# Patient Record
Sex: Male | Born: 1945 | Race: White | Hispanic: No | Marital: Married | Smoking: Former smoker
Health system: Southern US, Community
[De-identification: ages and names within clinical notes are randomized; demographics above are authoritative.]

## PROBLEM LIST (undated history)

## (undated) DIAGNOSIS — F419 Anxiety disorder, unspecified: Secondary | ICD-10-CM

## (undated) DIAGNOSIS — Z8616 Personal history of COVID-19: Secondary | ICD-10-CM

## (undated) DIAGNOSIS — K4090 Unilateral inguinal hernia, without obstruction or gangrene, not specified as recurrent: Secondary | ICD-10-CM

## (undated) DIAGNOSIS — I440 Atrioventricular block, first degree: Secondary | ICD-10-CM

## (undated) DIAGNOSIS — D539 Nutritional anemia, unspecified: Secondary | ICD-10-CM

## (undated) DIAGNOSIS — R9431 Abnormal electrocardiogram [ECG] [EKG]: Secondary | ICD-10-CM

## (undated) DIAGNOSIS — K219 Gastro-esophageal reflux disease without esophagitis: Secondary | ICD-10-CM

## (undated) DIAGNOSIS — M109 Gout, unspecified: Secondary | ICD-10-CM

## (undated) DIAGNOSIS — I779 Disorder of arteries and arterioles, unspecified: Secondary | ICD-10-CM

## (undated) DIAGNOSIS — J309 Allergic rhinitis, unspecified: Secondary | ICD-10-CM

## (undated) DIAGNOSIS — F32A Depression, unspecified: Secondary | ICD-10-CM

## (undated) DIAGNOSIS — K649 Unspecified hemorrhoids: Secondary | ICD-10-CM

## (undated) DIAGNOSIS — Z9289 Personal history of other medical treatment: Secondary | ICD-10-CM

## (undated) DIAGNOSIS — R0600 Dyspnea, unspecified: Secondary | ICD-10-CM

## (undated) DIAGNOSIS — M1990 Unspecified osteoarthritis, unspecified site: Secondary | ICD-10-CM

## (undated) DIAGNOSIS — K449 Diaphragmatic hernia without obstruction or gangrene: Secondary | ICD-10-CM

## (undated) DIAGNOSIS — R7989 Other specified abnormal findings of blood chemistry: Secondary | ICD-10-CM

## (undated) DIAGNOSIS — T7840XA Allergy, unspecified, initial encounter: Secondary | ICD-10-CM

## (undated) DIAGNOSIS — R002 Palpitations: Secondary | ICD-10-CM

## (undated) DIAGNOSIS — I480 Paroxysmal atrial fibrillation: Secondary | ICD-10-CM

## (undated) DIAGNOSIS — R0609 Other forms of dyspnea: Secondary | ICD-10-CM

## (undated) DIAGNOSIS — F1010 Alcohol abuse, uncomplicated: Secondary | ICD-10-CM

## (undated) DIAGNOSIS — I10 Essential (primary) hypertension: Secondary | ICD-10-CM

## (undated) DIAGNOSIS — N400 Enlarged prostate without lower urinary tract symptoms: Secondary | ICD-10-CM

## (undated) DIAGNOSIS — I2510 Atherosclerotic heart disease of native coronary artery without angina pectoris: Secondary | ICD-10-CM

## (undated) DIAGNOSIS — R740 Nonspecific elevation of levels of transaminase and lactic acid dehydrogenase [LDH]: Secondary | ICD-10-CM

## (undated) DIAGNOSIS — H269 Unspecified cataract: Secondary | ICD-10-CM

## (undated) DIAGNOSIS — K5790 Diverticulosis of intestine, part unspecified, without perforation or abscess without bleeding: Secondary | ICD-10-CM

## (undated) DIAGNOSIS — F4310 Post-traumatic stress disorder, unspecified: Secondary | ICD-10-CM

## (undated) DIAGNOSIS — F329 Major depressive disorder, single episode, unspecified: Secondary | ICD-10-CM

## (undated) DIAGNOSIS — R7401 Elevation of levels of liver transaminase levels: Secondary | ICD-10-CM

## (undated) DIAGNOSIS — R195 Other fecal abnormalities: Secondary | ICD-10-CM

## (undated) HISTORY — DX: Other forms of dyspnea: R06.09

## (undated) HISTORY — DX: Gout, unspecified: M10.9

## (undated) HISTORY — DX: Essential (primary) hypertension: I10

## (undated) HISTORY — DX: Post-traumatic stress disorder, unspecified: F43.10

## (undated) HISTORY — PX: ESOPHAGOGASTRODUODENOSCOPY: SHX1529

## (undated) HISTORY — DX: Palpitations: R00.2

## (undated) HISTORY — PX: EYE SURGERY: SHX253

## (undated) HISTORY — DX: Depression, unspecified: F32.A

## (undated) HISTORY — PX: KNEE ARTHROSCOPY: SUR90

## (undated) HISTORY — DX: Paroxysmal atrial fibrillation: I48.0

## (undated) HISTORY — DX: Nonspecific elevation of levels of transaminase and lactic acid dehydrogenase (ldh): R74.0

## (undated) HISTORY — PX: LAPAROSCOPIC CHOLECYSTECTOMY: SUR755

## (undated) HISTORY — DX: Unspecified hemorrhoids: K64.9

## (undated) HISTORY — DX: Diaphragmatic hernia without obstruction or gangrene: K44.9

## (undated) HISTORY — DX: Personal history of other medical treatment: Z92.89

## (undated) HISTORY — DX: Elevation of levels of liver transaminase levels: R74.01

## (undated) HISTORY — DX: Nutritional anemia, unspecified: D53.9

## (undated) HISTORY — DX: Gastro-esophageal reflux disease without esophagitis: K21.9

## (undated) HISTORY — DX: Anxiety disorder, unspecified: F41.9

## (undated) HISTORY — DX: Abnormal electrocardiogram (ECG) (EKG): R94.31

## (undated) HISTORY — PX: HERNIA REPAIR: SHX51

## (undated) HISTORY — DX: Unspecified cataract: H26.9

## (undated) HISTORY — DX: Alcohol abuse, uncomplicated: F10.10

## (undated) HISTORY — DX: Dyspnea, unspecified: R06.00

## (undated) HISTORY — DX: Other specified abnormal findings of blood chemistry: R79.89

## (undated) HISTORY — DX: Allergy, unspecified, initial encounter: T78.40XA

## (undated) HISTORY — DX: Diverticulosis of intestine, part unspecified, without perforation or abscess without bleeding: K57.90

## (undated) HISTORY — DX: Disorder of arteries and arterioles, unspecified: I77.9

## (undated) HISTORY — DX: Allergic rhinitis, unspecified: J30.9

## (undated) HISTORY — DX: Other fecal abnormalities: R19.5

## (undated) HISTORY — DX: Major depressive disorder, single episode, unspecified: F32.9

## (undated) HISTORY — PX: KNEE SURGERY: SHX244

---

## 2000-02-09 ENCOUNTER — Other Ambulatory Visit: Admission: RE | Admit: 2000-02-09 | Discharge: 2000-02-09 | Payer: Self-pay | Admitting: Urology

## 2002-10-21 ENCOUNTER — Encounter: Payer: Self-pay | Admitting: Gastroenterology

## 2002-11-26 ENCOUNTER — Encounter: Payer: Self-pay | Admitting: Gastroenterology

## 2002-12-12 HISTORY — PX: CHOLECYSTECTOMY: SHX55

## 2005-08-29 ENCOUNTER — Ambulatory Visit (HOSPITAL_COMMUNITY): Admission: RE | Admit: 2005-08-29 | Discharge: 2005-08-29 | Payer: Self-pay | Admitting: Family Medicine

## 2005-08-31 ENCOUNTER — Encounter (INDEPENDENT_AMBULATORY_CARE_PROVIDER_SITE_OTHER): Payer: Self-pay | Admitting: *Deleted

## 2005-08-31 ENCOUNTER — Encounter (INDEPENDENT_AMBULATORY_CARE_PROVIDER_SITE_OTHER): Payer: Self-pay | Admitting: Specialist

## 2005-08-31 ENCOUNTER — Ambulatory Visit (HOSPITAL_COMMUNITY): Admission: RE | Admit: 2005-08-31 | Discharge: 2005-08-31 | Payer: Self-pay | Admitting: Surgery

## 2005-12-30 ENCOUNTER — Emergency Department (HOSPITAL_COMMUNITY): Admission: EM | Admit: 2005-12-30 | Discharge: 2005-12-31 | Payer: Self-pay | Admitting: Emergency Medicine

## 2007-01-25 ENCOUNTER — Encounter: Admission: RE | Admit: 2007-01-25 | Discharge: 2007-01-25 | Payer: Self-pay | Admitting: Orthopedic Surgery

## 2007-03-08 ENCOUNTER — Encounter: Admission: RE | Admit: 2007-03-08 | Discharge: 2007-04-10 | Payer: Self-pay | Admitting: Orthopedic Surgery

## 2007-10-29 ENCOUNTER — Encounter: Admission: RE | Admit: 2007-10-29 | Discharge: 2007-12-03 | Payer: Self-pay | Admitting: Family Medicine

## 2009-04-11 ENCOUNTER — Emergency Department (HOSPITAL_COMMUNITY): Admission: EM | Admit: 2009-04-11 | Discharge: 2009-04-11 | Payer: Self-pay | Admitting: Emergency Medicine

## 2009-10-15 ENCOUNTER — Encounter (INDEPENDENT_AMBULATORY_CARE_PROVIDER_SITE_OTHER): Payer: Self-pay | Admitting: *Deleted

## 2010-06-18 ENCOUNTER — Encounter (INDEPENDENT_AMBULATORY_CARE_PROVIDER_SITE_OTHER): Payer: Self-pay | Admitting: *Deleted

## 2010-08-09 ENCOUNTER — Ambulatory Visit: Payer: Self-pay | Admitting: Gastroenterology

## 2010-08-09 DIAGNOSIS — R195 Other fecal abnormalities: Secondary | ICD-10-CM

## 2010-08-09 DIAGNOSIS — D539 Nutritional anemia, unspecified: Secondary | ICD-10-CM | POA: Insufficient documentation

## 2010-08-09 DIAGNOSIS — R740 Nonspecific elevation of levels of transaminase and lactic acid dehydrogenase [LDH]: Secondary | ICD-10-CM

## 2010-08-09 LAB — CONVERTED CEMR LAB
ALT: 54 units/L — ABNORMAL HIGH (ref 0–53)
AST: 53 units/L — ABNORMAL HIGH (ref 0–37)
Albumin: 4.3 g/dL (ref 3.5–5.2)
Bilirubin, Direct: 0.2 mg/dL (ref 0.0–0.3)
INR: 1.1 — ABNORMAL HIGH (ref 0.8–1.0)
Total Protein: 7.3 g/dL (ref 6.0–8.3)

## 2010-08-12 ENCOUNTER — Telehealth: Payer: Self-pay | Admitting: Gastroenterology

## 2010-08-12 ENCOUNTER — Ambulatory Visit (HOSPITAL_COMMUNITY)
Admission: RE | Admit: 2010-08-12 | Discharge: 2010-08-12 | Payer: Self-pay | Source: Home / Self Care | Admitting: Gastroenterology

## 2010-08-12 LAB — HM COLONOSCOPY: HM Colonoscopy: ABNORMAL

## 2010-08-31 ENCOUNTER — Telehealth: Payer: Self-pay | Admitting: Gastroenterology

## 2010-09-07 ENCOUNTER — Ambulatory Visit: Payer: Self-pay | Admitting: Gastroenterology

## 2010-09-14 ENCOUNTER — Encounter: Payer: Self-pay | Admitting: Gastroenterology

## 2010-11-01 ENCOUNTER — Encounter
Admission: RE | Admit: 2010-11-01 | Discharge: 2010-12-09 | Payer: Self-pay | Source: Home / Self Care | Attending: Orthopedic Surgery | Admitting: Orthopedic Surgery

## 2010-12-16 ENCOUNTER — Encounter
Admission: RE | Admit: 2010-12-16 | Discharge: 2011-01-11 | Payer: Self-pay | Source: Home / Self Care | Attending: Orthopedic Surgery | Admitting: Orthopedic Surgery

## 2011-01-11 NOTE — Miscellaneous (Signed)
Summary: gi medication  Clinical Lists Changes  Medications: Added new medication of OMEPRAZOLE 20 MG  CPDR (OMEPRAZOLE) 1 each day 30 minutes before meal - Signed Rx of OMEPRAZOLE 20 MG  CPDR (OMEPRAZOLE) 1 each day 30 minutes before meal;  #30 x 11;  Signed;  Entered by: Eual Fines RN;  Authorized by: Meryl Dare MD [facility];  Method used: Electronically to [facility][id number]*, [address] [place], [place], [place]  [address], Ph: [phone] or [phone], Fax: [phone] Observations: Added new observation of ALLERGY REV: Done (09/07/2010 14:52)    Prescriptions: OMEPRAZOLE 20 MG  CPDR (OMEPRAZOLE) 1 each day 30 minutes before meal  #30 x 11   Entered by:   Eual Fines RN   Authorized by:   Meryl Dare MD [facility]   Signed by:   Eual Fines RN on 09/07/2010   Method used:   Electronically to        [facility] [phone]* (retail)       [address] [place], [place]  [address]       Ph: [phone] or [phone]       Fax: [phone]   RxID:   [id number]

## 2011-01-11 NOTE — Procedures (Signed)
Summary: Colonoscopy  Patient: Joseph Larsen Note: All result statuses are Final unless otherwise noted.  Tests: (1) Colonoscopy (COL)   COL Colonoscopy           DONE     [facility]     [address]     [place], [place]  [address]           COLONOSCOPY PROCEDURE REPORT     PATIENT:  [name], [name]  MR#:  [id number]     BIRTHDATE:  1946-05-02, 64 yrs. old  GENDER:  male     ENDOSCOPIST:  Judie Petit T. Russella Dar, MD, [facility]           PROCEDURE DATE:  09/07/2010     PROCEDURE:  Colonoscopy 213[address]     ASA CLASS:  Class II     INDICATIONS:  1) heme positive stool     MEDICATIONS:   Fentanyl 100 mcg IV, Versed 9 mg IV     DESCRIPTION OF PROCEDURE:   After the risks benefits and     alternatives of the procedure were thoroughly explained, informed     consent was obtained.  Digital rectal exam was performed and     revealed no abnormalities.   The LB [id number]-[id number] endoscope     was introduced through the anus and advanced to the cecum, which     was identified by both the appendix and ileocecal valve, without     limitations.  The quality of the prep was excellent, using     MoviPrep.  The instrument was then slowly withdrawn as the colon     was fully examined.     <<PROCEDUREIMAGES>>     FINDINGS:  Mild diverticulosis was found in the ascending colon.     Moderate diverticulosis was found in the sigmoid colon. A normal     appearing cecum, ileocecal valve, and appendiceal orifice were     identified. The hepatic flexure, transverse, splenic flexure,     descending colon, and rectum appeared unremarkable.   Retroflexed     views in the rectum revealed internal hemorrhoids, small.  The     time to cecum =  2  minutes. The scope was then withdrawn (time =     8.67  min) from the patient and the procedure completed.     COMPLICATIONS:  None           ENDOSCOPIC IMPRESSION:     1) Mild diverticulosis in the ascending colon     2) Moderate diverticulosis in the sigmoid colon     3)  Internal hemorrhoids           RECOMMENDATIONS:     1) High fiber diet with liberal fluid intake.     2) Continue current colorectal screening for "routine risk"     patients with a repeat colonoscopy in 10 years.           Venita Lick. Russella Dar, MD, [facility]           CC: Rudi Heap, MD           n.     Rosalie DoctorVenita Lick. Stark at 09/07/2010 02:21 PM           Aquilla Hacker, [phone]  Note: An exclamation mark (!) indicates a result that was not dispersed into the flowsheet. Document Creation Date: 09/07/2010 2:22 PM _______________________________________________________________________  (1) Order result status: Final Collection or observation date-time: 09/07/2010 14:18 Requested date-time:  Receipt date-time:  Reported date-time:  Referring Physician:   Ordering Physician: Claudette Head [phone]) Specimen Source:  Source: Launa Grill Order Number: [id number] Lab site:   Appended Document: Colonoscopy    Clinical Lists Changes  Observations: Added new observation of COLONNXTDUE: 08/2020 (09/07/2010 15:59)      Appended Document: Colonoscopy     Procedures Next Due Date:    Colonoscopy: 08/2020

## 2011-01-11 NOTE — Letter (Signed)
Summary: New Patient letter  [facility]  [address] [place], [place] [address]   Phone: [phone]  Fax: [phone]       06/18/2010 MRN: [id number]  [facility] [address] [place], [place]  [address]  [place]  Dear Ms. Bouknight,  Welcome to the Gastroenterology Division at [facility].    You are scheduled to see Dr.  Claudette Head on August 09, 2010 at 10:00am on the 3rd floor at [facility], [address]  We ask that you try to arrive at our office 15 minutes prior to your appointment time to allow for check-in.  We would like you to complete the enclosed self-administered evaluation form prior to your visit and bring it with you on the day of your appointment.  We will review it with you.  Also, please bring a complete list of all your medications or, if you prefer, bring the medication bottles and we will list them.  Please bring your insurance card so that we may make a copy of it.  If your insurance requires a referral to see a specialist, please bring your referral form from your primary care physician.  Co-payments are due at the time of your visit and may be paid by cash, check or credit card.     Your office visit will consist of a consult with your physician (includes a physical exam), any laboratory testing he/she may order, scheduling of any necessary diagnostic testing (e.g. x-ray, ultrasound, CT-scan), and scheduling of a procedure (e.g. Endoscopy, Colonoscopy) if required.  Please allow enough time on your schedule to allow for any/all of these possibilities.    If you cannot keep your appointment, please call [phone] to cancel or reschedule prior to your appointment date.  This allows [place] the opportunity to schedule an appointment for another patient in need of care.  If you do not cancel or reschedule by 5 p.m. the business day prior to your appointment date, you will be charged a $50.00 late cancellation/no-show fee.    Thank you for  choosing [facility] for your medical needs.  We appreciate the opportunity to care for you.  Please visit [place] at our website  to learn more about our practice.                     Sincerely,                                                             The Gastroenterology Division

## 2011-01-11 NOTE — Progress Notes (Signed)
Summary: speak to nurse  Phone Note Call from Patient Call back at Home Phone [phone]   Caller: Patient Call For: Dr Russella Dar Reason for Call: Talk to Nurse Summary of Call: Patient want's to speak to nurse regarding prep instructions Initial call taken by: Tawni Levy,  August 31, 2010 1:16 PM  Follow-up for Phone Call        Pt states he was wondering if he could take all of his medicines the day before his procedure and the day of the procedure. I told him that we did not restrict any of his medicines before the procedure and he can take them like he normally does. Pt agreed and states he was just concerned b/c the Movi prep box states there are some medicines that you cannot take while doing this prep and he wanted to make sure he could take his medications. Told pt he can take them.  Follow-up by: Christie Nottingham CMA Duncan Dull),  August 31, 2010 2:03 PM

## 2011-01-11 NOTE — Procedures (Signed)
Summary: Gastroenterology  [facility]  MR#:  [id number] Page #  [facility]   GASTROENTEROLOGY OFFICE NOTE  NAME:  Joseph Larsen, Joseph Larsen   OFFICE NO:  [id number]  DATE:  10/21/02  DOB:  [birth year]  REFERRING PHYSICIAN:  Rudi Heap, M.D.   REASON FOR REFERRAL:  Colorectal cancer screening and epigastric pain.    HISTORY OF PRESENT ILLNESS:  The patient is a very nice 65 year old white male referred through the courtesy of Dr. Vernon Prey.  He relates intermittent problems with an aching epigastric pain that bothers him intermittently. It is not clearly related to meals.  He has taken Advil recently for right elbow pain, but he discontinued this about three weeks ago.  He does take aspirin on a daily basis.  He drinks a modest amount of alcohol each day.  He notes no change in bowel habits, melena, hematochezia, change in stool caliber or rectal pain.  No family history of colon cancer, colon polyps, or inflammatory bowel disease.    REVIEW OF SYSTEMS:  As per the hand written diagnostic evaluation form.    PAST MEDICAL HISTORY:  Hypertension, allergic rhinitis, BPH, diverticulosis, history of benign prostate nodule.    ALLERGIES:  Penicillin leading to itching.  No latex allergy.    SOCIAL HISTORY:  Denies cigarette smoking.  He drinks a modest amount of alcohol on a daily basis.  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    MEDICATIONS:   Avalide 300/12.5 one q.  day, Tiazac 240 mg q. day, atenolol 100 mg q. day, and enteric coated aspirin q. day.    PHYSICAL EXAMINATION: No acute distress.  Height: 5 feet, 11 inches.  Weight: 185 pounds.  Blood pressure: 122/80.  Pulse: 58 and regular.  HEENT: Anicteric sclerae.  Oropharynx is clear.  CHEST: Clear to auscultation and percussion.  CARDIAC: Regular rate and rhythm without murmurs.  ABDOMEN: Soft, nontender, nondistended, normoactive bowel sounds.  No palpable organomegaly, masses, or hernias.  RECTAL EXAM: Deferred to time of  colonoscopy.  The patient states he had recent rectal exam preformed by Dr. Christell Constant and this was unremarkable.  Hemoccults from that office evaluation are pending.  EXTREMITIES: Without clubbing, cyanosis, or edema.  NEUROLOGIC: Alert and oriented times 3.    ASSESSMENT AND PLAN:   1.  Average risk for colorectal cancer. Risks, benefits, and alternative to colonoscopy and possible polypectomy discussed with the patient and he consents to proceed.   2.  Intermittent epigastric pain: Rule out ulcer disease, gastritis, and gastroesophageal reflux disease.  Less likely neoplastic process.  Risks, benefits, and alternatives to upper endoscopy were discussed with the patient and he consents to proceed.        Venita Lick. Russella Dar, M.D., F.A.C.G.  [id number] cc:  Rudi Heap, M.D.  D:  10/21/02; T:  ; Job [id number]

## 2011-01-11 NOTE — Procedures (Signed)
Summary: EGD   EGD  Procedure date:  11/26/2002  Findings:      Hiatal Hernia Location: [facility]    EGD  Procedure date:  11/26/2002  Findings:      Hiatal Hernia Location: [facility]   Patient Name: Joseph Larsen, Joseph Larsen MRN:  Procedure Procedures: Panendoscopy (EGD) CPT: 43235.  Personnel: Endoscopist: Venita Lick. Russella Dar, MD, [facility].  Referred By: Rudi Heap, MD.  Exam Location: Exam performed in Outpatient Clinic. Outpatient  Patient Consent: Procedure, Alternatives, Risks and Benefits discussed, consent obtained, from patient. Consent was obtained by the RN.  Indications Symptoms: Abdominal pain, location: epigastric.  History  Pre-Exam Physical: Performed Nov 26, 2002  Entire physical exam was normal.  Exam Exam Info: Maximum depth of insertion Duodenum, intended Duodenum. Vocal cords not visualized. Gastric retroflexion performed. ASA Classification: II. Tolerance: good.  Sedation Meds: Patient assessed and found to be appropriate for moderate (conscious) sedation. Residual sedation present from prior procedure today.  Monitoring: BP and pulse monitoring done. Oximetry used. Supplemental O2 given  Findings Normal: Proximal Esophagus to Distal Esophagus.  HIATAL HERNIA: Regular, 3 cms. in length. ICD9: Hernia, Hiatal: 553.3. Normal: Fundus to Duodenal 2nd Portion.   Assessment  Diagnoses: 553.3: Hernia, Hiatal.   Events  Unplanned Intervention: No unplanned interventions were required.  Unplanned Events: There were no complications. Plans Medication(s): Continue current medications. PPI: Pantoprazole/Protonix 40 mg QAM prn,   Patient Education: Patient given standard instructions for: Hiatal Hernia. Reflux.  Disposition: After procedure patient sent to recovery. After recovery patient sent home.  Scheduling: Primary Care Lucine Bilski, to Rudi Heap, MD,  Office Visit, to Venita Lick. Russella Dar, MD, [facility], around Dec 27, 2002.    This report was created from the original endoscopy report, which was reviewed and signed by the above listed endoscopist.    cc: Rudi Heap, MD

## 2011-01-11 NOTE — Assessment & Plan Note (Signed)
Summary: CONSULT BEFORE COLON DX MELENA/SCREEN FOR ENDO/YF   History of Present Illness Visit Type: Initial Consult Primary GI MD: Elie Goody MD [facility] Primary Provider: Rudi Heap, MD Requesting Provider: Rudi Heap, MD Chief Complaint: Patient here for consult endo/colon. He states that back in June while on a golf trip, he experienced 1 week of dark black stools as well as periumbilical abdominal pain. He states that both of these symptoms have no subsided and has no current GI complaints. History of Present Illness:   This is a 65 year old male, who relates a history of black, tarry stool for several days in June, while on a golf trip. He noted epigastric pain for several days associated with reflux symptoms and then noted black stool occurring once a day for about 5 days. He was evaluated in Dr. Kathi Der office and Hemoccult positive stool was noted. CBC with hemoglobin of 17.9, with an MCV of 102.9, and a slightly decreased platelet count at 1 known31,000. Liver function tests are mildly elevated with an AST of 49 and a GGT 111.  He was drinking beer and liquor on his golf trip and he takes indomethacin frequently for joint pains, and gout. His gastrointestinal complaints have completely resolved.   GI Review of Systems      Denies abdominal pain, acid reflux, belching, bloating, chest pain, dysphagia with liquids, dysphagia with solids, heartburn, loss of appetite, nausea, vomiting, vomiting blood, weight loss, and  weight gain.        Denies anal fissure, black tarry stools, change in bowel habit, constipation, diarrhea, diverticulosis, fecal incontinence, heme positive stool, hemorrhoids, irritable bowel syndrome, jaundice, light color stool, liver problems, rectal bleeding, and  rectal pain. Preventive Screening-Counseling & Management  Alcohol-Tobacco     Smoking Status: quit      Drug Use:  no.     Current Medications (verified): 1)  Diazepam 10 Mg Tabs (Diazepam) ....  Take 1 Tablet By Mouth Four Times Daily 2)  Atenolol 100 Mg Tabs (Atenolol) .... Take 1/2 Tablet By Mouth Once Daily 3)  Diovan 320 Mg Tabs (Valsartan) .... Take 1/2 Tablet By Mouth Qam 4)  Hydrochlorothiazide 25 Mg Tabs (Hydrochlorothiazide) .... Take 1 Tablet By Mouth Once Daily 5)  Amlodipine Besylate 5 Mg Tabs (Amlodipine Besylate) .... Take 1 Tablet By Mouth Once Daily 6)  Aspirin 81 Mg Tbec (Aspirin) .... Take 1 Tablet By Mouth Once A Day 7)  Vitamin D 1000 Unit Tabs (Cholecalciferol) .... Take 1 Tablet By Mouth Once Daily 8)  Fish Oil 1000 Mg Caps (Omega-3 Fatty Acids) .... Take 4 Capsules By Mouth Once Daily 9)  Indomethacin Cr 75 Mg Cr-Caps (Indomethacin) .... Take 2 Capsules By Mouth Every 12 Hours 10)  Flunisolide 0.025 % Soln (Flunisolide) .... Use 2 Sprays in Each Nostril Two Times A Day  Allergies (verified): 1)  ! Pcn 2)  ! Vasotec  Past History:  Past Medical History: GERD Hypertension Anxiety Disorder Depression Allergic rhinitis BPH Diverticulitis Hemorrhoids Hiatal Hernia Gout  Past Surgical History: Laparoscopic Cholecystectomy Left knee surgery  Family History: No FH of Colon Cancer:  Social History: Occupation: Retired Patient is a former smoker. -stopped 1983 Alcohol Use - yes-4 beers daily Illicit Drug Use - no Smoking Status:  quit Drug Use:  no  Review of Systems       The patient complains of depression-new and sleeping problems.         The pertinent positives and negatives are noted as above and in  the HPI. All other ROS were reviewed and were negative.   Vital Signs:  Patient profile:   65 year old male Height:      71 inches Weight:      203.38 pounds BMI:     28.47 BSA:     2.13 Pulse rate:   64 / minute Pulse rhythm:   regular BP sitting:   140 / 82  (left arm)  Vitals Entered By: Lamona Curl CMA Duncan Dull) (August 09, 2010 9:38 AM)  Physical Exam  General:  Well developed, well nourished, no acute distress. Head:   Normocephalic and atraumatic. Eyes:  PERRLA, no icterus. Mouth:  No deformity or lesions, dentition normal. Lungs:  Clear throughout to auscultation. Heart:  Regular rate and rhythm; no murmurs, rubs,  or bruits. Abdomen:  Soft, nontender and nondistended. No masses, hepatosplenomegaly or hernias noted. Normal bowel sounds. Rectal:  deferred until time of colonoscopy.   Pulses:  Normal pulses noted. Extremities:  No clubbing, cyanosis, edema or deformities noted. Neurologic:  Alert and  oriented x4;  grossly normal neurologically. Cervical Nodes:  No significant cervical adenopathy. Inguinal Nodes:  No significant inguinal adenopathy. Psych:  Alert and cooperative. Normal mood and affect.  Impression & Recommendations:  Problem # 1:  NONSPECIFIC ABNORMAL FINDING IN STOOL CONTENTS (ICD-792.1) Hemoccult-Positive stool with a history of melena and epigastric pain. NSAID and aspirin usage on a regular basis. Rule out ulcer disease, colorectal neoplasms, or other disorders. I advised him to avoid indomethacin, except for gout attacks, and to use Advil instead. The risks, benefits and alternatives to colonoscopy with possible biopsy and possible polypectomy were discussed with the patient and they consent to proceed. The procedure will be scheduled electively. The risks, benefits and alternatives to endoscopy with possible biopsy and possible dilation were discussed with the patient and they consent to proceed. The procedure will be scheduled electively. Orders: Colonoscopy (Colon)  Problem # 2:  NONSPEC ELEVATION OF LEVELS OF TRANSAMINASE/LDH (ICD-790.4) Mildly elevated AST and GGT. Repeat liver function tests today. Screen for chronic hepatitis. These changes may be alcohol-related. 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  Possible alcohol affect. Rule out B12 and folate deficiency. Further evaluation per Dr. Christell Constant.  Patient Instructions: 1)  Get your labs drawn today in the basement. 2)  Colonoscopy brochure given.  3)  Upper Endoscopy brochure given.  4)  Copy sent to : Rudi Heap, MD 5)  The medication list was reviewed and reconciled.  All changed / newly prescribed medications were explained.  A complete medication list was provided to the patient / caregiver.  Appended Document: Orders Update    Clinical Lists Changes  Orders: Added new Test order of Colon/Endo (Colon/Endo) - Signed

## 2011-01-11 NOTE — Progress Notes (Signed)
Summary: Needs Moviprep sent to RX  Phone Note Call from Patient   Call For: Karolee Ohs Reason for Call: Talk to Nurse Summary of Call: Moviprep medicine is not [facility] has been to pharmacy twice. Initial call taken by: Leanor Kail [facility],  August 12, 2010 11:40 AM  Follow-up for Phone Call        Rx was sent to pts pharmacy.  Follow-up by: Christie Nottingham CMA Duncan Dull),  August 12, 2010 1:54 PM    New/Updated Medications: MOVIPREP 100 GM  SOLR (PEG-KCL-NACL-NASULF-NA ASC-C) As per prep instructions. Prescriptions: MOVIPREP 100 GM  SOLR (PEG-KCL-NACL-NASULF-NA ASC-C) As per prep instructions.  #1 x 0   Entered by:   Christie Nottingham CMA (AAMA)   Authorized by:   Meryl Dare MD [facility]   Signed by:   Christie Nottingham CMA (AAMA) on 08/12/2010   Method used:   Electronically to        [facility] [phone]* (retail)       [address] [place], [place]  [address]       Ph: [phone] or [phone]       Fax: [phone]   RxID:   [id number]

## 2011-01-11 NOTE — Procedures (Signed)
Summary: Colonoscopy   Colonoscopy  Procedure date:  11/26/2002  Findings:      Results: Hemorrhoids.     Results: Diverticulosis.       Location:  [facility].    Procedures Next Due Date:    Colonoscopy: 12/2009  Colonoscopy  Procedure date:  11/26/2002  Findings:      Results: Hemorrhoids.     Results: Diverticulosis.       Location:  [facility].    Procedures Next Due Date:    Colonoscopy: 12/2009 Patient Name: Joseph Larsen, Joseph Larsen MRN:  Procedure Procedures: Colonoscopy CPT: 52841.  Personnel: Endoscopist: Venita Lick. Russella Dar, MD, [facility].  Referred By: Rudi Heap, MD.  Exam Location: Exam performed in Outpatient Clinic. Outpatient  Patient Consent: Procedure, Alternatives, Risks and Benefits discussed, consent obtained, from patient. Consent was obtained by the RN.  Indications  Average Risk Screening Routine.  History  Pre-Exam Physical: Performed Nov 26, 2002. Entire physical exam was normal.  Exam Exam: Extent of exam reached: Cecum, extent intended: Cecum.  The cecum was identified by appendiceal orifice and IC valve. Colon retroflexion performed. ASA Classification: II. Tolerance: good.  Monitoring: Pulse and BP monitoring, Oximetry used. Supplemental O2 given.  Colon Prep Used Golytely for colon prep. Prep results: good.  Sedation Meds: Patient assessed and found to be appropriate for moderate (conscious) sedation. Fentanyl 100 mcg. given IV. Versed 10 mg. given IV.  Findings - DIVERTICULOSIS: Sigmoid Colon. Not bleeding. ICD9: Diverticulosis: 562.10.  NORMAL EXAM: Cecum to Descending Colon.  HEMORRHOIDS: Internal. Size: Small. Not bleeding. Not thrombosed. ICD9: Hemorrhoids, Internal: 455.0.   Assessment  Diagnoses: 562.10: Diverticulosis.  455.0: Hemorrhoids, Internal.   Events  Unplanned Interventions: No intervention was required.  Unplanned Events: There were no complications. Plans Medication  Plan: Continue current medications.  Patient Education: Patient given standard instructions for: Diverticulosis. Hemorrhoids.  Disposition: After procedure patient sent to recovery. After recovery patient sent home.  Scheduling/Referral: Colonoscopy, to [facility] T. Russella Dar, MD, [facility], around Nov 26, 2009.  Primary Care Provider, to Rudi Heap, MD,    This report was created from the original endoscopy report, which was reviewed and signed by the above listed endoscopist.    cc: Rudi Heap, MD

## 2011-01-11 NOTE — Letter (Signed)
Summary: Patient [facility] Biopsy Results  [facility]  [address] [place], [place] [address]   Phone: [phone]  Fax: [phone]        September 14, 2010 MRN: [id number]    KRISTOFFER BALA [address] [place], [place]  [address]    Dear Mr. Campanaro,  I am pleased to inform you that the biopsies taken during your recent endoscopic examination did not show any evidence of cancer upon pathologic examination. The gastric biopsies showed intestinal metaplasia.   Continue with the treatment plan as outlined on the day of your      exam.  You should have a repeat endoscopic examination for this problem              in 5 years.  Please call us if you are having persistent problems or have questions about your condition that have not been fully answered at this time.  Sincerely,  Meryl Dare MD [facility]  This letter has been electronically signed by your physician.  Appended Document: Patient Notice-Endo Biopsy Results letter mailed

## 2011-01-11 NOTE — Op Note (Signed)
Summary: Laparoscopic Cholecystectomy  NAME:  Joseph Larsen, Joseph Larsen                  ACCOUNT NO.:  [id number]   MEDICAL RECORD NO.:  [id number]          PATIENT TYPE:  AMB   LOCATION:  DAY                          FACILITY:  [facility]   PHYSICIAN:  Joseph Park. Daphine Deutscher, Joseph Larsen  DATE OF BIRTH:  [birth year]   DATE OF PROCEDURE:  08/31/2005  DATE OF DISCHARGE:                                 OPERATIVE REPORT   PREOPERATIVE DIAGNOSIS:  Cholecystitis.   POSTOPERATIVE DIAGNOSIS:  Acute cholecystitis.   PROCEDURE:  Laparoscopic cholecystectomy, intraoperative cholangiogram  (normal).   SURGEON:  Joseph Park. Daphine Larsen, M.D.   ASSISTANT:  Vikki Ports, M.D.   DESCRIPTION OF PROCEDURE:  Joseph Larsen was taken to room #1 on August 31, 2005 and given general anesthesia.  The abdomen was prepped with Betadine  and draped sterilely.  Access to the abdomen was gained through the Hasson  technique and umbilicus, entering without difficulty.  The abdomen was  insufflated.  The gallbladder was identified, and it was completely walled  off with omentum.  Three trocars were placed in the upper abdomen.  The  gallbladder was decompressed.  Then the adhesions were stripped away.  This  was all acute inflammatory change.  Calot's triangle head was identified,  and he had a very plump, large, inflamed node.  I was able to skeletonize  what appeared to be the cystic duct and cystic artery.  I triple clipped the  cystic artery, put a clip on what appeared to be the gallbladder.  The  patient has a family member who had a common duct injury, which was unknown  in etiology, whether it was due to any kind of hepatic anomalous anatomy.  I  went ahead and performed a cholangiogram because of his acute cholecystitis  and to further delineate the anatomy.  A Reddick catheter was inserted.  A C-  arm was obtained, although there was initially a delay.  After the  cholangiogram, the cystic duct was triply clipped and divided.   The  gallbladder was removed from the gallbladder bed with hook electrocautery.  It was very edematous and very intrahepatic.  Once detached, I cauterized  the gallbladder bed.  No bleeding or bile leaks were noted.  It was placed  in a bag and brought out through the umbilicus, although at that point, I  had to enlarge my incision to get it out.  This was then repaired with two  sutures of 0 Vicryl.  The area was irrigated.  No bleeding was noted.  The  wounds were injected with Marcaine and closed with 4-0 Vicryl with Benzoin  and Steri-Strips.  The patient seemed to tolerated the procedure well.  He  will be considered a candidate for discharge, and a prescription for  Percocet was written for pain.      Joseph Park Daphine Deutscher, Joseph Larsen  Electronically Signed    MBM/MEDQ  D:  08/31/2005  T:  08/31/2005  Job:  [id number]   cc:   Joseph Larsen, M.D.  Fax: [phone]        SP  Surgical Pathology - STATUS: Final             By: Windy Kalata,      Perform Date: 20Sep06 00:00  Ordered By: Arville Lime Date: 20Sep06 12:45  Facility: [facility]                              Department: CPATH  Service Report Text  [facility]   [address] [place], [place] [address]   [phone]    REPORT OF SURGICAL PATHOLOGY    Case #: [id number]   Patient Name: Joseph Larsen, Joseph Larsen   PID: [id number]   Pathologist: Joseph Server A. Delila Spence, Joseph Larsen   DOB/Age 04/27/1946 (Age: 65) Gender: M   Date Taken: 08/31/2005   Date Received: 08/31/2005    FINAL DIAGNOSIS    ***MICROSCOPIC EXAMINATION AND DIAGNOSIS***    GALLBLADDER: ACUTE CHOLECYSTITIS AND CHOLELITHIASIS.    gdt   Date Reported: 09/01/2005 Joseph Server A. Delila Spence, Joseph Larsen   *** Electronically Signed Out By EAA ***    Clinical information   Gallstones (as)    specimen(s) obtained   Gallbladder    Gross Description   Size/?Intact: Formalin fixed, disrupted (in five portions), 6 cm   in length x 3 cm in diameter  and includes a short segment of   cystic duct traversed by metallic clip.   Serosal surface: Whitish-gray to red-pink, smooth and   glistening.   Mucosa/Wall: Mucosa- Hyperemic; Wall- .1 to .3 cm in   thickness excluding subserosal fat that measures up to .3 cm.   Contents: None.   Cystic duct: Contains an ovoid black calculus with a fine   granular external surface that measures .4 x .3 x .3 cm.   Block Summary: Sections are submitted in two cassettes labeled A   and B. (JBM:glk 08-31-05)    gk/

## 2011-01-11 NOTE — Procedures (Signed)
Summary: Upper Endoscopy  Patient: Joseph Larsen Note: All result statuses are Final unless otherwise noted.  Tests: (1) Upper Endoscopy (EGD)   EGD Upper Endoscopy       DONE     [facility]     [address]     [place], [place]  [address]           ENDOSCOPY PROCEDURE REPORT           PATIENT:  [name], [name]  MR#:  [id number]     BIRTHDATE:  August 01, 1946, 64 yrs. old  GENDER:  male     ENDOSCOPIST:  Judie Petit T. Russella Dar, MD, [facility]           PROCEDURE DATE:  09/07/2010     PROCEDURE:  EGD with biopsy     ASA CLASS:  Class II     INDICATIONS:  epigastric pain, hemeoccult positive stool, melena           MEDICATIONS:  There was residual sedation effect present from     prior procedure, Fentanyl 25 mcg IV, Versed 1 mg IV     TOPICAL ANESTHETIC:  Exactacain Spray     DESCRIPTION OF PROCEDURE:   After the risks benefits and     alternatives of the procedure were thoroughly explained, informed     consent was obtained.  The [facility] GIF-H180 [id number] endoscope was     introduced through the mouth and advanced to the second portion of     the duodenum, without limitations.  The instrument was slowly     withdrawn as the mucosa was fully examined.     <<PROCEDUREIMAGES>>     Moderate gastritis was found in the total stomach. It was friable     and granular. There was a loss of gastric rugal folds. Multiple     biopsies were obtained and sent to pathology.  The esophagus and     gastroesophageal junction were completely normal in appearance.     The duodenal bulb was normal in appearance, as was the postbulbar     duodenum.  Retroflexed views revealed no abnormalities.  The scope     was then withdrawn from the patient and the procedure completed.           COMPLICATIONS:  None           ENDOSCOPIC IMPRESSION:     1) Moderate, diffuse gastritis           RECOMMENDATIONS:     1) Await pathology results     2) omeprazole 20mg  po qam, #30, 11 refills           Malcolm T. Russella Dar, MD,  Clementeen Graham           CC:  Rudi Heap, MD           n.     Rosalie DoctorVenita Lick. Stark at 09/07/2010 02:40 PM           Aquilla Hacker, [id number]  Note: An exclamation mark (!) indicates a result that was not dispersed into the flowsheet. Document Creation Date: 09/07/2010 2:42 PM _______________________________________________________________________  (1) Order result status: Final Collection or observation date-time: 09/07/2010 14:26 Requested date-time:  Receipt date-time:  Reported date-time:  Referring Physician:   Ordering Physician: Claudette Head [phone]) Specimen Source:  Source: Launa Grill Order Number: [id number] Lab site:   Appended Document: Upper Endoscopy     Procedures Next Due Date:    EGD: 08/2015

## 2011-01-11 NOTE — Letter (Signed)
Summary: [facility] Instructions  [facility]  [address] [place], [place] [address]   Phone: [phone]  Fax: [phone]       Joseph Larsen    [birth year]    MRN: [id number]        Procedure Day /Date: Tuesday September 27th, 2011     Arrival Time: 1:00pm     Procedure Time: 2:00pm     Location of Procedure:                    _ x_  [facility] (4th Floor)                        PREPARATION FOR COLONOSCOPY WITH MOVIPREP   Starting 5 days prior to your procedure 09/02/10 do not eat nuts, seeds, popcorn, corn, beans, peas,  salads, or any raw vegetables.  Do not take any fiber supplements (e.g. Metamucil, Citrucel, and Benefiber).  THE DAY BEFORE YOUR PROCEDURE         DATE: 09/06/10  DAY: Monday  1.  Drink clear liquids the entire day-NO SOLID FOOD  2.  Do not drink anything colored red or purple.  Avoid juices with pulp.  No orange juice.  3.  Drink at least 64 oz. (8 glasses) of fluid/clear liquids during the day to prevent dehydration and help the prep work efficiently.  CLEAR LIQUIDS INCLUDE: Water Jello Ice Popsicles Tea (sugar ok, no milk/cream) Powdered fruit flavored drinks Coffee (sugar ok, no milk/cream) Gatorade Juice: apple, white grape, white cranberry  Lemonade Clear bullion, consomm, broth Carbonated beverages (any kind) Strained chicken noodle soup Hard Candy                             4.  In the morning, mix first dose of MoviPrep solution:    Empty 1 Pouch A and 1 Pouch B into the disposable container    Add lukewarm drinking water to the top line of the container. Mix to dissolve    Refrigerate (mixed solution should be used within 24 hrs)  5.  Begin drinking the prep at 5:00 p.m. The MoviPrep container is divided by 4 marks.   Every 15 minutes drink the solution down to the next mark (approximately 8 oz) until the full liter is complete.   6.  Follow completed prep with 16 oz of clear liquid of your choice  (Nothing red or purple).  Continue to drink clear liquids until bedtime.  7.  Before going to bed, mix second dose of MoviPrep solution:    Empty 1 Pouch A and 1 Pouch B into the disposable container    Add lukewarm drinking water to the top line of the container. Mix to dissolve    Refrigerate  THE DAY OF YOUR PROCEDURE      DATE: 09/07/10 DAY: Tuesday  Beginning at 9:00 a.m. (5 hours before procedure):         1. Every 15 minutes, drink the solution down to the next mark (approx 8 oz) until the full liter is complete.  2. Follow completed prep with 16 oz. of clear liquid of your choice.    3. You may drink clear liquids until 12:00 (2 HOURS BEFORE PROCEDURE).   MEDICATION INSTRUCTIONS  Unless otherwise instructed, you should take regular prescription medications with a small sip of water   as early as possible the morning of your  procedure.         OTHER INSTRUCTIONS  You will need a responsible adult at least 64 years of age to accompany you and drive you home.   This person must remain in the waiting room during your procedure.  Wear loose fitting clothing that is easily removed.  Leave jewelry and other valuables at home.  However, you may wish to bring a book to read or  an iPod/MP3 player to listen to music as you wait for your procedure to start.  Remove all body piercing jewelry and leave at home.  Total time from sign-in until discharge is approximately 2-3 hours.  You should go home directly after your procedure and rest.  You can resume normal activities the  day after your procedure.  The day of your procedure you should not:   Drive   Make legal decisions   Operate machinery   Drink alcohol   Return to work  You will receive specific instructions about eating, activities and medications before you leave.    The above instructions have been reviewed and explained to me by   _______________________    I fully understand and can verbalize  these instructions _____________________________ Date _________

## 2011-04-29 NOTE — Op Note (Signed)
NAME:  ISAIS, KLIPFEL                  ACCOUNT NO.:  [id number]   MEDICAL RECORD NO.:  [id number]          PATIENT TYPE:  AMB   LOCATION:  DAY                          FACILITY:  [facility]   PHYSICIAN:  Thornton Park. Daphine Deutscher, MD  DATE OF BIRTH:  12/08/1946   DATE OF PROCEDURE:  08/31/2005  DATE OF DISCHARGE:                                 OPERATIVE REPORT   PREOPERATIVE DIAGNOSIS:  Cholecystitis.   POSTOPERATIVE DIAGNOSIS:  Acute cholecystitis.   PROCEDURE:  Laparoscopic cholecystectomy, intraoperative cholangiogram  (normal).   SURGEON:  Thornton Park. Daphine Deutscher, M.D.   ASSISTANT:  Vikki Ports, M.D.   DESCRIPTION OF PROCEDURE:  Mr. Bralley was taken to room #1 on August 31, 2005 and given general anesthesia.  The abdomen was prepped with Betadine  and draped sterilely.  Access to the abdomen was gained through the Hasson  technique and umbilicus, entering without difficulty.  The abdomen was  insufflated.  The gallbladder was identified, and it was completely walled  off with omentum.  Three trocars were placed in the upper abdomen.  The  gallbladder was decompressed.  Then the adhesions were stripped away.  This  was all acute inflammatory change.  Calot's triangle head was identified,  and he had a very plump, large, inflamed node.  I was able to skeletonize  what appeared to be the cystic duct and cystic artery.  I triple clipped the  cystic artery, put a clip on what appeared to be the gallbladder.  The  patient has a family member who had a common duct injury, which was unknown  in etiology, whether it was due to any kind of hepatic anomalous anatomy.  I  went ahead and performed a cholangiogram because of his acute cholecystitis  and to further delineate the anatomy.  A Reddick catheter was inserted.  A C-  arm was obtained, although there was initially a delay.  After the  cholangiogram, the cystic duct was triply clipped and divided.  The  gallbladder was removed from the  gallbladder bed with hook electrocautery.  It was very edematous and very intrahepatic.  Once detached, I cauterized  the gallbladder bed.  No bleeding or bile leaks were noted.  It was placed  in a bag and brought out through the umbilicus, although at that point, I  had to enlarge my incision to get it out.  This was then repaired with two  sutures of 0 Vicryl.  The area was irrigated.  No bleeding was noted.  The  wounds were injected with Marcaine and closed with 4-0 Vicryl with Benzoin  and Steri-Strips.  The patient seemed to tolerated the procedure well.  He  will be considered a candidate for discharge, and a prescription for  Percocet was written for pain.      Thornton Park Daphine Deutscher, MD  Electronically Signed    MBM/MEDQ  D:  08/31/2005  T:  08/31/2005  Job:  [id number]   cc:   Ernestina Penna, M.D.  Fax: [phone]

## 2011-12-14 DIAGNOSIS — R070 Pain in throat: Secondary | ICD-10-CM | POA: Diagnosis not present

## 2011-12-14 DIAGNOSIS — R05 Cough: Secondary | ICD-10-CM | POA: Diagnosis not present

## 2011-12-14 DIAGNOSIS — J029 Acute pharyngitis, unspecified: Secondary | ICD-10-CM | POA: Diagnosis not present

## 2012-05-14 DIAGNOSIS — R070 Pain in throat: Secondary | ICD-10-CM | POA: Diagnosis not present

## 2012-05-14 DIAGNOSIS — J069 Acute upper respiratory infection, unspecified: Secondary | ICD-10-CM | POA: Diagnosis not present

## 2012-07-24 DIAGNOSIS — Z0000 Encounter for general adult medical examination without abnormal findings: Secondary | ICD-10-CM | POA: Diagnosis not present

## 2012-07-24 DIAGNOSIS — I10 Essential (primary) hypertension: Secondary | ICD-10-CM | POA: Diagnosis not present

## 2012-07-25 DIAGNOSIS — R7989 Other specified abnormal findings of blood chemistry: Secondary | ICD-10-CM | POA: Diagnosis not present

## 2012-07-25 DIAGNOSIS — M109 Gout, unspecified: Secondary | ICD-10-CM | POA: Diagnosis not present

## 2012-07-25 DIAGNOSIS — I10 Essential (primary) hypertension: Secondary | ICD-10-CM | POA: Diagnosis not present

## 2012-07-25 DIAGNOSIS — E785 Hyperlipidemia, unspecified: Secondary | ICD-10-CM | POA: Diagnosis not present

## 2012-07-25 DIAGNOSIS — R5381 Other malaise: Secondary | ICD-10-CM | POA: Diagnosis not present

## 2012-07-25 DIAGNOSIS — Z79899 Other long term (current) drug therapy: Secondary | ICD-10-CM | POA: Diagnosis not present

## 2012-07-25 DIAGNOSIS — E559 Vitamin D deficiency, unspecified: Secondary | ICD-10-CM | POA: Diagnosis not present

## 2012-07-25 DIAGNOSIS — Z125 Encounter for screening for malignant neoplasm of prostate: Secondary | ICD-10-CM | POA: Diagnosis not present

## 2012-08-01 ENCOUNTER — Encounter: Payer: Self-pay | Admitting: Cardiovascular Disease

## 2012-08-01 DIAGNOSIS — Z8249 Family history of ischemic heart disease and other diseases of the circulatory system: Secondary | ICD-10-CM | POA: Diagnosis not present

## 2012-08-01 DIAGNOSIS — I10 Essential (primary) hypertension: Secondary | ICD-10-CM | POA: Diagnosis not present

## 2012-08-15 DIAGNOSIS — R0602 Shortness of breath: Secondary | ICD-10-CM | POA: Diagnosis not present

## 2012-08-15 DIAGNOSIS — J45909 Unspecified asthma, uncomplicated: Secondary | ICD-10-CM | POA: Diagnosis not present

## 2012-08-28 ENCOUNTER — Encounter: Payer: Self-pay | Admitting: *Deleted

## 2012-08-28 ENCOUNTER — Encounter: Payer: Self-pay | Admitting: Cardiovascular Disease

## 2012-08-28 DIAGNOSIS — F419 Anxiety disorder, unspecified: Secondary | ICD-10-CM | POA: Insufficient documentation

## 2012-08-28 DIAGNOSIS — K649 Unspecified hemorrhoids: Secondary | ICD-10-CM | POA: Insufficient documentation

## 2012-08-28 DIAGNOSIS — K5790 Diverticulosis of intestine, part unspecified, without perforation or abscess without bleeding: Secondary | ICD-10-CM | POA: Insufficient documentation

## 2012-08-28 DIAGNOSIS — F329 Major depressive disorder, single episode, unspecified: Secondary | ICD-10-CM | POA: Insufficient documentation

## 2012-08-28 DIAGNOSIS — M109 Gout, unspecified: Secondary | ICD-10-CM | POA: Insufficient documentation

## 2012-08-28 DIAGNOSIS — K219 Gastro-esophageal reflux disease without esophagitis: Secondary | ICD-10-CM | POA: Insufficient documentation

## 2012-08-28 DIAGNOSIS — I10 Essential (primary) hypertension: Secondary | ICD-10-CM | POA: Insufficient documentation

## 2012-08-28 DIAGNOSIS — J309 Allergic rhinitis, unspecified: Secondary | ICD-10-CM | POA: Insufficient documentation

## 2012-08-28 DIAGNOSIS — K449 Diaphragmatic hernia without obstruction or gangrene: Secondary | ICD-10-CM | POA: Insufficient documentation

## 2012-08-29 ENCOUNTER — Encounter: Payer: Self-pay | Admitting: Cardiovascular Disease

## 2012-08-29 ENCOUNTER — Ambulatory Visit (INDEPENDENT_AMBULATORY_CARE_PROVIDER_SITE_OTHER): Payer: Medicare Other | Admitting: Cardiovascular Disease

## 2012-08-29 VITALS — BP 150/89 | HR 58 | Ht 71.0 in

## 2012-08-29 DIAGNOSIS — R9439 Abnormal result of other cardiovascular function study: Secondary | ICD-10-CM | POA: Insufficient documentation

## 2012-08-29 DIAGNOSIS — I10 Essential (primary) hypertension: Secondary | ICD-10-CM | POA: Diagnosis not present

## 2012-08-29 DIAGNOSIS — R9431 Abnormal electrocardiogram [ECG] [EKG]: Secondary | ICD-10-CM | POA: Diagnosis not present

## 2012-08-29 NOTE — Assessment & Plan Note (Signed)
Abnormal stress test No clicnical chest pain  F/U stress myovue

## 2012-08-29 NOTE — Patient Instructions (Addendum)
Your physician has requested that you have a myoview. For further information please visit [email or web address] Please follow instruction sheet, as given.  Your physician recommends that you schedule a follow-up appointment in: as needed

## 2012-08-29 NOTE — Progress Notes (Signed)
Patient ID: Joseph Larsen, male   DOB: [birth year], 66 y.o.   MRN: [id number] 66 yo referred by Dr Tanya Nones for abnromal ETT.  Reveiwed Patient exercise 7:30 max HR 133  In recovery had 1.5 mm ST segment depression.  No chest pain.  He is on disability for PTSS.  Walks daily with no chest pain.  Baseline ECG is normal  Long standing Rx for HTN.  No other CRF;s  No dyspnea, palpitations or syncope.  No previous history of CAD  ROS: Denies fever, malais, weight loss, blurry vision, decreased visual acuity, cough, sputum, SOB, hemoptysis, pleuritic pain, palpitaitons, heartburn, abdominal pain, melena, lower extremity edema, claudication, or rash.  All other systems reviewed and negative   General: Affect appropriate Healthy:  appears stated age HEENT: normal Neck supple with no adenopathy JVP normal no bruits no thyromegaly Lungs clear with no wheezing and good diaphragmatic motion Heart:  S1/S2 no murmur,rub, gallop or click PMI normal Abdomen: benighn, BS positve, no tenderness, no AAA no bruit.  No HSM or HJR Distal pulses intact with no bruits No edema Neuro non-focal Skin warm and dry No muscular weakness  Medications Current Outpatient Prescriptions  Medication Sig Dispense Refill  . allopurinol (ZYLOPRIM) 300 MG tablet Take 300 mg by mouth daily.      Marland Kitchen amLODipine (NORVASC) 10 MG tablet Take 5 mg by mouth daily.      Marland Kitchen aspirin 81 MG tablet Take 81 mg by mouth daily.      Marland Kitchen atenolol (TENORMIN) 100 MG tablet Take 100 mg by mouth daily.      Marland Kitchen buPROPion (WELLBUTRIN SR) 200 MG 12 hr tablet Take 200 mg by mouth 2 (two) times daily.      . flunisolide (NASALIDE) 0.025 % SOLN Inhale 2 sprays into the lungs as needed.      . hydrochlorothiazide (HYDRODIURIL) 25 MG tablet Take 25 mg by mouth daily.      Marland Kitchen omeprazole (PRILOSEC) 20 MG capsule Take 20 mg by mouth daily.      . QUEtiapine (SEROQUEL) 50 MG tablet Take 25 mg by mouth at bedtime.      . valsartan (DIOVAN) 320 MG tablet Take 320 mg  by mouth daily.        Allergies Enalapril maleate and Penicillins  Family History: No family history on file.  Social History: History   Social History  . Marital Status: Married    Spouse Name: N/A    Number of Children: N/A  . Years of Education: N/A   Occupational History  . Not on file.   Social History Main Topics  . Smoking status: Former Games developer  . Smokeless tobacco: Not on file  . Alcohol Use: Not on file  . Drug Use: Not on file  . Sexually Active: Not on file   Other Topics Concern  . Not on file   Social History Narrative  . No narrative on file    Electrocardiogram:  NSR normal ECG  Assessment and Plan

## 2012-08-29 NOTE — Assessment & Plan Note (Signed)
Well controlled.  Continue current medications and low sodium Dash type diet.    

## 2012-09-05 ENCOUNTER — Ambulatory Visit (HOSPITAL_COMMUNITY): Payer: Medicare Other | Attending: Cardiovascular Disease | Admitting: Radiology

## 2012-09-05 VITALS — BP 134/82 | Ht 71.0 in | Wt 183.0 lb

## 2012-09-05 DIAGNOSIS — R0602 Shortness of breath: Secondary | ICD-10-CM | POA: Insufficient documentation

## 2012-09-05 DIAGNOSIS — I10 Essential (primary) hypertension: Secondary | ICD-10-CM | POA: Insufficient documentation

## 2012-09-05 DIAGNOSIS — R002 Palpitations: Secondary | ICD-10-CM | POA: Insufficient documentation

## 2012-09-05 DIAGNOSIS — Z87891 Personal history of nicotine dependence: Secondary | ICD-10-CM | POA: Diagnosis not present

## 2012-09-05 DIAGNOSIS — R9431 Abnormal electrocardiogram [ECG] [EKG]: Secondary | ICD-10-CM

## 2012-09-05 DIAGNOSIS — R9439 Abnormal result of other cardiovascular function study: Secondary | ICD-10-CM

## 2012-09-05 MED ORDER — TECHNETIUM TC 99M SESTAMIBI GENERIC - CARDIOLITE
33.0000 | Freq: Once | INTRAVENOUS | Status: AC | PRN
  Administered 2012-09-05: 33 via INTRAVENOUS

## 2012-09-05 MED ORDER — TECHNETIUM TC 99M SESTAMIBI GENERIC - CARDIOLITE
11.0000 | Freq: Once | INTRAVENOUS | Status: AC | PRN
  Administered 2012-09-05: 11 via INTRAVENOUS

## 2012-09-05 NOTE — Progress Notes (Signed)
[facility] SITE 3 NUCLEAR MED [address] [place] [address] [phone]  Cardiology Nuclear Med Study  Joseph Larsen is a 66 y.o. male     MRN : [id number]     DOB: 04-22-1946  Procedure Date: 09/05/2012  Nuclear Med Background Indication for Stress Test:  Evaluation for Ischemia and Abnormal EKG History:  07/2012: Abnormal ETT with 1.55 mm ST depression in recovery test done by Dr. Tanya Nones Cardiac Risk Factors: History of Smoking and Hypertension  Symptoms:  Palpitations and SOB   Nuclear Pre-Procedure Caffeine/Decaff Intake:  None NPO After: 8:00pm   Lungs:  clear O2 Sat: 97% on room air. IV 0.9% NS with Angio Cath:  20g  IV Site: R Antecubital  IV Started by:  Stanton Kidney, EMT-P  Chest Size (in):  42 Cup Size: n/a  Height: 5\' 11"  (1.803 m)  Weight:  183 lb (83.008 kg)  BMI:  Body mass index is 25.52 kg/(m^2). Tech Comments:  Atenolol held > 20 hours, per patient. With the patient's symptoms and EKG changes pictures checked. They had changes so Dr. Burna Forts was consulted and advised that they were fine and the pt could go home.    Nuclear Med Study 1 or 2 day study: 1 day  Stress Test Type:  Stress  Reading MD: Willa Rough, MD  Order Authorizing Provider:  P.Nishan  Resting Radionuclide: Technetium 63m Sestamibi  Resting Radionuclide Dose: 11.0 mCi   Stress Radionuclide:  Technetium 32m Sestamibi  Stress Radionuclide Dose: 33.0 mCi           Stress Protocol Rest HR: 59 Stress HR: 142  Rest BP: 134/82 Stress BP: 117/90  Exercise Time (min): 10:00 METS: 11.70   Predicted Max HR: 155 bpm % Max HR: 91.61 bpm Rate Pressure Product: 84132   Dose of Adenosine (mg):  n/a Dose of Lexiscan: n/a mg  Dose of Atropine (mg): n/a Dose of Dobutamine: n/a mcg/kg/min (at max HR)  Stress Test Technologist: Milana Na, EMT-P  Nuclear Technologist:  Domenic Polite, CNMT     Rest Procedure:  Myocardial perfusion imaging was performed at rest 45 minutes  following the intravenous administration of Technetium 104m Sestamibi. Rest ECG: NSR - Normal EKG  Stress Procedure:  The patient performed treadmill exercise using a Bruce  Protocol for 10:00 minutes. The patient stopped due to sob, fatigue, and denied any chest pain.  There were + significant ST-T wave changes and occ pvc/rare pacs.  Technetium 44m Sestamibi was injected at peak exercise and myocardial perfusion imaging was performed after a brief delay. Stress ECG: No significant change from baseline ECG  QPS Raw Data Images:  Patient motion noted; appropriate software correction applied. Stress Images:  Normal homogeneous uptake in all areas of the myocardium. Rest Images:  Normal homogeneous uptake in all areas of the myocardium. Subtraction (SDS):  No evidence of ischemia. Transient Ischemic Dilatation (Normal <1.22):  1.00 Lung/Heart Ratio (Normal <0.45):  0.38  Quantitative Gated Spect Images QGS EDV:  104 ml QGS ESV:  35 ml  Impression Exercise Capacity:  Good exercise capacity. BP Response:  Normal blood pressure response. Clinical Symptoms:  the patient did have some chest tightness. ECG Impression:  No significant ST segment change suggestive of ischemia. Comparison with Prior Nuclear Study: No images to compare  Overall Impression:  Normal stress nuclear study.  The patient exercised well. The nuclear images reveal slight apical thinning. There is no diagnostic abnormality. There is no scar or ischemia.  LV Ejection Fraction: 66%.  LV Wall Motion:  Normal Wall Motion  Willa Rough, MD

## 2012-09-13 DIAGNOSIS — D235 Other benign neoplasm of skin of trunk: Secondary | ICD-10-CM | POA: Diagnosis not present

## 2012-09-13 DIAGNOSIS — L570 Actinic keratosis: Secondary | ICD-10-CM | POA: Diagnosis not present

## 2012-10-03 NOTE — Addendum Note (Signed)
Addended by: Marrion Coy L on: 10/03/2012 11:20 AM   Modules accepted: Orders

## 2012-10-29 DIAGNOSIS — R070 Pain in throat: Secondary | ICD-10-CM | POA: Diagnosis not present

## 2012-10-29 DIAGNOSIS — J029 Acute pharyngitis, unspecified: Secondary | ICD-10-CM | POA: Diagnosis not present

## 2012-10-29 DIAGNOSIS — E291 Testicular hypofunction: Secondary | ICD-10-CM | POA: Diagnosis not present

## 2012-10-29 DIAGNOSIS — I10 Essential (primary) hypertension: Secondary | ICD-10-CM | POA: Diagnosis not present

## 2013-01-01 DIAGNOSIS — M5137 Other intervertebral disc degeneration, lumbosacral region: Secondary | ICD-10-CM | POA: Diagnosis not present

## 2013-01-01 DIAGNOSIS — M5430 Sciatica, unspecified side: Secondary | ICD-10-CM | POA: Diagnosis not present

## 2013-01-07 DIAGNOSIS — IMO0001 Reserved for inherently not codable concepts without codable children: Secondary | ICD-10-CM | POA: Diagnosis not present

## 2013-01-07 DIAGNOSIS — M62838 Other muscle spasm: Secondary | ICD-10-CM | POA: Diagnosis not present

## 2013-01-07 DIAGNOSIS — M999 Biomechanical lesion, unspecified: Secondary | ICD-10-CM | POA: Diagnosis not present

## 2013-01-07 DIAGNOSIS — M5137 Other intervertebral disc degeneration, lumbosacral region: Secondary | ICD-10-CM | POA: Diagnosis not present

## 2013-01-14 DIAGNOSIS — M5137 Other intervertebral disc degeneration, lumbosacral region: Secondary | ICD-10-CM | POA: Diagnosis not present

## 2013-01-18 ENCOUNTER — Encounter: Payer: Self-pay | Admitting: Family Medicine

## 2013-02-08 DIAGNOSIS — M5137 Other intervertebral disc degeneration, lumbosacral region: Secondary | ICD-10-CM | POA: Diagnosis not present

## 2013-02-27 ENCOUNTER — Ambulatory Visit (INDEPENDENT_AMBULATORY_CARE_PROVIDER_SITE_OTHER): Payer: Medicare Other | Admitting: Family Medicine

## 2013-02-27 ENCOUNTER — Encounter: Payer: Self-pay | Admitting: Family Medicine

## 2013-02-27 VITALS — BP 142/80 | HR 67 | Temp 98.4°F | Ht 71.0 in | Wt 186.0 lb

## 2013-02-27 DIAGNOSIS — G5692 Unspecified mononeuropathy of left upper limb: Secondary | ICD-10-CM

## 2013-02-27 DIAGNOSIS — I10 Essential (primary) hypertension: Secondary | ICD-10-CM | POA: Diagnosis not present

## 2013-02-27 DIAGNOSIS — M1990 Unspecified osteoarthritis, unspecified site: Secondary | ICD-10-CM

## 2013-02-27 DIAGNOSIS — E785 Hyperlipidemia, unspecified: Secondary | ICD-10-CM

## 2013-02-27 DIAGNOSIS — E559 Vitamin D deficiency, unspecified: Secondary | ICD-10-CM

## 2013-02-27 DIAGNOSIS — L570 Actinic keratosis: Secondary | ICD-10-CM

## 2013-02-27 DIAGNOSIS — F4310 Post-traumatic stress disorder, unspecified: Secondary | ICD-10-CM

## 2013-02-27 DIAGNOSIS — D649 Anemia, unspecified: Secondary | ICD-10-CM

## 2013-02-27 MED ORDER — HYDROCODONE-ACETAMINOPHEN 5-325 MG PO TABS: 1.0000 | ORAL_TABLET | Freq: Four times a day (QID) | ORAL | Status: DC | PRN

## 2013-02-27 MED ORDER — DIAZEPAM 10 MG PO TABS: 10.0000 mg | ORAL_TABLET | Freq: Three times a day (TID) | ORAL | Status: DC | PRN

## 2013-02-27 NOTE — Progress Notes (Signed)
  Subjective:    Patient ID: Joseph Larsen, male    DOB: 1946/01/29, 67 y.o.   MRN: [id number]  HPI Mr. Evitt returns for routine followup of several chronic problems. This includes his PTSD. This also includes his hypertension GERD and anxiety and depression. He has been having increasing problems with low back pain. He has been seeing Dr. Leslee Home for this and had an MRI of his LS spine which showed degenerative disc disease. Also reviewed lab reports from the [place] which showed LDL cholesterol was 63.   Review of Systems  HENT: Negative.   Eyes: Negative.   Respiratory: Negative.   Cardiovascular: Negative.   Endocrine: Negative.   Genitourinary: Negative.   Musculoskeletal: Positive for back pain (lower back).  Allergic/Immunologic: Negative.   Neurological: Positive for numbness (L hand).  Look at lesion on back.     Objective:   Physical Exam  Nursing note and vitals reviewed. Constitutional: He is oriented to person, place, and time. He appears well-developed and well-nourished.  HENT:  Head: Normocephalic and atraumatic.  Right Ear: External ear normal.  Left Ear: External ear normal.  Eyes: EOM are normal.  Neck: Normal range of motion. Neck supple. No thyromegaly present.  Cardiovascular: Normal rate, regular rhythm and normal heart sounds.   Pulmonary/Chest: Effort normal and breath sounds normal.  Abdominal: Soft. Bowel sounds are normal.  Musculoskeletal: Normal range of motion.  Lymphadenopathy:    He has no cervical adenopathy.  Neurological: He is alert and oriented to person, place, and time. He has normal reflexes.  Skin: Skin is warm and dry.  Probable solar keratosis  Psychiatric: He has a normal mood and affect. His behavior is normal. Judgment and thought content normal.          Assessment & Plan:  Other and unspecified hyperlipidemia - Plan: Lipid panel, Hepatic function panel  Essential hypertension, benign - Plan: Basic metabolic  panel  Osteoarthritis - Plan: HYDROcodone-acetaminophen (NORCO/VICODIN) 5-325 MG per tablet  PTSD (post-traumatic stress disorder) - Plan: diazepam (VALIUM) 10 MG tablet  Solar keratosis  Anemia - Plan: POCT CBC  Unspecified vitamin D deficiency  Neuropathy of hand, left  Patient Instructions  Use wrist brace on left hand at night If wrist neuropathy continues let me know and we will order PN CV testing Return F OB T Continue current medication Continue therapeutic lifestyle changes   Cryotherapy of lesion on back

## 2013-02-27 NOTE — Patient Instructions (Signed)
Use wrist brace on left hand at night If wrist neuropathy continues let me know and we will order PN CV testing Return F OB T Continue current medication Continue therapeutic lifestyle changes

## 2013-03-04 ENCOUNTER — Other Ambulatory Visit: Payer: Self-pay | Admitting: *Deleted

## 2013-03-05 ENCOUNTER — Other Ambulatory Visit (INDEPENDENT_AMBULATORY_CARE_PROVIDER_SITE_OTHER): Payer: Medicare Other

## 2013-03-05 DIAGNOSIS — E559 Vitamin D deficiency, unspecified: Secondary | ICD-10-CM

## 2013-03-05 DIAGNOSIS — E785 Hyperlipidemia, unspecified: Secondary | ICD-10-CM | POA: Diagnosis not present

## 2013-03-05 DIAGNOSIS — I10 Essential (primary) hypertension: Secondary | ICD-10-CM

## 2013-03-05 LAB — HEPATIC FUNCTION PANEL
ALT: 18 U/L (ref 0–53)
AST: 24 U/L (ref 0–37)
Albumin: 4.1 g/dL (ref 3.5–5.2)
Alkaline Phosphatase: 66 U/L (ref 39–117)
Total Protein: 6.1 g/dL (ref 6.0–8.3)

## 2013-03-05 LAB — POCT CBC
Granulocyte percent: 71.5 %G (ref 37–80)
Hemoglobin: 16 g/dL (ref 14.1–18.1)
MCHC: 32.8 g/dL (ref 31.8–35.4)

## 2013-03-05 LAB — BASIC METABOLIC PANEL
BUN: 12 mg/dL (ref 6–23)
Chloride: 100 mEq/L (ref 96–112)
Creat: 0.7 mg/dL (ref 0.50–1.35)
Glucose, Bld: 80 mg/dL (ref 70–99)
Potassium: 3.7 mEq/L (ref 3.5–5.3)

## 2013-03-05 NOTE — Progress Notes (Signed)
Pt came in for labs only 

## 2013-03-06 LAB — NMR, LIPOPROFILE

## 2013-03-11 ENCOUNTER — Telehealth: Payer: Self-pay | Admitting: Family Medicine

## 2013-03-11 NOTE — Telephone Encounter (Signed)
LMOM

## 2013-03-12 NOTE — Telephone Encounter (Signed)
Spoke with pt's wife Spot was red and itching, used hydrocortisone cream and neosporin and is much better today Will call and check with pt tomorrow

## 2013-03-13 NOTE — Telephone Encounter (Signed)
Patient reports that blisters are still intact and redness has improved.  Will continue to apply antibiotic ointment and hydrocortisone cream.  Will call if symptoms worsen.

## 2013-03-18 ENCOUNTER — Ambulatory Visit (INDEPENDENT_AMBULATORY_CARE_PROVIDER_SITE_OTHER): Payer: Medicare Other | Admitting: Family Medicine

## 2013-03-18 ENCOUNTER — Encounter: Payer: Self-pay | Admitting: Family Medicine

## 2013-03-18 VITALS — BP 126/74 | HR 70 | Temp 97.0°F | Ht 71.0 in | Wt 190.0 lb

## 2013-03-18 DIAGNOSIS — L209 Atopic dermatitis, unspecified: Secondary | ICD-10-CM

## 2013-03-18 DIAGNOSIS — L2089 Other atopic dermatitis: Secondary | ICD-10-CM

## 2013-03-18 NOTE — Patient Instructions (Addendum)
Change to milder soaps detergents fabric softeners No neosporin Use only cortisone 10 lightly 2-3 times daily

## 2013-03-18 NOTE — Progress Notes (Signed)
  Subjective:    Patient ID: Joseph Larsen, male    DOB: March 02, 1946, 68 y.o.   MRN: [id number]  HPI Redness and pruritus at the site of cryotherapy on the right back   Review of Systems  Skin: Positive for wound (redness and itching).       Objective:   Physical Exam Redness surrounding area of cryotherapy on back      Assessment & Plan:  Atopic dermatitis  Plan  Continue cortisone 10 application lightly 2 or 3 times daily  change to milder soaps detergents and fabric softeners

## 2013-05-07 DIAGNOSIS — H40019 Open angle with borderline findings, low risk, unspecified eye: Secondary | ICD-10-CM | POA: Diagnosis not present

## 2013-05-07 DIAGNOSIS — H2510 Age-related nuclear cataract, unspecified eye: Secondary | ICD-10-CM | POA: Diagnosis not present

## 2013-05-23 ENCOUNTER — Encounter: Payer: Self-pay | Admitting: *Deleted

## 2013-06-13 ENCOUNTER — Encounter: Payer: Self-pay | Admitting: General Practice

## 2013-06-13 ENCOUNTER — Ambulatory Visit (INDEPENDENT_AMBULATORY_CARE_PROVIDER_SITE_OTHER): Payer: Medicare Other | Admitting: General Practice

## 2013-06-13 VITALS — BP 137/82 | HR 71 | Temp 98.2°F | Ht 71.0 in | Wt 180.0 lb

## 2013-06-13 DIAGNOSIS — L259 Unspecified contact dermatitis, unspecified cause: Secondary | ICD-10-CM | POA: Diagnosis not present

## 2013-06-13 MED ORDER — METHYLPREDNISOLONE ACETATE 80 MG/ML IJ SUSP
80.0000 mg | Freq: Once | INTRAMUSCULAR | Status: AC
  Administered 2013-06-13: 80 mg via INTRAMUSCULAR

## 2013-06-13 MED ORDER — PREDNISONE (PAK) 10 MG PO TABS: ORAL_TABLET | ORAL | Status: DC

## 2013-06-13 NOTE — Patient Instructions (Addendum)

## 2013-06-13 NOTE — Progress Notes (Signed)
  Subjective:    Patient ID: Joseph Larsen, male    DOB: 22-Jul-1946, 67 y.o.   MRN: [id number]  Rash This is a new problem. The current episode started in the past 7 days. The problem has been gradually worsening since onset. The affected locations include the back, left shoulder, right shoulder and chest. The rash is characterized by blistering, itchiness and redness. He was exposed to nothing. Pertinent negatives include no congestion, cough, diarrhea, fever, rhinorrhea, shortness of breath or vomiting. Past treatments include anti-itch cream. There is no history of allergies or eczema.      Review of Systems  Constitutional: Negative for fever and chills.  HENT: Negative for congestion and rhinorrhea.   Respiratory: Negative for cough and shortness of breath.   Gastrointestinal: Negative for vomiting, diarrhea and blood in stool.  Genitourinary: Negative for difficulty urinating.  Skin: Positive for rash.       Rash to back, shoulder, and chest  Neurological: Negative for dizziness, weakness and headaches.       Objective:   Physical Exam  Constitutional: He is oriented to person, place, and time. He appears well-developed and well-nourished.  Cardiovascular: Normal rate, regular rhythm and normal heart sounds.   Pulmonary/Chest: Effort normal and breath sounds normal. No respiratory distress. He exhibits no tenderness.  Neurological: He is alert and oriented to person, place, and time.  Skin: Skin is warm and dry. Rash noted. There is erythema.  Maculopapular erythematous rash noted to back, shoulders, and chest. Negative drainage  Psychiatric: He has a normal mood and affect.          Assessment & Plan:  1. Contact dermatitit - methylPREDNISolone acetate (DEPO-MEDROL) injection 80 mg; Inject 1 mL (80 mg total) into the muscle once. - predniSONE (STERAPRED UNI-PAK) 10 MG tablet; Take as directed. Start on 06/14/13  Dispense: 21 tablet; Refill: 0 -Continue proper skin  protection from sun -keep skin clean and dry -Monitor for signs of infection as discussed -RTO for follow up on Monday and seek emergency medical treatment if symptoms worsen.  -Patient verbalized understanding -Coralie Keens, FNP-C

## 2013-06-17 ENCOUNTER — Ambulatory Visit: Payer: Medicare Other | Admitting: General Practice

## 2013-06-17 ENCOUNTER — Ambulatory Visit: Payer: Medicare Other | Admitting: Family Medicine

## 2013-06-25 ENCOUNTER — Ambulatory Visit: Payer: Medicare Other | Admitting: Family Medicine

## 2013-09-05 DIAGNOSIS — H1130 Conjunctival hemorrhage, unspecified eye: Secondary | ICD-10-CM | POA: Diagnosis not present

## 2013-10-02 ENCOUNTER — Encounter: Payer: Self-pay | Admitting: Family Medicine

## 2013-10-02 ENCOUNTER — Ambulatory Visit (INDEPENDENT_AMBULATORY_CARE_PROVIDER_SITE_OTHER): Payer: Medicare Other | Admitting: Family Medicine

## 2013-10-02 VITALS — BP 153/93 | HR 59 | Temp 98.2°F | Ht 71.0 in | Wt 185.0 lb

## 2013-10-02 DIAGNOSIS — I10 Essential (primary) hypertension: Secondary | ICD-10-CM

## 2013-10-02 DIAGNOSIS — K219 Gastro-esophageal reflux disease without esophagitis: Secondary | ICD-10-CM

## 2013-10-02 DIAGNOSIS — F411 Generalized anxiety disorder: Secondary | ICD-10-CM | POA: Diagnosis not present

## 2013-10-02 DIAGNOSIS — F4310 Post-traumatic stress disorder, unspecified: Secondary | ICD-10-CM

## 2013-10-02 DIAGNOSIS — Z23 Encounter for immunization: Secondary | ICD-10-CM | POA: Diagnosis not present

## 2013-10-02 DIAGNOSIS — F419 Anxiety disorder, unspecified: Secondary | ICD-10-CM

## 2013-10-02 DIAGNOSIS — E559 Vitamin D deficiency, unspecified: Secondary | ICD-10-CM

## 2013-10-02 DIAGNOSIS — J309 Allergic rhinitis, unspecified: Secondary | ICD-10-CM

## 2013-10-02 DIAGNOSIS — K449 Diaphragmatic hernia without obstruction or gangrene: Secondary | ICD-10-CM

## 2013-10-02 DIAGNOSIS — N400 Enlarged prostate without lower urinary tract symptoms: Secondary | ICD-10-CM

## 2013-10-02 NOTE — Progress Notes (Signed)
Subjective:    Patient ID: Joseph Larsen, male    DOB: [birth year], 67 y.o.   MRN: [id number]  HPI Pt here for follow up and management of chronic medical problems. As of note the patient had a routine screening at the [facility] for abdominal aortic aneurysms a couple of weeks ago and patient says he has not heard anything from this report indicating that it was probably all normal. Patient continues to see the psychologist for his PTSD.    Patient Active Problem List   Diagnosis Date Noted  . Abnormal stress test 08/29/2012  . GERD (gastroesophageal reflux disease)   . HTN (hypertension)   . Anxiety   . Depression   . Allergic rhinitis   . Diverticular disease   . Hemorrhoids   . Hiatal hernia   . Gout   . MACROCYTIC ANEMIA 08/09/2010  . NONSPEC ELEVATION OF LEVELS OF TRANSAMINASE/LDH 08/09/2010  . NONSPECIFIC ABNORMAL FINDING IN STOOL CONTENTS 08/09/2010   Outpatient Encounter Prescriptions as of 10/02/2013  Medication Sig Dispense Refill  . allopurinol (ZYLOPRIM) 300 MG tablet Take 300 mg by mouth daily.      Marland Kitchen amLODipine (NORVASC) 10 MG tablet Take 5 mg by mouth daily.      Marland Kitchen aspirin 81 MG tablet Take 81 mg by mouth daily.      Marland Kitchen atenolol (TENORMIN) 100 MG tablet Take 100 mg by mouth daily.      Marland Kitchen buPROPion (WELLBUTRIN SR) 200 MG 12 hr tablet Take 200 mg by mouth 2 (two) times daily.      . diazepam (VALIUM) 10 MG tablet Take 1 tablet (10 mg total) by mouth every 8 (eight) hours as needed for anxiety.  90 tablet  1  . flunisolide (NASALIDE) 0.025 % SOLN Inhale 2 sprays into the lungs as needed.      . hydrochlorothiazide (HYDRODIURIL) 25 MG tablet Take 25 mg by mouth daily.      . meloxicam (MOBIC) 7.5 MG tablet Take 7.5 mg by mouth daily.      Marland Kitchen omeprazole (PRILOSEC) 20 MG capsule Take 20 mg by mouth daily.      . QUEtiapine (SEROQUEL) 50 MG tablet Take 25 mg by mouth at bedtime.      . valsartan (DIOVAN) 320 MG tablet Take 320 mg by mouth daily.      Marland Kitchen  HYDROcodone-acetaminophen (NORCO/VICODIN) 5-325 MG per tablet Take 1 tablet by mouth every 6 (six) hours as needed for pain.  30 tablet  0  . [DISCONTINUED] predniSONE (STERAPRED UNI-PAK) 10 MG tablet Take as directed. Start on 06/14/13  21 tablet  0   No facility-administered encounter medications on file as of 10/02/2013.    Review of Systems  Constitutional: Negative.   HENT: Negative.   Eyes: Negative.   Respiratory: Negative.   Cardiovascular: Negative.   Gastrointestinal: Negative.   Endocrine: Negative.   Genitourinary: Negative.   Musculoskeletal: Negative.   Skin: Negative.   Allergic/Immunologic: Negative.   Neurological: Negative.   Hematological: Negative.   Psychiatric/Behavioral: Negative.        Objective:   Physical Exam  Nursing note and vitals reviewed. Constitutional: He is oriented to person, place, and time. He appears well-developed and well-nourished. No distress.  HENT:  Head: Normocephalic and atraumatic.  Right Ear: External ear normal.  Left Ear: External ear normal.  Mouth/Throat: Oropharynx is clear and moist. No oropharyngeal exudate.  Nasal congestion bilateral  Eyes: Conjunctivae and EOM are normal. Right eye exhibits no  discharge. Left eye exhibits no discharge. No scleral icterus.  Neck: Normal range of motion. Neck supple. No thyromegaly present.  No bruits in the neck  Cardiovascular: Normal rate, regular rhythm, normal heart sounds and intact distal pulses.  Exam reveals no gallop and no friction rub.   No murmur heard. At 72 per minute  Pulmonary/Chest: Effort normal and breath sounds normal. He has no wheezes. He has no rales. He exhibits no tenderness.  Abdominal: Soft. Bowel sounds are normal. He exhibits no mass. There is no tenderness. There is no rebound and no guarding.  Genitourinary: Rectum normal.  Musculoskeletal: Normal range of motion. He exhibits no edema and no tenderness.  Lymphadenopathy:    He has no cervical  adenopathy.  Neurological: He is alert and oriented to person, place, and time. He has normal reflexes. No cranial nerve deficit.  Skin: Skin is warm and dry. No rash noted. No erythema. No pallor.  Psychiatric: He has a normal mood and affect. His behavior is normal. Judgment and thought content normal.   BP 153/93  Pulse 59  Temp(Src) 98.2 F (36.8 C) (Oral)  Ht 5\' 11"  (1.803 m)  Wt 185 lb (83.915 kg)  BMI 25.81 kg/m2        Assessment & Plan:    1. HTN (hypertension)   2. Anxiety   3. GERD (gastroesophageal reflux disease)   4. Vitamin D deficiency   5. BPH (benign prostatic hyperplasia)   6. Hiatal hernia   7. Allergic rhinitis   8. PTSD (post-traumatic stress disorder)    Orders Placed This Encounter  Procedures  . PSA, total and free    Standing Status: Future     Number of Occurrences:      Standing Expiration Date: 10/02/2014  . Hepatic function panel    Standing Status: Future     Number of Occurrences:      Standing Expiration Date: 10/02/2014  . BMP8+EGFR    Standing Status: Future     Number of Occurrences:      Standing Expiration Date: 10/02/2014  . Vit D  25 hydroxy (rtn osteoporosis monitoring)    Standing Status: Future     Number of Occurrences:      Standing Expiration Date: 10/02/2014  . POCT CBC    Standing Status: Future     Number of Occurrences:      Standing Expiration Date: 11/02/2013   No orders of the defined types were placed in this encounter.   Patient Instructions  Continue current medications. Continue good therapeutic lifestyle changes.  Fall precautions discussed with patient. follow up as planned and earlier as needed.  Continue taking medications as doing Restart your allergy nose spray. In the future you may want to start this in August instead of October  We will not do a PSA until the springtime when your rectal exam is due Drink plenty of fluids Watch sodium intake Review the Prevnar sometime in 2015 Return to  clinic and get lab work Be sure to bring a copy of the ultrasound of your abdomen from the [place]   Nyra Capes MD

## 2013-10-02 NOTE — Patient Instructions (Addendum)
Continue current medications. Continue good therapeutic lifestyle changes.  Fall precautions discussed with patient. follow up as planned and earlier as needed.  Continue taking medications as doing Restart your allergy nose spray. In the future you may want to start this in August instead of October  We will not do a PSA until the springtime when your rectal exam is due Drink plenty of fluids Watch sodium intake Review the Prevnar sometime in 2015 Return to clinic and get lab work Be sure to bring a copy of the ultrasound of your abdomen from the [place]

## 2013-10-07 ENCOUNTER — Other Ambulatory Visit (INDEPENDENT_AMBULATORY_CARE_PROVIDER_SITE_OTHER): Payer: Medicare Other

## 2013-10-07 DIAGNOSIS — K219 Gastro-esophageal reflux disease without esophagitis: Secondary | ICD-10-CM

## 2013-10-07 DIAGNOSIS — F419 Anxiety disorder, unspecified: Secondary | ICD-10-CM

## 2013-10-07 DIAGNOSIS — N400 Enlarged prostate without lower urinary tract symptoms: Secondary | ICD-10-CM

## 2013-10-07 DIAGNOSIS — E559 Vitamin D deficiency, unspecified: Secondary | ICD-10-CM

## 2013-10-07 DIAGNOSIS — I10 Essential (primary) hypertension: Secondary | ICD-10-CM

## 2013-10-07 DIAGNOSIS — E785 Hyperlipidemia, unspecified: Secondary | ICD-10-CM | POA: Diagnosis not present

## 2013-10-07 DIAGNOSIS — F411 Generalized anxiety disorder: Secondary | ICD-10-CM | POA: Diagnosis not present

## 2013-10-07 LAB — POCT CBC
Granulocyte percent: 57.5 %G (ref 37–80)
HCT, POC: 53 % (ref 43.5–53.7)
Hemoglobin: 17.7 g/dL (ref 14.1–18.1)
MCV: 100.7 fL — AB (ref 80–97)
POC Granulocyte: 2.8 (ref 2–6.9)
RBC: 5.3 M/uL (ref 4.69–6.13)
RDW, POC: 12.8 %
WBC: 4.9 10*3/uL (ref 4.6–10.2)

## 2013-10-07 NOTE — Progress Notes (Signed)
Pt came in for labs only 

## 2013-10-08 LAB — BMP8+EGFR
BUN/Creatinine Ratio: 20 (ref 10–22)
BUN: 16 mg/dL (ref 8–27)
CO2: 28 mmol/L (ref 18–29)
Chloride: 98 mmol/L (ref 97–108)
GFR calc Af Amer: 107 mL/min/{1.73_m2} (ref >59)
Glucose: 86 mg/dL (ref 65–99)
Sodium: 142 mmol/L (ref 134–144)

## 2013-10-08 LAB — HEPATIC FUNCTION PANEL
AST: 40 IU/L (ref 0–40)
Albumin: 4.2 g/dL (ref 3.6–4.8)
Alkaline Phosphatase: 58 IU/L (ref 39–117)
Bilirubin, Direct: 0.23 mg/dL (ref 0.00–0.40)
Total Bilirubin: 0.7 mg/dL (ref 0.0–1.2)
Total Protein: 6.6 g/dL (ref 6.0–8.5)

## 2013-10-08 LAB — SPECIMEN STATUS REPORT

## 2013-10-08 LAB — VITAMIN D 25 HYDROXY (VIT D DEFICIENCY, FRACTURES): Vit D, 25-Hydroxy: 88.3 ng/mL (ref 30.0–100.0)

## 2013-10-09 LAB — LIPID PANEL
Chol/HDL Ratio: 3.3 ratio units (ref 0.0–5.0)
Cholesterol, Total: 124 mg/dL (ref 100–199)
HDL: 38 mg/dL — ABNORMAL LOW (ref >39)
LDL Calculated: 71 mg/dL (ref 0–99)
VLDL Cholesterol Cal: 15 mg/dL (ref 5–40)

## 2013-10-09 LAB — SPECIMEN STATUS REPORT

## 2013-10-10 DIAGNOSIS — D235 Other benign neoplasm of skin of trunk: Secondary | ICD-10-CM | POA: Diagnosis not present

## 2013-10-10 DIAGNOSIS — L570 Actinic keratosis: Secondary | ICD-10-CM | POA: Diagnosis not present

## 2013-10-10 DIAGNOSIS — L819 Disorder of pigmentation, unspecified: Secondary | ICD-10-CM | POA: Diagnosis not present

## 2013-10-10 DIAGNOSIS — L259 Unspecified contact dermatitis, unspecified cause: Secondary | ICD-10-CM | POA: Diagnosis not present

## 2013-10-23 ENCOUNTER — Other Ambulatory Visit (INDEPENDENT_AMBULATORY_CARE_PROVIDER_SITE_OTHER): Payer: Medicare Other

## 2013-10-23 DIAGNOSIS — R7989 Other specified abnormal findings of blood chemistry: Secondary | ICD-10-CM

## 2013-10-23 NOTE — Addendum Note (Signed)
Addended by: Orma Render F on: 10/23/2013 03:55 PM   Modules accepted: Orders

## 2013-10-24 LAB — CBC WITH DIFFERENTIAL
Basophils Absolute: 0 10*3/uL (ref 0.0–0.2)
Eosinophils Absolute: 0.1 10*3/uL (ref 0.0–0.4)
HCT: 49 % (ref 37.5–51.0)
Immature Granulocytes: 0 %
Lymphocytes Absolute: 2 10*3/uL (ref 0.7–3.1)
MCHC: 35.3 g/dL (ref 31.5–35.7)
MCV: 97 fL (ref 79–97)
Neutrophils Absolute: 3.4 10*3/uL (ref 1.4–7.0)
Platelets: 190 10*3/uL (ref 150–379)
RDW: 12.8 % (ref 12.3–15.4)

## 2013-10-31 ENCOUNTER — Encounter: Payer: Self-pay | Admitting: Family Medicine

## 2013-10-31 ENCOUNTER — Ambulatory Visit: Payer: Medicare Other | Admitting: Family Medicine

## 2013-11-06 NOTE — Progress Notes (Deleted)
   Subjective:    Patient ID: Joseph Larsen, male    DOB: 09/16/1946, 67 y.o.   MRN: [id number]  HPI    Review of Systems     Objective:   Physical Exam        Assessment & Plan:   

## 2013-11-06 NOTE — Progress Notes (Signed)
   Subjective:    Patient ID: Joseph Larsen, male    DOB: [birth year], 68 y.o.   MRN: [id number]  HPI    Review of Systems     Objective:   Physical Exam        Assessment & Plan:

## 2013-11-12 ENCOUNTER — Ambulatory Visit (INDEPENDENT_AMBULATORY_CARE_PROVIDER_SITE_OTHER): Payer: Medicare Other | Admitting: General Practice

## 2013-11-12 ENCOUNTER — Telehealth: Payer: Self-pay | Admitting: Family Medicine

## 2013-11-12 ENCOUNTER — Encounter: Payer: Self-pay | Admitting: General Practice

## 2013-11-12 VITALS — BP 140/75 | HR 56 | Temp 97.8°F | Ht 71.0 in | Wt 186.0 lb

## 2013-11-12 DIAGNOSIS — L03114 Cellulitis of left upper limb: Secondary | ICD-10-CM

## 2013-11-12 DIAGNOSIS — L259 Unspecified contact dermatitis, unspecified cause: Secondary | ICD-10-CM | POA: Diagnosis not present

## 2013-11-12 DIAGNOSIS — L02519 Cutaneous abscess of unspecified hand: Secondary | ICD-10-CM | POA: Diagnosis not present

## 2013-11-12 MED ORDER — TRIAMCINOLONE ACETONIDE 0.1 % EX CREA: 1.0000 "application " | TOPICAL_CREAM | Freq: Two times a day (BID) | CUTANEOUS | Status: AC

## 2013-11-12 NOTE — Progress Notes (Signed)
   Subjective:    Patient ID: Joseph Larsen, male    DOB: [birth year], 67 y.o.   MRN: [id number]  Rash This is a new problem. The current episode started 1 to 4 weeks ago. The problem is unchanged. The affected locations include the left arm and right arm. The rash is characterized by itchiness and redness. He was exposed to nothing. Pertinent negatives include no congestion, cough, fever, shortness of breath or sore throat. Past treatments include anti-itch cream. The treatment provided no relief. There is no history of allergies, asthma or eczema.      Review of Systems  Constitutional: Negative for fever and chills.  HENT: Negative for congestion and sore throat.   Respiratory: Negative for cough, chest tightness and shortness of breath.   Cardiovascular: Negative for chest pain and palpitations.  Skin: Positive for rash.       Red rash to both lower arms. Tender area to posterior wrist from dog scratch two weeks ago.        Objective:   Physical Exam  Constitutional: He is oriented to person, place, and time. He appears well-developed and well-nourished.  Cardiovascular: Normal rate, regular rhythm and normal heart sounds.   Pulmonary/Chest: Effort normal and breath sounds normal. No respiratory distress. He exhibits no tenderness.  Neurological: He is alert and oriented to person, place, and time.  Skin: Skin is warm and dry. Rash noted.  Macular erythematous rash to bilateral lower arms. Cellulitis noted to left posterior wrist, negative drainage.   Psychiatric: He has a normal mood and affect.          Assessment & Plan:  1. Contact dermatitis - triamcinolone cream (KENALOG) 0.1 %; Apply 1 application topically 2 (two) times daily.  Dispense: 30 g; Refill: 0 -keep area clean and dry    2. Cellulitis of left hand -patient has zpack to complete -RTO if symptoms worsen or unresolved Patient verbalized understanding Coralie Keens, FNP-C

## 2013-11-12 NOTE — Patient Instructions (Signed)

## 2013-11-12 NOTE — Telephone Encounter (Signed)
Rash on arm  appt scheduled  Pt notified

## 2013-12-23 ENCOUNTER — Ambulatory Visit (INDEPENDENT_AMBULATORY_CARE_PROVIDER_SITE_OTHER): Payer: Medicare Other | Admitting: Nurse Practitioner

## 2013-12-23 VITALS — BP 153/85 | HR 67 | Temp 97.3°F | Ht 71.0 in | Wt 187.0 lb

## 2013-12-23 DIAGNOSIS — R002 Palpitations: Secondary | ICD-10-CM | POA: Diagnosis not present

## 2013-12-23 DIAGNOSIS — R079 Chest pain, unspecified: Secondary | ICD-10-CM

## 2013-12-23 NOTE — Progress Notes (Signed)
   Subjective:    Patient ID: Joseph Larsen, male    DOB: March 21, 1946, 68 y.o.   MRN: [id number]  HPI Patient in today c/o chest pain- pressure feeling - says that heart feels like it is pounding and fluttering- Started 10 days ago- occurring daily- intermittent- lasting about 20-30 min.- Saw cardiologist about  1 year ago and had stress test which was clear. Has slight SOB with chest pain.    Review of Systems  Constitutional: Negative for fever, chills and fatigue.  HENT: Negative.   Respiratory: Positive for chest tightness.   Cardiovascular: Positive for chest pain and palpitations. Negative for leg swelling.  Gastrointestinal: Negative.   Genitourinary: Negative.   Musculoskeletal: Negative.   Neurological: Negative.   Hematological: Negative.        Objective:   Physical Exam  Constitutional: He is oriented to person, place, and time. He appears well-developed and well-nourished.  Cardiovascular: Normal rate, regular rhythm and normal heart sounds.   Pulmonary/Chest: Effort normal and breath sounds normal.  Musculoskeletal: Normal range of motion.  Neurological: He is alert and oriented to person, place, and time.  Skin: Skin is warm.  Psychiatric: He has a normal mood and affect. His behavior is normal. Judgment and thought content normal.    BP 153/85  Pulse 67  Temp(Src) 97.3 F (36.3 C) (Oral)  Ht 5\' 11"  (1.803 m)  Wt 187 lb (84.823 kg)  BMI 26.09 kg/m2  ekg- sinus irreuglarity-no acute findings     Assessment & Plan:   1. Chest pain   2. Palpitations    Orders Placed This Encounter  Procedures  . Ambulatory referral to Cardiology    Referral Priority:  Routine    Referral Type:  Consultation    Referral Reason:  Specialty Services Required    Referred to Provider:  Carlena Bjornstad, MD    Requested Specialty:  Cardiology    Number of Visits Requested:  1  . EKG 12-Lead   No strenuous activity Avoid caffeine Rest To er if chest pain develops that  doesn't resolve or experience extreme SOB Mary-Margaret Hassell Done, FNP

## 2013-12-23 NOTE — Patient Instructions (Signed)
Chest Pain (Nonspecific) °It is often hard to give a specific diagnosis for the cause of chest pain. There is always a chance that your pain could be related to something serious, such as a heart attack or a blood clot in the lungs. You need to follow up with your caregiver for further evaluation. °CAUSES  °· Heartburn. °· Pneumonia or bronchitis. °· Anxiety or stress. °· Inflammation around your heart (pericarditis) or lung (pleuritis or pleurisy). °· A blood clot in the lung. °· A collapsed lung (pneumothorax). It can develop suddenly on its own (spontaneous pneumothorax) or from injury (trauma) to the chest. °· Shingles infection (herpes zoster virus). °The chest wall is composed of bones, muscles, and cartilage. Any of these can be the source of the pain. °· The bones can be bruised by injury. °· The muscles or cartilage can be strained by coughing or overwork. °· The cartilage can be affected by inflammation and become sore (costochondritis). °DIAGNOSIS  °Lab tests or other studies, such as X-rays, electrocardiography, stress testing, or cardiac imaging, may be needed to find the cause of your pain.  °TREATMENT  °· Treatment depends on what may be causing your chest pain. Treatment may include: °· Acid blockers for heartburn. °· Anti-inflammatory medicine. °· Pain medicine for inflammatory conditions. °· Antibiotics if an infection is present. °· You may be advised to change lifestyle habits. This includes stopping smoking and avoiding alcohol, caffeine, and chocolate. °· You may be advised to keep your head raised (elevated) when sleeping. This reduces the chance of acid going backward from your stomach into your esophagus. °· Most of the time, nonspecific chest pain will improve within 2 to 3 days with rest and mild pain medicine. °HOME CARE INSTRUCTIONS  °· If antibiotics were prescribed, take your antibiotics as directed. Finish them even if you start to feel better. °· For the next few days, avoid physical  activities that bring on chest pain. Continue physical activities as directed. °· Do not smoke. °· Avoid drinking alcohol. °· Only take over-the-counter or prescription medicine for pain, discomfort, or fever as directed by your caregiver. °· Follow your caregiver's suggestions for further testing if your chest pain does not go away. °· Keep any follow-up appointments you made. If you do not go to an appointment, you could develop lasting (chronic) problems with pain. If there is any problem keeping an appointment, you must call to reschedule. °SEEK MEDICAL CARE IF:  °· You think you are having problems from the medicine you are taking. Read your medicine instructions carefully. °· Your chest pain does not go away, even after treatment. °· You develop a rash with blisters on your chest. °SEEK IMMEDIATE MEDICAL CARE IF:  °· You have increased chest pain or pain that spreads to your arm, neck, jaw, back, or abdomen. °· You develop shortness of breath, an increasing cough, or you are coughing up blood. °· You have severe back or abdominal pain, feel nauseous, or vomit. °· You develop severe weakness, fainting, or chills. °· You have a fever. °THIS IS AN EMERGENCY. Do not wait to see if the pain will go away. Get medical help at once. Call your local emergency services (911 in [place] Do not drive yourself to the hospital. °MAKE SURE YOU:  °· Understand these instructions. °· Will watch your condition. °· Will get help right away if you are not doing well or get worse. °Document Released: 09/07/2005 Document Revised: 02/20/2012 Document Reviewed: 07/03/2008 °[employer]® Patient Information ©2014 [employer]   [employer]. ° °

## 2013-12-24 ENCOUNTER — Other Ambulatory Visit: Payer: Self-pay | Admitting: Nurse Practitioner

## 2013-12-24 ENCOUNTER — Encounter: Payer: Self-pay | Admitting: Nurse Practitioner

## 2013-12-24 ENCOUNTER — Encounter: Payer: Self-pay | Admitting: *Deleted

## 2013-12-24 ENCOUNTER — Encounter (INDEPENDENT_AMBULATORY_CARE_PROVIDER_SITE_OTHER): Payer: Medicare Other

## 2013-12-24 ENCOUNTER — Telehealth: Payer: Self-pay

## 2013-12-24 ENCOUNTER — Ambulatory Visit (INDEPENDENT_AMBULATORY_CARE_PROVIDER_SITE_OTHER): Payer: Medicare Other | Admitting: Nurse Practitioner

## 2013-12-24 VITALS — BP 138/80 | HR 61 | Ht 71.0 in | Wt 187.0 lb

## 2013-12-24 DIAGNOSIS — R072 Precordial pain: Secondary | ICD-10-CM

## 2013-12-24 DIAGNOSIS — R002 Palpitations: Secondary | ICD-10-CM | POA: Diagnosis not present

## 2013-12-24 DIAGNOSIS — R0600 Dyspnea, unspecified: Secondary | ICD-10-CM

## 2013-12-24 DIAGNOSIS — R0609 Other forms of dyspnea: Secondary | ICD-10-CM | POA: Diagnosis not present

## 2013-12-24 DIAGNOSIS — R079 Chest pain, unspecified: Secondary | ICD-10-CM | POA: Diagnosis not present

## 2013-12-24 DIAGNOSIS — R0789 Other chest pain: Secondary | ICD-10-CM

## 2013-12-24 DIAGNOSIS — F1010 Alcohol abuse, uncomplicated: Secondary | ICD-10-CM | POA: Insufficient documentation

## 2013-12-24 DIAGNOSIS — R0989 Other specified symptoms and signs involving the circulatory and respiratory systems: Secondary | ICD-10-CM

## 2013-12-24 DIAGNOSIS — E876 Hypokalemia: Secondary | ICD-10-CM

## 2013-12-24 LAB — BASIC METABOLIC PANEL
BUN: 17 mg/dL (ref 6–23)
CHLORIDE: 102 meq/L (ref 96–112)
CO2: 30 meq/L (ref 19–32)
Calcium: 9 mg/dL (ref 8.4–10.5)
Creatinine, Ser: 0.9 mg/dL (ref 0.4–1.5)
GFR: 91.93 mL/min (ref >60.00)
Glucose, Bld: 87 mg/dL (ref 70–99)
Potassium: 3.6 mEq/L (ref 3.5–5.1)
Sodium: 139 mEq/L (ref 135–145)

## 2013-12-24 LAB — MAGNESIUM: Magnesium: 1.6 mg/dL (ref 1.5–2.5)

## 2013-12-24 LAB — TSH: TSH: 1.5 u[IU]/mL (ref 0.35–5.50)

## 2013-12-24 MED ORDER — POTASSIUM CHLORIDE CRYS ER 20 MEQ PO TBCR: 20.0000 meq | EXTENDED_RELEASE_TABLET | Freq: Every day | ORAL | Status: DC

## 2013-12-24 NOTE — Progress Notes (Signed)
Patient ID: Joseph Larsen, male   DOB: Nov 12, 1946, 68 y.o.   MRN: [id number] E-Cardio 48 hour holter monitor applied to patient.

## 2013-12-24 NOTE — Telephone Encounter (Signed)
Message copied by Lamar Laundry on Tue Dec 24, 2013  4:54 PM ------      Message from: Murray Hodgkins R      Created: Tue Dec 24, 2013  4:36 PM       TSH normal.  Potassium and Magnesium on the low-end of normal.  I'll send in Rx kdur 47meq 1 po daily and will put in order for repeat bmet next week - can be done when he has his echo.  Let him know that K is prob low b/c he takes HCTZ.  Neither K or Mg is so low that it's causing PAC's. ------

## 2013-12-24 NOTE — Telephone Encounter (Signed)
pt given lab results and Allie Bossier, NP instructions.TSH normal.  Potassium and Magnesium on the low-end of normal.  Rx kdur 77meq 1 po daily and will put in order for repeat bmet next week - can be done when he has his echo.  pt verbalized understanding.

## 2013-12-24 NOTE — Progress Notes (Signed)
Patient Name: SHAWNTEZ DICKISON Date of Encounter: 12/24/2013  Primary Care Provider:  Redge Gainer, MD Primary Cardiologist:  P. Johnsie Cancel, MD   Patient Profile  68 year old male with prior history of abnormal EKG and normal stress test in September 2013 presents with a ten-day history of palpitations, chest pressure, and dyspnea.  Problem List   Past Medical History  Diagnosis Date  . MACROCYTIC ANEMIA   . NONSPEC ELEVATION OF LEVELS OF TRANSAMINASE/LDH   . Nonspecific abnormal finding in stool contents   . GERD (gastroesophageal reflux disease)   . HTN (hypertension)   . Anxiety   . Depression   . Allergic rhinitis   . Diverticular disease   . Hemorrhoids   . Hiatal hernia   . Gout   . Low testosterone   . PTSD (post-traumatic stress disorder)   . Abnormal ECG     a. 08/2012 Abnl ETT with ST changes in recovery;  b. 08/2012 Ex MV, ex time 10:00, EF 66%, no ischemia/infarct.  . Palpitations   . Dyspnea on exertion   . ETOH abuse    Past Surgical History  Procedure Laterality Date  . Knee surgery Bilateral   . Cholecystectomy  2004    Allergies  Allergies  Allergen Reactions  . Enalapril Maleate     REACTION: rash  . Hytrin [Terazosin]   . Penicillins     REACTION: rash, itching  . Zestril [Lisinopril]     HPI  68 year old male with prior history of hypertension and remote history of tobacco abuse.  He also drinks 4-5 beers per day and often have peach brandy on top of it.in September 2013, patient was noted to have an abnormal ECG.  He underwent exercise treadmill testing which was abnormal with ST segment depression in recovery.  He subsequently was referred to our office and underwent exercise Myoview, where he exercised for 10 minutes and was found to have normal LV function without evidence of ischemia or infarct.  He has done well since then.  He and his wife exercise 3 times per week, riding a stationary bike and also doing some light weight training.  Over the  past 10 days, he's been experiencing daily intermittent tachypalpitations lasting a few seconds but recurring often throughout the day sometimes associated with chest pressure, dyspnea, or lightheadedness.  He has continued to exercise and has not noted any significant reduction in exercise tolerance chest pressure but at other times during the day, he has had mild chest pressure and or dyspnea.  Both of those symptoms are generally brief in nature and resolved spontaneously.  He was seen by his private care provider yesterday because of symptoms was referred to our office today.  Currently is symptom-free.  His ECG does show PACs however he was asymptomatic while ECG was taken.  He denies pnd, orthopnea, n, v, dizziness, syncope, edema, weight gain, or early satiety.   Home Medications  Prior to Admission medications   Medication Sig Start Date End Date Taking? Authorizing Provider  allopurinol (ZYLOPRIM) 300 MG tablet Take 300 mg by mouth daily.   Yes Historical Provider, MD  amLODipine (NORVASC) 10 MG tablet Take 5 mg by mouth daily.   Yes Historical Provider, MD  aspirin 81 MG tablet Take 81 mg by mouth daily.   Yes Historical Provider, MD  atenolol (TENORMIN) 100 MG tablet Take 100 mg by mouth daily.   Yes Historical Provider, MD  buPROPion (WELLBUTRIN SR) 200 MG 12 hr tablet Take 200 mg  by mouth 2 (two) times daily.   Yes Historical Provider, MD  diazepam (VALIUM) 10 MG tablet Take 1 tablet (10 mg total) by mouth every 8 (eight) hours as needed for anxiety. 02/27/13  Yes Chipper Herb, MD  flunisolide (NASALIDE) 0.025 % SOLN Inhale 2 sprays into the lungs as needed.   Yes Historical Provider, MD  hydrochlorothiazide (HYDRODIURIL) 25 MG tablet Take 25 mg by mouth daily.   Yes Historical Provider, MD  omeprazole (PRILOSEC) 20 MG capsule Take 20 mg by mouth daily.   Yes Historical Provider, MD  QUEtiapine (SEROQUEL) 50 MG tablet Take 25 mg by mouth at bedtime.   Yes Historical Provider, MD    valsartan (DIOVAN) 320 MG tablet Take 320 mg by mouth daily.   Yes Historical Provider, MD    Family History  Family History  Problem Relation Age of Onset  . Heart disease Mother     "died of chf"  . Heart disease Father     "died of old age"  . Hypertension Sister   . Liver disease Brother     Social History  History   Social History  . Marital Status: Married    Spouse Name: Suanne Marker    Number of Children: N/A  . Years of Education: N/A   Occupational History  . disabled    Social History Main Topics  . Smoking status: Former Smoker -- 3.00 packs/day    Types: Cigarettes    Start date: 02/09/1965    Quit date: 05/12/1982  . Smokeless tobacco: Not on file  . Alcohol Use: 4.2 oz/week    7 Cans of beer per week     Comment: drinks 4-5 cans of beer/day and peach brandy daily.   . Drug Use: No     Comment: 1 cup of decaf coffee/day.  Marland Kitchen Sexual Activity: Not on file   Other Topics Concern  . Not on file   Social History Narrative   Lives with wife in [place].  Exercises @ gym 3x/wk - stationary bike and light weight training.     Review of Systems General:  No chills, fever, night sweats or weight changes.  Cardiovascular:  As above, he has been experiencing intermittent chest pressure, palpitations, dyspnea on exertion, and some lightheadedness associated with the palpitations.  He has not had edema, orthopnea, paroxysmal nocturnal dyspnea. Dermatological: No rash, lesions/masses Respiratory: No cough, dyspnea Urologic: No hematuria, dysuria Abdominal:   No nausea, vomiting, diarrhea, bright red blood per rectum, melena, or hematemesis Neurologic:  No visual changes, wkns, changes in mental status. All other systems reviewed and are otherwise negative except as noted above.  Physical Exam  Blood pressure 138/80, pulse 61, height 5\' 11"  (1.803 m), weight 187 lb (84.823 kg).  General: Pleasant, NAD Psych: Normal affect. Neuro: Alert and oriented X 3. Moves  all extremities spontaneously. HEENT: Normal  Neck: Supple without bruits or JVD. Lungs:  Resp regular and unlabored, CTA. Heart: RRR no s3, s4, or murmurs. Abdomen: Soft, non-tender, non-distended, BS + x 4.  Extremities: No clubbing, cyanosis or edema. DP/PT/Radials 2+ and equal bilaterally.  Accessory Clinical Findings  ECG - regular sinus rhythm, PACs, 61, T wave inversion in lead 3 which was present on old ECG.  Assessment & Plan  1.  Palpitations: Patient presents with a ten-day history of intermittent palpitations and sensation of lightheadedness, chest pressure, and/or mild dyspnea.  He does have PACs on his ECG however he is currently asymptomatic.  He is on  a high dose of atenolol therapy at 100 mg daily.  He drinks only one cup of decaf coffee a day but also drinks 4-5 beers per day as well as brandy.  We discussed the importance of alcohol reduction/cessation.  I will arrange for a 48-hour Holter monitor and today we'll check a TSH, basic metabolic profile, and magnesium.  I will also check a 2-D echocardiogram to evaluate his LV function and assess for valvular abnormalities and chamber sizes.  2.  Dyspnea on exertion: Patient has been having some reduction in his exercise tolerance.  As above, and we will check a 2-D echocardiogram to evaluate LV function.  As this had chest pressure as well, we will arrange for a repeat exercise Myoview to rule out ischemia.  3.  Midsternal chest pressure: This is occurring both in the setting of palpitations and separately from palpitations while active.  He describes the discomfort as a fullness.  We'll arrange for exercise Myoview as above.  4.  Hypertension: Stable.  He is on multiple medications.  5.  EtOH abuse: Patient is drinking 4-5 beers per day along with brandy.  We discussed the role that that might play and palpitations and have recommended cessation.  6.  Disposition: Blood work, echo, monitoring, and stress testing as above with  plan to followup with Dr. Johnsie Cancel in 3 weeks.    Murray Hodgkins, NP 12/24/2013, 9:05 AM

## 2013-12-24 NOTE — Patient Instructions (Signed)
Your physician recommends that you continue on your current medications as directed. Please refer to the Current Medication list given to you today.  Labs todays; bmet, tsh, magnesium  Your physician has requested that you have an echocardiogram. Echocardiography is a painless test that uses sound waves to create images of your heart. It provides your doctor with information about the size and shape of your heart and how well your heart's chambers and valves are working. This procedure takes approximately one hour. There are no restrictions for this procedure.  Your physician has recommended that you wear a holter monitor. Holter monitors are medical devices that record the heart's electrical activity. Doctors most often use these monitors to diagnose arrhythmias. Arrhythmias are problems with the speed or rhythm of the heartbeat. The monitor is a small, portable device. You can wear one while you do your normal daily activities. This is usually used to diagnose what is causing palpitations/syncope (passing out).  Your physician has requested that you have en exercise stress myoview. For further information please visit [email or web address]. Please follow instruction sheet, as given.  Your physician recommends that you schedule a follow-up appointment in: 3 weeks with Dr.Nishan

## 2014-01-02 ENCOUNTER — Ambulatory Visit (HOSPITAL_COMMUNITY): Payer: Medicare Other | Attending: Cardiovascular Disease | Admitting: Radiology

## 2014-01-02 VITALS — BP 131/82 | HR 60 | Ht 71.0 in | Wt 184.0 lb

## 2014-01-02 DIAGNOSIS — R079 Chest pain, unspecified: Secondary | ICD-10-CM | POA: Diagnosis not present

## 2014-01-02 DIAGNOSIS — R0602 Shortness of breath: Secondary | ICD-10-CM | POA: Diagnosis not present

## 2014-01-02 MED ORDER — TECHNETIUM TC 99M SESTAMIBI GENERIC - CARDIOLITE
10.0000 | Freq: Once | INTRAVENOUS | Status: AC | PRN
  Administered 2014-01-02: 10 via INTRAVENOUS

## 2014-01-02 MED ORDER — TECHNETIUM TC 99M SESTAMIBI GENERIC - CARDIOLITE
30.0000 | Freq: Once | INTRAVENOUS | Status: AC | PRN
  Administered 2014-01-02: 30 via INTRAVENOUS

## 2014-01-02 NOTE — Progress Notes (Signed)
[facility] [address] [place], [place] [address] [phone]    Cardiology Nuclear Med Study  Joseph Larsen is a 68 y.o. male     MRN : [id number]     DOB: 03-27-1946  Procedure Date: 01/02/2014  Nuclear Med Background Indication for Stress Test:  Evaluation for Ischemia History:  No known CAD, Abn GXT, MPI 2013 (normal) EF 66% Cardiac Risk Factors: History of Smoking and Hypertension  Symptoms:  Chest Pain (last date of chest discomfort was two days ago), Dizziness, DOE and Palpitations   Nuclear Pre-Procedure Caffeine/Decaff Intake:  None NPO After: 6 pm   Lungs:  clear O2 Sat: 96% on room air. IV 0.9% NS with Angio Cath:  22g  IV Site: R Hand  IV Started by:  Crissie Figures, RN  Chest Size (in):  44 Cup Size: n/a  Height: 5\' 11"  (1.803 m)  Weight:  184 lb (83.462 kg)  BMI:  Body mass index is 25.67 kg/(m^2). Tech Comments:  Last Atenolol dose- yesterday at 3pm    Nuclear Med Study 1 or 2 day study: 1 day  Stress Test Type:  Stress  Reading MD: N/A  Order Authorizing Provider:  Jenkins Rouge, MD  Resting Radionuclide: Technetium 31m Sestamibi  Resting Radionuclide Dose: 11.0 mCi   Stress Radionuclide:  Technetium 30m Sestamibi  Stress Radionuclide Dose: 33.0 mCi           Stress Protocol Rest HR: 60 Stress HR: 139  Rest BP: 131/82 Stress BP: 153/79  Exercise Time (min): 9:30 METS: 10.9           Dose of Adenosine (mg):  n/a Dose of Lexiscan: n/a mg  Dose of Atropine (mg): n/a Dose of Dobutamine: n/a mcg/kg/min (at max HR)  Stress Test Technologist: Glade Lloyd, BS-ES  Nuclear Technologist:  Charlton Amor, CNMT     Rest Procedure:  Myocardial perfusion imaging was performed at rest 45 minutes following the intravenous administration of Technetium 38m Sestamibi. Rest ECG: NSR - Normal EKG  Stress Procedure:  The patient exercised on the treadmill utilizing the Bruce Protocol for 9:30 minutes. The patient stopped due to leg and  hip fatigue and denied any chest pain.  Technetium 34m Sestamibi was injected at peak exercise and myocardial perfusion imaging was performed after a brief delay. Stress ECG: No significant ST segment change suggestive of ischemia.  QPS Raw Data Images:  Normal; no motion artifact; normal heart/lung ratio. Stress Images:  Normal homogeneous uptake in all areas of the myocardium. Rest Images:  Normal homogeneous uptake in all areas of the myocardium. Subtraction (SDS):  No evidence of ischemia. Transient Ischemic Dilatation (Normal <1.22):  1.01 Lung/Heart Ratio (Normal <0.45):  0.39  Quantitative Gated Spect Images QGS EDV:  112 ml QGS ESV:  42 ml  Impression Exercise Capacity:  Excellent exercise capacity. BP Response:  Normal blood pressure response. Clinical Symptoms:  No significant symptoms noted. ECG Impression:  No significant ST segment change suggestive of ischemia.  EKG shows mild nonspecific ST-T changes but no ischemic ST depression with exercise. Comparison with Prior Nuclear Study: No significant change from previous study. There is apical thinning on stress and rest images unchanged from prior nuclear study in 2013. No reversible ischemia.   Overall Impression:  Normal stress nuclear study.  LV Ejection Fraction: 63%.  LV Wall Motion:  NL LV Function; NL Wall Motion  [employer]

## 2014-01-06 ENCOUNTER — Telehealth: Payer: Self-pay | Admitting: *Deleted

## 2014-01-06 NOTE — Telephone Encounter (Signed)
PT  NOTIFIED ./CY 

## 2014-01-06 NOTE — Telephone Encounter (Signed)
LEFT MESSAGE FOR  PT  TO CALL  BACK RE MONITOR  SINUS  BRADY TO  SINUS TACHY  MEAN  RATE  65  1 PAUSE AT  1.32 SEC./CY

## 2014-01-23 ENCOUNTER — Ambulatory Visit (INDEPENDENT_AMBULATORY_CARE_PROVIDER_SITE_OTHER): Payer: Medicare Other | Admitting: Cardiovascular Disease

## 2014-01-23 ENCOUNTER — Encounter: Payer: Self-pay | Admitting: Cardiovascular Disease

## 2014-01-23 ENCOUNTER — Other Ambulatory Visit (INDEPENDENT_AMBULATORY_CARE_PROVIDER_SITE_OTHER): Payer: Medicare Other

## 2014-01-23 ENCOUNTER — Ambulatory Visit (HOSPITAL_COMMUNITY): Payer: Medicare Other | Attending: Cardiology | Admitting: Radiology

## 2014-01-23 VITALS — BP 137/83 | HR 60 | Ht 71.0 in | Wt 184.0 lb

## 2014-01-23 DIAGNOSIS — I059 Rheumatic mitral valve disease, unspecified: Secondary | ICD-10-CM | POA: Diagnosis not present

## 2014-01-23 DIAGNOSIS — R079 Chest pain, unspecified: Secondary | ICD-10-CM | POA: Diagnosis not present

## 2014-01-23 DIAGNOSIS — R0600 Dyspnea, unspecified: Secondary | ICD-10-CM

## 2014-01-23 DIAGNOSIS — F1010 Alcohol abuse, uncomplicated: Secondary | ICD-10-CM

## 2014-01-23 DIAGNOSIS — E876 Hypokalemia: Secondary | ICD-10-CM

## 2014-01-23 DIAGNOSIS — R0602 Shortness of breath: Secondary | ICD-10-CM | POA: Diagnosis not present

## 2014-01-23 DIAGNOSIS — R002 Palpitations: Secondary | ICD-10-CM

## 2014-01-23 DIAGNOSIS — Z87891 Personal history of nicotine dependence: Secondary | ICD-10-CM | POA: Diagnosis not present

## 2014-01-23 DIAGNOSIS — I10 Essential (primary) hypertension: Secondary | ICD-10-CM

## 2014-01-23 LAB — BASIC METABOLIC PANEL
BUN: 12 mg/dL (ref 6–23)
CALCIUM: 9.2 mg/dL (ref 8.4–10.5)
CO2: 30 mEq/L (ref 19–32)
CREATININE: 0.8 mg/dL (ref 0.4–1.5)
Chloride: 102 mEq/L (ref 96–112)
GFR: 107.22 mL/min (ref >60.00)
Glucose, Bld: 81 mg/dL (ref 70–99)
Potassium: 3.8 mEq/L (ref 3.5–5.1)
Sodium: 140 mEq/L (ref 135–145)

## 2014-01-23 NOTE — Progress Notes (Signed)
Echocardiogram performed.  

## 2014-01-23 NOTE — Assessment & Plan Note (Signed)
Well controlled.  Continue current medications and low sodium Dash type diet.    

## 2014-01-23 NOTE — Patient Instructions (Signed)
Your physician wants you to follow-up in:   [place] will receive a reminder letter in the mail two months in advance. If you don't receive a letter, please call our office to schedule the follow-up appointment. Your physician recommends that you continue on your current medications as directed. Please refer to the Current Medication list given to you today.

## 2014-01-23 NOTE — Progress Notes (Signed)
Patient ID: Joseph Larsen, male   DOB: [birth year], 68 y.o.   MRN: [id number] 68 year old male with prior history of hypertension and remote history of tobacco abuse. He also drinks 4-5 beers per day and often have peach brandy on top of it.in September 2013, patient was noted to have an abnormal ECG. He underwent exercise treadmill testing which was abnormal with ST segment depression in recovery. He subsequently was referred to our office and underwent exercise Myoview, where he exercised for 10 minutes and was found to have normal LV function without evidence of ischemia or infarct. He has done well since then. He and his wife exercise 3 times per week, riding a stationary bike and also doing some light weight training. Over the past 10 days, he's been experiencing daily intermittent tachypalpitations lasting a few seconds but recurring often throughout the day sometimes associated with chest pressure, dyspnea, or lightheadedness. He has continued to exercise and has not noted any significant reduction in exercise tolerance chest pressure but at other times during the day, he has had mild chest pressure and or dyspnea. Both of those symptoms are generally brief in nature and resolved spontaneously. He was seen by his private care provider yesterday because of symptoms was referred to our office today. Currently is symptom-free. His ECG does show PACs however he was asymptomatic while ECG was taken. He denies pnd, orthopnea, n, v, dizziness, syncope, edema, weight gain, or early satiety.  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  All other systems reviewed and negative  General: Affect appropriate Healthy:  appears stated  age 18: normal Neck supple with no adenopathy JVP normal no bruits no thyromegaly Lungs clear with no wheezing and good diaphragmatic motion Heart:  S1/S2 no murmur, no rub, gallop or click PMI normal Abdomen: benighn, BS positve, no tenderness, no AAA no bruit.  No HSM or HJR Distal pulses intact with no bruits No edema Neuro non-focal Skin warm and dry No muscular weakness   Current Outpatient Prescriptions  Medication Sig Dispense Refill  . allopurinol (ZYLOPRIM) 300 MG tablet Take 300 mg by mouth daily.      Marland Kitchen amLODipine (NORVASC) 10 MG tablet Take 5 mg by mouth daily.      Marland Kitchen aspirin 81 MG tablet Take 81 mg by mouth daily.      Marland Kitchen atenolol (TENORMIN) 100 MG tablet Take 100 mg by mouth daily.      Marland Kitchen buPROPion (WELLBUTRIN SR) 200 MG 12 hr tablet Take 200 mg by mouth 2 (two) times daily.      . diazepam (VALIUM) 10 MG tablet Take 1 tablet (10 mg total) by mouth every 8 (eight) hours as needed for anxiety.  90 tablet  1  . flunisolide (NASALIDE) 0.025 % SOLN Inhale 2 sprays into the lungs as needed.      . hydrochlorothiazide (HYDRODIURIL) 25 MG tablet Take 25 mg by mouth daily.      Marland Kitchen omeprazole (PRILOSEC) 20 MG capsule Take 20 mg by mouth daily.      . potassium chloride SA (K-DUR,KLOR-CON) 20 MEQ tablet Take 1 tablet (20 mEq total) by mouth daily.  30 tablet  6  . QUEtiapine (SEROQUEL) 50 MG tablet Take 25 mg by mouth at bedtime.      Marland Kitchen  valsartan (DIOVAN) 320 MG tablet Take 320 mg by mouth daily.       No current facility-administered medications for this visit.    Allergies  Enalapril maleate; Hytrin; Penicillins; and Zestril  Electrocardiogram:  SR rate 61 PAC   Assessment and Plan

## 2014-01-23 NOTE — Assessment & Plan Note (Signed)
Counseled Discussed with wife as well F/U primary for LFTls

## 2014-01-23 NOTE — Assessment & Plan Note (Signed)
Holter benign PACls likely related to ETOH Continue beta blocker

## 2014-01-23 NOTE — Assessment & Plan Note (Signed)
?   Related to GERD Stress myovue normal ASA

## 2014-03-03 ENCOUNTER — Ambulatory Visit (INDEPENDENT_AMBULATORY_CARE_PROVIDER_SITE_OTHER): Payer: Medicare Other | Admitting: Family Medicine

## 2014-03-03 ENCOUNTER — Encounter: Payer: Self-pay | Admitting: Family Medicine

## 2014-03-03 VITALS — BP 144/86 | HR 54 | Temp 97.4°F | Ht 71.0 in | Wt 186.0 lb

## 2014-03-03 DIAGNOSIS — E876 Hypokalemia: Secondary | ICD-10-CM | POA: Diagnosis not present

## 2014-03-03 DIAGNOSIS — Z0000 Encounter for general adult medical examination without abnormal findings: Secondary | ICD-10-CM | POA: Diagnosis not present

## 2014-03-03 DIAGNOSIS — E559 Vitamin D deficiency, unspecified: Secondary | ICD-10-CM | POA: Diagnosis not present

## 2014-03-03 DIAGNOSIS — Z23 Encounter for immunization: Secondary | ICD-10-CM | POA: Diagnosis not present

## 2014-03-03 DIAGNOSIS — S90411A Abrasion, right great toe, initial encounter: Secondary | ICD-10-CM

## 2014-03-03 DIAGNOSIS — I10 Essential (primary) hypertension: Secondary | ICD-10-CM

## 2014-03-03 DIAGNOSIS — K219 Gastro-esophageal reflux disease without esophagitis: Secondary | ICD-10-CM | POA: Diagnosis not present

## 2014-03-03 DIAGNOSIS — Z125 Encounter for screening for malignant neoplasm of prostate: Secondary | ICD-10-CM | POA: Diagnosis not present

## 2014-03-03 DIAGNOSIS — R21 Rash and other nonspecific skin eruption: Secondary | ICD-10-CM | POA: Diagnosis not present

## 2014-03-03 DIAGNOSIS — N400 Enlarged prostate without lower urinary tract symptoms: Secondary | ICD-10-CM | POA: Insufficient documentation

## 2014-03-03 LAB — POCT URINALYSIS DIPSTICK
BILIRUBIN UA: NEGATIVE
Blood, UA: NEGATIVE
Glucose, UA: NEGATIVE
KETONES UA: NEGATIVE
LEUKOCYTES UA: NEGATIVE
Nitrite, UA: NEGATIVE
Protein, UA: NEGATIVE
SPEC GRAV UA: 1.015
Urobilinogen, UA: NEGATIVE
pH, UA: 6.5

## 2014-03-03 LAB — POCT CBC
GRANULOCYTE PERCENT: 60.1 % (ref 37–80)
HEMATOCRIT: 50.7 % (ref 43.5–53.7)
HEMOGLOBIN: 16.2 g/dL (ref 14.1–18.1)
Lymph, poc: 1.8 (ref 0.6–3.4)
MCH, POC: 32.9 pg — AB (ref 27–31.2)
MCHC: 32 g/dL (ref 31.8–35.4)
MCV: 102.7 fL — AB (ref 80–97)
MPV: 8 fL (ref 0–99.8)
POC Granulocyte: 3.1 (ref 2–6.9)
POC LYMPH PERCENT: 34.5 %L (ref 10–50)
Platelet Count, POC: 143 10*3/uL (ref 142–424)
RBC: 4.9 M/uL (ref 4.69–6.13)
RDW, POC: 12.9 %
WBC: 5.1 10*3/uL (ref 4.6–10.2)

## 2014-03-03 LAB — POCT UA - MICROSCOPIC ONLY
BACTERIA, U MICROSCOPIC: NEGATIVE
Casts, Ur, LPF, POC: NEGATIVE
Crystals, Ur, HPF, POC: NEGATIVE
MUCUS UA: NEGATIVE
RBC, urine, microscopic: NEGATIVE
WBC, UR, HPF, POC: NEGATIVE
Yeast, UA: NEGATIVE

## 2014-03-03 LAB — POCT WET PREP (WET MOUNT)
KOH WET PREP POC: NEGATIVE
WBC WET PREP: NEGATIVE

## 2014-03-03 MED ORDER — NYSTATIN 100000 UNIT/ML MT SUSP: 5.0000 mL | Freq: Four times a day (QID) | OROMUCOSAL | Status: DC

## 2014-03-03 NOTE — Progress Notes (Signed)
Subjective:    Patient ID: Joseph Larsen, male    DOB: 08-03-1946, 68 y.o.   MRN: [id number]  HPI Patient is here today for annual wellness exam and follow up of chronic medical problems. The patient comes in today for his yearly physical exam and review of his multiple medical problems. He would be due to get a prostate exam today and FOBT and routine lab work. He will also be given a Prevnar vaccine. As indicated below he has no particular complaints. He has had recently a stress test by   cardiology. The patient had an essentially normal EKG and stress test.         Patient Active Problem List   Diagnosis Date Noted  . Chest pain   . Palpitations   . Dyspnea on exertion   . ETOH abuse   . PTSD (post-traumatic stress disorder) 10/02/2013  . Abnormal stress test 08/29/2012  . GERD (gastroesophageal reflux disease)   . HTN (hypertension)   . Anxiety   . Depression   . Allergic rhinitis   . Diverticular disease   . Hemorrhoids   . Hiatal hernia   . Gout   . MACROCYTIC ANEMIA 08/09/2010  . NONSPEC ELEVATION OF LEVELS OF TRANSAMINASE/LDH 08/09/2010  . NONSPECIFIC ABNORMAL FINDING IN STOOL CONTENTS 08/09/2010   Outpatient Encounter Prescriptions as of 03/03/2014  Medication Sig  . allopurinol (ZYLOPRIM) 300 MG tablet Take 300 mg by mouth daily.  Marland Kitchen amLODipine (NORVASC) 10 MG tablet Take 5 mg by mouth daily.  Marland Kitchen aspirin 81 MG tablet Take 81 mg by mouth daily.  Marland Kitchen atenolol (TENORMIN) 100 MG tablet Take 100 mg by mouth daily.  Marland Kitchen buPROPion (WELLBUTRIN SR) 200 MG 12 hr tablet Take 200 mg by mouth 2 (two) times daily.  . cholecalciferol (VITAMIN D) 1000 UNITS tablet Take 1,000 Units by mouth daily.  . diazepam (VALIUM) 10 MG tablet Take 1 tablet (10 mg total) by mouth every 8 (eight) hours as needed for anxiety.  . flunisolide (NASALIDE) 0.025 % SOLN Inhale 2 sprays into the lungs as needed.  . hydrochlorothiazide (HYDRODIURIL) 25 MG tablet Take 25 mg by mouth daily.  . Omega-3 Fatty  Acids (FISH OIL) 1000 MG CAPS Take 4,000 capsules by mouth daily.  Marland Kitchen omeprazole (PRILOSEC) 20 MG capsule Take 20 mg by mouth daily.  . potassium chloride SA (K-DUR,KLOR-CON) 20 MEQ tablet Take 1 tablet (20 mEq total) by mouth daily.  . QUEtiapine (SEROQUEL) 50 MG tablet Take 25 mg by mouth at bedtime.  . valsartan (DIOVAN) 320 MG tablet Take 320 mg by mouth daily.    Review of Systems  Constitutional: Negative.   HENT: Negative.   Eyes: Negative.   Respiratory: Negative.   Cardiovascular: Negative.   Gastrointestinal: Negative.   Endocrine: Negative.   Genitourinary: Negative.   Musculoskeletal: Negative.   Skin: Negative.   Allergic/Immunologic: Negative.   Neurological: Negative.   Hematological: Negative.   Psychiatric/Behavioral: Negative.        Objective:   Physical Exam  Nursing note and vitals reviewed. Constitutional: He is oriented to person, place, and time. He appears well-developed and well-nourished. No distress.  HENT:  Head: Normocephalic and atraumatic.  Right Ear: External ear normal.  Left Ear: External ear normal.  Mouth/Throat: Oropharynx is clear and moist. No oropharyngeal exudate.  Nasal congestion bilateral  Eyes: Conjunctivae and EOM are normal. Pupils are equal, round, and reactive to light. Right eye exhibits no discharge. Left eye exhibits no discharge. No  scleral icterus.  Neck: Normal range of motion. Neck supple. No thyromegaly present.  No carotid bruits  Cardiovascular: Normal rate, regular rhythm, normal heart sounds and intact distal pulses.  Exam reveals no gallop and no friction rub.   No murmur heard. At 72 per minute  Pulmonary/Chest: Effort normal and breath sounds normal. No respiratory distress. He has no wheezes. He has no rales. He exhibits no tenderness.  Abdominal: Soft. Bowel sounds are normal. He exhibits no mass. There is no tenderness. There is no rebound and no guarding.  Genitourinary: Rectum normal and penis normal.    The prostate was enlarged and smooth the right side was greater than the left there were no lumps or masses. There were no rectal masses. There is no inguinal hernia or inguinal nodes palpable. The external genitalia were within normal limits.  Musculoskeletal: Normal range of motion. He exhibits no edema and no tenderness.  Lymphadenopathy:    He has no cervical adenopathy.  Neurological: He is alert and oriented to person, place, and time. He has normal reflexes. No cranial nerve deficit.  Skin: Skin is warm and dry. Rash noted. No erythema. No pallor.  Circumferential scaly areas on left leg pain he did medical size and diameter. There was also a geographic type tongue with surrounding area of whiteness  Psychiatric: He has a normal mood and affect. His behavior is normal. Judgment and thought content normal.   BP 144/86  Pulse 54  Temp(Src) 97.4 F (36.3 C) (Oral)  Ht _0  (1.803 m)  Wt 186 lb (84.369 kg)  BMI 25.95 kg/m2        Assessment & Plan:  1. GERD (gastroesophageal reflux disease) - Hepatic function panel - POCT CBC  2. HTN (hypertension) - BMP8+EGFR - Hepatic function panel - POCT CBC  3. Annual physical exam - BMP8+EGFR - Hepatic function panel - POCT CBC - POCT UA - Microscopic Only - POCT urinalysis dipstick - NMR, lipoprofile - PSA, total and free - Vit D  25 hydroxy (rtn osteoporosis monitoring) - POCT Wet Prep [employer])  4. Vitamin D deficiency - POCT CBC - Vit D  25 hydroxy (rtn osteoporosis monitoring)  5. Hypokalemia  6. Rash and nonspecific skin eruption  7. Abrasion of great toe, right  8. BPH (benign prostatic hyperplasia)  Meds ordered this encounter  Medications  . cholecalciferol (VITAMIN D) 1000 UNITS tablet    Sig: Take 1,000 Units by mouth daily.  . Omega-3 Fatty Acids (FISH OIL) 1000 MG CAPS    Sig: Take 4,000 capsules by mouth daily.  Marland Kitchen nystatin (MYCOSTATIN) 100000 UNIT/ML suspension    Sig: Take 5 mLs (500,000 Units  total) by mouth 4 (four) times daily. Before meals and at bedtime. Swish and swallow    Dispense:  120 mL    Refill:  1      Patient Instructions                       Medicare Annual Wellness Visit  [facility] and the medical providers at [facility] strive to bring you the best medical care.  In doing so we not only want to address your current medical conditions and concerns but also to detect new conditions early and prevent illness, disease and health-related problems.    Medicare offers a yearly Wellness Visit which allows our clinical staff to assess your need for preventative services including immunizations, lifestyle education, counseling to decrease risk of  preventable diseases and screening for fall risk and other medical concerns.    This visit is provided free of charge (no copay) for all Medicare recipients. The clinical pharmacists at [facility] have begun to conduct these Wellness Visits which will also include a thorough review of all your medications.    As you primary medical provider recommend that you make an appointment for your Annual Wellness Visit if you have not done so already this year.  You may set up this appointment before you leave today or you may call back ([phone]) and schedule an appointment.  Please make sure when you call that you mention that you are scheduling your Annual Wellness Visit with the clinical pharmacist so that the appointment may be made for the proper length of time.     Continue current medications. Continue good therapeutic lifestyle changes which include good diet and exercise. Fall precautions discussed with patient. If an FOBT was given today- please return it to our front desk. If you are over 40 years old - you may need Prevnar 52 or the adult Pneumonia vaccine.  Continue medications as directed Use the Mycostatin oral suspension, swish and swallow 1 teaspoon 4 times daily after  meals and at bedtime Continue the potassium as doing until the lab results are back The Prevnar vaccine may make your arm sore Use cortisone 10 over-the-counter lightly 2-3 times daily to rash on leg   Arrie Senate MD

## 2014-03-03 NOTE — Patient Instructions (Addendum)
Medicare Annual Wellness Visit  [facility] and the medical providers at [facility] strive to bring you the best medical care.  In doing so we not only want to address your current medical conditions and concerns but also to detect new conditions early and prevent illness, disease and health-related problems.    Medicare offers a yearly Wellness Visit which allows our clinical staff to assess your need for preventative services including immunizations, lifestyle education, counseling to decrease risk of preventable diseases and screening for fall risk and other medical concerns.    This visit is provided free of charge (no copay) for all Medicare recipients. The clinical pharmacists at [facility] have begun to conduct these Wellness Visits which will also include a thorough review of all your medications.    As you primary medical provider recommend that you make an appointment for your Annual Wellness Visit if you have not done so already this year.  You may set up this appointment before you leave today or you may call back ([phone]) and schedule an appointment.  Please make sure when you call that you mention that you are scheduling your Annual Wellness Visit with the clinical pharmacist so that the appointment may be made for the proper length of time.     Continue current medications. Continue good therapeutic lifestyle changes which include good diet and exercise. Fall precautions discussed with patient. If an FOBT was given today- please return it to our front desk. If you are over 1 years old - you may need Prevnar 58 or the adult Pneumonia vaccine.  Continue medications as directed Use the Mycostatin oral suspension, swish and swallow 1 teaspoon 4 times daily after meals and at bedtime Continue the potassium as doing until the lab results are back The Prevnar vaccine may make your arm sore Use cortisone 10  over-the-counter lightly 2-3 times daily to rash on leg

## 2014-03-03 NOTE — Addendum Note (Signed)
Addended by: Zannie Cove on: 03/03/2014 11:04 AM   Modules accepted: Orders

## 2014-03-05 LAB — BMP8+EGFR
BUN/Creatinine Ratio: 19 (ref 10–22)
BUN: 15 mg/dL (ref 8–27)
CALCIUM: 9.5 mg/dL (ref 8.6–10.2)
CO2: 22 mmol/L (ref 18–29)
Chloride: 100 mmol/L (ref 97–108)
Creatinine, Ser: 0.79 mg/dL (ref 0.76–1.27)
GFR calc Af Amer: 108 mL/min/{1.73_m2} (ref >59)
GFR calc non Af Amer: 94 mL/min/{1.73_m2} (ref >59)
GLUCOSE: 88 mg/dL (ref 65–99)
POTASSIUM: 3.8 mmol/L (ref 3.5–5.2)
Sodium: 145 mmol/L — ABNORMAL HIGH (ref 134–144)

## 2014-03-05 LAB — VITAMIN D 25 HYDROXY (VIT D DEFICIENCY, FRACTURES): Vit D, 25-Hydroxy: 89.9 ng/mL (ref 30.0–100.0)

## 2014-03-05 LAB — HEPATIC FUNCTION PANEL
ALK PHOS: 61 IU/L (ref 39–117)
ALT: 23 IU/L (ref 0–44)
AST: 29 IU/L (ref 0–40)
Albumin: 4.7 g/dL (ref 3.6–4.8)
BILIRUBIN DIRECT: 0.21 mg/dL (ref 0.00–0.40)
BILIRUBIN TOTAL: 0.6 mg/dL (ref 0.0–1.2)
TOTAL PROTEIN: 6.6 g/dL (ref 6.0–8.5)

## 2014-03-05 LAB — NMR, LIPOPROFILE
Cholesterol: 120 mg/dL (ref <200)
HDL CHOLESTEROL BY NMR: 40 mg/dL (ref >=40)
HDL PARTICLE NUMBER: 29.7 umol/L — AB (ref >=30.5)
LDL Particle Number: 844 nmol/L (ref <1000)
LDL Size: 21.1 nm (ref >20.5)
LDLC SERPL CALC-MCNC: 65 mg/dL (ref <100)
LP-IR Score: 69 — ABNORMAL HIGH (ref <=45)
SMALL LDL PARTICLE NUMBER: 299 nmol/L (ref <=527)
TRIGLYCERIDES BY NMR: 76 mg/dL (ref <150)

## 2014-03-05 LAB — PSA, TOTAL AND FREE
PSA FREE: 0.24 ng/mL
PSA, Free Pct: 40 %
PSA: 0.6 ng/mL (ref 0.0–4.0)

## 2014-03-08 ENCOUNTER — Ambulatory Visit (INDEPENDENT_AMBULATORY_CARE_PROVIDER_SITE_OTHER): Payer: Medicare Other | Admitting: General Practice

## 2014-03-08 VITALS — BP 145/83 | HR 56 | Temp 97.5°F | Ht 71.0 in | Wt 186.6 lb

## 2014-03-08 DIAGNOSIS — L03031 Cellulitis of right toe: Secondary | ICD-10-CM

## 2014-03-08 DIAGNOSIS — L02619 Cutaneous abscess of unspecified foot: Secondary | ICD-10-CM

## 2014-03-08 DIAGNOSIS — L03039 Cellulitis of unspecified toe: Secondary | ICD-10-CM

## 2014-03-08 MED ORDER — SULFAMETHOXAZOLE-TMP DS 800-160 MG PO TABS: 1.0000 | ORAL_TABLET | Freq: Two times a day (BID) | ORAL | Status: DC

## 2014-03-08 NOTE — Patient Instructions (Signed)
Cellulitis Cellulitis is an infection of the skin and the tissue beneath it. The infected area is usually red and tender. Cellulitis occurs most often in the arms and lower legs.  CAUSES  Cellulitis is caused by bacteria that enter the skin through cracks or cuts in the skin. The most common types of bacteria that cause cellulitis are Staphylococcus and Streptococcus. SYMPTOMS   Redness and warmth.  Swelling.  Tenderness or pain.  Fever. DIAGNOSIS  Your caregiver can usually determine what is wrong based on a physical exam. Blood tests may also be done. TREATMENT  Treatment usually involves taking an antibiotic medicine. HOME CARE INSTRUCTIONS   Take your antibiotics as directed. Finish them even if you start to feel better.  Keep the infected arm or leg elevated to reduce swelling.  Apply a warm cloth to the affected area up to 4 times per day to relieve pain.  Only take over-the-counter or prescription medicines for pain, discomfort, or fever as directed by your caregiver.  Keep all follow-up appointments as directed by your caregiver. SEEK MEDICAL CARE IF:   You notice red streaks coming from the infected area.  Your red area gets larger or turns dark in color.  Your bone or joint underneath the infected area becomes painful after the skin has healed.  Your infection returns in the same area or another area.  You notice a swollen bump in the infected area.  You develop new symptoms. SEEK IMMEDIATE MEDICAL CARE IF:   You have a fever.  You feel very sleepy.  You develop vomiting or diarrhea.  You have a general ill feeling (malaise) with muscle aches and pains. MAKE SURE YOU:   Understand these instructions.  Will watch your condition.  Will get help right away if you are not doing well or get worse. Document Released: 09/07/2005 Document Revised: 05/29/2012 Document Reviewed: 02/13/2012 ExitCare Patient Information 2014 [employer].  

## 2014-03-08 NOTE — Progress Notes (Addendum)
   Subjective:    Patient ID: Joseph Larsen, male    DOB: [birth year], 68 y.o.   MRN: [id number]  HPI Patient presents with complaints of right great toe tenderness and redness. Reports dropping a can on toe one week ago and symptoms gradually worsened. He soaked in warm salt water last night, verbalized feeling better.     Review of Systems  Constitutional: Negative for fever and chills.  Respiratory: Negative for chest tightness and shortness of breath.   Cardiovascular: Negative for chest pain and palpitations.  Skin:       Tenderness and redness to right great toe       Objective:   Physical Exam  Constitutional: He appears well-developed and well-nourished.  Cardiovascular: Normal rate, regular rhythm and normal heart sounds.   Pulmonary/Chest: Effort normal and breath sounds normal.  Skin: Skin is warm and dry.  Erythema and tenderness noted to right great toe, 1/4 x 1/4 inch. Negative drainage or warmth.           Assessment & Plan:  1. Cellulitis of great toe, right - sulfamethoxazole-trimethoprim (BACTRIM DS) 800-160 MG per tablet; Take 1 tablet by mouth 2 (two) times daily.  Dispense: 20 tablet; Refill: 0 -keep clean and dry -RTO if symptoms worsen or unresolved Patient verbalized understanding Erby Pian, FNP-C

## 2014-05-07 DIAGNOSIS — H40039 Anatomical narrow angle, unspecified eye: Secondary | ICD-10-CM | POA: Diagnosis not present

## 2014-05-07 DIAGNOSIS — H2510 Age-related nuclear cataract, unspecified eye: Secondary | ICD-10-CM | POA: Diagnosis not present

## 2014-07-17 ENCOUNTER — Encounter: Payer: Self-pay | Admitting: Family Medicine

## 2014-07-17 ENCOUNTER — Ambulatory Visit (INDEPENDENT_AMBULATORY_CARE_PROVIDER_SITE_OTHER): Payer: Medicare Other | Admitting: Family Medicine

## 2014-07-17 VITALS — BP 140/86 | HR 56 | Temp 97.1°F | Ht 71.0 in | Wt 188.0 lb

## 2014-07-17 DIAGNOSIS — I10 Essential (primary) hypertension: Secondary | ICD-10-CM | POA: Diagnosis not present

## 2014-07-17 DIAGNOSIS — F4310 Post-traumatic stress disorder, unspecified: Secondary | ICD-10-CM

## 2014-07-17 DIAGNOSIS — K219 Gastro-esophageal reflux disease without esophagitis: Secondary | ICD-10-CM | POA: Diagnosis not present

## 2014-07-17 DIAGNOSIS — E559 Vitamin D deficiency, unspecified: Secondary | ICD-10-CM

## 2014-07-17 DIAGNOSIS — N400 Enlarged prostate without lower urinary tract symptoms: Secondary | ICD-10-CM | POA: Diagnosis not present

## 2014-07-17 LAB — POCT CBC
Granulocyte percent: 59.6 %G (ref 37–80)
HCT, POC: 46.1 % (ref 43.5–53.7)
Hemoglobin: 15.4 g/dL (ref 14.1–18.1)
LYMPH, POC: 2.1 (ref 0.6–3.4)
MCH: 34.1 pg — AB (ref 27–31.2)
MCHC: 33.4 g/dL (ref 31.8–35.4)
MCV: 102 fL — AB (ref 80–97)
MPV: 7.9 fL (ref 0–99.8)
PLATELET COUNT, POC: 157 10*3/uL (ref 142–424)
POC Granulocyte: 3.5 (ref 2–6.9)
POC LYMPH %: 34.8 % (ref 10–50)
RBC: 4.5 M/uL — AB (ref 4.69–6.13)
RDW, POC: 13.3 %
WBC: 5.9 10*3/uL (ref 4.6–10.2)

## 2014-07-17 NOTE — Progress Notes (Signed)
Subjective:    Patient ID: Joseph Larsen, male    DOB: Mar 19, 1946, 68 y.o.   MRN: [id number]  HPI Pt here for follow up and management of chronic medical problems. The patient is doing well today with no specific complaints. He is due for lab work today and has an FOBT at home which he needs to return.          Patient Active Problem List   Diagnosis Date Noted  . Hypokalemia 03/03/2014  . BPH (benign prostatic hyperplasia) 03/03/2014  . Chest pain   . Palpitations   . Dyspnea on exertion   . ETOH abuse   . PTSD (post-traumatic stress disorder) 10/02/2013  . Abnormal stress test 08/29/2012  . GERD (gastroesophageal reflux disease)   . HTN (hypertension)   . Anxiety   . Depression   . Allergic rhinitis   . Diverticular disease   . Hemorrhoids   . Hiatal hernia   . Gout   . MACROCYTIC ANEMIA 08/09/2010  . NONSPEC ELEVATION OF LEVELS OF TRANSAMINASE/LDH 08/09/2010  . NONSPECIFIC ABNORMAL FINDING IN STOOL CONTENTS 08/09/2010   Outpatient Encounter Prescriptions as of 07/17/2014  Medication Sig  . allopurinol (ZYLOPRIM) 300 MG tablet Take 300 mg by mouth daily.  Marland Kitchen amLODipine (NORVASC) 10 MG tablet Take 5 mg by mouth daily.  Marland Kitchen aspirin 81 MG tablet Take 81 mg by mouth daily.  Marland Kitchen atenolol (TENORMIN) 100 MG tablet Take 100 mg by mouth daily.  Marland Kitchen buPROPion (WELLBUTRIN SR) 200 MG 12 hr tablet Take 200 mg by mouth 2 (two) times daily.  . cholecalciferol (VITAMIN D) 1000 UNITS tablet Take 1,000 Units by mouth daily.  . diazepam (VALIUM) 10 MG tablet Take 1 tablet (10 mg total) by mouth every 8 (eight) hours as needed for anxiety.  . flunisolide (NASALIDE) 0.025 % SOLN Inhale 2 sprays into the lungs as needed.  . hydrochlorothiazide (HYDRODIURIL) 25 MG tablet Take 25 mg by mouth daily.  . Omega-3 Fatty Acids (FISH OIL) 1000 MG CAPS Take 4,000 capsules by mouth daily.  Marland Kitchen omeprazole (PRILOSEC) 20 MG capsule Take 20 mg by mouth daily.  . potassium chloride SA (K-DUR,KLOR-CON) 20 MEQ  tablet Take 1 tablet (20 mEq total) by mouth daily.  . QUEtiapine (SEROQUEL) 50 MG tablet Take 25 mg by mouth at bedtime.  . valsartan (DIOVAN) 320 MG tablet Take 320 mg by mouth daily.  . [DISCONTINUED] nystatin (MYCOSTATIN) 100000 UNIT/ML suspension Take 5 mLs (500,000 Units total) by mouth 4 (four) times daily. Before meals and at bedtime. Swish and swallow  . [DISCONTINUED] sulfamethoxazole-trimethoprim (BACTRIM DS) 800-160 MG per tablet Take 1 tablet by mouth 2 (two) times daily.    Review of Systems  Constitutional: Negative.   HENT: Negative.   Eyes: Negative.   Respiratory: Negative.   Cardiovascular: Negative.   Gastrointestinal: Negative.   Endocrine: Negative.   Genitourinary: Negative.   Musculoskeletal: Negative.   Skin: Negative.   Allergic/Immunologic: Negative.   Neurological: Negative.   Hematological: Negative.   Psychiatric/Behavioral: Negative.        Objective:   Physical Exam  Nursing note and vitals reviewed. Constitutional: He is oriented to person, place, and time. He appears well-developed and well-nourished. No distress.  HENT:  Head: Normocephalic and atraumatic.  Right Ear: External ear normal.  Left Ear: External ear normal.  Nose: Nose normal.  Mouth/Throat: Oropharynx is clear and moist. No oropharyngeal exudate.  Eyes: Conjunctivae and EOM are normal. Pupils are equal, round, and reactive  to light. Right eye exhibits no discharge. Left eye exhibits no discharge. No scleral icterus.  Neck: Normal range of motion. Neck supple. No thyromegaly present.  Cardiovascular: Normal rate, regular rhythm, normal heart sounds and intact distal pulses.  Exam reveals no gallop and no friction rub.   No murmur heard. At 60 per minute  Pulmonary/Chest: Effort normal and breath sounds normal. No respiratory distress. He has no wheezes. He has no rales. He exhibits no tenderness.  Abdominal: Soft. Bowel sounds are normal. He exhibits no mass. There is no  tenderness. There is no rebound and no guarding.  Musculoskeletal: Normal range of motion. He exhibits no edema and no tenderness.  Lymphadenopathy:    He has no cervical adenopathy.  Neurological: He is alert and oriented to person, place, and time. He has normal reflexes. No cranial nerve deficit.  Skin: Skin is warm and dry. No rash noted. No erythema. No pallor.  Psychiatric: He has a normal mood and affect. His behavior is normal. Judgment and thought content normal.   BP 140/86  Pulse 56  Temp(Src) 97.1 F (36.2 C) (Oral)  Ht _0  (1.803 m)  Wt 188 lb (85.276 kg)  BMI 26.23 kg/m2        Assessment & Plan:  1. BPH (benign prostatic hyperplasia) - POCT CBC  2. Gastroesophageal reflux disease, esophagitis presence not specified - POCT CBC  3. Essential hypertension - POCT CBC - BMP8+EGFR - Hepatic function panel - NMR, lipoprofile  4. Vitamin D deficiency - Vit D  25 hydroxy (rtn osteoporosis monitoring)  5. PTSD (post-traumatic stress disorder) -[facility]  No orders of the defined types were placed in this encounter.   Patient Instructions                       Medicare Annual Wellness Visit  [facility] and the medical providers at [facility] strive to bring you the best medical care.  In doing so we not only want to address your current medical conditions and concerns but also to detect new conditions early and prevent illness, disease and health-related problems.    Medicare offers a yearly Wellness Visit which allows our clinical staff to assess your need for preventative services including immunizations, lifestyle education, counseling to decrease risk of preventable diseases and screening for fall risk and other medical concerns.    This visit is provided free of charge (no copay) for all Medicare recipients. The clinical pharmacists at [facility] have begun to conduct these Wellness Visits  which will also include a thorough review of all your medications.    As you primary medical provider recommend that you make an appointment for your Annual Wellness Visit if you have not done so already this year.  You may set up this appointment before you leave today or you may call back ([phone]) and schedule an appointment.  Please make sure when you call that you mention that you are scheduling your Annual Wellness Visit with the clinical pharmacist so that the appointment may be made for the proper length of time.     Continue current medications. Continue good therapeutic lifestyle changes which include good diet and exercise. Fall precautions discussed with patient. If an FOBT was given today- please return it to our front desk. If you are over 53 years old - you may need Prevnar 16 or the adult Pneumonia vaccine.  Please return the FOBT  we will call you with the lab work results once those results are available     Arrie Senate MD

## 2014-07-17 NOTE — Patient Instructions (Addendum)
Medicare Annual Wellness Visit  [facility] and the medical providers at [facility] strive to bring you the best medical care.  In doing so we not only want to address your current medical conditions and concerns but also to detect new conditions early and prevent illness, disease and health-related problems.    Medicare offers a yearly Wellness Visit which allows our clinical staff to assess your need for preventative services including immunizations, lifestyle education, counseling to decrease risk of preventable diseases and screening for fall risk and other medical concerns.    This visit is provided free of charge (no copay) for all Medicare recipients. The clinical pharmacists at [facility] have begun to conduct these Wellness Visits which will also include a thorough review of all your medications.    As you primary medical provider recommend that you make an appointment for your Annual Wellness Visit if you have not done so already this year.  You may set up this appointment before you leave today or you may call back ([phone]) and schedule an appointment.  Please make sure when you call that you mention that you are scheduling your Annual Wellness Visit with the clinical pharmacist so that the appointment may be made for the proper length of time.     Continue current medications. Continue good therapeutic lifestyle changes which include good diet and exercise. Fall precautions discussed with patient. If an FOBT was given today- please return it to our front desk. If you are over 8 years old - you may need Prevnar 52 or the adult Pneumonia vaccine.  Please return the FOBT  we will call you with the lab work results once those results are available

## 2014-07-18 LAB — NMR, LIPOPROFILE
Cholesterol: 104 mg/dL (ref 100–199)
HDL Cholesterol by NMR: 36 mg/dL — ABNORMAL LOW (ref >39)
HDL Particle Number: 29.7 umol/L — ABNORMAL LOW (ref >=30.5)
LDL PARTICLE NUMBER: 685 nmol/L (ref <1000)
LDL SIZE: 20.4 nm (ref >20.5)
LDLC SERPL CALC-MCNC: 54 mg/dL (ref 0–99)
LP-IR SCORE: 74 — AB (ref <=45)
Small LDL Particle Number: 336 nmol/L (ref <=527)
TRIGLYCERIDES BY NMR: 69 mg/dL (ref 0–149)

## 2014-07-18 LAB — BMP8+EGFR
BUN / CREAT RATIO: 13 (ref 10–22)
BUN: 11 mg/dL (ref 8–27)
CALCIUM: 9.3 mg/dL (ref 8.6–10.2)
CO2: 28 mmol/L (ref 18–29)
Chloride: 97 mmol/L (ref 97–108)
Creatinine, Ser: 0.84 mg/dL (ref 0.76–1.27)
GFR calc Af Amer: 105 mL/min/{1.73_m2} (ref >59)
GFR calc non Af Amer: 91 mL/min/{1.73_m2} (ref >59)
Glucose: 76 mg/dL (ref 65–99)
POTASSIUM: 4.1 mmol/L (ref 3.5–5.2)
Sodium: 140 mmol/L (ref 134–144)

## 2014-07-18 LAB — HEPATIC FUNCTION PANEL
ALT: 24 IU/L (ref 0–44)
AST: 29 IU/L (ref 0–40)
Albumin: 4.5 g/dL (ref 3.6–4.8)
Alkaline Phosphatase: 50 IU/L (ref 39–117)
BILIRUBIN DIRECT: 0.18 mg/dL (ref 0.00–0.40)
Total Bilirubin: 0.4 mg/dL (ref 0.0–1.2)
Total Protein: 6.4 g/dL (ref 6.0–8.5)

## 2014-07-18 LAB — VITAMIN D 25 HYDROXY (VIT D DEFICIENCY, FRACTURES): VIT D 25 HYDROXY: 129 ng/mL — AB (ref 30.0–100.0)

## 2014-08-21 DIAGNOSIS — H1045 Other chronic allergic conjunctivitis: Secondary | ICD-10-CM | POA: Diagnosis not present

## 2014-09-04 ENCOUNTER — Ambulatory Visit: Payer: Medicare Other | Admitting: Family Medicine

## 2014-09-26 ENCOUNTER — Other Ambulatory Visit: Payer: Self-pay

## 2014-10-30 ENCOUNTER — Telehealth: Payer: Self-pay | Admitting: *Deleted

## 2014-11-04 DIAGNOSIS — L570 Actinic keratosis: Secondary | ICD-10-CM | POA: Diagnosis not present

## 2014-11-04 DIAGNOSIS — D225 Melanocytic nevi of trunk: Secondary | ICD-10-CM | POA: Diagnosis not present

## 2014-11-04 DIAGNOSIS — L814 Other melanin hyperpigmentation: Secondary | ICD-10-CM | POA: Diagnosis not present

## 2014-11-04 DIAGNOSIS — L821 Other seborrheic keratosis: Secondary | ICD-10-CM | POA: Diagnosis not present

## 2014-11-05 NOTE — Telephone Encounter (Signed)
Encounter opened in error

## 2014-11-10 ENCOUNTER — Ambulatory Visit (INDEPENDENT_AMBULATORY_CARE_PROVIDER_SITE_OTHER): Payer: Self-pay | Admitting: Family Medicine

## 2014-11-10 VITALS — BP 136/75 | HR 60 | Temp 97.4°F | Ht 71.0 in | Wt 192.0 lb

## 2014-11-10 DIAGNOSIS — Z024 Encounter for examination for driving license: Secondary | ICD-10-CM

## 2014-11-10 DIAGNOSIS — Z139 Encounter for screening, unspecified: Secondary | ICD-10-CM

## 2014-11-10 LAB — POCT URINALYSIS DIPSTICK
Bilirubin, UA: NEGATIVE
Blood, UA: NEGATIVE
Glucose, UA: NEGATIVE
Ketones, UA: NEGATIVE
Nitrite, UA: NEGATIVE
Protein, UA: NEGATIVE
Spec Grav, UA: 1.01
Urobilinogen, UA: NEGATIVE
pH, UA: 7

## 2014-11-10 NOTE — Progress Notes (Signed)
   Subjective:    Patient ID: Joseph Larsen, male    DOB: 08/25/1946, 68 y.o.   MRN: [id number]  HPI Patient is here for DOT PE.  He has hx of HTN.  He has no acute complaints.  Review of Systems  Constitutional: Negative for fever.  HENT: Negative for ear pain.   Eyes: Negative for discharge.  Respiratory: Negative for cough.   Cardiovascular: Negative for chest pain.  Gastrointestinal: Negative for abdominal distention.  Endocrine: Negative for polyuria.  Genitourinary: Negative for difficulty urinating.  Musculoskeletal: Negative for gait problem and neck pain.  Skin: Negative for color change and rash.  Neurological: Negative for speech difficulty and headaches.  Psychiatric/Behavioral: Negative for agitation.       Objective:    BP 136/75 mmHg  Pulse 60  Temp(Src) 97.4 F (36.3 C) (Oral)  Ht 5\' 11"  (1.803 m)  Wt 192 lb (87.091 kg)  BMI 26.79 kg/m2 Physical Exam  Constitutional: He is oriented to person, place, and time. He appears well-developed and well-nourished.  HENT:  Head: Normocephalic and atraumatic.  Mouth/Throat: Oropharynx is clear and moist.  Eyes: Pupils are equal, round, and reactive to light.  Neck: Normal range of motion. Neck supple.  Cardiovascular: Normal rate and regular rhythm.   No murmur heard. Pulmonary/Chest: Effort normal and breath sounds normal.  Abdominal: Soft. Bowel sounds are normal. There is no tenderness.  Neurological: He is alert and oriented to person, place, and time.  Skin: Skin is warm and dry.  Psychiatric: He has a normal mood and affect.    Results for orders placed or performed in visit on 11/10/14  POCT urinalysis dipstick  Result Value Ref Range   Color, UA straw    Clarity, UA clear    Glucose, UA neg    Bilirubin, UA neg    Ketones, UA neg    Spec Grav, UA 1.010    Blood, UA neg    pH, UA 7.0    Protein, UA neg    Urobilinogen, UA negative    Nitrite, UA neg    Leukocytes, UA Trace         Assessment  & Plan:     ICD-9-CM ICD-10-CM   1. Screening V82.9 Z13.9 POCT urinalysis dipstick  2. 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  Lysbeth Penner FNP

## 2014-11-18 ENCOUNTER — Ambulatory Visit (INDEPENDENT_AMBULATORY_CARE_PROVIDER_SITE_OTHER): Payer: Medicare Other

## 2014-11-18 ENCOUNTER — Encounter: Payer: Self-pay | Admitting: Family Medicine

## 2014-11-18 ENCOUNTER — Ambulatory Visit (INDEPENDENT_AMBULATORY_CARE_PROVIDER_SITE_OTHER): Payer: Medicare Other | Admitting: Family Medicine

## 2014-11-18 VITALS — BP 133/81 | HR 53 | Temp 97.8°F | Ht 71.0 in | Wt 189.0 lb

## 2014-11-18 DIAGNOSIS — K219 Gastro-esophageal reflux disease without esophagitis: Secondary | ICD-10-CM

## 2014-11-18 DIAGNOSIS — I10 Essential (primary) hypertension: Secondary | ICD-10-CM | POA: Diagnosis not present

## 2014-11-18 DIAGNOSIS — E559 Vitamin D deficiency, unspecified: Secondary | ICD-10-CM | POA: Diagnosis not present

## 2014-11-18 DIAGNOSIS — F4310 Post-traumatic stress disorder, unspecified: Secondary | ICD-10-CM | POA: Diagnosis not present

## 2014-11-18 DIAGNOSIS — N400 Enlarged prostate without lower urinary tract symptoms: Secondary | ICD-10-CM | POA: Diagnosis not present

## 2014-11-18 NOTE — Patient Instructions (Addendum)
Medicare Annual Wellness Visit  [facility] and the medical providers at [facility] strive to bring you the best medical care.  In doing so we not only want to address your current medical conditions and concerns but also to detect new conditions early and prevent illness, disease and health-related problems.    Medicare offers a yearly Wellness Visit which allows our clinical staff to assess your need for preventative services including immunizations, lifestyle education, counseling to decrease risk of preventable diseases and screening for fall risk and other medical concerns.    This visit is provided free of charge (no copay) for all Medicare recipients. The clinical pharmacists at [facility] have begun to conduct these Wellness Visits which will also include a thorough review of all your medications.    As you primary medical provider recommend that you make an appointment for your Annual Wellness Visit if you have not done so already this year.  You may set up this appointment before you leave today or you may call back ([phone]) and schedule an appointment.  Please make sure when you call that you mention that you are scheduling your Annual Wellness Visit with the clinical pharmacist so that the appointment may be made for the proper length of time.     Continue current medications. Continue good therapeutic lifestyle changes which include good diet and exercise. Fall precautions discussed with patient. If an FOBT was given today- please return it to our front desk. If you are over 7 years old - you may need Prevnar 47 or the adult Pneumonia vaccine.  Flu Shots will be available at our office starting mid- September. Please call and schedule a FLU CLINIC APPOINTMENT.   We will call you with the results of the chest x-ray in the lab work when those results become available. Please return the FOBT Keep yearly follow-up  visit with the [facility]

## 2014-11-18 NOTE — Progress Notes (Signed)
Subjective:    Patient ID: Joseph Larsen, male    DOB: [birth year], 68 y.o.   MRN: [id number]  HPI Pt here for follow up and management of chronic medical problems. The patient is doing well with no specific complaints or problems.         Patient Active Problem List   Diagnosis Date Noted  . Hypokalemia 03/03/2014  . BPH (benign prostatic hyperplasia) 03/03/2014  . Chest pain   . Palpitations   . Dyspnea on exertion   . ETOH abuse   . PTSD (post-traumatic stress disorder) 10/02/2013  . Abnormal stress test 08/29/2012  . GERD (gastroesophageal reflux disease)   . HTN (hypertension)   . Anxiety   . Depression   . Allergic rhinitis   . Diverticular disease   . Hemorrhoids   . Hiatal hernia   . Gout   . MACROCYTIC ANEMIA 08/09/2010  . NONSPEC ELEVATION OF LEVELS OF TRANSAMINASE/LDH 08/09/2010  . NONSPECIFIC ABNORMAL FINDING IN STOOL CONTENTS 08/09/2010   Outpatient Encounter Prescriptions as of 11/18/2014  Medication Sig  . allopurinol (ZYLOPRIM) 300 MG tablet Take 300 mg by mouth daily.  Marland Kitchen amLODipine (NORVASC) 10 MG tablet Take 5 mg by mouth daily.  Marland Kitchen aspirin 81 MG tablet Take 81 mg by mouth daily.  Marland Kitchen atenolol (TENORMIN) 100 MG tablet Take 100 mg by mouth daily.  Marland Kitchen buPROPion (WELLBUTRIN SR) 200 MG 12 hr tablet Take 200 mg by mouth 2 (two) times daily.  . cholecalciferol (VITAMIN D) 1000 UNITS tablet Take 1,000 Units by mouth daily.  . diazepam (VALIUM) 10 MG tablet Take 1 tablet (10 mg total) by mouth every 8 (eight) hours as needed for anxiety.  . flunisolide (NASALIDE) 0.025 % SOLN Inhale 2 sprays into the lungs as needed.  . hydrochlorothiazide (HYDRODIURIL) 25 MG tablet Take 25 mg by mouth daily.  . Omega-3 Fatty Acids (FISH OIL) 1000 MG CAPS Take 4,000 capsules by mouth daily.  Marland Kitchen omeprazole (PRILOSEC) 20 MG capsule Take 20 mg by mouth daily.  . QUEtiapine (SEROQUEL) 50 MG tablet Take 25 mg by mouth at bedtime.  . valsartan (DIOVAN) 320 MG tablet Take 320 mg by  mouth daily.    Review of Systems  Constitutional: Negative.   HENT: Negative.   Eyes: Negative.   Respiratory: Negative.   Cardiovascular: Negative.   Gastrointestinal: Negative.   Endocrine: Negative.   Genitourinary: Negative.   Musculoskeletal: Negative.   Skin: Negative.   Allergic/Immunologic: Negative.   Neurological: Negative.   Hematological: Negative.   Psychiatric/Behavioral: Negative.        Objective:   Physical Exam  Constitutional: He is oriented to person, place, and time. He appears well-developed and well-nourished.   Alert and relaxed  HENT:  Head: Normocephalic and atraumatic.  Right Ear: External ear normal.  Left Ear: External ear normal.  Mouth/Throat: Oropharynx is clear and moist. No oropharyngeal exudate.  Minimal nasal congestion bilaterally  Eyes: Conjunctivae and EOM are normal. Pupils are equal, round, and reactive to light. Right eye exhibits no discharge. Left eye exhibits no discharge. No scleral icterus.  Neck: Normal range of motion. Neck supple. No thyromegaly present.  No thyromegaly or carotid bruits  Cardiovascular: Normal rate, regular rhythm, normal heart sounds and intact distal pulses.   No murmur heard. At 72/m  Pulmonary/Chest: Effort normal and breath sounds normal. No respiratory distress. He has no wheezes. He has no rales. He exhibits no tenderness.  No axillary adenopathy  Abdominal: Soft. Bowel sounds  are normal. He exhibits no mass. There is no tenderness. There is no rebound and no guarding.  No inguinal adenopathy  Musculoskeletal: Normal range of motion. He exhibits no edema or tenderness.  Lymphadenopathy:    He has no cervical adenopathy.  Neurological: He is alert and oriented to person, place, and time. He has normal reflexes.  Skin: Skin is warm and dry. No rash noted. No erythema. No pallor.  Psychiatric: He has a normal mood and affect. His behavior is normal. Judgment and thought content normal.  Nursing  note and vitals reviewed.  BP 133/81 mmHg  Pulse 53  Temp(Src) 97.8 F (36.6 C) (Oral)  Ht 5' 11"  (1.803 m)  Wt 189 lb (85.73 kg)  BMI 26.37 kg/m2  [facility] reading (PRIMARY) by  Dr. Brunilda Payor x-ray-  no active disease                                      Assessment & Plan:  1. BPH (benign prostatic hyperplasia) - POCT CBC  2. Gastroesophageal reflux disease, esophagitis presence not specified - POCT CBC - Hepatic function panel - NMR, lipoprofile  3. Essential hypertension - POCT CBC - BMP8+EGFR - Hepatic function panel - NMR, lipoprofile - DG Chest 2 View; Future  4. Vitamin D deficiency - POCT CBC - Vit D  25 hydroxy (rtn osteoporosis monitoring)  5. PTSD (post-traumatic stress disorder) -Continue to follow-up with the [facility]  Patient Instructions                       Medicare Annual Wellness Visit  [facility] and the medical providers at [facility] strive to bring you the best medical care.  In doing so we not only want to address your current medical conditions and concerns but also to detect new conditions early and prevent illness, disease and health-related problems.    Medicare offers a yearly Wellness Visit which allows our clinical staff to assess your need for preventative services including immunizations, lifestyle education, counseling to decrease risk of preventable diseases and screening for fall risk and other medical concerns.    This visit is provided free of charge (no copay) for all Medicare recipients. The clinical pharmacists at [facility] have begun to conduct these Wellness Visits which will also include a thorough review of all your medications.    As you primary medical provider recommend that you make an appointment for your Annual Wellness Visit if you have not done so already this year.  You may set up this appointment before you leave today or you may call back ([phone]) and schedule an  appointment.  Please make sure when you call that you mention that you are scheduling your Annual Wellness Visit with the clinical pharmacist so that the appointment may be made for the proper length of time.     Continue current medications. Continue good therapeutic lifestyle changes which include good diet and exercise. Fall precautions discussed with patient. If an FOBT was given today- please return it to our front desk. If you are over 87 years old - you may need Prevnar 21 or the adult Pneumonia vaccine.  Flu Shots will be available at our office starting mid- September. Please call and schedule a FLU CLINIC APPOINTMENT.   We will call you with the results of the chest x-ray in the lab work when those  results become available. Please return the FOBT Keep yearly follow-up visit with the [facility]   Arrie Senate MD

## 2014-11-19 ENCOUNTER — Telehealth: Payer: Self-pay

## 2014-11-19 LAB — HEPATIC FUNCTION PANEL
ALT: 25 IU/L (ref 0–44)
AST: 29 IU/L (ref 0–40)
Albumin: 4.4 g/dL (ref 3.6–4.8)
Alkaline Phosphatase: 50 IU/L (ref 39–117)
Bilirubin, Direct: 0.26 mg/dL (ref 0.00–0.40)
TOTAL PROTEIN: 6.6 g/dL (ref 6.0–8.5)
Total Bilirubin: 0.8 mg/dL (ref 0.0–1.2)

## 2014-11-19 LAB — CBC WITH DIFFERENTIAL
BASOS ABS: 0 10*3/uL (ref 0.0–0.2)
Basos: 1 %
Eos: 1 %
Eosinophils Absolute: 0.1 10*3/uL (ref 0.0–0.4)
HCT: 45.5 % (ref 37.5–51.0)
Hemoglobin: 15.5 g/dL (ref 12.6–17.7)
Immature Grans (Abs): 0 10*3/uL (ref 0.0–0.1)
Immature Granulocytes: 0 %
LYMPHS: 38 %
Lymphocytes Absolute: 2.3 10*3/uL (ref 0.7–3.1)
MCH: 33.2 pg — AB (ref 26.6–33.0)
MCHC: 34.1 g/dL (ref 31.5–35.7)
MCV: 97 fL (ref 79–97)
MONOS ABS: 0.5 10*3/uL (ref 0.1–0.9)
Monocytes: 9 %
NEUTROS ABS: 3.1 10*3/uL (ref 1.4–7.0)
NEUTROS PCT: 51 %
Platelets: 174 10*3/uL (ref 150–379)
RBC: 4.67 x10E6/uL (ref 4.14–5.80)
RDW: 13.6 % (ref 12.3–15.4)
WBC: 6 10*3/uL (ref 3.4–10.8)

## 2014-11-19 LAB — BMP8+EGFR
BUN/Creatinine Ratio: 17 (ref 10–22)
BUN: 15 mg/dL (ref 8–27)
CO2: 26 mmol/L (ref 18–29)
Calcium: 9.3 mg/dL (ref 8.6–10.2)
Chloride: 97 mmol/L (ref 97–108)
Creatinine, Ser: 0.89 mg/dL (ref 0.76–1.27)
GFR calc Af Amer: 102 mL/min/{1.73_m2} (ref >59)
GFR calc non Af Amer: 88 mL/min/{1.73_m2} (ref >59)
Glucose: 76 mg/dL (ref 65–99)
POTASSIUM: 3.7 mmol/L (ref 3.5–5.2)
SODIUM: 139 mmol/L (ref 134–144)

## 2014-11-19 LAB — NMR, LIPOPROFILE
Cholesterol: 120 mg/dL (ref 100–199)
HDL Cholesterol by NMR: 40 mg/dL (ref >39)
HDL Particle Number: 30.1 umol/L — ABNORMAL LOW (ref >=30.5)
LDL PARTICLE NUMBER: 686 nmol/L (ref <1000)
LDL SIZE: 21 nm (ref >20.5)
LDL-C: 66 mg/dL (ref 0–99)
LP-IR Score: 70 — ABNORMAL HIGH (ref <=45)
Small LDL Particle Number: 198 nmol/L (ref <=527)
Triglycerides by NMR: 72 mg/dL (ref 0–149)

## 2014-11-19 LAB — VITAMIN D 25 HYDROXY (VIT D DEFICIENCY, FRACTURES): Vit D, 25-Hydroxy: 65 ng/mL (ref 30.0–100.0)

## 2014-11-19 NOTE — Telephone Encounter (Signed)
Letter sent with results

## 2014-11-19 NOTE — Telephone Encounter (Signed)
-----   Message from Chipper Herb, MD sent at 11/19/2014  3:09 PM EST ----- The CBC has a normal white blood cell count. The hemoglobin is good and stable at 15.5. The platelet count is adequate. The blood sugar is good at 76. The creatinine, the most important kidney function test is within normal limits. The electrolytes including potassium are within normal limits. Liver function tests are within normal limits Cholesterol numbers with advanced lipid testing are excellent and at goal. The triglycerides are good. The HDL particle number or the good cholesterol remains low however but not quite as low as it has been in the past. Continue current treatment and is aggressive therapeutic lifestyle changes as possible The vitamin D level is good at 65, continue current treatment

## 2014-11-24 ENCOUNTER — Other Ambulatory Visit: Payer: Medicare Other

## 2014-11-24 DIAGNOSIS — Z1212 Encounter for screening for malignant neoplasm of rectum: Secondary | ICD-10-CM

## 2014-11-24 NOTE — Progress Notes (Signed)
Lab only 

## 2014-11-26 LAB — FECAL OCCULT BLOOD, IMMUNOCHEMICAL: FECAL OCCULT BLD: POSITIVE — AB

## 2014-11-27 ENCOUNTER — Telehealth: Payer: Self-pay | Admitting: Family Medicine

## 2014-11-27 NOTE — Telephone Encounter (Signed)
Letter sent with results

## 2014-11-27 NOTE — Telephone Encounter (Signed)
-----   Message from Chipper Herb, MD sent at 11/26/2014  5:51 PM EST ----- The FOBT was positive. The patient should wait 2 weeks and repeat the FOBT and also get a CBC.

## 2015-01-23 ENCOUNTER — Other Ambulatory Visit: Payer: Medicare Other

## 2015-01-23 DIAGNOSIS — Z1212 Encounter for screening for malignant neoplasm of rectum: Secondary | ICD-10-CM

## 2015-01-25 LAB — FECAL OCCULT BLOOD, IMMUNOCHEMICAL: Fecal Occult Bld: NEGATIVE

## 2015-02-04 ENCOUNTER — Encounter: Payer: Self-pay | Admitting: Family Medicine

## 2015-02-04 ENCOUNTER — Ambulatory Visit (INDEPENDENT_AMBULATORY_CARE_PROVIDER_SITE_OTHER): Payer: Medicare Other | Admitting: Family Medicine

## 2015-02-04 VITALS — BP 135/76 | HR 71 | Temp 97.1°F | Ht 71.0 in | Wt 191.0 lb

## 2015-02-04 DIAGNOSIS — R21 Rash and other nonspecific skin eruption: Secondary | ICD-10-CM | POA: Diagnosis not present

## 2015-02-04 DIAGNOSIS — L309 Dermatitis, unspecified: Secondary | ICD-10-CM

## 2015-02-04 MED ORDER — BETAMETHASONE SOD PHOS & ACET 6 (3-3) MG/ML IJ SUSP
6.0000 mg | Freq: Once | INTRAMUSCULAR | Status: AC
  Administered 2015-02-04: 6 mg via INTRAMUSCULAR

## 2015-02-04 MED ORDER — FLUOCINONIDE-E 0.05 % EX CREA: 1.0000 "application " | TOPICAL_CREAM | Freq: Two times a day (BID) | CUTANEOUS | Status: DC

## 2015-02-04 NOTE — Progress Notes (Signed)
Subjective:  Patient ID: Joseph Larsen, male    DOB: [birth year]  Age: 69 y.o. MRN: [id number]  CC: Rash   HPI Joseph Larsen presents for increasing erythema over several days in blotches over both forearms. Mild to moderate itching. History of excessive sun exposure. Joseph Larsen has a hematologist working with him on excessive bruising as well.   History Joseph Larsen has a past medical history of MACROCYTIC ANEMIA; NONSPEC ELEVATION OF LEVELS OF TRANSAMINASE/LDH; Nonspecific abnormal finding in stool contents; GERD (gastroesophageal reflux disease); HTN (hypertension); Anxiety; Depression; Allergic rhinitis; Diverticular disease; Hemorrhoids; Hiatal hernia; Gout; Low testosterone; PTSD (post-traumatic stress disorder); Abnormal ECG; Palpitations; Dyspnea on exertion; and ETOH abuse.   Joseph Larsen has past surgical history that includes Knee surgery (Bilateral) and Cholecystectomy (2004).   His family history includes Heart disease in his father and mother; Hypertension in his sister; Liver disease in his brother.Joseph Larsen reports that Joseph Larsen quit smoking about 32 years ago. His smoking use included Cigarettes. Joseph Larsen started smoking about 50 years ago. Joseph Larsen smoked 3.00 packs per day. Joseph Larsen does not have any smokeless tobacco history on file. Joseph Larsen reports that Joseph Larsen drinks about 4.2 oz of alcohol per week. Joseph Larsen reports that Joseph Larsen does not use illicit drugs.  Current Outpatient Prescriptions on File Prior to Visit  Medication Sig Dispense Refill  . allopurinol (ZYLOPRIM) 300 MG tablet Take 300 mg by mouth daily.    Marland Kitchen amLODipine (NORVASC) 10 MG tablet Take 5 mg by mouth daily.    Marland Kitchen aspirin 81 MG tablet Take 81 mg by mouth daily.    Marland Kitchen atenolol (TENORMIN) 100 MG tablet Take 100 mg by mouth daily.    Marland Kitchen buPROPion (WELLBUTRIN SR) 200 MG 12 hr tablet Take 200 mg by mouth 2 (two) times daily.    . cholecalciferol (VITAMIN D) 1000 UNITS tablet Take 1,000 Units by mouth daily.    . diazepam (VALIUM) 10 MG tablet Take 1 tablet (10 mg total) by mouth every 8  (eight) hours as needed for anxiety. 90 tablet 1  . flunisolide (NASALIDE) 0.025 % SOLN Inhale 2 sprays into the lungs as needed.    . hydrochlorothiazide (HYDRODIURIL) 25 MG tablet Take 25 mg by mouth daily.    . Omega-3 Fatty Acids (FISH OIL) 1000 MG CAPS Take 4,000 capsules by mouth daily.    Marland Kitchen omeprazole (PRILOSEC) 20 MG capsule Take 20 mg by mouth daily.    . QUEtiapine (SEROQUEL) 50 MG tablet Take 25 mg by mouth at bedtime.    . valsartan (DIOVAN) 320 MG tablet Take 320 mg by mouth daily.     No current facility-administered medications on file prior to visit.    ROS Review of Systems  Constitutional: Negative for fever, chills, activity change and appetite change.  HENT: Negative for ear discharge, ear pain, hearing loss, sneezing and trouble swallowing.   Respiratory: Negative for chest tightness and shortness of breath.   Cardiovascular: Negative for chest pain and palpitations.  Gastrointestinal: Negative for abdominal pain.  Skin: Negative for rash.    Objective:  BP 135/76 mmHg  Pulse 71  Temp(Src) 97.1 F (36.2 C) (Oral)  Ht 5\' 11"  (1.803 m)  Wt 191 lb (86.637 kg)  BMI 26.65 kg/m2  BP Readings from Last 3 Encounters:  02/04/15 135/76  11/18/14 133/81  11/10/14 136/75    Wt Readings from Last 3 Encounters:  02/04/15 191 lb (86.637 kg)  11/18/14 189 lb (85.73 kg)  11/10/14 192 lb (87.091 kg)  Physical Exam  Constitutional: Joseph Larsen is oriented to person, place, and time. Joseph Larsen appears well-developed and well-nourished. No distress.  HENT:  Head: Normocephalic and atraumatic.  Eyes: Conjunctivae and EOM are normal. Pupils are equal, round, and reactive to light.  Neck: Normal range of motion. Neck supple.  Cardiovascular: Normal rate, regular rhythm and normal heart sounds.   No murmur heard. Pulmonary/Chest: Effort normal and breath sounds normal.  Abdominal: Soft. Bowel sounds are normal.  Neurological: Joseph Larsen is alert and oriented to person, place, and time. Joseph Larsen  has normal reflexes.  Skin: Skin is warm and dry. Rash (Forearms have 6-8 mm patches of erythema with slight scale also some scattered bruising.) noted.  Psychiatric: Joseph Larsen has a normal mood and affect. His behavior is normal. Judgment and thought content normal.    No results found for: HGBA1C  Lab Results  Component Value Date   WBC 6.0 11/18/2014   HGB 15.5 11/18/2014   HCT 45.5 11/18/2014   PLT 174 11/18/2014   GLUCOSE 76 11/18/2014   CHOL 120 11/18/2014   TRIG 72 11/18/2014   HDL 40 11/18/2014   LDLCALC 54 07/17/2014   ALT 25 11/18/2014   AST 29 11/18/2014   NA 139 11/18/2014   K 3.7 11/18/2014   CL 97 11/18/2014   CREATININE 0.89 11/18/2014   BUN 15 11/18/2014   CO2 26 11/18/2014   TSH 1.50 12/24/2013   PSA 0.6 03/03/2014   INR 1.1 ratio* 08/09/2010    No results found.  Assessment & Plan:   Joseph Larsen was seen today for rash.  Diagnoses and all orders for this visit:  Eczema  Rash Orders: -     betamethasone acetate-betamethasone sodium phosphate (CELESTONE) injection 6 mg; Inject 1 mL (6 mg total) into the muscle once.  Other orders -     Discontinue: fluocinonide-emollient (LIDEX-E) 0.05 % cream; Apply 1 application topically 2 (two) times daily.   I am having Mr. Hanover maintain his flunisolide, aspirin, amLODipine, hydrochlorothiazide, QUEtiapine, valsartan, atenolol, allopurinol, buPROPion, omeprazole, diazepam, cholecalciferol, and Fish Oil. We administered betamethasone acetate-betamethasone sodium phosphate.  Meds ordered this encounter  Medications  . betamethasone acetate-betamethasone sodium phosphate (CELESTONE) injection 6 mg    Sig:   . DISCONTD: fluocinonide-emollient (LIDEX-E) 0.05 % cream    Sig: Apply 1 application topically 2 (two) times daily.    Dispense:  60 g    Refill:  5    Follow-up: No Follow-up on file.  Claretta Fraise, M.D.

## 2015-02-05 ENCOUNTER — Other Ambulatory Visit: Payer: Self-pay | Admitting: *Deleted

## 2015-02-05 MED ORDER — FLUOCINONIDE-E 0.05 % EX CREA: 1.0000 "application " | TOPICAL_CREAM | Freq: Two times a day (BID) | CUTANEOUS | Status: DC

## 2015-02-05 NOTE — Progress Notes (Signed)
Pt needed printed RX to send to the [facility]

## 2015-02-17 ENCOUNTER — Ambulatory Visit (INDEPENDENT_AMBULATORY_CARE_PROVIDER_SITE_OTHER): Payer: Medicare Other | Admitting: Nurse Practitioner

## 2015-02-17 ENCOUNTER — Encounter: Payer: Self-pay | Admitting: Nurse Practitioner

## 2015-02-17 VITALS — BP 120/72 | HR 57 | Temp 97.1°F | Ht 71.0 in | Wt 191.0 lb

## 2015-02-17 DIAGNOSIS — J069 Acute upper respiratory infection, unspecified: Secondary | ICD-10-CM

## 2015-02-17 NOTE — Patient Instructions (Signed)
Eczema Eczema, also called atopic dermatitis, is a skin disorder that causes inflammation of the skin. It causes a red rash and dry, scaly skin. The skin becomes very itchy. Eczema is generally worse during the cooler winter months and often improves with the warmth of summer. Eczema usually starts showing signs in infancy. Some children outgrow eczema, but it may last through adulthood.  CAUSES  The exact cause of eczema is not known, but it appears to run in families. People with eczema often have a family history of eczema, allergies, asthma, or hay fever. Eczema is not contagious. Flare-ups of the condition may be caused by:   Contact with something you are sensitive or allergic to.   Stress. SIGNS AND SYMPTOMS  Dry, scaly skin.   Red, itchy rash.   Itchiness. This may occur before the skin rash and may be very intense.  DIAGNOSIS  The diagnosis of eczema is usually made based on symptoms and medical history. TREATMENT  Eczema cannot be cured, but symptoms usually can be controlled with treatment and other strategies. A treatment plan might include:  Controlling the itching and scratching.   Use over-the-counter antihistamines as directed for itching. This is especially useful at night when the itching tends to be worse.   Use over-the-counter steroid creams as directed for itching.   Avoid scratching. Scratching makes the rash and itching worse. It may also result in a skin infection (impetigo) due to a break in the skin caused by scratching.   Keeping the skin well moisturized with creams every day. This will seal in moisture and help prevent dryness. Lotions that contain alcohol and water should be avoided because they can dry the skin.   Limiting exposure to things that you are sensitive or allergic to (allergens).   Recognizing situations that cause stress.   Developing a plan to manage stress.  HOME CARE INSTRUCTIONS   Only take over-the-counter or  prescription medicines as directed by your health care provider.   Do not use anything on the skin without checking with your health care provider.   Keep baths or showers short (5 minutes) in warm (not hot) water. Use mild cleansers for bathing. These should be unscented. You may add nonperfumed bath oil to the bath water. It is best to avoid soap and bubble bath.   Immediately after a bath or shower, when the skin is still damp, apply a moisturizing ointment to the entire body. This ointment should be a petroleum ointment. This will seal in moisture and help prevent dryness. The thicker the ointment, the better. These should be unscented.   Keep fingernails cut short. Children with eczema may need to wear soft gloves or mittens at night after applying an ointment.   Dress in clothes made of cotton or cotton blends. Dress lightly, because heat increases itching.   A child with eczema should stay away from anyone with fever blisters or cold sores. The virus that causes fever blisters (herpes simplex) can cause a serious skin infection in children with eczema. SEEK MEDICAL CARE IF:   Your itching interferes with sleep.   Your rash gets worse or is not better within 1 week after starting treatment.   You see pus or soft yellow scabs in the rash area.   You have a fever.   You have a rash flare-up after contact with someone who has fever blisters.  Document Released: 11/25/2000 Document Revised: 09/18/2013 Document Reviewed: 07/01/2013 ExitCare Patient Information 2015 [employer]. This information   is not intended to replace advice given to you by your health care provider. Make sure you discuss any questions you have with your health care provider.  

## 2015-02-17 NOTE — Progress Notes (Signed)
  Subjective:     Joseph Larsen is a 69 y.o. male who presents for evaluation of sore throat. Associated symptoms include chest congestion, headache, nasal blockage, post nasal drip, sinus and nasal congestion, sore throat and productive cough in the morning. . Onset of symptoms was 3 days ago, and have been gradually worsening since that time. He is drinking moderate amounts of fluids. He has had a recent close exposure to someone with proven streptococcal pharyngitis.  The following portions of the patient's history were reviewed and updated as appropriate: allergies, current medications, past family history, past medical history, past social history, past surgical history and problem list.  Review of Systems Pertinent items are noted in HPI.    Objective:    BP 120/72 mmHg  Pulse 57  Temp(Src) 97.1 F (36.2 C) (Oral)  Ht 5\' 11"  (1.803 m)  Wt 191 lb (86.637 kg)  BMI 26.65 kg/m2 General appearance: alert Head: Normocephalic, without obvious abnormality, atraumatic Ears: normal TM's and external ear canals both ears Nose: Nares normal. Septum midline. Mucosa normal. No drainage or sinus tenderness. Throat: lips, mucosa, and tongue normal; teeth and gums normal Lungs: clear to auscultation bilaterally Chest wall: no tenderness      Assessment:   Viral URI   Plan:     1. Take meds as prescribed 2. Use a cool mist humidifier especially during the winter months and when heat has been humid. 3. Use saline nose sprays frequently 4. Saline irrigations of the nose can be very helpful if done frequently.  * 4X daily for 1 week*  * Use of a nettie pot can be helpful with this. Follow directions with this* 5. Drink plenty of fluids 6. Keep thermostat turn down low 7.For any cough or congestion  Use plain Mucinex- regular strength or max strength is fine   * Children- consult with Pharmacist for dosing 8. For fever or aces or pains- take tylenol or ibuprofen appropriate for age and  weight.  * for fevers greater than 101 orally you may alternate ibuprofen and tylenol every  3 hours.   Mary-Margaret Hassell Done, FNP

## 2015-02-17 NOTE — Patient Instructions (Signed)

## 2015-03-07 ENCOUNTER — Other Ambulatory Visit: Payer: Self-pay | Admitting: Family Medicine

## 2015-03-09 NOTE — Telephone Encounter (Signed)
Last seen 02/17/15  This med not on EPIC list

## 2015-03-25 ENCOUNTER — Ambulatory Visit (INDEPENDENT_AMBULATORY_CARE_PROVIDER_SITE_OTHER): Payer: Medicare Other | Admitting: Family Medicine

## 2015-03-25 ENCOUNTER — Encounter: Payer: Self-pay | Admitting: Family Medicine

## 2015-03-25 VITALS — BP 118/68 | HR 57 | Temp 97.3°F | Ht 71.0 in | Wt 190.0 lb

## 2015-03-25 DIAGNOSIS — I10 Essential (primary) hypertension: Secondary | ICD-10-CM | POA: Diagnosis not present

## 2015-03-25 DIAGNOSIS — F4310 Post-traumatic stress disorder, unspecified: Secondary | ICD-10-CM

## 2015-03-25 DIAGNOSIS — E559 Vitamin D deficiency, unspecified: Secondary | ICD-10-CM

## 2015-03-25 DIAGNOSIS — E785 Hyperlipidemia, unspecified: Secondary | ICD-10-CM | POA: Diagnosis not present

## 2015-03-25 DIAGNOSIS — N400 Enlarged prostate without lower urinary tract symptoms: Secondary | ICD-10-CM | POA: Diagnosis not present

## 2015-03-25 DIAGNOSIS — K219 Gastro-esophageal reflux disease without esophagitis: Secondary | ICD-10-CM | POA: Diagnosis not present

## 2015-03-25 LAB — POCT UA - MICROSCOPIC ONLY
Casts, Ur, LPF, POC: NEGATIVE
Crystals, Ur, HPF, POC: NEGATIVE
YEAST UA: NEGATIVE

## 2015-03-25 LAB — POCT CBC
Granulocyte percent: 53.7 %G (ref 37–80)
HCT, POC: 48.7 % (ref 43.5–53.7)
Hemoglobin: 15.2 g/dL (ref 14.1–18.1)
LYMPH, POC: 2.4 (ref 0.6–3.4)
MCH: 31.6 pg — AB (ref 27–31.2)
MCHC: 31.2 g/dL — AB (ref 31.8–35.4)
MCV: 101.2 fL — AB (ref 80–97)
MPV: 7.4 fL (ref 0–99.8)
PLATELET COUNT, POC: 167 10*3/uL (ref 142–424)
POC Granulocyte: 3.2 (ref 2–6.9)
POC LYMPH PERCENT: 40.9 %L (ref 10–50)
RBC: 4.81 M/uL (ref 4.69–6.13)
RDW, POC: 13.4 %
WBC: 5.9 10*3/uL (ref 4.6–10.2)

## 2015-03-25 LAB — POCT URINALYSIS DIPSTICK
Glucose, UA: NEGATIVE
Ketones, UA: NEGATIVE
Nitrite, UA: NEGATIVE
Protein, UA: NEGATIVE
RBC UA: NEGATIVE
SPEC GRAV UA: 1.015
Urobilinogen, UA: NEGATIVE
pH, UA: 6.5

## 2015-03-25 NOTE — Progress Notes (Signed)
Subjective:    Patient ID: Joseph Larsen, male    DOB: 05-08-1946, 69 y.o.   MRN: [id number]  HPI Pt here for follow up and management of chronic medical problems which includes PTSD, Hypertension, and BPH. He is taking medications regularly. The patient has been doing well and has had no complaints of chest pain shortness of breath or GI related symptoms other than occasional heartburn. He is passing his water without problems. The family history is positive for heart disease and hypertension. He recently in the past 18 months had a thorough cardiac evaluation and everything was good with that.       Patient Active Problem List   Diagnosis Date Noted  . Hypokalemia 03/03/2014  . BPH (benign prostatic hyperplasia) 03/03/2014  . Chest pain   . Palpitations   . Dyspnea on exertion   . ETOH abuse   . PTSD (post-traumatic stress disorder) 10/02/2013  . Abnormal stress test 08/29/2012  . GERD (gastroesophageal reflux disease)   . HTN (hypertension)   . Anxiety   . Depression   . Allergic rhinitis   . Diverticular disease   . Hemorrhoids   . Hiatal hernia   . Gout   . MACROCYTIC ANEMIA 08/09/2010  . NONSPEC ELEVATION OF LEVELS OF TRANSAMINASE/LDH 08/09/2010  . NONSPECIFIC ABNORMAL FINDING IN STOOL CONTENTS 08/09/2010   Outpatient Encounter Prescriptions as of 03/25/2015  Medication Sig  . allopurinol (ZYLOPRIM) 300 MG tablet Take 300 mg by mouth daily.  Marland Kitchen amLODipine (NORVASC) 10 MG tablet Take 5 mg by mouth daily.  Marland Kitchen aspirin 81 MG tablet Take 81 mg by mouth daily.  Marland Kitchen atenolol (TENORMIN) 100 MG tablet Take 100 mg by mouth daily.  Marland Kitchen buPROPion (WELLBUTRIN SR) 200 MG 12 hr tablet Take 200 mg by mouth 2 (two) times daily.  . cholecalciferol (VITAMIN D) 1000 UNITS tablet Take 1,000 Units by mouth daily.  . diazepam (VALIUM) 10 MG tablet Take 1 tablet (10 mg total) by mouth every 8 (eight) hours as needed for anxiety.  . flunisolide (NASALIDE) 0.025 % SOLN Inhale 2 sprays into the  lungs as needed.  . fluocinonide-emollient (LIDEX-E) 0.05 % cream Apply 1 application topically 2 (two) times daily.  . hydrochlorothiazide (HYDRODIURIL) 25 MG tablet Take 25 mg by mouth daily.  Marland Kitchen nystatin (MYCOSTATIN) 100000 UNIT/ML suspension TAKE FIVE MLS BY MOUTH 4 TIMES DAILY. TAKE BEFORE MEALS AND AT BEDTIME. SWISH AND SWALLOW  . Omega-3 Fatty Acids (FISH OIL) 1000 MG CAPS Take 4 capsules by mouth daily.   Marland Kitchen omeprazole (PRILOSEC) 20 MG capsule Take 20 mg by mouth daily.  . QUEtiapine (SEROQUEL) 50 MG tablet Take 25 mg by mouth at bedtime.  . valsartan (DIOVAN) 320 MG tablet Take 320 mg by mouth daily.    Review of Systems  Constitutional: Negative.   HENT: Negative.   Eyes: Negative.   Respiratory: Negative.   Cardiovascular: Negative.   Gastrointestinal: Negative.   Endocrine: Negative.   Genitourinary: Negative.   Musculoskeletal: Negative.   Skin: Negative.   Allergic/Immunologic: Negative.   Neurological: Negative.   Hematological: Negative.   Psychiatric/Behavioral: Negative.        Objective:   Physical Exam  Constitutional: He is oriented to person, place, and time. He appears well-developed and well-nourished. No distress.  HENT:  Head: Normocephalic and atraumatic.  Right Ear: External ear normal.  Left Ear: External ear normal.  Mouth/Throat: Oropharynx is clear and moist. No oropharyngeal exudate.  Some nasal congestion bilaterally  Eyes:  Conjunctivae and EOM are normal. Pupils are equal, round, and reactive to light. Right eye exhibits no discharge. Left eye exhibits no discharge. No scleral icterus.  Neck: Normal range of motion. Neck supple. No thyromegaly present.  No bruits or thyromegaly  Cardiovascular: Normal rate, regular rhythm, normal heart sounds and intact distal pulses.  Exam reveals no gallop and no friction rub.   No murmur heard. At 72/m  Pulmonary/Chest: Effort normal and breath sounds normal. No respiratory distress. He has no wheezes. He  has no rales. He exhibits no tenderness.  Abdominal: Soft. Bowel sounds are normal. He exhibits no mass. There is no tenderness. There is no rebound and no guarding.  Genitourinary: Rectum normal and penis normal.  The prostate is enlarged but smooth bilaterally without any nodules. The rectal area is clear of gross externally and internally. There are no inguinal hernias or inguinal nodes and the external genitalia were normal.  Musculoskeletal: Normal range of motion. He exhibits no edema or tenderness.  Lymphadenopathy:    He has no cervical adenopathy.  Neurological: He is alert and oriented to person, place, and time. He has normal reflexes. No cranial nerve deficit.  Skin: Skin is warm and dry. No rash noted. No erythema. No pallor.  Psychiatric: He has a normal mood and affect. His behavior is normal. Judgment and thought content normal.  Nursing note and vitals reviewed.  BP 118/68 mmHg  Pulse 57  Temp(Src) 97.3 F (36.3 C) (Oral)  Ht 5' 11"  (1.803 m)  Wt 190 lb (86.183 kg)  BMI 26.51 kg/m2        Assessment & Plan:  1. BPH (benign prostatic hyperplasia) -The prostate is enlarged but smooth and the patient is having no symptoms with his BPH. - POCT CBC - POCT UA - Microscopic Only - POCT urinalysis dipstick - PSA, total and free  2. Gastroesophageal reflux disease, esophagitis presence not specified -This is under control most of the time unless he eats really spicy foods or forgets to take his medication. - POCT CBC - Hepatic function panel - NMR, lipoprofile  3. Essential hypertension -Blood pressure is good today and he should continue with current treatment - POCT CBC - BMP8+EGFR - Hepatic function panel - NMR, lipoprofile  4. Vitamin D deficiency -The vitamin D has been good in the past and we will monitor this today with lab work and make any changes when the lab work is returned. - POCT CBC - Vit D  25 hydroxy (rtn osteoporosis monitoring)  5. PTSD  (post-traumatic stress disorder) -He is in good spirits with this today and smiling and has no complaints with this disorder. - POCT CBC  6. Hyperlipidemia -He will continue with his fish oiland with as aggressive therapeutic lifestyle changes which include diet and exercise  Patient Instructions                       Medicare Annual Wellness Visit  [facility] and the medical providers at [facility] strive to bring you the best medical care.  In doing so we not only want to address your current medical conditions and concerns but also to detect new conditions early and prevent illness, disease and health-related problems.    Medicare offers a yearly Wellness Visit which allows our clinical staff to assess your need for preventative services including immunizations, lifestyle education, counseling to decrease risk of preventable diseases and screening for fall risk and other medical concerns.  This visit is provided free of charge (no copay) for all Medicare recipients. The clinical pharmacists at [facility] have begun to conduct these Wellness Visits which will also include a thorough review of all your medications.    As you primary medical provider recommend that you make an appointment for your Annual Wellness Visit if you have not done so already this year.  You may set up this appointment before you leave today or you may call back ([phone]) and schedule an appointment.  Please make sure when you call that you mention that you are scheduling your Annual Wellness Visit with the clinical pharmacist so that the appointment may be made for the proper length of time.     Continue current medications. Continue good therapeutic lifestyle changes which include good diet and exercise. Fall precautions discussed with patient. If an FOBT was given today- please return it to our front desk. If you are over 29 years old - you may need Prevnar 28 or  the adult Pneumonia vaccine.  Flu Shots are still available at our office. If you still haven't had one please call to set up a nurse visit to get one.   After your visit with [place] today you will receive a survey in the mail or online from [employer] regarding your care with [place]. Please take a moment to fill this out. Your feedback is very important to [place] as you can help [place] better understand your patient needs as well as improve your experience and satisfaction. WE CARE ABOUT YOU!!!   Continue your activity level with exercise and outdoor activities Continue aggressive diet habits Always drink plenty of fluids Watch sodium intake   Arrie Senate MD

## 2015-03-25 NOTE — Patient Instructions (Addendum)
Medicare Annual Wellness Visit  [facility] and the medical providers at [facility] strive to bring you the best medical care.  In doing so we not only want to address your current medical conditions and concerns but also to detect new conditions early and prevent illness, disease and health-related problems.    Medicare offers a yearly Wellness Visit which allows our clinical staff to assess your need for preventative services including immunizations, lifestyle education, counseling to decrease risk of preventable diseases and screening for fall risk and other medical concerns.    This visit is provided free of charge (no copay) for all Medicare recipients. The clinical pharmacists at [facility] have begun to conduct these Wellness Visits which will also include a thorough review of all your medications.    As you primary medical provider recommend that you make an appointment for your Annual Wellness Visit if you have not done so already this year.  You may set up this appointment before you leave today or you may call back ([phone]) and schedule an appointment.  Please make sure when you call that you mention that you are scheduling your Annual Wellness Visit with the clinical pharmacist so that the appointment may be made for the proper length of time.     Continue current medications. Continue good therapeutic lifestyle changes which include good diet and exercise. Fall precautions discussed with patient. If an FOBT was given today- please return it to our front desk. If you are over 50 years old - you may need Prevnar 58 or the adult Pneumonia vaccine.  Flu Shots are still available at our office. If you still haven't had one please call to set up a nurse visit to get one.   After your visit with [place] today you will receive a survey in the mail or online from [employer] regarding your care with [place]. Please take a moment to  fill this out. Your feedback is very important to [place] as you can help [place] better understand your patient needs as well as improve your experience and satisfaction. WE CARE ABOUT YOU!!!   Continue your activity level with exercise and outdoor activities Continue aggressive diet habits Always drink plenty of fluids Watch sodium intake

## 2015-03-26 LAB — NMR, LIPOPROFILE
CHOLESTEROL: 126 mg/dL (ref 100–199)
HDL Cholesterol by NMR: 38 mg/dL — ABNORMAL LOW (ref >39)
HDL Particle Number: 30.6 umol/L (ref >=30.5)
LDL Particle Number: 910 nmol/L (ref <1000)
LDL Size: 21 nm (ref >20.5)
LDL-C: 72 mg/dL (ref 0–99)
LP-IR SCORE: 82 — AB (ref <=45)
SMALL LDL PARTICLE NUMBER: 231 nmol/L (ref <=527)
TRIGLYCERIDES BY NMR: 82 mg/dL (ref 0–149)

## 2015-03-26 LAB — HEPATIC FUNCTION PANEL
ALT: 29 IU/L (ref 0–44)
AST: 34 IU/L (ref 0–40)
Albumin: 4.3 g/dL (ref 3.6–4.8)
Alkaline Phosphatase: 57 IU/L (ref 39–117)
Bilirubin Total: 0.5 mg/dL (ref 0.0–1.2)
Bilirubin, Direct: 0.2 mg/dL (ref 0.00–0.40)
Total Protein: 6.5 g/dL (ref 6.0–8.5)

## 2015-03-26 LAB — BMP8+EGFR
BUN/Creatinine Ratio: 14 (ref 10–22)
BUN: 12 mg/dL (ref 8–27)
CO2: 27 mmol/L (ref 18–29)
Calcium: 9.3 mg/dL (ref 8.6–10.2)
Chloride: 97 mmol/L (ref 97–108)
Creatinine, Ser: 0.84 mg/dL (ref 0.76–1.27)
GFR calc Af Amer: 105 mL/min/{1.73_m2} (ref >59)
GFR calc non Af Amer: 91 mL/min/{1.73_m2} (ref >59)
Glucose: 81 mg/dL (ref 65–99)
Potassium: 3.8 mmol/L (ref 3.5–5.2)
Sodium: 138 mmol/L (ref 134–144)

## 2015-03-26 LAB — PSA, TOTAL AND FREE
PSA, Free Pct: 32.5 %
PSA, Free: 0.26 ng/mL
PSA: 0.8 ng/mL (ref 0.0–4.0)

## 2015-03-26 LAB — VITAMIN D 25 HYDROXY (VIT D DEFICIENCY, FRACTURES): Vit D, 25-Hydroxy: 69.3 ng/mL (ref 30.0–100.0)

## 2015-04-20 ENCOUNTER — Telehealth: Payer: Self-pay | Admitting: Family Medicine

## 2015-04-20 NOTE — Telephone Encounter (Signed)
Detailed message left that patient does need an appointment to be seen.

## 2015-04-24 ENCOUNTER — Ambulatory Visit (INDEPENDENT_AMBULATORY_CARE_PROVIDER_SITE_OTHER): Payer: Medicare Other | Admitting: Family Medicine

## 2015-04-24 ENCOUNTER — Encounter: Payer: Self-pay | Admitting: Family Medicine

## 2015-04-24 VITALS — BP 136/74 | HR 59 | Temp 97.6°F | Ht 71.0 in | Wt 189.0 lb

## 2015-04-24 DIAGNOSIS — N3001 Acute cystitis with hematuria: Secondary | ICD-10-CM

## 2015-04-24 DIAGNOSIS — N419 Inflammatory disease of prostate, unspecified: Secondary | ICD-10-CM

## 2015-04-24 LAB — POCT UA - MICROSCOPIC ONLY
Bacteria, U Microscopic: NEGATIVE
Casts, Ur, LPF, POC: NEGATIVE
Crystals, Ur, HPF, POC: NEGATIVE
EPITHELIAL CELLS, URINE PER MICROSCOPY: NEGATIVE
RBC, urine, microscopic: NEGATIVE
Yeast, UA: NEGATIVE

## 2015-04-24 LAB — POCT URINALYSIS DIPSTICK
Glucose, UA: NEGATIVE
Ketones, UA: NEGATIVE
Leukocytes, UA: NEGATIVE
Nitrite, UA: NEGATIVE
PH UA: 6.5
RBC UA: NEGATIVE
SPEC GRAV UA: 1.015
Urobilinogen, UA: NEGATIVE

## 2015-04-24 LAB — POCT WET PREP (WET MOUNT): KOH Wet Prep POC: POSITIVE

## 2015-04-24 NOTE — Addendum Note (Signed)
Addended by: Zannie Cove on: 04/24/2015 03:31 PM   Modules accepted: Orders

## 2015-04-24 NOTE — Patient Instructions (Addendum)
The patient should continue to drink plenty of fluids If he develops any further symptoms regarding urinary tract infection he should get back in touch with [place]    Tick Bite Information Ticks are insects that attach themselves to the skin and draw blood for food. There are various types of ticks. Common types include wood ticks and deer ticks. Most ticks live in shrubs and grassy areas. Ticks can climb onto your body when you make contact with leaves or grass where the tick is waiting. The most common places on the body for ticks to attach themselves are the scalp, neck, armpits, waist, and groin. Most tick bites are harmless, but sometimes ticks carry germs that cause diseases. These germs can be spread to a person during the tick's feeding process. The chance of a disease spreading through a tick bite depends on:   The type of tick.  Time of year.   How long the tick is attached.   Geographic location.  HOW CAN YOU PREVENT TICK BITES? Take these steps to help prevent tick bites when you are outdoors:  Wear protective clothing. Long sleeves and long pants are best.   Wear white clothes so you can see ticks more easily.  Tuck your pant legs into your socks.   If walking on a trail, stay in the middle of the trail to avoid brushing against bushes.  Avoid walking through areas with long grass.  Put insect repellent on all exposed skin and along boot tops, pant legs, and sleeve cuffs.   Check clothing, hair, and skin repeatedly and before going inside.   Brush off any ticks that are not attached.  Take a shower or bath as soon as possible after being outdoors.  WHAT IS THE PROPER WAY TO REMOVE A TICK? Ticks should be removed as soon as possible to help prevent diseases caused by tick bites. 1. If latex gloves are available, put them on before trying to remove a tick.  2. Using fine-point tweezers, grasp the tick as close to the skin as possible. You may also use curved  forceps or a tick removal tool. Grasp the tick as close to its head as possible. Avoid grasping the tick on its body. 3. Pull gently with steady upward pressure until the tick lets go. Do not twist the tick or jerk it suddenly. This may break off the tick's head or mouth parts. 4. Do not squeeze or crush the tick's body. This could force disease-carrying fluids from the tick into your body.  5. After the tick is removed, wash the bite area and your hands with soap and water or other disinfectant such as alcohol. 6. Apply a small amount of antiseptic cream or ointment to the bite site.  7. Wash and disinfect any instruments that were used.  Do not try to remove a tick by applying a hot match, petroleum jelly, or fingernail polish to the tick. These methods do not work and may increase the chances of disease being spread from the tick bite.  WHEN SHOULD YOU SEEK MEDICAL CARE? Contact your health care provider if you are unable to remove a tick from your skin or if a part of the tick breaks off and is stuck in the skin.  After a tick bite, you need to be aware of signs and symptoms that could be related to diseases spread by ticks. Contact your health care provider if you develop any of the following in the days or weeks after the  tick bite:  Unexplained fever.  Rash. A circular rash that appears days or weeks after the tick bite may indicate the possibility of Lyme disease. The rash may resemble a target with a bull's-eye and may occur at a different part of your body than the tick bite.  Redness and swelling in the area of the tick bite.   Tender, swollen lymph glands.   Diarrhea.   Weight loss.   Cough.   Fatigue.   Muscle, joint, or bone pain.   Abdominal pain.   Headache.   Lethargy or a change in your level of consciousness.  Difficulty walking or moving your legs.   Numbness in the legs.   Paralysis.  Shortness of breath.   Confusion.   Repeated  vomiting.  Document Released: 11/25/2000 Document Revised: 09/18/2013 Document Reviewed: 05/08/2013 [facility] Patient Information 2015 [place], [place]. This information is not intended to replace advice given to you by your health care provider. Make sure you discuss any questions you have with your health care provider.

## 2015-04-24 NOTE — Progress Notes (Signed)
Subjective:    Patient ID: Joseph Larsen, male    DOB: 02-13-1946, 69 y.o.   MRN: [id number]  HPI Patient here today for 4 week follow up of prostatitis. He is feeling well. On the urinalysis from 1 month ago the patient had 30-50 WBC and he said he was completely asymptomatic with this. He was treated anyway with doxycycline he returns today for repeat urine and prostate exam. Also the patient says today he is removed about 3 or 4 dear ticks while he was taking the doxycycline.       Patient Active Problem List   Diagnosis Date Noted  . Hypokalemia 03/03/2014  . BPH (benign prostatic hyperplasia) 03/03/2014  . Chest pain   . Palpitations   . Dyspnea on exertion   . ETOH abuse   . PTSD (post-traumatic stress disorder) 10/02/2013  . Abnormal stress test 08/29/2012  . GERD (gastroesophageal reflux disease)   . HTN (hypertension)   . Anxiety   . Depression   . Allergic rhinitis   . Diverticular disease   . Hemorrhoids   . Hiatal hernia   . Gout   . MACROCYTIC ANEMIA 08/09/2010  . NONSPEC ELEVATION OF LEVELS OF TRANSAMINASE/LDH 08/09/2010  . NONSPECIFIC ABNORMAL FINDING IN STOOL CONTENTS 08/09/2010   Outpatient Encounter Prescriptions as of 04/24/2015  Medication Sig  . allopurinol (ZYLOPRIM) 300 MG tablet Take 300 mg by mouth daily.  Marland Kitchen amLODipine (NORVASC) 10 MG tablet Take 5 mg by mouth daily.  Marland Kitchen aspirin 81 MG tablet Take 81 mg by mouth daily.  Marland Kitchen atenolol (TENORMIN) 100 MG tablet Take 100 mg by mouth daily.  Marland Kitchen buPROPion (WELLBUTRIN SR) 200 MG 12 hr tablet Take 200 mg by mouth 2 (two) times daily.  . cholecalciferol (VITAMIN D) 1000 UNITS tablet Take 1,000 Units by mouth daily.  . diazepam (VALIUM) 10 MG tablet Take 1 tablet (10 mg total) by mouth every 8 (eight) hours as needed for anxiety.  . flunisolide (NASALIDE) 0.025 % SOLN Inhale 2 sprays into the lungs as needed.  . fluocinonide-emollient (LIDEX-E) 0.05 % cream Apply 1 application topically 2 (two) times daily.  .  hydrochlorothiazide (HYDRODIURIL) 25 MG tablet Take 25 mg by mouth daily.  Marland Kitchen nystatin (MYCOSTATIN) 100000 UNIT/ML suspension TAKE FIVE MLS BY MOUTH 4 TIMES DAILY. TAKE BEFORE MEALS AND AT BEDTIME. SWISH AND SWALLOW  . Omega-3 Fatty Acids (FISH OIL) 1000 MG CAPS Take 4 capsules by mouth daily.   Marland Kitchen omeprazole (PRILOSEC) 20 MG capsule Take 20 mg by mouth daily.  . QUEtiapine (SEROQUEL) 50 MG tablet Take 25 mg by mouth at bedtime.  . valsartan (DIOVAN) 320 MG tablet Take 320 mg by mouth daily.   No facility-administered encounter medications on file as of 04/24/2015.     Review of Systems  Constitutional: Negative.   HENT: Negative.   Eyes: Negative.   Respiratory: Negative.   Cardiovascular: Negative.   Gastrointestinal: Negative.   Endocrine: Negative.   Genitourinary: Negative.   Musculoskeletal: Negative.   Skin: Negative.   Allergic/Immunologic: Negative.   Neurological: Negative.   Hematological: Negative.   Psychiatric/Behavioral: Negative.        Objective:   Physical Exam  Constitutional: He is oriented to person, place, and time. He appears well-developed and well-nourished.  Abdominal: Soft. There is no tenderness. There is no rebound and no guarding.  No suprapubic tenderness  Neurological: He is alert and oriented to person, place, and time.  Skin: Skin is warm and dry. No  rash noted.  The patient has multiple tick bite sites that have been removed in the past week while he was taking the doxycycline.  Psychiatric: He has a normal mood and affect. His behavior is normal. Judgment and thought content normal.  Nursing note and vitals reviewed.  BP 136/74 mmHg  Pulse 59  Temp(Src) 97.6 F (36.4 C) (Oral)  Ht 5\' 11"  (1.803 m)  Wt 189 lb (85.73 kg)  BMI 26.37 kg/m2 Results for orders placed or performed in visit on 04/24/15  POCT UA - Microscopic Only  Result Value Ref Range   WBC, Ur, HPF, POC occ    RBC, urine, microscopic neg    Bacteria, U Microscopic neg     Mucus, UA occ    Epithelial cells, urine per micros neg    Crystals, Ur, HPF, POC neg    Casts, Ur, LPF, POC neg    Yeast, UA neg   POCT urinalysis dipstick  Result Value Ref Range   Color, UA gold    Clarity, UA clear    Glucose, UA neg    Bilirubin, UA small    Ketones, UA neg    Spec Grav, UA 1.015    Blood, UA neg    pH, UA 6.5    Protein, UA trace    Urobilinogen, UA negative    Nitrite, UA neg    Leukocytes, UA Negative           Assessment & Plan:  1. Prostatitis, unspecified prostatitis type -Is a click exam the prostate remains somewhat enlarged and soft but non-tender. -The initial urine specimen today had only an occasional WBC and this is good. - POCT UA - Microscopic Only - POCT urinalysis dipstick - Urine culture  2. Acute cystitis with hematuria -This appears to be resolved. The patient still continues to have no symptoms.   Patient Instructions  The patient should continue to drink plenty of fluids If he develops any further symptoms regarding urinary tract infection he should get back in touch with [place]    Tick Bite Information Ticks are insects that attach themselves to the skin and draw blood for food. There are various types of ticks. Common types include wood ticks and deer ticks. Most ticks live in shrubs and grassy areas. Ticks can climb onto your body when you make contact with leaves or grass where the tick is waiting. The most common places on the body for ticks to attach themselves are the scalp, neck, armpits, waist, and groin. Most tick bites are harmless, but sometimes ticks carry germs that cause diseases. These germs can be spread to a person during the tick's feeding process. The chance of a disease spreading through a tick bite depends on:   The type of tick.  Time of year.   How long the tick is attached.   Geographic location.  HOW CAN YOU PREVENT TICK BITES? Take these steps to help prevent tick bites when you are  outdoors:  Wear protective clothing. Long sleeves and long pants are best.   Wear white clothes so you can see ticks more easily.  Tuck your pant legs into your socks.   If walking on a trail, stay in the middle of the trail to avoid brushing against bushes.  Avoid walking through areas with long grass.  Put insect repellent on all exposed skin and along boot tops, pant legs, and sleeve cuffs.   Check clothing, hair, and skin repeatedly and before going inside.  Brush off any ticks that are not attached.  Take a shower or bath as soon as possible after being outdoors.  WHAT IS THE PROPER WAY TO REMOVE A TICK? Ticks should be removed as soon as possible to help prevent diseases caused by tick bites. 1. If latex gloves are available, put them on before trying to remove a tick.  2. Using fine-point tweezers, grasp the tick as close to the skin as possible. You may also use curved forceps or a tick removal tool. Grasp the tick as close to its head as possible. Avoid grasping the tick on its body. 3. Pull gently with steady upward pressure until the tick lets go. Do not twist the tick or jerk it suddenly. This may break off the tick's head or mouth parts. 4. Do not squeeze or crush the tick's body. This could force disease-carrying fluids from the tick into your body.  5. After the tick is removed, wash the bite area and your hands with soap and water or other disinfectant such as alcohol. 6. Apply a small amount of antiseptic cream or ointment to the bite site.  7. Wash and disinfect any instruments that were used.  Do not try to remove a tick by applying a hot match, petroleum jelly, or fingernail polish to the tick. These methods do not work and may increase the chances of disease being spread from the tick bite.  WHEN SHOULD YOU SEEK MEDICAL CARE? Contact your health care provider if you are unable to remove a tick from your skin or if a part of the tick breaks off and is  stuck in the skin.  After a tick bite, you need to be aware of signs and symptoms that could be related to diseases spread by ticks. Contact your health care provider if you develop any of the following in the days or weeks after the tick bite:  Unexplained fever.  Rash. A circular rash that appears days or weeks after the tick bite may indicate the possibility of Lyme disease. The rash may resemble a target with a bull's-eye and may occur at a different part of your body than the tick bite.  Redness and swelling in the area of the tick bite.   Tender, swollen lymph glands.   Diarrhea.   Weight loss.   Cough.   Fatigue.   Muscle, joint, or bone pain.   Abdominal pain.   Headache.   Lethargy or a change in your level of consciousness.  Difficulty walking or moving your legs.   Numbness in the legs.   Paralysis.  Shortness of breath.   Confusion.   Repeated vomiting.  Document Released: 11/25/2000 Document Revised: 09/18/2013 Document Reviewed: 05/08/2013 [facility] Patient Information 2015 [place], [place]. This information is not intended to replace advice given to you by your health care provider. Make sure you discuss any questions you have with your health care provider.     Arrie Senate MD

## 2015-04-25 LAB — URINE CULTURE: Organism ID, Bacteria: NO GROWTH

## 2015-05-05 DIAGNOSIS — H40033 Anatomical narrow angle, bilateral: Secondary | ICD-10-CM | POA: Diagnosis not present

## 2015-05-05 DIAGNOSIS — H04123 Dry eye syndrome of bilateral lacrimal glands: Secondary | ICD-10-CM | POA: Diagnosis not present

## 2015-07-13 ENCOUNTER — Ambulatory Visit (INDEPENDENT_AMBULATORY_CARE_PROVIDER_SITE_OTHER): Payer: Medicare Other | Admitting: Physician Assistant

## 2015-07-13 ENCOUNTER — Telehealth: Payer: Self-pay | Admitting: Physician Assistant

## 2015-07-13 ENCOUNTER — Encounter: Payer: Self-pay | Admitting: Physician Assistant

## 2015-07-13 VITALS — BP 144/71 | HR 61 | Temp 97.7°F | Ht 71.0 in | Wt 186.0 lb

## 2015-07-13 DIAGNOSIS — B009 Herpesviral infection, unspecified: Secondary | ICD-10-CM | POA: Diagnosis not present

## 2015-07-13 DIAGNOSIS — L0100 Impetigo, unspecified: Secondary | ICD-10-CM

## 2015-07-13 MED ORDER — MUPIROCIN 2 % EX OINT: TOPICAL_OINTMENT | CUTANEOUS | Status: DC

## 2015-07-13 MED ORDER — VALACYCLOVIR HCL 500 MG PO TABS: ORAL_TABLET | ORAL | Status: DC

## 2015-07-13 NOTE — Patient Instructions (Signed)
Wash with gentle soap and water ( dove)  Do not apply neosporin Besides using mupirocin ointment prescribed, also use aquaphor or aveeno healing ointment regulary to prevent dryness NO LICKING LIPS

## 2015-07-13 NOTE — Addendum Note (Signed)
Addended by: Marline Backbone A on: 07/13/2015 02:16 PM   Modules accepted: Orders

## 2015-07-13 NOTE — Progress Notes (Signed)
Subjective:    Patient ID: Joseph Larsen, male    DOB: 12-18-1945, 69 y.o.   MRN: [id number]  HPI 69 y/o male presents with c/o sore lips after playing golf last week. He did not use sunscreen and feels that it got worse after using neosporin and eating hot peppers last week. Has spread to his upper lip and appears to be blister like. 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    Review of Systems  Skin:       Lips are burning, tender and feel puffy . Started on bottom lip and has spread to top lip        Objective:   Physical Exam  Constitutional: He is oriented to person, place, and time. He appears well-developed and well-nourished. No distress.  Neurological: He is alert and oriented to person, place, and time.  Skin: He is not diaphoretic.  Bottom lip has yellow crusting, very dry Erythematous     Psychiatric: He has a normal mood and affect. His behavior is normal. Judgment and thought content normal.  Nursing note and vitals reviewed.         Assessment & Plan:  1. Impetigo Do not lick lips Wash with gentle soap and water.  No neosporin  - mupirocin ointment (BACTROBAN) 2 %; Apply to lips TID x 14 days  Dispense: 22 g; Refill: 0 - Aerobic culture   RTO 2 weeks   Aury Scollard A. Chauncey Reading PA-C

## 2015-07-13 NOTE — Telephone Encounter (Signed)
Aware to also use Aveeno or Aquaphor on lips.

## 2015-07-14 DIAGNOSIS — L239 Allergic contact dermatitis, unspecified cause: Secondary | ICD-10-CM | POA: Diagnosis not present

## 2015-07-15 LAB — AEROBIC CULTURE

## 2015-07-16 ENCOUNTER — Encounter: Payer: Self-pay | Admitting: Gastroenterology

## 2015-07-17 ENCOUNTER — Telehealth: Payer: Self-pay | Admitting: Physician Assistant

## 2015-07-17 NOTE — Telephone Encounter (Signed)
Left message for second time.

## 2015-07-17 NOTE — Telephone Encounter (Signed)
-----   Message from Adella Nissen, PA-C sent at 07/17/2015  9:48 AM EDT ----- Culture was negative for bacteria. He can stop the mupirocin. Take all of valtrex. Continue using aquaphor, aveeno healing ointment or vaseline on lips. No neosporin. Remind him not to lick lips. Keep f/u in 2 weeks   Tiffany A. Benjamin Stain PA-C

## 2015-07-17 NOTE — Progress Notes (Signed)
Patient aware.

## 2015-07-21 DIAGNOSIS — H578 Other specified disorders of eye and adnexa: Secondary | ICD-10-CM | POA: Diagnosis not present

## 2015-07-27 ENCOUNTER — Ambulatory Visit: Payer: Medicare Other | Admitting: Physician Assistant

## 2015-08-26 ENCOUNTER — Encounter: Payer: Self-pay | Admitting: Family Medicine

## 2015-08-26 ENCOUNTER — Ambulatory Visit (INDEPENDENT_AMBULATORY_CARE_PROVIDER_SITE_OTHER): Payer: Medicare Other | Admitting: Family Medicine

## 2015-08-26 VITALS — BP 126/69 | HR 66 | Temp 98.9°F | Ht 71.0 in | Wt 187.8 lb

## 2015-08-26 DIAGNOSIS — L02419 Cutaneous abscess of limb, unspecified: Secondary | ICD-10-CM

## 2015-08-26 DIAGNOSIS — L03119 Cellulitis of unspecified part of limb: Secondary | ICD-10-CM

## 2015-08-26 MED ORDER — SULFAMETHOXAZOLE-TRIMETHOPRIM 800-160 MG PO TABS: 1.0000 | ORAL_TABLET | Freq: Two times a day (BID) | ORAL | Status: DC

## 2015-08-26 NOTE — Patient Instructions (Signed)
Dressing Change °A dressing is a material placed over wounds. It keeps the wound clean, dry, and protected from further injury. This provides an environment that favors wound healing.  °BEFORE YOU BEGIN °· Get your supplies together. Things you may need include: °¨ Saline solution. °¨ Flexible gauze dressing. °¨ Medicated cream. °¨ Tape. °¨ Gloves. °¨ Abdominal dressing pads. °¨ Gauze squares. °¨ Plastic bags. °· Take pain medicine 30 minutes before the dressing change if you need it. °· Take a shower before you do the first dressing change of the day. Use plastic wrap or a plastic bag to prevent the dressing from getting wet. °REMOVING YOUR OLD DRESSING  °· Wash your hands with soap and water. Dry your hands with a clean towel. °· Put on your gloves. °· Remove any tape. °· Carefully remove the old dressing. If the dressing sticks, you may dampen it with warm water to loosen it, or follow your caregiver's specific directions. °· Remove any gauze or packing tape that is in your wound. °· Take off your gloves. °· Put the gloves, tape, gauze, or any packing tape into a plastic bag. °CHANGING YOUR DRESSING °· Open the supplies. °· Take the cap off the saline solution. °· Open the gauze package so that the gauze remains on the inside of the package. °· Put on your gloves. °· Clean your wound as told by your caregiver. °· If you have been told to keep your wound dry, follow those instructions. °· Your caregiver may tell you to do one or more of the following: °¨ Pick up the gauze. Pour the saline solution over the gauze. Squeeze out the extra saline solution. °¨ Put medicated cream or other medicine on your wound if you have been told to do so. °¨ Put the solution soaked gauze only in your wound, not on the skin around it. °¨ Pack your wound loosely or as told by your caregiver. °¨ Put dry gauze on your wound. °¨ Put abdominal dressing pads over the dry gauze if your wet gauze soaks through. °· Tape the abdominal dressing  pads in place so they will not fall off. Do not wrap the tape completely around the affected part (arm, leg, abdomen). °· Wrap the dressing pads with a flexible gauze dressing to secure it in place. °· Take off your gloves. Put them in the plastic bag with the old dressing. Tie the bag shut and throw it away. °· Keep the dressing clean and dry until your next dressing change. °· Wash your hands. °SEEK MEDICAL CARE IF: °· Your skin around the wound looks red. °· Your wound feels more tender or sore. °· You see pus in the wound. °· Your wound smells bad. °· You have a fever. °· Your skin around the wound has a rash that itches and burns. °· You see black or yellow skin in your wound that was not there before. °· You feel nauseous, throw up, and feel very tired. °Document Released: 01/05/2005 Document Revised: 02/20/2012 Document Reviewed: 10/10/2011 °[employer]® Patient Information ©2015 [employer]. This information is not intended to replace advice given to you by your health care provider. Make sure you discuss any questions you have with your health care provider. ° °

## 2015-08-26 NOTE — Progress Notes (Signed)
BP 126/69 mmHg  Pulse 66  Temp(Src) 98.9 F (37.2 C)  Ht 5\' 11"  (1.803 m)  Wt 187 lb 12.8 oz (85.186 kg)  BMI 26.20 kg/m2   Subjective:    Patient ID: Joseph Larsen, male    DOB: 11/15/1946, 69 y.o.   MRN: [id number]  HPI: Joseph Larsen is a 69 y.o. male presenting on 08/26/2015 for Possible insect bite   HPI Spot on leg Patient presents with a spot on the back of his right leg. The spot initially started out as a swollen abscess that then drained of purulent material and it stayed there now as slightly scabbed over with mild redness but is not warm or draining anymore. He has been using peroxide and alcohol on it to keep it clean. He denies any fevers or chills, he denies any redness or swelling. The spot has been there about 3 weeks since it initially started.  Relevant past medical, surgical, family and social history reviewed and updated as indicated. Interim medical history since our last visit reviewed. Allergies and medications reviewed and updated.  Review of Systems  Constitutional: Negative for fever and chills.  HENT: Negative for ear discharge and ear pain.   Eyes: Negative for discharge and visual disturbance.  Respiratory: Negative for shortness of breath and wheezing.   Cardiovascular: Negative for chest pain and leg swelling.  Gastrointestinal: Negative for abdominal pain, diarrhea and constipation.  Genitourinary: Negative for difficulty urinating.  Musculoskeletal: Negative for back pain and gait problem.  Skin: Positive for color change (small amount of redness surrounding site) and wound (back of leg). Negative for rash.  Neurological: Negative for syncope, light-headedness and headaches.  All other systems reviewed and are negative.   Per HPI unless specifically indicated above     Medication List       This list is accurate as of: 08/26/15  3:39 PM.  Always use your most recent med list.               allopurinol 300 MG tablet  Commonly known as:   ZYLOPRIM  Take 300 mg by mouth daily.     amLODipine 10 MG tablet  Commonly known as:  NORVASC  Take 5 mg by mouth daily.     aspirin 81 MG tablet  Take 81 mg by mouth daily.     atenolol 100 MG tablet  Commonly known as:  TENORMIN  Take 100 mg by mouth daily.     buPROPion 200 MG 12 hr tablet  Commonly known as:  WELLBUTRIN SR  Take 200 mg by mouth 2 (two) times daily.     cholecalciferol 1000 UNITS tablet  Commonly known as:  VITAMIN D  Take 1,000 Units by mouth daily.     diazepam 10 MG tablet  Commonly known as:  VALIUM  Take 1 tablet (10 mg total) by mouth every 8 (eight) hours as needed for anxiety.     Fish Oil 1000 MG Caps  Take 4 capsules by mouth daily.     flunisolide 25 MCG/ACT (0.025%) Soln  Commonly known as:  NASALIDE  Inhale 2 sprays into the lungs as needed.     fluocinonide-emollient 0.05 % cream  Commonly known as:  LIDEX-E  Apply 1 application topically 2 (two) times daily.     hydrochlorothiazide 25 MG tablet  Commonly known as:  HYDRODIURIL  Take 25 mg by mouth daily.     mupirocin ointment 2 %  Commonly known as:  BACTROBAN  Apply to lips TID x 14 days     nystatin 100000 UNIT/ML suspension  Commonly known as:  MYCOSTATIN  TAKE FIVE MLS BY MOUTH 4 TIMES DAILY. TAKE BEFORE MEALS AND AT BEDTIME. SWISH AND SWALLOW     omeprazole 20 MG capsule  Commonly known as:  PRILOSEC  Take 20 mg by mouth daily.     QUEtiapine 50 MG tablet  Commonly known as:  SEROQUEL  Take 25 mg by mouth at bedtime.     sulfamethoxazole-trimethoprim 800-160 MG per tablet  Commonly known as:  BACTRIM DS  Take 1 tablet by mouth 2 (two) times daily.     valACYclovir 500 MG tablet  Commonly known as:  VALTREX  2 pills PO BID x 7 days     valsartan 320 MG tablet  Commonly known as:  DIOVAN  Take 320 mg by mouth daily.           Objective:    BP 126/69 mmHg  Pulse 66  Temp(Src) 98.9 F (37.2 C)  Ht 5\' 11"  (1.803 m)  Wt 187 lb 12.8 oz (85.186 kg)   BMI 26.20 kg/m2  Wt Readings from Last 3 Encounters:  08/26/15 187 lb 12.8 oz (85.186 kg)  07/13/15 186 lb (84.369 kg)  04/24/15 189 lb (85.73 kg)    Physical Exam  Constitutional: He is oriented to person, place, and time. He appears well-developed and well-nourished. No distress.  Eyes: Conjunctivae and EOM are normal. Right eye exhibits no discharge. No scleral icterus.  Cardiovascular: Normal rate, regular rhythm, normal heart sounds and intact distal pulses.   No murmur heard. Pulmonary/Chest: Effort normal and breath sounds normal. No respiratory distress. He has no wheezes.  Musculoskeletal: Normal range of motion. He exhibits no edema.  Neurological: He is alert and oriented to person, place, and time. Coordination normal.  Skin: Skin is warm and dry. Lesion noted. No rash noted. He is not diaphoretic.     Psychiatric: He has a normal mood and affect. His behavior is normal.  Vitals reviewed.   Results for orders placed or performed in visit on 07/13/15  Aerobic culture  Result Value Ref Range   Aerobic Bacterial Culture Final report    Result 1 Mixed skin flora    Dressing of wound: Apply Mepilex to the wound to help it heal quicker and also give antibiotic. Gave him 1 extra to do in 3 days and then return if not healed.    Assessment & Plan:   Problem List Items Addressed This Visit    None    Visit Diagnoses    Cellulitis and abscess of leg    -  Primary    Patient had what appeared to be cellulitis and abscess on that leg that is healing. Will give antibiotic and monitor for healing. Applied dressing of Mepilex.        Follow up plan: Return if symptoms worsen or fail to improve.  Caryl Pina, MD [facility] 08/26/2015, 3:39 PM

## 2015-09-07 ENCOUNTER — Encounter: Payer: Self-pay | Admitting: Family Medicine

## 2015-09-07 ENCOUNTER — Ambulatory Visit (INDEPENDENT_AMBULATORY_CARE_PROVIDER_SITE_OTHER): Payer: Medicare Other | Admitting: Family Medicine

## 2015-09-07 VITALS — BP 111/64 | HR 73 | Temp 97.5°F | Ht 71.0 in | Wt 187.0 lb

## 2015-09-07 DIAGNOSIS — L989 Disorder of the skin and subcutaneous tissue, unspecified: Secondary | ICD-10-CM

## 2015-09-07 MED ORDER — DOXYCYCLINE HYCLATE 100 MG PO TABS: 100.0000 mg | ORAL_TABLET | Freq: Two times a day (BID) | ORAL | Status: DC

## 2015-09-07 NOTE — Progress Notes (Signed)
   HPI  Patient presents today ere for follow-up of a skin lesion ohis right loower extremity.  Patient's planes it's been there for now for about 4 weeks. He's been treated with Bactrim which improved it but then it returned and started is again after he finished his course.  He states that the area of redness has improvedbut the central area is stable. He denies fevers, chills, sweats.   PMH: Smoking status noted ROS: Per HPI  Objective: BP 111/64 mmHg  Pulse 73  Temp(Src) 97.5 F (36.4 C) (Oral)  Ht 5\' 11"  (1.803 m)  Wt 187 lb (84.823 kg)  BMI 26.09 kg/m2 Gen: NAD, alert, cooperative with exam HEENT: NCAT CV: RRR, good S1/S2, no murmur Resp: CTABL, no wheezes, non-labored Ext: right calf with a circular erythematous lesion, area of erythema measuring2.3 cm in di, central area of honey-colored crusting measuring 8 mm in diameter Neuro: Alert and oriented, No gross deficits  Assessment and plan:  # cellulitis Will consider this and Bactrim failure as it improves but then returned with drainage Treat with doxycycline Follow-up in 2 weeks, if it is not improved will considerbiopsy to rule outsquamous cell carcinoma although I think this is very low likelihood   Meds ordered this encounter  Medications  . doxycycline (VIBRA-TABS) 100 MG tablet    Sig: Take 1 tablet (100 mg total) by mouth 2 (two) times daily. 1 po bid    Dispense:  20 tablet    Refill:  0    Laroy Apple, MD [facility] 09/07/2015, 5:27 PM

## 2015-09-07 NOTE — Patient Instructions (Signed)
Great to meet you!  Lets try a different antibiotic PLan to come back in 2 weeks, if it is better you can cancel the appointment   Cellulitis Cellulitis is an infection of the skin and the tissue beneath it. The infected area is usually red and tender. Cellulitis occurs most often in the arms and lower legs.  CAUSES  Cellulitis is caused by bacteria that enter the skin through cracks or cuts in the skin. The most common types of bacteria that cause cellulitis are staphylococci and streptococci. SIGNS AND SYMPTOMS   Redness and warmth.  Swelling.  Tenderness or pain.  Fever. DIAGNOSIS  Your health care Cesare Sumlin can usually determine what is wrong based on a physical exam. Blood tests may also be done. TREATMENT  Treatment usually involves taking an antibiotic medicine. HOME CARE INSTRUCTIONS   Take your antibiotic medicine as directed by your health care Taija Mathias. Finish the antibiotic even if you start to feel better.  Keep the infected arm or leg elevated to reduce swelling.  Apply a warm cloth to the affected area up to 4 times per day to relieve pain.  Take medicines only as directed by your health care Naveyah Iacovelli.  Keep all follow-up visits as directed by your health care Jay Kempe. SEEK MEDICAL CARE IF:   You notice red streaks coming from the infected area.  Your red area gets larger or turns dark in color.  Your bone or joint underneath the infected area becomes painful after the skin has healed.  Your infection returns in the same area or another area.  You notice a swollen bump in the infected area.  You develop new symptoms.  You have a fever. SEEK IMMEDIATE MEDICAL CARE IF:   You feel very sleepy.  You develop vomiting or diarrhea.  You have a general ill feeling (malaise) with muscle aches and pains. MAKE SURE YOU:   Understand these instructions.  Will watch your condition.  Will get help right away if you are not doing well or get  worse. Document Released: 09/07/2005 Document Revised: 04/14/2014 Document Reviewed: 02/13/2012 [facility] Patient Information 2015 [place], [place]. This information is not intended to replace advice given to you by your health care Trystan Akhtar. Make sure you discuss any questions you have with your health care Alaa Eyerman.

## 2015-09-11 NOTE — Addendum Note (Signed)
Addended by: Timmothy Euler on: 09/11/2015 08:56 AM   Modules accepted: Level of Service

## 2015-10-08 ENCOUNTER — Encounter: Payer: Self-pay | Admitting: Family Medicine

## 2015-10-08 ENCOUNTER — Ambulatory Visit (INDEPENDENT_AMBULATORY_CARE_PROVIDER_SITE_OTHER): Payer: Medicare Other | Admitting: Family Medicine

## 2015-10-08 VITALS — BP 116/69 | HR 60 | Temp 97.6°F | Ht 71.0 in | Wt 189.0 lb

## 2015-10-08 DIAGNOSIS — D485 Neoplasm of uncertain behavior of skin: Secondary | ICD-10-CM | POA: Diagnosis not present

## 2015-10-08 DIAGNOSIS — L859 Epidermal thickening, unspecified: Secondary | ICD-10-CM | POA: Diagnosis not present

## 2015-10-08 NOTE — Patient Instructions (Signed)
Great to see you again!  We will get results back to you within 1 week.   Come back if you have any worsening symptoms or concerns for infection including rednes, warmth, increased pain, or drainge from the site.

## 2015-10-08 NOTE — Progress Notes (Signed)
   HPI  Patient presents today here for follow-up of his skin lesion on his leg.  Patient explains that has been there for 4 months. Has been treated with Bactrim and doxycycline with no improvement. He denies any trauma to that area. He has a persistent scab that area that feels like a hole. Has began to hurt slightly over the last week or 2. He denies fever, chills, sweats, difficulty breathing.  PMH: Smoking status noted ROS: Per HPI  Objective: BP 116/69 mmHg  Pulse 60  Temp(Src) 97.6 F (36.4 C) (Oral)  Ht 5\' 11"  (1.803 m)  Wt 189 lb (85.73 kg)  BMI 26.37 kg/m2 Gen: NAD, alert, cooperative with exam HEENT: NCAT CV: RRR, good S1/S2, no murmur Resp: CTABL, no wheezes, non-labored Ext: No edema, warm Neuro: Alert and oriented, No gross deficits Skin: 1.0 cm X 0.6 cm shallow ulceration on posteriod R calf with surrounding area of induration, slight tenderness to palpation  Skin Bx Form consent was review Area was cleaned with an alcohol prep pad and numbed up using approximately 2.5 mL of 2% Xylocaine with epinephrine The area was then cleaned with iodine 2, white clear with alcohol prep pad, and a 2 mm punch was obtained after good analgesia was achieved. Patient tolerated easily, bleeding stopped with mild pressure, and mupirocin ointment with a Band-Aid was placed.    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 Biopsy performed today, patient tolerated well Routine wound care, follow-up after pathology results have returned    Laroy Apple, MD [facility] 10/08/2015, 3:42 PM

## 2015-10-09 ENCOUNTER — Encounter: Payer: Self-pay | Admitting: Nurse Practitioner

## 2015-10-09 ENCOUNTER — Ambulatory Visit: Payer: Medicare Other | Admitting: Nurse Practitioner

## 2015-10-09 VITALS — HR 56 | Temp 98.6°F | Ht 71.0 in | Wt 188.0 lb

## 2015-10-09 DIAGNOSIS — Z024 Encounter for examination for driving license: Secondary | ICD-10-CM

## 2015-10-09 LAB — POCT URINALYSIS DIPSTICK
BILIRUBIN UA: NEGATIVE
Blood, UA: NEGATIVE
Glucose, UA: NEGATIVE
KETONES UA: NEGATIVE
Leukocytes, UA: NEGATIVE
NITRITE UA: NEGATIVE
PROTEIN UA: NEGATIVE
Spec Grav, UA: 1.01
Urobilinogen, UA: NEGATIVE
pH, UA: 6

## 2015-10-09 NOTE — Progress Notes (Signed)
See scanned in DOT physical form

## 2015-10-12 LAB — PATHOLOGY

## 2015-10-13 ENCOUNTER — Other Ambulatory Visit: Payer: Self-pay | Admitting: Family Medicine

## 2015-10-13 DIAGNOSIS — L98499 Non-pressure chronic ulcer of skin of other sites with unspecified severity: Secondary | ICD-10-CM | POA: Insufficient documentation

## 2015-10-16 ENCOUNTER — Telehealth: Payer: Self-pay | Admitting: Family Medicine

## 2015-10-16 DIAGNOSIS — D485 Neoplasm of uncertain behavior of skin: Secondary | ICD-10-CM

## 2015-10-16 NOTE — Telephone Encounter (Signed)
Patient aware referral has been placed.

## 2015-10-16 NOTE — Telephone Encounter (Signed)
Is it ok for referral

## 2015-10-16 NOTE — Telephone Encounter (Signed)
Referring to derm for persistnet non healing ulcer.   Laroy Apple, MD [facility] 10/16/2015, 3:01 PM

## 2015-11-24 DIAGNOSIS — L570 Actinic keratosis: Secondary | ICD-10-CM | POA: Diagnosis not present

## 2015-11-24 DIAGNOSIS — D485 Neoplasm of uncertain behavior of skin: Secondary | ICD-10-CM | POA: Diagnosis not present

## 2015-11-24 DIAGNOSIS — L821 Other seborrheic keratosis: Secondary | ICD-10-CM | POA: Diagnosis not present

## 2015-11-24 DIAGNOSIS — L905 Scar conditions and fibrosis of skin: Secondary | ICD-10-CM | POA: Diagnosis not present

## 2016-01-19 ENCOUNTER — Ambulatory Visit (INDEPENDENT_AMBULATORY_CARE_PROVIDER_SITE_OTHER): Payer: Medicare Other | Admitting: Family Medicine

## 2016-01-19 ENCOUNTER — Encounter: Payer: Self-pay | Admitting: Family Medicine

## 2016-01-19 VITALS — BP 136/71 | HR 53 | Temp 97.1°F | Ht 71.0 in | Wt 190.8 lb

## 2016-01-19 DIAGNOSIS — Z0000 Encounter for general adult medical examination without abnormal findings: Secondary | ICD-10-CM | POA: Diagnosis not present

## 2016-01-19 NOTE — Progress Notes (Signed)
Subjective:    Joseph Larsen is a 70 y.o. male who presents for Medicare Annual/Subsequent preventive examination.   Preventive Screening-Counseling & Management  Tobacco History  Smoking status  . Former Smoker -- 3.00 packs/day  . Types: Cigarettes  . Start date: 02/09/1965  . Quit date: 05/12/1982  Smokeless tobacco  . Not on file    Problems Prior to Visit 1. Dry eye's using restasis for 3 weeks by Optho at the [facility]  Current Problems (verified) Patient Active Problem List   Diagnosis Date Noted  . Non-healing ulcer ([place]) 10/13/2015  . Neoplasm of uncertain behavior of skin 10/08/2015  . Skin lesion 09/07/2015  . Hypokalemia 03/03/2014  . BPH (benign prostatic hyperplasia) 03/03/2014  . Chest pain   . Palpitations   . Dyspnea on exertion   . ETOH abuse   . PTSD (post-traumatic stress disorder) 10/02/2013  . Abnormal stress test 08/29/2012  . GERD (gastroesophageal reflux disease)   . HTN (hypertension)   . Anxiety   . Depression   . Allergic rhinitis   . Diverticular disease   . Hemorrhoids   . Hiatal hernia   . Gout   . MACROCYTIC ANEMIA 08/09/2010  . NONSPEC ELEVATION OF LEVELS OF TRANSAMINASE/LDH 08/09/2010  . NONSPECIFIC ABNORMAL FINDING IN STOOL CONTENTS 08/09/2010    Medications Prior to Visit Current Outpatient Prescriptions on File Prior to Visit  Medication Sig Dispense Refill  . allopurinol (ZYLOPRIM) 300 MG tablet Take 300 mg by mouth daily.    Marland Kitchen amLODipine (NORVASC) 10 MG tablet Take 5 mg by mouth daily.    Marland Kitchen aspirin 81 MG tablet Take 81 mg by mouth daily.    Marland Kitchen atenolol (TENORMIN) 100 MG tablet Take 100 mg by mouth daily.    Marland Kitchen buPROPion (WELLBUTRIN SR) 200 MG 12 hr tablet Take 200 mg by mouth 2 (two) times daily.    . cholecalciferol (VITAMIN D) 1000 UNITS tablet Take 1,000 Units by mouth daily.    . diazepam (VALIUM) 10 MG tablet Take 1 tablet (10 mg total) by mouth every 8 (eight) hours as needed for anxiety. 90 tablet 1  . flunisolide  (NASALIDE) 0.025 % SOLN Inhale 2 sprays into the lungs as needed.    . hydrochlorothiazide (HYDRODIURIL) 25 MG tablet Take 25 mg by mouth daily.    . Omega-3 Fatty Acids (FISH OIL) 1000 MG CAPS Take 4 capsules by mouth daily.     Marland Kitchen omeprazole (PRILOSEC) 20 MG capsule Take 20 mg by mouth daily.    . QUEtiapine (SEROQUEL) 50 MG tablet Take 25 mg by mouth at bedtime.    . valsartan (DIOVAN) 320 MG tablet Take 320 mg by mouth daily.     No current facility-administered medications on file prior to visit.    Current Medications (verified) Current Outpatient Prescriptions  Medication Sig Dispense Refill  . allopurinol (ZYLOPRIM) 300 MG tablet Take 300 mg by mouth daily.    Marland Kitchen amLODipine (NORVASC) 10 MG tablet Take 5 mg by mouth daily.    Marland Kitchen aspirin 81 MG tablet Take 81 mg by mouth daily.    Marland Kitchen atenolol (TENORMIN) 100 MG tablet Take 100 mg by mouth daily.    Marland Kitchen buPROPion (WELLBUTRIN SR) 200 MG 12 hr tablet Take 200 mg by mouth 2 (two) times daily.    . cholecalciferol (VITAMIN D) 1000 UNITS tablet Take 1,000 Units by mouth daily.    . diazepam (VALIUM) 10 MG tablet Take 1 tablet (10 mg total) by mouth every  8 (eight) hours as needed for anxiety. 90 tablet 1  . flunisolide (NASALIDE) 0.025 % SOLN Inhale 2 sprays into the lungs as needed.    . hydrochlorothiazide (HYDRODIURIL) 25 MG tablet Take 25 mg by mouth daily.    . Omega-3 Fatty Acids (FISH OIL) 1000 MG CAPS Take 4 capsules by mouth daily.     Marland Kitchen omeprazole (PRILOSEC) 20 MG capsule Take 20 mg by mouth daily.    . QUEtiapine (SEROQUEL) 50 MG tablet Take 25 mg by mouth at bedtime.    . valsartan (DIOVAN) 320 MG tablet Take 320 mg by mouth daily.     No current facility-administered medications for this visit.     Allergies (verified) Enalapril maleate; Hytrin; Penicillins; and Zestril   PAST HISTORY  Family History Family History  Problem Relation Age of Onset  . Heart disease Mother     "died of chf"  . Heart disease Father     "died  of old age"  . Hypertension Sister   . Liver disease Brother     Social History Social History  Substance Use Topics  . Smoking status: Former Smoker -- 3.00 packs/day    Types: Cigarettes    Start date: 02/09/1965    Quit date: 05/12/1982  . Smokeless tobacco: Not on file  . Alcohol Use: 4.2 oz/week    7 Cans of beer per week     Comment: drinks 4-5 cans of beer/day and peach brandy daily.     Are there smokers in your home (other than you)?  No  Risk Factors Current exercise habits: 1 hour  times a week, cardio and lifting  Dietary issues discussed: None   Cardiac risk factors: advanced age (older than 11 for men, 78 for women), hypertension and male gender.  Depression Screen (Note: if answer to either of the following is "Yes", a more complete depression screening is indicated)   Q1: Over the past two weeks, have you felt down, depressed or hopeless? No  Q2: Over the past two weeks, have you felt little interest or pleasure in doing things? No  Have you lost interest or pleasure in daily life? No  Do you often feel hopeless? No  Do you cry easily over simple problems? No  Activities of Daily Living In your present state of health, do you have any difficulty performing the following activities?:  Driving? No Managing money?  No Feeding yourself? No Getting from bed to chair? No Climbing a flight of stairs? No Preparing food and eating?: No Bathing or showering? No Getting dressed: No Getting to the toilet? No Using the toilet:No Moving around from place to place: No In the past year have you fallen or had a near fall?:No   Are you sexually active?  Yes  Do you have more than one partner?  No  Hearing Difficulties: Yes Do you often ask people to speak up or repeat themselves? No Do you experience ringing or noises in your ears? No Do you have difficulty understanding soft or whispered voices? No   Do you feel that you have a problem with memory? No  Do you  often misplace items? Yes  Do you feel safe at home?  Yes  Cognitive Testing  Alert? Yes  Normal Appearance?Yes  Oriented to person? Yes  Place? Yes   Time? Yes  Recall of three objects?  Yes  Can perform simple calculations? Yes  Displays appropriate judgment?Yes  Can read the correct time from a watch  face?Yes   Advanced Directives have been discussed with the patient? Yes   List the Names of Other Physician/Practitioners you currently use: 1.  Optho at [facility], Dr. Nyoka Cowden 2. Dr. Orland Penman- Check up 3. Dr. Laurance Flatten- Annual physical  Indicate any recent Medical Services you may have received from other than [facility] providers in the past year (date may be approximate).  Immunization History  Administered Date(s) Administered  . Influenza Split 11/18/2011, 11/11/2012  . Influenza,inj,Quad PF,36+ Mos 10/02/2013  . Influenza-Unspecified 10/13/2015  . Pneumococcal Conjugate-13 03/03/2014  . Pneumococcal Polysaccharide-23 12/13/2011  . Tdap 04/11/2009  . Zoster 01/13/2012    Screening Tests Health Maintenance  Topic Date Due  . INFLUENZA VACCINE  03/11/2016 (Originally 07/13/2015)  . PNA vac Low Risk Adult (2 of 2 - PPSV23) 12/12/2016  . TETANUS/TDAP  04/12/2019  . COLONOSCOPY  08/12/2020  . ZOSTAVAX  Completed  . Hepatitis C Screening  Completed    All answers were reviewed with the patient and necessary referrals were made:  Kenn File, MD   01/19/2016   History reviewed: allergies, current medications, past family history, past medical history, past social history, past surgical history and problem list  Review of Systems Pertinent items are noted in HPI.    Objective:      Blood pressure 136/71, pulse 53, temperature 97.1 F (36.2 C), temperature source Oral, height 5\' 11"  (1.803 m), weight 190 lb 12.8 oz (86.546 kg). Body mass index is 26.62 kg/(m^2).   Gen: NAD, alert, cooperative with exam HEENT: NCAT, TMs normal BL, Oropharynx clear, PERRLA CV: RRR, good S1/S2, no  murmur Resp: CTABL, no wheezes, non-labored Abd: SNTND, BS present, no guarding or organomegaly Ext: No edema, warm Neuro: Alert and oriented, 2+ patellar tendon reflexes, Strength 5/5 and sensation intact in bilateral lower extremities      Assessment:     Joseph Larsen is a pleasant 70 year old male here for his Medicare annual wellness visit. His physical exam is normal today.  He has a very slightly reduced MMSE of 26 out of 30, however I have no concerns about his memory at this time.   His healthcare maintenance is up-to-date       Plan:     During the course of the visit the patient was educated and counseled about appropriate screening and preventive services including:    Advanced directives: has NO advanced directive  - add't info requested. Referral to SW: no  Diet review for nutrition referral? Yes ____  Not Indicated _X___   Patient Instructions (the written plan) was given to the patient.  Medicare Attestation I have personally reviewed: The patient's medical and social history Their use of alcohol, tobacco or illicit drugs Their current medications and supplements The patient's functional ability including ADLs,fall risks, home safety risks, cognitive, and hearing and visual impairment Diet and physical activities Evidence for depression or mood disorders  The patient's weight, height, BMI, and visual acuity have been recorded in the chart.  I have made referrals, counseling, and provided education to the patient based on review of the above and I have provided the patient with a written personalized care plan for preventive services.     Kenn File, MD   01/19/2016

## 2016-04-30 ENCOUNTER — Ambulatory Visit (INDEPENDENT_AMBULATORY_CARE_PROVIDER_SITE_OTHER): Payer: Medicare Other | Admitting: Nurse Practitioner

## 2016-04-30 VITALS — BP 144/83 | HR 55 | Temp 97.5°F | Ht 71.0 in | Wt 189.0 lb

## 2016-04-30 DIAGNOSIS — J029 Acute pharyngitis, unspecified: Secondary | ICD-10-CM | POA: Diagnosis not present

## 2016-04-30 DIAGNOSIS — J039 Acute tonsillitis, unspecified: Secondary | ICD-10-CM

## 2016-04-30 MED ORDER — AZITHROMYCIN 250 MG PO TABS: ORAL_TABLET | ORAL | Status: DC

## 2016-04-30 NOTE — Patient Instructions (Signed)

## 2016-04-30 NOTE — Progress Notes (Signed)
  Subjective:     Joseph Larsen is a 70 y.o. male who presents for evaluation of sore throat. Associated symptoms include nasal blockage, post nasal drip, sinus and nasal congestion and sore throat. Onset of symptoms was 6 days ago, and have been gradually worsening since that time. He is drinking plenty of fluids. He has not had a recent close exposure to someone with proven streptococcal pharyngitis.  The following portions of the patient's history were reviewed and updated as appropriate: allergies, current medications, past family history, past medical history, past social history, past surgical history and problem list.  Review of Systems Pertinent items are noted in HPI.    Objective:    BP 144/83 mmHg  Pulse 55  Temp(Src) 97.5 F (36.4 C) (Oral)  Ht 5\' 11"  (1.803 m)  Wt 189 lb (85.73 kg)  BMI 26.37 kg/m2 General appearance: alert and cooperative Eyes: conjunctivae/corneas clear. PERRL, EOM's intact. Fundi benign. Ears: normal TM's and external ear canals both ears Nose: clear discharge, moderate congestion, turbinates red, no sinus tenderness Throat: lips, mucosa, and tongue normal; teeth and gums normal Neck: no adenopathy, no carotid bruit, no JVD, supple, symmetrical, trachea midline and thyroid not enlarged, symmetric, no tenderness/mass/nodules Lungs: clear to auscultation bilaterally Heart: regular rate and rhythm, S1, S2 normal, no murmur, click, rub or gallop  Laboratory Strep test done. Results:negative.    Assessment:    Acute pharyngitis, likely  Bacterial tonsillitis R/O strep.    Plan:   Force fluids Motrin or tylenol OTC OTC decongestant Throat lozenges if help New toothbrush in 3 days  Meds ordered this encounter  Medications  . azithromycin (ZITHROMAX Z-PAK) 250 MG tablet    Sig: As directed    Dispense:  1 each    Refill:  0    Order Specific Question:  Supervising Provider    Answer:  Chipper Herb [[id number]]   [place], FNP

## 2016-05-02 LAB — CULTURE, GROUP A STREP

## 2016-05-02 LAB — RAPID STREP SCREEN (MED CTR MEBANE ONLY): Strep Gp A Ag, IA W/Reflex: NEGATIVE

## 2016-05-04 DIAGNOSIS — H40033 Anatomical narrow angle, bilateral: Secondary | ICD-10-CM | POA: Diagnosis not present

## 2016-05-04 DIAGNOSIS — H1013 Acute atopic conjunctivitis, bilateral: Secondary | ICD-10-CM | POA: Diagnosis not present

## 2016-05-19 ENCOUNTER — Ambulatory Visit (AMBULATORY_SURGERY_CENTER): Payer: Self-pay

## 2016-05-19 VITALS — Ht 71.0 in | Wt 192.2 lb

## 2016-05-19 DIAGNOSIS — K2950 Unspecified chronic gastritis without bleeding: Secondary | ICD-10-CM

## 2016-05-19 NOTE — Progress Notes (Signed)
No allergies to eggs or soy No past problems with anesthesia No diet meds No home oxygen  Declined emmi 

## 2016-05-31 DIAGNOSIS — D235 Other benign neoplasm of skin of trunk: Secondary | ICD-10-CM | POA: Diagnosis not present

## 2016-05-31 DIAGNOSIS — D485 Neoplasm of uncertain behavior of skin: Secondary | ICD-10-CM | POA: Diagnosis not present

## 2016-05-31 DIAGNOSIS — L570 Actinic keratosis: Secondary | ICD-10-CM | POA: Diagnosis not present

## 2016-05-31 DIAGNOSIS — L905 Scar conditions and fibrosis of skin: Secondary | ICD-10-CM | POA: Diagnosis not present

## 2016-05-31 DIAGNOSIS — L821 Other seborrheic keratosis: Secondary | ICD-10-CM | POA: Diagnosis not present

## 2016-06-15 ENCOUNTER — Encounter: Payer: Self-pay | Admitting: Nurse Practitioner

## 2016-06-15 ENCOUNTER — Ambulatory Visit (INDEPENDENT_AMBULATORY_CARE_PROVIDER_SITE_OTHER): Payer: Medicare Other | Admitting: Nurse Practitioner

## 2016-06-15 VITALS — BP 133/74 | HR 68 | Temp 97.7°F | Ht 71.0 in | Wt 188.0 lb

## 2016-06-15 DIAGNOSIS — J069 Acute upper respiratory infection, unspecified: Secondary | ICD-10-CM

## 2016-06-15 MED ORDER — AZITHROMYCIN 250 MG PO TABS: ORAL_TABLET | ORAL | Status: DC

## 2016-06-15 NOTE — Patient Instructions (Signed)

## 2016-06-15 NOTE — Progress Notes (Signed)
  Subjective:     Joseph Larsen is a 70 y.o. male who presents for evaluation of sinus pain. Symptoms include: congestion, cough, facial pain and headaches. Onset of symptoms was 1 day ago. Symptoms have been unchanged since that time. Past history is significant for no history of pneumonia or bronchitis. Patient is a non-smoker.  The following portions of the patient's history were reviewed and updated as appropriate: allergies, current medications, past family history, past medical history, past social history, past surgical history and problem list.  Review of Systems Pertinent items are noted in HPI.   Objective:    BP 133/74 mmHg  Pulse 68  Temp(Src) 97.7 F (36.5 C) (Oral)  Ht 5\' 11"  (1.803 m)  Wt 188 lb (85.276 kg)  BMI 26.23 kg/m2 General appearance: alert and cooperative Eyes: conjunctivae/corneas clear. PERRL, EOM's intact. Fundi benign. Ears: normal TM's and external ear canals both ears Nose: green discharge, mild congestion, turbinates red, no sinus tenderness Throat: lips, mucosa, and tongue normal; teeth and gums normal Neck: no adenopathy, no carotid bruit, no JVD, supple, symmetrical, trachea midline and thyroid not enlarged, symmetric, no tenderness/mass/nodules Lungs: clear to auscultation bilaterally and dry cough Heart: regular rate and rhythm, S1, S2 normal, no murmur, click, rub or gallop    Assessment:    Acute upper respiratory infection .    Plan:   1. Take meds as prescribed 2. Use a cool mist humidifier especially during the winter months and when heat has been humid. 3. Use saline nose sprays frequently 4. Saline irrigations of the nose can be very helpful if done frequently.  * 4X daily for 1 week*  * Use of a nettie pot can be helpful with this. Follow directions with this* 5. Drink plenty of fluids 6. Keep thermostat turn down low 7.For any cough or congestion  Use plain Mucinex- regular strength or max strength is fine   * Children- consult  with Pharmacist for dosing 8. For fever or aces or pains- take tylenol or ibuprofen appropriate for age and weight.  * for fevers greater than 101 orally you may alternate ibuprofen and tylenol every  3 hours.   Meds ordered this encounter  Medications  . azithromycin (ZITHROMAX) 250 MG tablet    Sig: Two tablets day one, then one tablet daily next 4 days.    Dispense:  6 tablet    Refill:  0    Order Specific Question:  Supervising Provider    Answer:  Eustaquio Maize [[id number]]   Mary-Margaret Hassell Done, FNP

## 2016-06-21 ENCOUNTER — Telehealth: Payer: Self-pay | Admitting: Gastroenterology

## 2016-06-21 ENCOUNTER — Encounter: Payer: Self-pay | Admitting: Nurse Practitioner

## 2016-06-21 ENCOUNTER — Ambulatory Visit (INDEPENDENT_AMBULATORY_CARE_PROVIDER_SITE_OTHER): Payer: Medicare Other | Admitting: Nurse Practitioner

## 2016-06-21 VITALS — BP 131/74 | HR 79 | Temp 97.4°F | Ht 71.0 in | Wt 191.0 lb

## 2016-06-21 DIAGNOSIS — J069 Acute upper respiratory infection, unspecified: Secondary | ICD-10-CM

## 2016-06-21 DIAGNOSIS — B9789 Other viral agents as the cause of diseases classified elsewhere: Principal | ICD-10-CM

## 2016-06-21 NOTE — Patient Instructions (Signed)

## 2016-06-21 NOTE — Progress Notes (Signed)
   Subjective:    Patient ID: Joseph Larsen, male    DOB: 1946-02-04, 70 y.o.   MRN: [id number]  HPI Patient was seen on July 5/17 with URI - was given a zpak which he just finished yesterday.Patient said symptoms git better but started coming back yesterday- sore throat and congestion with cough- worse today then yesterday. No fever.    Review of Systems  Constitutional: Negative for fever and chills.  HENT: Positive for congestion and sore throat. Negative for trouble swallowing and voice change.   Respiratory: Positive for cough. Negative for shortness of breath.   Cardiovascular: Negative.   Genitourinary: Negative.   Neurological: Negative.   Psychiatric/Behavioral: Negative.   All other systems reviewed and are negative.      Objective:   Physical Exam  Constitutional: He is oriented to person, place, and time. He appears well-developed and well-nourished. No distress.  HENT:  Right Ear: Hearing, tympanic membrane, external ear and ear canal normal.  Left Ear: Hearing, tympanic membrane, external ear and ear canal normal.  Nose: Mucosal edema and rhinorrhea present. Right sinus exhibits no maxillary sinus tenderness and no frontal sinus tenderness. Left sinus exhibits no maxillary sinus tenderness and no frontal sinus tenderness.  Mouth/Throat: Uvula is midline, oropharynx is clear and moist and mucous membranes are normal.  Cardiovascular: Normal rate, regular rhythm and normal heart sounds.   Pulmonary/Chest: Effort normal and breath sounds normal.  Neurological: He is alert and oriented to person, place, and time.  Skin: Skin is warm.  Psychiatric: He has a normal mood and affect. His behavior is normal. Judgment and thought content normal.    BP 131/74 mmHg  Pulse 79  Temp(Src) 97.4 F (36.3 C) (Oral)  Ht 5\' 11"  (1.803 m)  Wt 191 lb (86.637 kg)  BMI 26.65 kg/m2       Assessment & Plan:   1. Viral URI with cough    1. Take meds as prescribed 2. Use a cool  mist humidifier especially during the winter months and when heat has been humid. 3. Use saline nose sprays frequently 4. Saline irrigations of the nose can be very helpful if done frequently.  * 4X daily for 1 week*  * Use of a nettie pot can be helpful with this. Follow directions with this* 5. Drink plenty of fluids 6. Keep thermostat turn down low 7.For any cough or congestion  Use plain Mucinex- regular strength or max strength is fine   * Children- consult with Pharmacist for dosing 8. For fever or aces or pains- take tylenol or ibuprofen appropriate for age and weight.  * for fevers greater than 101 orally you may alternate ibuprofen and tylenol every  3 hours.   Mary-Margaret Hassell Done, FNP

## 2016-06-21 NOTE — Telephone Encounter (Signed)
Patient has a productive cough.  He completed his antibiotics.  He is advised ok to proceed with procedure as long as he is not running a fever

## 2016-06-24 ENCOUNTER — Encounter: Payer: Self-pay | Admitting: Gastroenterology

## 2016-06-24 ENCOUNTER — Ambulatory Visit (AMBULATORY_SURGERY_CENTER): Payer: Medicare Other | Admitting: Gastroenterology

## 2016-06-24 VITALS — BP 117/81 | HR 60 | Temp 98.0°F | Resp 19 | Ht 71.0 in | Wt 192.0 lb

## 2016-06-24 DIAGNOSIS — F341 Dysthymic disorder: Secondary | ICD-10-CM | POA: Diagnosis not present

## 2016-06-24 DIAGNOSIS — K3189 Other diseases of stomach and duodenum: Secondary | ICD-10-CM | POA: Diagnosis not present

## 2016-06-24 DIAGNOSIS — K31A0 Gastric intestinal metaplasia, unspecified: Secondary | ICD-10-CM

## 2016-06-24 DIAGNOSIS — K2950 Unspecified chronic gastritis without bleeding: Secondary | ICD-10-CM

## 2016-06-24 DIAGNOSIS — K219 Gastro-esophageal reflux disease without esophagitis: Secondary | ICD-10-CM | POA: Diagnosis not present

## 2016-06-24 DIAGNOSIS — I10 Essential (primary) hypertension: Secondary | ICD-10-CM | POA: Diagnosis not present

## 2016-06-24 MED ORDER — SODIUM CHLORIDE 0.9 % IV SOLN: 500.0000 mL | INTRAVENOUS | Status: DC

## 2016-06-24 NOTE — Op Note (Signed)
[facility] Patient Name: Joseph Larsen Procedure Date: 06/24/2016 9:08 AM MRN: [id number] Endoscopist: Ladene Artist , MD Age: 70 Referring MD:  Date of Birth: May 08, 1946 Gender: Male Account #: [id number] Procedure:                Upper GI endoscopy Indications:              Surveillance for malignancy due to personal history                            of premalignant condition. Chronic gastritis with                            intestinal metaplasia. Medicines:                Monitored Anesthesia Care Procedure:                Pre-Anesthesia Assessment:                           - Prior to the procedure, a History and Physical                            was performed, and patient medications and                            allergies were reviewed. The patient's tolerance of                            previous anesthesia was also reviewed. The risks                            and benefits of the procedure and the sedation                            options and risks were discussed with the patient.                            All questions were answered, and informed consent                            was obtained. Prior Anticoagulants: The patient has                            taken no previous anticoagulant or antiplatelet                            agents. ASA Grade Assessment: II - A patient with                            mild systemic disease. After reviewing the risks                            and benefits, the patient was deemed in  satisfactory condition to undergo the procedure.                           After obtaining informed consent, the endoscope was                            passed under direct vision. Throughout the                            procedure, the patient's blood pressure, pulse, and                            oxygen saturations were monitored continuously. The                            Model GIF-[id number]) scope  was introduced                            through the mouth, and advanced to the second part                            of duodenum. The upper GI endoscopy was                            accomplished without difficulty. The patient                            tolerated the procedure well. Scope In: Scope Out: Findings:                 Diffuse moderate inflammation characterized by                            friability and granularity was found in the entire                            examined stomach. Biopsies were taken with a cold                            forceps for histology and separately submitted from                            the antrum, body and fundus. .                           A small hiatal hernia was present.                           The examined esophagus was normal.                           The duodenal bulb and second portion of the                            duodenum were normal. Complications:  No immediate complications. Estimated Blood Loss:     Estimated blood loss was minimal. Impression:               - Chronic diffuse gastritis. Biopsied.                           - Small hiatal hernia.                           - Normal esophagus.                           - Normal duodenal bulb and second portion of the                            duodenum. Recommendation:           - Patient has a contact number available for                            emergencies. The signs and symptoms of potential                            delayed complications were discussed with the                            patient. Return to normal activities tomorrow.                            Written discharge instructions were provided to the                            patient.                           - Resume previous diet.                           - Continue present medications.                           - Await pathology results.                           - Repeat upper  endoscopy in 5 years for                            surveillance pending pathology review. Ladene Artist, MD 06/24/2016 9:24:03 AM This report has been signed electronically.

## 2016-06-24 NOTE — Progress Notes (Signed)
To recovery vss report to pam rn

## 2016-06-24 NOTE — Progress Notes (Signed)
Called to room to assist during endoscopic procedure.  Patient ID and intended procedure confirmed with present staff. Received instructions for my participation in the procedure from the performing physician.  

## 2016-06-24 NOTE — Patient Instructions (Signed)
YOU HAD AN ENDOSCOPIC PROCEDURE TODAY AT THE [facility]:   Refer to the procedure report that was given to you for any specific questions about what was found during the examination.  If the procedure report does not answer your questions, please call your gastroenterologist to clarify.  If you requested that your care partner not be given the details of your procedure findings, then the procedure report has been included in a sealed envelope for you to review at your convenience later.  YOU SHOULD EXPECT: Some feelings of bloating in the abdomen. Passage of more gas than usual.  Walking can help get rid of the air that was put into your GI tract during the procedure and reduce the bloating. If you had a lower endoscopy (such as a colonoscopy or flexible sigmoidoscopy) you may notice spotting of blood in your stool or on the toilet paper. If you underwent a bowel prep for your procedure, you may not have a normal bowel movement for a few days.  Please Note:  You might notice some irritation and congestion in your nose or some drainage.  This is from the oxygen used during your procedure.  There is no need for concern and it should clear up in a day or so.  SYMPTOMS TO REPORT IMMEDIATELY:     Following upper endoscopy (EGD)  Vomiting of blood or coffee ground material  New chest pain or pain under the shoulder blades  Painful or persistently difficult swallowing  New shortness of breath  Fever of 100F or higher  Black, tarry-looking stools  For urgent or emergent issues, a gastroenterologist can be reached at any hour by calling [phone].   DIET: Your first meal following the procedure should be a small meal and then it is ok to progress to your normal diet. Heavy or fried foods are harder to digest and may make you feel nauseous or bloated.  Likewise, meals heavy in dairy and vegetables can increase bloating.  Drink plenty of fluids but you should avoid alcoholic beverages  for 24 hours.  ACTIVITY:  You should plan to take it easy for the rest of today and you should NOT DRIVE or use heavy machinery until tomorrow (because of the sedation medicines used during the test).    FOLLOW UP: Our staff will call the number listed on your records the next business day following your procedure to check on you and address any questions or concerns that you may have regarding the information given to you following your procedure. If we do not reach you, we will leave a message.  However, if you are feeling well and you are not experiencing any problems, there is no need to return our call.  We will assume that you have returned to your regular daily activities without incident.  If any biopsies were taken you will be contacted by phone or by letter within the next 1-3 weeks.  Please call us at [phone] if you have not heard about the biopsies in 3 weeks.    SIGNATURES/CONFIDENTIALITY: You and/or your care partner have signed paperwork which will be entered into your electronic medical record.  These signatures attest to the fact that that the information above on your After Visit Summary has been reviewed and is understood.  Full responsibility of the confidentiality of this discharge information lies with you and/or your care-partner.    AWAIT BIOPSY RESULTS  

## 2016-06-27 ENCOUNTER — Telehealth: Payer: Self-pay

## 2016-06-27 NOTE — Telephone Encounter (Signed)
  Follow up Call-  Call back number 06/24/2016  Post procedure Call Back phone  # [phone]  Permission to leave phone message Yes     Patient questions:  Do you have a fever, pain , or abdominal swelling? No. Pain Score  0 *  Have you tolerated food without any problems? Yes.    Have you been able to return to your normal activities? Yes.    Do you have any questions about your discharge instructions: Diet   No. Medications  No. Follow up visit  No.  Do you have questions or concerns about your Care? No.  Actions: * If pain score is 4 or above: No action needed, pain <4.

## 2016-06-29 DIAGNOSIS — L570 Actinic keratosis: Secondary | ICD-10-CM | POA: Diagnosis not present

## 2016-08-22 ENCOUNTER — Telehealth: Payer: Self-pay | Admitting: Family Medicine

## 2016-08-22 NOTE — Telephone Encounter (Signed)
Pt states he will get his at the [place]

## 2016-08-30 ENCOUNTER — Telehealth: Payer: Self-pay | Admitting: Gastroenterology

## 2016-08-30 NOTE — Telephone Encounter (Signed)
Patient provided info for appt at [facility]. He is asked to call them to clarify office location

## 2016-09-08 ENCOUNTER — Telehealth: Payer: Self-pay | Admitting: Family Medicine

## 2016-09-12 DIAGNOSIS — K22710 Barrett's esophagus with low grade dysplasia: Secondary | ICD-10-CM | POA: Diagnosis not present

## 2016-09-12 DIAGNOSIS — Q403 Congenital malformation of stomach, unspecified: Secondary | ICD-10-CM | POA: Diagnosis not present

## 2016-09-12 DIAGNOSIS — K3189 Other diseases of stomach and duodenum: Secondary | ICD-10-CM | POA: Diagnosis not present

## 2016-09-12 DIAGNOSIS — K2950 Unspecified chronic gastritis without bleeding: Secondary | ICD-10-CM | POA: Diagnosis not present

## 2016-09-22 NOTE — Telephone Encounter (Signed)
Patient's call returned, questions answered.

## 2016-09-26 ENCOUNTER — Encounter: Payer: Self-pay | Admitting: Family Medicine

## 2016-09-26 ENCOUNTER — Ambulatory Visit (INDEPENDENT_AMBULATORY_CARE_PROVIDER_SITE_OTHER): Payer: Self-pay | Admitting: Family Medicine

## 2016-09-26 VITALS — BP 121/73 | HR 55 | Temp 97.9°F | Ht 70.0 in | Wt 194.0 lb

## 2016-09-26 DIAGNOSIS — Z024 Encounter for examination for driving license: Secondary | ICD-10-CM

## 2016-09-26 LAB — URINALYSIS
Bilirubin, UA: NEGATIVE
GLUCOSE, UA: NEGATIVE
KETONES UA: NEGATIVE
Nitrite, UA: NEGATIVE
PROTEIN UA: NEGATIVE
RBC, UA: NEGATIVE
Specific Gravity, UA: 1.01 (ref 1.005–1.030)
UUROB: 2 mg/dL — AB (ref 0.2–1.0)
pH, UA: 7 (ref 5.0–7.5)

## 2016-09-26 NOTE — Progress Notes (Signed)
Subjective:  Patient ID: Joseph Larsen, male    DOB: [birth year]  Age: 70 y.o. MRN: [id number]  CC: DOT (pe)   HPI VYRON SHARPLEY presents for DOT exam. Tx for HTN & anxiety   History Daimeon has a past medical history of Abnormal ECG; Allergic rhinitis; Anxiety; Depression; Diverticular disease; Dyspnea on exertion; ETOH abuse; GERD (gastroesophageal reflux disease); Gout; Hemorrhoids; Hiatal hernia; HTN (hypertension); Low testosterone; MACROCYTIC ANEMIA; NONSPEC ELEVATION OF LEVELS OF TRANSAMINASE/LDH; Nonspecific abnormal finding in stool contents; Palpitations; and PTSD (post-traumatic stress disorder).   He has a past surgical history that includes Knee surgery (Bilateral) and Cholecystectomy (2004).   His family history includes Heart disease in his father and mother; Hypertension in his sister; Liver disease in his brother.He reports that he quit smoking about 34 years ago. His smoking use included Cigarettes. He started smoking about 51 years ago. He smoked 3.00 packs per day. He has never used smokeless tobacco. He reports that he drinks about 8.4 oz of alcohol per week . He reports that he does not use drugs.    ROS Review of Systems  Constitutional: Negative for chills, diaphoresis, fever and unexpected weight change.  HENT: Negative for congestion, hearing loss, rhinorrhea and sore throat.   Eyes: Negative for visual disturbance.  Respiratory: Negative for cough and shortness of breath.   Cardiovascular: Negative for chest pain.  Gastrointestinal: Negative for abdominal pain, constipation and diarrhea.  Genitourinary: Negative for dysuria and flank pain.  Musculoskeletal: Negative for arthralgias and joint swelling.  Skin: Negative for rash.  Neurological: Negative for dizziness and headaches.  Psychiatric/Behavioral: Negative for dysphoric mood and sleep disturbance.    Objective:  BP 121/73 (BP Location: Left Arm)   Pulse (!) 55   Temp 97.9 F (36.6 C) (Oral)   Ht 5'  10" (1.778 m)   Wt 194 lb (88 kg)   BMI 27.84 kg/m   BP Readings from Last 3 Encounters:  09/26/16 121/73  06/24/16 117/81  06/21/16 131/74    Wt Readings from Last 3 Encounters:  09/26/16 194 lb (88 kg)  06/24/16 192 lb (87.1 kg)  06/21/16 191 lb (86.6 kg)     Physical Exam  Constitutional: He is oriented to person, place, and time. He appears well-developed and well-nourished. No distress.  HENT:  Head: Normocephalic and atraumatic.  Right Ear: External ear normal.  Left Ear: External ear normal.  Nose: Nose normal.  Mouth/Throat: Oropharynx is clear and moist.  Eyes: Conjunctivae and EOM are normal. Pupils are equal, round, and reactive to light.  Neck: Normal range of motion. Neck supple. No thyromegaly present.  Cardiovascular: Normal rate, regular rhythm and normal heart sounds.   No murmur heard. Pulmonary/Chest: Effort normal and breath sounds normal. No respiratory distress. He has no wheezes. He has no rales.  Abdominal: Soft. Bowel sounds are normal. He exhibits no distension. There is no tenderness.  Lymphadenopathy:    He has no cervical adenopathy.  Neurological: He is alert and oriented to person, place, and time. He has normal reflexes.  Skin: Skin is warm and dry.  Psychiatric: He has a normal mood and affect. His behavior is normal. Judgment and thought content normal.     Lab Results  Component Value Date   WBC 5.9 03/25/2015   HGB 15.2 03/25/2015   HCT 48.7 03/25/2015   PLT 174 11/18/2014   GLUCOSE 81 03/25/2015   CHOL 126 03/25/2015   TRIG 82 03/25/2015   HDL 38 (L)  03/25/2015   LDLCALC 54 07/17/2014   ALT 29 03/25/2015   AST 34 03/25/2015   NA 138 03/25/2015   K 3.8 03/25/2015   CL 97 03/25/2015   CREATININE 0.84 03/25/2015   BUN 12 03/25/2015   CO2 27 03/25/2015   TSH 1.50 12/24/2013   PSA 0.8 03/25/2015   INR 1.1 ratio (H) 08/09/2010    No results found.  Assessment & Plan:   Delshon was seen today for dot.  Diagnoses and all  orders for this visit:  Encounter for Department of Transportation (DOT) examination for driving license renewal -     Urinalysis     I am having Mr. Simard maintain his flunisolide, aspirin, amLODipine, hydrochlorothiazide, QUEtiapine, valsartan, atenolol, allopurinol, buPROPion, omeprazole, diazepam, cholecalciferol, and Fish Oil.  No orders of the defined types were placed in this encounter.    Follow-up: No Follow-up on file.  Claretta Fraise, M.D.

## 2016-12-20 ENCOUNTER — Telehealth: Payer: Self-pay | Admitting: Family Medicine

## 2017-01-03 ENCOUNTER — Encounter: Payer: Self-pay | Admitting: Pharmacist

## 2017-01-03 ENCOUNTER — Ambulatory Visit (INDEPENDENT_AMBULATORY_CARE_PROVIDER_SITE_OTHER): Payer: Medicare Other | Admitting: Pharmacist

## 2017-01-03 VITALS — BP 136/82 | HR 72 | Ht 70.0 in | Wt 194.0 lb

## 2017-01-03 DIAGNOSIS — R5382 Chronic fatigue, unspecified: Secondary | ICD-10-CM

## 2017-01-03 DIAGNOSIS — Z0000 Encounter for general adult medical examination without abnormal findings: Secondary | ICD-10-CM | POA: Diagnosis not present

## 2017-01-03 DIAGNOSIS — K2270 Barrett's esophagus without dysplasia: Secondary | ICD-10-CM | POA: Insufficient documentation

## 2017-01-03 DIAGNOSIS — K22710 Barrett's esophagus with low grade dysplasia: Secondary | ICD-10-CM

## 2017-01-03 DIAGNOSIS — I10 Essential (primary) hypertension: Secondary | ICD-10-CM

## 2017-01-03 NOTE — Patient Instructions (Addendum)
Joseph Larsen , Thank you for taking time to come for your Medicare Wellness Visit. I appreciate your ongoing commitment to your health goals. Please review the following plan we discussed and let me know if I can assist you in the future.   These are the goals we discussed:  Look for copy of [place] (important to know where these are kept - you can also bring copy to our office to be placed in our file / electronic chart)  Increase non-starchy vegetables - carrots, green bean, squash, zucchini, tomatoes, onions, peppers, spinach and other green leafy vegetables, cabbage, lettuce, cucumbers, asparagus, okra (not fried), eggplant Limit sugar and processed foods (cakes, cookies, ice cream, crackers and chips) Increase fresh fruit - oranges, pears, grapes, apples, kiwi, melons and berries Limit red meat to no more than 1-2 times per week (serving size about the size of your palm) Choose whole grains / lean proteins - whole wheat bread, quinoa, whole grain rice (1/2 cup), fish, chicken, [place] Avoid sugar and calorie containing beverages - soda, sweet tea and juice.  Choose water or unsweetened tea instead.   This is a list of the screening recommended for you and due dates:  Health Maintenance  Topic Date Due  . Tetanus Vaccine  04/12/2019  . Colon Cancer Screening  09/07/2020  . Flu Shot  Completed  . Shingles Vaccine  Completed  .  Hepatitis C: One time screening is recommended by Center for Disease Control  (CDC) for  adults born from [birth year] through 1965.   Completed  . Pneumonia vaccines  Completed   Health Maintenance, Male A healthy lifestyle and preventative care can promote health and wellness.  Maintain regular health, dental, and eye exams.  Eat a healthy diet. Foods like vegetables, fruits, whole grains, low-fat dairy products, and lean protein foods contain the nutrients you need and are low in calories. Decrease your intake  of foods high in solid fats, added sugars, and salt. Get information about a proper diet from your health care provider, if necessary.  Regular physical exercise is one of the most important things you can do for your health. Most adults should get at least 150 minutes of moderate-intensity exercise (any activity that increases your heart rate and causes you to sweat) each week. In addition, most adults need muscle-strengthening exercises on 2 or more days a week.   Maintain a healthy weight. The body mass index (BMI) is a screening tool to identify possible weight problems. It provides an estimate of body fat based on height and weight. Your health care provider can find your BMI and can help you achieve or maintain a healthy weight. For males 20 years and older:  A BMI below 18.5 is considered underweight.  A BMI of 18.5 to 24.9 is normal.  A BMI of 25 to 29.9 is considered overweight.  A BMI of 30 and above is considered obese.  Maintain normal blood lipids and cholesterol by exercising and minimizing your intake of saturated fat. Eat a balanced diet with plenty of fruits and vegetables. Blood tests for lipids and cholesterol should begin at age 41 and be repeated every 5 years. If your lipid or cholesterol levels are high, you are over age 7, or you are at high risk for heart disease, you may need your cholesterol levels checked more frequently.Ongoing high lipid and cholesterol levels should be treated with medicines if diet and exercise are not working.  If you smoke, find out from your health care provider how to quit. If you do not use tobacco, do not start.  Lung cancer screening is recommended for adults aged 58-80 years who are at high risk for developing lung cancer because of a history of smoking. A yearly low-dose CT scan of the lungs is recommended for people who have at least a 30-pack-year history of smoking and are current smokers or have quit within the past 15 years. A pack  year of smoking is smoking an average of 1 pack of cigarettes a day for 1 year (for example, a 30-pack-year history of smoking could mean smoking 1 pack a day for 30 years or 2 packs a day for 15 years). Yearly screening should continue until the smoker has stopped smoking for at least 15 years. Yearly screening should be stopped for people who develop a health problem that would prevent them from having lung cancer treatment.  If you choose to drink alcohol, do not have more than 2 drinks per day. One drink is considered to be 12 oz (360 mL) of beer, 5 oz (150 mL) of wine, or 1.5 oz (45 mL) of liquor.  Avoid the use of street drugs. Do not share needles with anyone. Ask for help if you need support or instructions about stopping the use of drugs.  High blood pressure causes heart disease and increases the risk of stroke. High blood pressure is more likely to develop in:  People who have blood pressure in the end of the normal range (100-139/85-89 mm Hg).  People who are overweight or obese.  People who are African American.  If you are 5-13 years of age, have your blood pressure checked every 3-5 years. If you are 59 years of age or older, have your blood pressure checked every year. You should have your blood pressure measured twice-once when you are at a hospital or clinic, and once when you are not at a hospital or clinic. Record the average of the two measurements. To check your blood pressure when you are not at a hospital or clinic, you can use:  An automated blood pressure machine at a pharmacy.  A home blood pressure monitor.  If you are 65-12 years old, ask your health care provider if you should take aspirin to prevent heart disease.  Diabetes screening involves taking a blood sample to check your fasting blood sugar level. This should be done once every 3 years after age 54 if you are at a normal weight and without risk factors for diabetes. Testing should be considered at a  younger age or be carried out more frequently if you are overweight and have at least 1 risk factor for diabetes.  Colorectal cancer can be detected and often prevented. Most routine colorectal cancer screening begins at the age of 66 and continues through age 46. However, your health care provider may recommend screening at an earlier age if you have risk factors for colon cancer. On a yearly basis, your health care provider may provide home test kits to check for hidden blood in the stool. A small camera at the end of a tube may be used to directly examine the colon (sigmoidoscopy or colonoscopy) to detect the earliest forms of colorectal cancer. Talk to your health care provider about this at age 16 when routine screening begins. A direct exam of the colon should be repeated every 5-10 years through age 2, unless early forms of precancerous polyps or small  growths are found.  People who are at an increased risk for hepatitis B should be screened for this virus. You are considered at high risk for hepatitis B if:  You were born in a country where hepatitis B occurs often. Talk with your health care provider about which countries are considered high risk.  Your parents were born in a high-risk country and you have not received a shot to protect against hepatitis B (hepatitis B vaccine).  You have HIV or AIDS.  You use needles to inject street drugs.  You live with, or have sex with, someone who has hepatitis B.  You are a man who has sex with other men (MSM).  You get hemodialysis treatment.  You take certain medicines for conditions like cancer, organ transplantation, and autoimmune conditions.  Hepatitis C blood testing is recommended for all people born from [birth year] through 1965 and any individual with known risk factors for hepatitis C.  Healthy men should no longer receive prostate-specific antigen (PSA) blood tests as part of routine cancer screening. Talk to your health care provider  about prostate cancer screening.  Testicular cancer screening is not recommended for adolescents or adult males who have no symptoms. Screening includes self-exam, a health care provider exam, and other screening tests. Consult with your health care provider about any symptoms you have or any concerns you have about testicular cancer.  Practice safe sex. Use condoms and avoid high-risk sexual practices to reduce the spread of sexually transmitted infections (STIs).  You should be screened for STIs, including gonorrhea and chlamydia if:  You are sexually active and are younger than 24 years.  You are older than 24 years, and your health care provider tells you that you are at risk for this type of infection.  Your sexual activity has changed since you were last screened, and you are at an increased risk for chlamydia or gonorrhea. Ask your health care provider if you are at risk.  If you are at risk of being infected with HIV, it is recommended that you take a prescription medicine daily to prevent HIV infection. This is called pre-exposure prophylaxis (PrEP). You are considered at risk if:  You are a man who has sex with other men (MSM).  You are a heterosexual man who is sexually active with multiple partners.  You take drugs by injection.  You are sexually active with a partner who has HIV.  Talk with your health care provider about whether you are at high risk of being infected with HIV. If you choose to begin PrEP, you should first be tested for HIV. You should then be tested every 3 months for as long as you are taking PrEP.  Use sunscreen. Apply sunscreen liberally and repeatedly throughout the day. You should seek shade when your shadow is shorter than you. Protect yourself by wearing long sleeves, pants, a wide-brimmed hat, and sunglasses year round whenever you are outdoors.  Tell your health care provider of new moles or changes in moles, especially if there is a change in shape  or color. Also, tell your health care provider if a mole is larger than the size of a pencil eraser.  A one-time screening for abdominal aortic aneurysm (AAA) and surgical repair of large AAAs by ultrasound is recommended for men aged 58-75 years who are current or former smokers.  Stay current with your vaccines (immunizations). This information is not intended to replace advice given to you by your health care provider. Make  sure you discuss any questions you have with your health care provider. Document Released: 05/26/2008 Document Revised: 12/19/2014 Document Reviewed: 09/01/2015 Elsevier Interactive Patient Education  2017 [employer].

## 2017-01-03 NOTE — Progress Notes (Signed)
Patient ID: Joseph Larsen, male   DOB: 08/25/1946, 70 y.o.   MRN: [id number]      Subjective:   Joseph Larsen is a 70 y.o. male who presents for a subsequent Medicare Annual Wellness Visit.  Joseph Larsen reports that he feels that he is in good health.  He does state that he has been feeling more tired lately and asks about testosterone level that he thought was checked about 1 to 2 years ago.  I could not find an result for testosterone in his labs from as far back as 2014.  Social History: Born/Raised: [place], [place].  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 allopurinol (ZYLOPRIM) 300 MG tablet Take 300 mg by mouth daily.  . amLODipine (NORVASC) 10 MG tablet Take 5 mg by mouth daily.  . aspirin 81 MG tablet Take 81 mg by mouth daily.  . atenolol (TENORMIN) 100 MG tablet Take 50 mg by mouth daily.  . buPROPion (WELLBUTRIN SR) 200 MG 12 hr tablet Take 200 mg by mouth 2 (two) times daily.  . cholecalciferol (VITAMIN D) 1000 UNITS tablet Take 1,000 Units by mouth daily.  . diazepam (VALIUM) 10 MG tablet Take 1 tablet (10 mg total) by mouth every 8 (eight) hours as needed for anxiety. (Patient taking differently: Take 10 mg by mouth every 12 (twelve) hours as needed for anxiety. )  . flunisolide (NASALIDE) 0.025 % SOLN Inhale 2 sprays into the lungs as needed.  . hydrochlorothiazide (HYDRODIURIL) 25 MG tablet Take 12.5 mg by mouth daily.  . Omega-3 Fatty Acids (FISH OIL) 1000 MG CAPS Take 4 capsules by mouth daily.   . omeprazole (PRILOSEC) 20 MG capsule Take 40 mg by mouth 2 (two) times daily before a meal.   . QUEtiapine (SEROQUEL) 50 MG tablet Take 25 mg by mouth at bedtime.  .  valsartan (DIOVAN) 320 MG tablet Take 160 mg by mouth daily.   No facility-administered encounter medications on file as of 01/03/2017.     Allergies (verified) Enalapril maleate; Penicillins; Hytrin [terazosin]; and Zestril [lisinopril]   History: Past Medical History:  Diagnosis Date  . Abnormal ECG    a. 08/2012 Abnl ETT with ST changes in recovery;  b. 08/2012 Ex MV, ex time 10:00, EF 66%, no ischemia/infarct.  . Allergic rhinitis   . Anxiety   . Cataract   . Depression   . Diverticular disease   . Dyspnea on exertion   . ETOH abuse   . GERD (gastroesophageal reflux disease)   . Gout   . Hemorrhoids   . Hiatal hernia   . HTN (hypertension)   . Low testosterone   . MACROCYTIC ANEMIA   . NONSPEC ELEVATION OF LEVELS OF TRANSAMINASE/LDH   . Nonspecific abnormal finding in stool contents   . Palpitations   . PTSD (post-traumatic stress disorder)   . PTSD (post-traumatic stress disorder)    [place] Vet - [employer]   Past Surgical History:  Procedure Laterality Date  . CHOLECYSTECTOMY  2004  . KNEE SURGERY Bilateral    Family History  Problem Relation Age of Onset  . Heart disease Mother     "died of chf"  . Heart disease Father     "  Patient ID: Joseph Larsen, male   DOB: [birth year], 71 y.o.   MRN: [id number]      Subjective:   Joseph Larsen is a 71 y.o. male who presents for a subsequent Medicare Annual Wellness Visit.  Joseph Larsen reports that he feels that he is in good health.  He does state that he has been feeling more tired lately and asks about testosterone level that he thought was checked about 1 to 2 years ago.  I could not find an result for testosterone in his labs from as far back as 2014.  Social History: Born/Raised: [place], [place].  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 allopurinol (ZYLOPRIM) 300 MG tablet Take 300 mg by mouth daily.  Marland Kitchen amLODipine (NORVASC) 10 MG tablet Take 5 mg by mouth daily.  Marland Kitchen aspirin 81 MG tablet Take 81 mg by mouth daily.  Marland Kitchen atenolol (TENORMIN) 100 MG tablet Take 50 mg by mouth daily.  Marland Kitchen buPROPion (WELLBUTRIN SR) 200 MG 12 hr tablet Take 200 mg by mouth 2 (two) times daily.  . cholecalciferol (VITAMIN D) 1000 UNITS tablet Take 1,000 Units by mouth daily.  . diazepam (VALIUM) 10 MG tablet Take 1 tablet (10 mg total) by mouth every 8 (eight) hours as needed for anxiety. (Patient taking differently: Take 10 mg by mouth every 12 (twelve) hours as needed for anxiety. )  . flunisolide (NASALIDE) 0.025 % SOLN Inhale 2 sprays into the lungs as needed.  . hydrochlorothiazide (HYDRODIURIL) 25 MG tablet Take 12.5 mg by mouth daily.  . Omega-3 Fatty Acids (FISH OIL) 1000 MG CAPS Take 4 capsules by mouth daily.   Marland Kitchen omeprazole (PRILOSEC) 20 MG capsule Take 40 mg by mouth 2 (two) times daily before a meal.   . QUEtiapine (SEROQUEL) 50 MG tablet Take 25 mg by mouth at bedtime.  .  valsartan (DIOVAN) 320 MG tablet Take 160 mg by mouth daily.   No facility-administered encounter medications on file as of 01/03/2017.     Allergies (verified) Enalapril maleate; Penicillins; Hytrin [terazosin]; and Zestril [lisinopril]   History: Past Medical History:  Diagnosis Date  . Abnormal ECG    a. 08/2012 Abnl ETT with ST changes in recovery;  b. 08/2012 Ex MV, ex time 10:00, EF 66%, no ischemia/infarct.  . Allergic rhinitis   . Anxiety   . Cataract   . Depression   . Diverticular disease   . Dyspnea on exertion   . ETOH abuse   . GERD (gastroesophageal reflux disease)   . Gout   . Hemorrhoids   . Hiatal hernia   . HTN (hypertension)   . Low testosterone   . MACROCYTIC ANEMIA   . NONSPEC ELEVATION OF LEVELS OF TRANSAMINASE/LDH   . Nonspecific abnormal finding in stool contents   . Palpitations   . PTSD (post-traumatic stress disorder)   . PTSD (post-traumatic stress disorder)    [name] - [employer]   Past Surgical History:  Procedure Laterality Date  . CHOLECYSTECTOMY  2004  . KNEE SURGERY Bilateral    Family History  Problem Relation Age of Onset  . Heart disease Mother     "died of chf"  . Heart disease Father     "  Patient ID: Joseph Larsen, male   DOB: 08/25/1946, 70 y.o.   MRN: [id number]      Subjective:   Joseph Larsen is a 70 y.o. male who presents for a subsequent Medicare Annual Wellness Visit.  Joseph Larsen reports that he feels that he is in good health.  He does state that he has been feeling more tired lately and asks about testosterone level that he thought was checked about 1 to 2 years ago.  I could not find an result for testosterone in his labs from as far back as 2014.  Social History: Born/Raised: [place], [place].  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 allopurinol (ZYLOPRIM) 300 MG tablet Take 300 mg by mouth daily.  . amLODipine (NORVASC) 10 MG tablet Take 5 mg by mouth daily.  . aspirin 81 MG tablet Take 81 mg by mouth daily.  . atenolol (TENORMIN) 100 MG tablet Take 50 mg by mouth daily.  . buPROPion (WELLBUTRIN SR) 200 MG 12 hr tablet Take 200 mg by mouth 2 (two) times daily.  . cholecalciferol (VITAMIN D) 1000 UNITS tablet Take 1,000 Units by mouth daily.  . diazepam (VALIUM) 10 MG tablet Take 1 tablet (10 mg total) by mouth every 8 (eight) hours as needed for anxiety. (Patient taking differently: Take 10 mg by mouth every 12 (twelve) hours as needed for anxiety. )  . flunisolide (NASALIDE) 0.025 % SOLN Inhale 2 sprays into the lungs as needed.  . hydrochlorothiazide (HYDRODIURIL) 25 MG tablet Take 12.5 mg by mouth daily.  . Omega-3 Fatty Acids (FISH OIL) 1000 MG CAPS Take 4 capsules by mouth daily.   . omeprazole (PRILOSEC) 20 MG capsule Take 40 mg by mouth 2 (two) times daily before a meal.   . QUEtiapine (SEROQUEL) 50 MG tablet Take 25 mg by mouth at bedtime.  .  valsartan (DIOVAN) 320 MG tablet Take 160 mg by mouth daily.   No facility-administered encounter medications on file as of 01/03/2017.     Allergies (verified) Enalapril maleate; Penicillins; Hytrin [terazosin]; and Zestril [lisinopril]   History: Past Medical History:  Diagnosis Date  . Abnormal ECG    a. 08/2012 Abnl ETT with ST changes in recovery;  b. 08/2012 Ex MV, ex time 10:00, EF 66%, no ischemia/infarct.  . Allergic rhinitis   . Anxiety   . Cataract   . Depression   . Diverticular disease   . Dyspnea on exertion   . ETOH abuse   . GERD (gastroesophageal reflux disease)   . Gout   . Hemorrhoids   . Hiatal hernia   . HTN (hypertension)   . Low testosterone   . MACROCYTIC ANEMIA   . NONSPEC ELEVATION OF LEVELS OF TRANSAMINASE/LDH   . Nonspecific abnormal finding in stool contents   . Palpitations   . PTSD (post-traumatic stress disorder)   . PTSD (post-traumatic stress disorder)    [place] [employer] - [employer]   Past Surgical History:  Procedure Laterality Date  . CHOLECYSTECTOMY  2004  . KNEE SURGERY Bilateral    Family History  Problem Relation Age of Onset  . Heart disease Mother     "died of chf"  . Heart disease Father     "

## 2017-01-04 LAB — CMP14+EGFR
A/G RATIO: 1.9 (ref 1.2–2.2)
ALBUMIN: 4.2 g/dL (ref 3.6–4.8)
ALT: 31 IU/L (ref 0–44)
AST: 27 IU/L (ref 0–40)
Alkaline Phosphatase: 48 IU/L (ref 39–117)
BUN / CREAT RATIO: 12 (ref 10–24)
BUN: 10 mg/dL (ref 8–27)
Bilirubin Total: 0.5 mg/dL (ref 0.0–1.2)
CALCIUM: 9.1 mg/dL (ref 8.6–10.2)
CO2: 26 mmol/L (ref 18–29)
Chloride: 98 mmol/L (ref 96–106)
Creatinine, Ser: 0.84 mg/dL (ref 0.76–1.27)
GFR, EST AFRICAN AMERICAN: 103 mL/min/{1.73_m2} (ref >59)
GFR, EST NON AFRICAN AMERICAN: 89 mL/min/{1.73_m2} (ref >59)
GLOBULIN, TOTAL: 2.2 g/dL (ref 1.5–4.5)
Glucose: 79 mg/dL (ref 65–99)
POTASSIUM: 4.1 mmol/L (ref 3.5–5.2)
SODIUM: 140 mmol/L (ref 134–144)
TOTAL PROTEIN: 6.4 g/dL (ref 6.0–8.5)

## 2017-01-04 LAB — ANEMIA PROFILE B
BASOS: 1 %
Basophils Absolute: 0.1 10*3/uL (ref 0.0–0.2)
EOS (ABSOLUTE): 0.1 10*3/uL (ref 0.0–0.4)
EOS: 1 %
FERRITIN: 109 ng/mL (ref 30–400)
Folate: 12.4 ng/mL (ref >3.0)
HEMATOCRIT: 48.3 % (ref 37.5–51.0)
HEMOGLOBIN: 16.9 g/dL (ref 13.0–17.7)
IMMATURE GRANS (ABS): 0 10*3/uL (ref 0.0–0.1)
IRON SATURATION: 65 % — AB (ref 15–55)
Immature Granulocytes: 0 %
Iron: 203 ug/dL — ABNORMAL HIGH (ref 38–169)
LYMPHS: 38 %
Lymphocytes Absolute: 2.3 10*3/uL (ref 0.7–3.1)
MCH: 34.8 pg — ABNORMAL HIGH (ref 26.6–33.0)
MCHC: 35 g/dL (ref 31.5–35.7)
MCV: 99 fL — ABNORMAL HIGH (ref 79–97)
MONOS ABS: 0.6 10*3/uL (ref 0.1–0.9)
Monocytes: 10 %
Neutrophils Absolute: 3 10*3/uL (ref 1.4–7.0)
Neutrophils: 50 %
Platelets: 148 10*3/uL — ABNORMAL LOW (ref 150–379)
RBC: 4.86 x10E6/uL (ref 4.14–5.80)
RDW: 13 % (ref 12.3–15.4)
RETIC CT PCT: 0.9 % (ref 0.6–2.6)
Total Iron Binding Capacity: 310 ug/dL (ref 250–450)
UIBC: 107 ug/dL — ABNORMAL LOW (ref 111–343)
Vitamin B-12: 223 pg/mL — ABNORMAL LOW (ref 232–1245)
WBC: 6 10*3/uL (ref 3.4–10.8)

## 2017-01-04 LAB — THYROID PANEL WITH TSH
FREE THYROXINE INDEX: 1.5 (ref 1.2–4.9)
T3 UPTAKE RATIO: 27 % (ref 24–39)
T4, Total: 5.4 ug/dL (ref 4.5–12.0)
TSH: 1.53 u[IU]/mL (ref 0.450–4.500)

## 2017-01-04 LAB — LIPID PANEL
CHOL/HDL RATIO: 3.2 ratio (ref 0.0–5.0)
Cholesterol, Total: 119 mg/dL (ref 100–199)
HDL: 37 mg/dL — ABNORMAL LOW (ref >39)
LDL Calculated: 67 mg/dL (ref 0–99)
Triglycerides: 74 mg/dL (ref 0–149)
VLDL Cholesterol Cal: 15 mg/dL (ref 5–40)

## 2017-01-04 LAB — TESTOSTERONE,FREE AND TOTAL
Testosterone, Free: 5.2 pg/mL — ABNORMAL LOW (ref 6.6–18.1)
Testosterone: 388 ng/dL (ref 264–916)

## 2017-01-04 LAB — LDL CHOLESTEROL, DIRECT: LDL Direct: 74 mg/dL (ref 0–99)

## 2017-01-05 ENCOUNTER — Other Ambulatory Visit: Payer: Self-pay | Admitting: Pharmacist

## 2017-01-05 MED ORDER — CYANOCOBALAMIN 1000 MCG/ML IJ SOLN: 1000.0000 ug | INTRAMUSCULAR | 0 refills | Status: DC

## 2017-01-06 DIAGNOSIS — L570 Actinic keratosis: Secondary | ICD-10-CM | POA: Diagnosis not present

## 2017-01-06 DIAGNOSIS — L821 Other seborrheic keratosis: Secondary | ICD-10-CM | POA: Diagnosis not present

## 2017-01-17 DIAGNOSIS — L539 Erythematous condition, unspecified: Secondary | ICD-10-CM | POA: Diagnosis not present

## 2017-01-17 DIAGNOSIS — K2940 Chronic atrophic gastritis without bleeding: Secondary | ICD-10-CM | POA: Diagnosis not present

## 2017-01-17 DIAGNOSIS — I10 Essential (primary) hypertension: Secondary | ICD-10-CM | POA: Diagnosis not present

## 2017-01-17 DIAGNOSIS — K219 Gastro-esophageal reflux disease without esophagitis: Secondary | ICD-10-CM | POA: Diagnosis not present

## 2017-01-17 DIAGNOSIS — K449 Diaphragmatic hernia without obstruction or gangrene: Secondary | ICD-10-CM | POA: Diagnosis not present

## 2017-01-17 DIAGNOSIS — K3189 Other diseases of stomach and duodenum: Secondary | ICD-10-CM | POA: Diagnosis not present

## 2017-01-17 DIAGNOSIS — Z880 Allergy status to penicillin: Secondary | ICD-10-CM | POA: Diagnosis not present

## 2017-04-04 ENCOUNTER — Encounter: Payer: Self-pay | Admitting: Gastroenterology

## 2017-04-04 DIAGNOSIS — K319 Disease of stomach and duodenum, unspecified: Secondary | ICD-10-CM | POA: Diagnosis not present

## 2017-04-04 DIAGNOSIS — K3189 Other diseases of stomach and duodenum: Secondary | ICD-10-CM | POA: Diagnosis not present

## 2017-04-04 DIAGNOSIS — I10 Essential (primary) hypertension: Secondary | ICD-10-CM | POA: Diagnosis not present

## 2017-04-04 DIAGNOSIS — K449 Diaphragmatic hernia without obstruction or gangrene: Secondary | ICD-10-CM | POA: Diagnosis not present

## 2017-04-04 DIAGNOSIS — K219 Gastro-esophageal reflux disease without esophagitis: Secondary | ICD-10-CM | POA: Diagnosis not present

## 2017-04-04 DIAGNOSIS — Q403 Congenital malformation of stomach, unspecified: Secondary | ICD-10-CM | POA: Diagnosis not present

## 2017-04-04 DIAGNOSIS — Z7982 Long term (current) use of aspirin: Secondary | ICD-10-CM | POA: Diagnosis not present

## 2017-04-13 ENCOUNTER — Encounter: Payer: Self-pay | Admitting: Family Medicine

## 2017-05-01 ENCOUNTER — Encounter: Payer: Self-pay | Admitting: Family Medicine

## 2017-05-01 ENCOUNTER — Ambulatory Visit (INDEPENDENT_AMBULATORY_CARE_PROVIDER_SITE_OTHER): Payer: Medicare Other | Admitting: Family Medicine

## 2017-05-01 VITALS — BP 143/90 | HR 61 | Temp 97.1°F | Ht 70.0 in | Wt 189.8 lb

## 2017-05-01 DIAGNOSIS — E663 Overweight: Secondary | ICD-10-CM

## 2017-05-01 DIAGNOSIS — E538 Deficiency of other specified B group vitamins: Secondary | ICD-10-CM | POA: Diagnosis not present

## 2017-05-01 DIAGNOSIS — I10 Essential (primary) hypertension: Secondary | ICD-10-CM

## 2017-05-01 NOTE — Progress Notes (Signed)
   HPI  Patient presents today for an annual physical exam.  Overweight Patient watches his diet moderately, he exercises at the local physical therapy office 3 times a week with cardio and weight lifting.  He has started taking B-12 supplements intermittently.  Hypertension Blood pressure at home usually 437D to 578X systolic over 78E and 78S diastolic. Good medication compliance. No chest pain, dyspnea, palpitations, leg edema, headaches.  PMH: Smoking status noted ROS: Per HPI  Objective: BP (!) 143/90   Pulse 61   Temp 97.1 F (36.2 C) (Oral)   Ht 5\' 10"  (1.778 m)   Wt 189 lb 12.8 oz (86.1 kg)   BMI 27.23 kg/m  Gen: NAD, alert, cooperative with exam HEENT: NCAT, oropharynx clear, TMs normal bilaterally CV: RRR, good S1/S2, no murmur Resp: CTABL, no wheezes, non-labored Abd: SNTND, BS present, no guarding or organomegaly Ext: No edema, warm Neuro: Alert and oriented, No gross deficits  Assessment and plan:  # Hypertension Reasonably well controlled on current medications, offered consolidating medications considering that he is on several half dose occasions, he would like to continue current doses.  # B-12 deficiency Patient taking oral supplementation, discussed oral versus IM, continue oral for now. No anemia, no need to recheck yet Patient gets labs at the [place] as well, he will bring a copy when they're available  # Overweight Patient's weight is relatively healthy for his age Continue watching diet and regular exercise Congratulated on good exercise routine    Laroy Apple, MD [facility] 05/01/2017, 10:25 AM

## 2017-05-03 DIAGNOSIS — H40033 Anatomical narrow angle, bilateral: Secondary | ICD-10-CM | POA: Diagnosis not present

## 2017-05-03 DIAGNOSIS — H2513 Age-related nuclear cataract, bilateral: Secondary | ICD-10-CM | POA: Diagnosis not present

## 2017-09-04 ENCOUNTER — Encounter: Payer: Self-pay | Admitting: Family Medicine

## 2017-09-04 ENCOUNTER — Ambulatory Visit (INDEPENDENT_AMBULATORY_CARE_PROVIDER_SITE_OTHER): Payer: Self-pay | Admitting: Family Medicine

## 2017-09-04 VITALS — BP 139/88 | HR 61 | Temp 97.7°F | Ht 71.0 in | Wt 193.0 lb

## 2017-09-04 DIAGNOSIS — Z024 Encounter for examination for driving license: Secondary | ICD-10-CM

## 2017-09-04 DIAGNOSIS — I10 Essential (primary) hypertension: Secondary | ICD-10-CM

## 2017-09-04 NOTE — Progress Notes (Signed)
Subjective:  Patient ID: Joseph Larsen, male    DOB: 1946/01/05  Age: 71 y.o. MRN: [id number]  CC: Private DOT Physical   HPI Joseph Larsen presents for DOT clearance exam.  Depression screen [facility] 2/9 09/04/2017 05/01/2017 01/03/2017  Decreased Interest 0 0 0  Down, Depressed, Hopeless 0 0 0  PHQ - 2 Score 0 0 0    History Joseph Larsen has a past medical history of Abnormal ECG; Allergic rhinitis; Anxiety; Cataract; Depression; Diverticular disease; Dyspnea on exertion; ETOH abuse; GERD (gastroesophageal reflux disease); Gout; Hemorrhoids; Hiatal hernia; HTN (hypertension); Low testosterone; MACROCYTIC ANEMIA; NONSPEC ELEVATION OF LEVELS OF TRANSAMINASE/LDH; Nonspecific abnormal finding in stool contents; Palpitations; PTSD (post-traumatic stress disorder); and PTSD (post-traumatic stress disorder).   He has a past surgical history that includes Knee surgery (Bilateral) and Cholecystectomy (2004).   His family history includes Heart disease in his father and mother; Heart murmur in his sister; Hypertension in his brother, sister, sister, sister, and sister; Liver disease in his brother.He reports that he quit smoking about 35 years ago. His smoking use included Cigarettes. He started smoking about 52 years ago. He smoked 3.00 packs per day. He has never used smokeless tobacco. He reports that he drinks about 8.4 oz of alcohol per week . He reports that he does not use drugs.    ROS Review of Systems  Constitutional: Negative for chills, diaphoresis, fever and unexpected weight change.  HENT: Negative for congestion, hearing loss, rhinorrhea and sore throat.   Eyes: Negative for visual disturbance.  Respiratory: Negative for cough and shortness of breath.   Cardiovascular: Negative for chest pain.  Gastrointestinal: Negative for abdominal pain, constipation and diarrhea.  Genitourinary: Negative for dysuria and flank pain.  Musculoskeletal: Negative for arthralgias and joint swelling.  Skin:  Negative for rash.  Neurological: Negative for dizziness and headaches.  Psychiatric/Behavioral: Negative for dysphoric mood and sleep disturbance.    Objective:  BP 139/88   Pulse 61   Temp 97.7 F (36.5 C) (Oral)   Ht 5\' 11"  (1.803 m)   Wt 193 lb (87.5 kg)   BMI 26.92 kg/m   BP Readings from Last 3 Encounters:  09/04/17 139/88  05/01/17 (!) 143/90  01/03/17 136/82    Wt Readings from Last 3 Encounters:  09/04/17 193 lb (87.5 kg)  05/01/17 189 lb 12.8 oz (86.1 kg)  01/03/17 194 lb (88 kg)     Physical Exam  Constitutional: He is oriented to person, place, and time. He appears well-developed and well-nourished. No distress.  HENT:  Head: Normocephalic and atraumatic.  Right Ear: External ear normal.  Left Ear: External ear normal.  Nose: Nose normal.  Mouth/Throat: Oropharynx is clear and moist.  Eyes: Pupils are equal, round, and reactive to light. Conjunctivae and EOM are normal.  Neck: Normal range of motion. Neck supple. No thyromegaly present.  Cardiovascular: Normal rate, regular rhythm and normal heart sounds.   No murmur heard. Pulmonary/Chest: Effort normal and breath sounds normal. No respiratory distress. He has no wheezes. He has no rales.  Abdominal: Soft. Bowel sounds are normal. He exhibits no distension. There is no tenderness.  Lymphadenopathy:    He has no cervical adenopathy.  Neurological: He is alert and oriented to person, place, and time. He has normal reflexes.  Skin: Skin is warm and dry.  Psychiatric: He has a normal mood and affect. His behavior is normal. Judgment and thought content normal.      Assessment & Plan:  Joseph Larsen was seen today for private dot physical.  Diagnoses and all orders for this visit:  Encounter for Department of Transportation (DOT) examination for driving license renewal  Essential hypertension       I am having Joseph Larsen maintain his flunisolide, aspirin, amLODipine, hydrochlorothiazide, QUEtiapine,  valsartan, atenolol, allopurinol, buPROPion, omeprazole, diazepam, cholecalciferol, Fish Oil, and cyanocobalamin.  Allergies as of 09/04/2017      Reactions   Enalapril Maleate    REACTION: rash   Penicillins    REACTION: rash, itching   Hytrin [terazosin] Rash   Rash when in sun   Zestril [lisinopril]       Medication List       Accurate as of 09/04/17  3:01 PM. Always use your most recent med list.          allopurinol 300 MG tablet Commonly known as:  ZYLOPRIM Take 300 mg by mouth daily.   amLODipine 10 MG tablet Commonly known as:  NORVASC Take 5 mg by mouth daily.   aspirin 81 MG tablet Take 81 mg by mouth daily.   atenolol 100 MG tablet Commonly known as:  TENORMIN Take 50 mg by mouth daily.   buPROPion 200 MG 12 hr tablet Commonly known as:  WELLBUTRIN SR Take 200 mg by mouth 2 (two) times daily.   cholecalciferol 1000 units tablet Commonly known as:  VITAMIN D Take 1,000 Units by mouth daily.   cyanocobalamin 1000 MCG/ML injection Commonly known as:  (VITAMIN B-12) Inject 1 mL (1,000 mcg total) into the muscle every 30 (thirty) days.   diazepam 10 MG tablet Commonly known as:  VALIUM Take 1 tablet (10 mg total) by mouth every 8 (eight) hours as needed for anxiety.   Fish Oil 1000 MG Caps Take 4 capsules by mouth daily.   flunisolide 25 MCG/ACT (0.025%) Soln Commonly known as:  NASALIDE Inhale 2 sprays into the lungs as needed.   hydrochlorothiazide 25 MG tablet Commonly known as:  HYDRODIURIL Take 12.5 mg by mouth daily.   omeprazole 20 MG capsule Commonly known as:  PRILOSEC Take 40 mg by mouth 2 (two) times daily before a meal.   QUEtiapine 50 MG tablet Commonly known as:  SEROQUEL Take 25 mg by mouth at bedtime.   valsartan 320 MG tablet Commonly known as:  DIOVAN Take 160 mg by mouth daily.        Follow-up: Return in about 1 year (around 09/04/2018).  Claretta Fraise, M.D.

## 2017-09-13 ENCOUNTER — Ambulatory Visit (INDEPENDENT_AMBULATORY_CARE_PROVIDER_SITE_OTHER): Payer: Medicare Other | Admitting: *Deleted

## 2017-09-13 DIAGNOSIS — E538 Deficiency of other specified B group vitamins: Secondary | ICD-10-CM | POA: Diagnosis not present

## 2017-09-13 MED ORDER — CYANOCOBALAMIN 1000 MCG/ML IJ SOLN
1000.0000 ug | INTRAMUSCULAR | Status: DC
  Administered 2017-09-13 – 2018-02-13 (×3): 1000 ug via INTRAMUSCULAR

## 2017-09-13 NOTE — Progress Notes (Signed)
Pt given Cyanocobalamin inj Tolerated well 

## 2018-01-05 DIAGNOSIS — D225 Melanocytic nevi of trunk: Secondary | ICD-10-CM | POA: Diagnosis not present

## 2018-01-05 DIAGNOSIS — L821 Other seborrheic keratosis: Secondary | ICD-10-CM | POA: Diagnosis not present

## 2018-01-05 DIAGNOSIS — L570 Actinic keratosis: Secondary | ICD-10-CM | POA: Diagnosis not present

## 2018-01-05 DIAGNOSIS — L814 Other melanin hyperpigmentation: Secondary | ICD-10-CM | POA: Diagnosis not present

## 2018-01-09 ENCOUNTER — Ambulatory Visit (INDEPENDENT_AMBULATORY_CARE_PROVIDER_SITE_OTHER): Payer: Medicare Other | Admitting: *Deleted

## 2018-01-09 ENCOUNTER — Encounter: Payer: Self-pay | Admitting: *Deleted

## 2018-01-09 VITALS — BP 137/78 | HR 55 | Ht 70.0 in | Wt 197.0 lb

## 2018-01-09 DIAGNOSIS — E538 Deficiency of other specified B group vitamins: Secondary | ICD-10-CM

## 2018-01-09 DIAGNOSIS — Z0000 Encounter for general adult medical examination without abnormal findings: Secondary | ICD-10-CM | POA: Diagnosis not present

## 2018-01-09 NOTE — Patient Instructions (Signed)
  Joseph Larsen , Thank you for taking time to come for your Medicare Wellness Visit. I appreciate your ongoing commitment to your health goals. Please review the following plan we discussed and let me know if I can assist you in the future.   These are the goals we discussed: Continue to stay active. Aim for 150 minutes a week.   This is a list of the screening recommended for you and due dates:  Health Maintenance  Topic Date Due  . Tetanus Vaccine  04/12/2019  . Colon Cancer Screening  09/07/2020  . Flu Shot  Completed  .  Hepatitis C: One time screening is recommended by Center for Disease Control  (CDC) for  adults born from [birth year] through 1964.   Completed  . Pneumonia vaccines  Completed

## 2018-01-09 NOTE — Progress Notes (Signed)
Subjective:   Joseph Larsen is a 72 y.o. male who presents for an Initial Medicare Annual Wellness Visit.  Review of Systems  Health is about the same as last year.   Cardiac Risk Factors include: advanced age (>36men, >59 women);male gender;hypertension    Objective:    Today's Vitals   01/09/18 1014  BP: 137/78  Pulse: (!) 55  Weight: 197 lb (89.4 kg)  Height: 5\' 10"  (1.778 m)  PainSc: 3    Body mass index is 28.27 kg/m.  Advanced Directives 01/09/2018 01/03/2017 06/24/2016 05/19/2016 01/19/2016  Does Patient Have a Medical Advance Directive? No Yes No Yes Yes;No  Type of Advance Directive - Healthcare Power of [place];Living will - [place] -  Does patient want to make changes to medical advance directive? - No - Patient declined - No - Patient declined -  Copy of [place] in Chart? - No - copy requested - No - copy requested -  Would patient like information on creating a medical advance directive? No - Patient declined - No - patient declined information - Yes - Educational materials given    Current Medications (verified) Outpatient Encounter Medications as of 01/09/2018  Medication Sig  . allopurinol (ZYLOPRIM) 300 MG tablet Take 300 mg by mouth daily.  Marland Kitchen amLODipine (NORVASC) 10 MG tablet Take 5 mg by mouth daily.  Marland Kitchen aspirin 81 MG tablet Take 81 mg by mouth daily.  Marland Kitchen atenolol (TENORMIN) 100 MG tablet Take 50 mg by mouth daily.  Marland Kitchen buPROPion (WELLBUTRIN SR) 200 MG 12 hr tablet Take 200 mg by mouth 2 (two) times daily.  . cholecalciferol (VITAMIN D) 1000 UNITS tablet Take 1,000 Units by mouth daily.  . cyanocobalamin (,VITAMIN B-12,) 1000 MCG/ML injection Inject 1 mL (1,000 mcg total) into the muscle every 30 (thirty) days.  . diazepam (VALIUM) 10 MG tablet Take 1 tablet (10 mg total) by mouth every 8 (eight) hours as needed for anxiety. (Patient taking differently: Take 10 mg by mouth every 12 (twelve) hours as needed for anxiety. )  .  flunisolide (NASALIDE) 0.025 % SOLN Inhale 2 sprays into the lungs as needed.  . hydrochlorothiazide (HYDRODIURIL) 25 MG tablet Take 12.5 mg by mouth daily.  . Misc Natural Products (OSTEO BI-FLEX/5-LOXIN ADVANCED PO) Take 2 tablets by mouth.  . Omega-3 Fatty Acids (FISH OIL) 1000 MG CAPS Take 4 capsules by mouth daily.   Marland Kitchen omeprazole (PRILOSEC) 20 MG capsule Take 40 mg by mouth 2 (two) times daily before a meal.   . QUEtiapine (SEROQUEL) 50 MG tablet Take 25 mg by mouth at bedtime.  . valsartan (DIOVAN) 320 MG tablet Take 160 mg by mouth daily.   Facility-Administered Encounter Medications as of 01/09/2018  Medication  . cyanocobalamin ((VITAMIN B-12)) injection 1,000 mcg    Allergies (verified) Enalapril maleate; Penicillins; Hytrin [terazosin]; and Zestril [lisinopril]   History: Past Medical History:  Diagnosis Date  . Abnormal ECG    a. 08/2012 Abnl ETT with ST changes in recovery;  b. 08/2012 Ex MV, ex time 10:00, EF 66%, no ischemia/infarct.  . Allergic rhinitis   . Anxiety   . Cataract   . Depression   . Diverticular disease   . Dyspnea on exertion   . ETOH abuse   . GERD (gastroesophageal reflux disease)   . Gout   . Hemorrhoids   . Hiatal hernia   . HTN (hypertension)   . Low testosterone   . MACROCYTIC ANEMIA   .  NONSPEC ELEVATION OF LEVELS OF TRANSAMINASE/LDH   . Nonspecific abnormal finding in stool contents   . Palpitations   . PTSD (post-traumatic stress disorder)   . PTSD (post-traumatic stress disorder)    [name] - [employer]   Past Surgical History:  Procedure Laterality Date  . CHOLECYSTECTOMY  2004  . KNEE SURGERY Bilateral    Family History  Problem Relation Age of Onset  . Heart disease Mother        "died of chf"  . Heart disease Father        "died of old age"  . Hypertension Sister   . Liver disease Brother   . Hypertension Brother   . Hypertension Sister   . Hypertension Sister   . Hypertension Sister   . Heart murmur Sister   . Colon  cancer Neg Hx    Social History   Socioeconomic History  . Marital status: Married    Spouse name: Suanne Marker  . Number of children: 1  . Years of education: Not on file  . Highest education level: Not on file  Social Needs  . Financial resource strain: Not hard at all  . Food insecurity - worry: Never true  . Food insecurity - inability: Never true  . Transportation needs - medical: Not on file  . Transportation needs - non-medical: No  Occupational History  . Occupation: disabled    Employer: RETIRED    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  Tobacco Use  . Smoking status: Former Smoker    Packs/day: 3.00    Types: Cigarettes    Start date: 02/09/1965    Last attempt to quit: 05/12/1982    Years since quitting: 35.6  . Smokeless tobacco: Never Used  Substance and Sexual Activity  . Alcohol use: Yes    Alcohol/week: 8.4 oz    Types: 14 Cans of beer per week    Comment: drinks 2-3 cans of beer/day    . Drug use: No    Comment: 1 cup of decaf coffee/day.  Marland Kitchen Sexual activity: Yes  Other Topics Concern  . Not on file  Social History Narrative   Lives with wife in [place] in a one story home.  Exercises @ gym 3x/wk - stationary bike and light weight training.   Tobacco Counseling No tobacco use  Clinical Intake:     Pain : 0-10 Pain Score: 3  Pain Type: Chronic pain Pain Location: Shoulder Pain Orientation: Right Pain Radiating Towards: down right arm with some movements Pain Descriptors / Indicators: Aching Pain Onset: More than a month ago Pain Frequency: Intermittent Effect of Pain on Daily Activities: Limits some ROM     Nutritional Status: BMI of 19-24  Normal Diabetes: No  How often do you need to have someone help you when you read instructions, pamphlets, or other written materials from your doctor or pharmacy?: 1 - Never What is the last grade level you completed in school?: 8th     Information entered by :: Chong Sicilian, RN  Activities of  Daily Living In your present state of health, do you have any difficulty performing the following activities: 01/09/2018  Hearing? N  Vision? N  Comment [facility] with Anthony Sar, OD  Difficulty concentrating or making decisions? Y  Comment Has noticed some decrease in short term memory over time. Progressing slowly. May forget where he sits something.   Walking or climbing stairs? N  Dressing or bathing? N  Doing errands, shopping?  N  Preparing Food and eating ? N  Using the Toilet? N  In the past six months, have you accidently leaked urine? N  Do you have problems with loss of bowel control? N  Managing your Medications? N  Managing your Finances? N  Housekeeping or managing your Housekeeping? N  Some recent data might be hidden     Immunizations and Health Maintenance Immunization History  Administered Date(s) Administered  . Influenza Split 11/18/2011, 11/11/2012  . Influenza,inj,Quad PF,6+ Mos 10/02/2013  . Influenza-Unspecified 10/13/2015, 09/09/2016, 09/26/2017  . Pneumococcal Conjugate-13 03/03/2014  . Pneumococcal Polysaccharide-23 06/01/2012  . Tdap 04/11/2009  . Zoster 01/13/2012   There are no preventive care reminders to display for this patient.  Patient Care Team: Timmothy Euler, MD as PCP - General (Family Medicine) Ladene Artist, MD as Consulting Physician (Gastroenterology) Adria Devon Rayne Du, MD as Consulting Physician (Gastroenterology) Josue Hector, MD as Consulting Physician (Cardiology) No hospitalizations, ER visits, or surgeries this past year.      Assessment:   This is a routine wellness examination for [place].  Hearing/Vision screen No deficits noted during visit.   Dietary issues and exercise activities discussed: Current Exercise Habits: Structured exercise class, Type of exercise: strength training/weights;calisthenics;stretching, Time (Minutes): 60, Frequency (Times/Week): 3, Weekly Exercise (Minutes/Week): 180,  Intensity: Moderate, Exercise limited by: None identified  Goals Exercise for at least 150 minutes a week  Depression Screen PHQ 2/9 Scores 01/09/2018 09/04/2017 05/01/2017 01/03/2017  PHQ - 2 Score 0 0 0 0    Fall Risk Fall Risk  01/09/2018 09/04/2017 05/01/2017 01/03/2017 09/26/2016  Falls in the past year? No No No No No    Is the patient's home free of loose throw rugs in walkways, pet beds, electrical cords, etc?   yes      Grab bars in the bathroom? no      Handrails on the stairs?   yes      Adequate lighting?   yes   Cognitive Function: MMSE - Mini Mental State Exam 01/09/2018 01/03/2017 01/19/2016  Orientation to time 5 5 4   Orientation to Place 5 5 5   Registration 3 3 3   Attention/ Calculation 5 5 2   Recall 3 3 3   Language- name 2 objects 2 2 2   Language- repeat 1 1 1   Language- follow 3 step command 3 3 3   Language- read & follow direction 1 1 1   Write a sentence 1 1 1   Copy design 0 1 1  Total score 29 30 26         Screening Tests Health Maintenance  Topic Date Due  . TETANUS/TDAP  04/12/2019  . COLONOSCOPY  09/07/2020  . INFLUENZA VACCINE  Completed  . Hepatitis C Screening  Completed  . PNA vac Low Risk Adult  Completed       Plan:   Keep f/u with PCP Increase activity level. Aim for 150 minutes of moderate activity a week  I have personally reviewed and noted the following in the patient's chart:   . Medical and social history . Use of alcohol, tobacco or illicit drugs  . Current medications and supplements . Functional ability and status . Nutritional status . Physical activity . Advanced directives . List of other physicians . Hospitalizations, surgeries, and ER visits in previous 12 months . Vitals . Screenings to include cognitive, depression, and falls . Referrals and appointments  In addition, I have reviewed and discussed with patient certain preventive protocols, quality metrics, and best practice  recommendations. A written personalized  care plan for preventive services as well as general preventive health recommendations were provided to patient.     Chong Sicilian, RN   01/09/2018    I have reviewed and agree with the above AWV documentation.   Laroy Apple, MD [facility] 01/18/2018, 5:33 PM

## 2018-01-12 DIAGNOSIS — H04123 Dry eye syndrome of bilateral lacrimal glands: Secondary | ICD-10-CM | POA: Diagnosis not present

## 2018-01-16 ENCOUNTER — Ambulatory Visit (INDEPENDENT_AMBULATORY_CARE_PROVIDER_SITE_OTHER): Payer: Medicare Other | Admitting: Family Medicine

## 2018-01-16 ENCOUNTER — Encounter: Payer: Self-pay | Admitting: Family Medicine

## 2018-01-16 VITALS — BP 128/65 | HR 56 | Temp 97.0°F | Ht 70.0 in | Wt 195.4 lb

## 2018-01-16 DIAGNOSIS — M25511 Pain in right shoulder: Secondary | ICD-10-CM

## 2018-01-16 DIAGNOSIS — I10 Essential (primary) hypertension: Secondary | ICD-10-CM

## 2018-01-16 DIAGNOSIS — N400 Enlarged prostate without lower urinary tract symptoms: Secondary | ICD-10-CM

## 2018-01-16 NOTE — Progress Notes (Signed)
   HPI  Patient presents today shoulder pain.  Explains that he was lying under a sink changing up a new faucet when he felt sharp pain in his right posterior shoulder.  He states that spent about 3 months and he continues to have pain with specific movements and difficulty sleeping on that shoulder. He denies any significant limitations. He is interested in physical therapy. 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  Hypertension Good medication compliance No chest pain or headaches.  BPH-history of  PMH: Smoking status noted ROS: Per HPI  Objective: BP 128/65   Pulse (!) 56   Temp (!) 97 F (36.1 C) (Oral)   Ht _0  (1.778 m)   Wt 195 lb 6.4 oz (88.6 kg)   BMI 28.04 kg/m  Gen: NAD, alert, cooperative with exam HEENT: NCAT CV: RRR, good S1/S2, no murmur Resp: CTABL, no wheezes, non-labored Ext: No edema, warm Neuro: Alert and oriented, No gross deficits  MSK:  There is to palpation of her posterior shoulder over infraspinatus close to the origin, full range of motion Preserved strength Positive Hawkins sign, negative empty can and Neer sign.   Assessment and plan:  #Right shoulder pain Likely rotator cuff syndrome, no signs of complete tear, however if we were to undergo MRI or ultrasound imaging he may have partial tear. His pain to palpation is over the posterior shoulder around the insertions of the teres minor and infraspinatus. Could consider Nitropatch Could consider subacromial bursa injection Referred to physical therapy for now  Hypertension Well-controlled on amlodipine plus atenolol plus losartan Labs today  BPH No complaints PSA drawn today   Orders Placed This Encounter  Procedures  . CMP14+EGFR  . CBC with Differential/Platelet  . Lipid panel  . TSH  . PSA  . Ambulatory referral to Physical Therapy    Referral Priority:   Routine    Referral Type:   Physical Medicine    Referral Reason:   Specialty Services Required    Requested Specialty:    Physical Therapy    Number of Visits Requested:   [place], MD [facility] 01/16/2018, 8:45 AM

## 2018-01-16 NOTE — Patient Instructions (Signed)
Great to see you!   

## 2018-01-17 ENCOUNTER — Other Ambulatory Visit: Payer: Self-pay

## 2018-01-17 ENCOUNTER — Ambulatory Visit: Payer: Medicare Other | Attending: Family Medicine | Admitting: Physical Therapy

## 2018-01-17 ENCOUNTER — Encounter: Payer: Self-pay | Admitting: Physical Therapy

## 2018-01-17 DIAGNOSIS — M25511 Pain in right shoulder: Secondary | ICD-10-CM | POA: Diagnosis not present

## 2018-01-17 DIAGNOSIS — R293 Abnormal posture: Secondary | ICD-10-CM | POA: Diagnosis not present

## 2018-01-17 LAB — LIPID PANEL
Chol/HDL Ratio: 3.3 ratio (ref 0.0–5.0)
Cholesterol, Total: 119 mg/dL (ref 100–199)
HDL: 36 mg/dL — AB (ref >39)
LDL Calculated: 69 mg/dL (ref 0–99)
Triglycerides: 68 mg/dL (ref 0–149)
VLDL Cholesterol Cal: 14 mg/dL (ref 5–40)

## 2018-01-17 LAB — CBC WITH DIFFERENTIAL/PLATELET
Basophils Absolute: 0 10*3/uL (ref 0.0–0.2)
Basos: 1 %
EOS (ABSOLUTE): 0.1 10*3/uL (ref 0.0–0.4)
Eos: 1 %
Hematocrit: 46.6 % (ref 37.5–51.0)
Hemoglobin: 15.8 g/dL (ref 13.0–17.7)
IMMATURE GRANULOCYTES: 0 %
Immature Grans (Abs): 0 10*3/uL (ref 0.0–0.1)
LYMPHS ABS: 2.3 10*3/uL (ref 0.7–3.1)
Lymphs: 42 %
MCH: 35 pg — ABNORMAL HIGH (ref 26.6–33.0)
MCHC: 33.9 g/dL (ref 31.5–35.7)
MCV: 103 fL — AB (ref 79–97)
Monocytes Absolute: 0.5 10*3/uL (ref 0.1–0.9)
Monocytes: 10 %
NEUTROS PCT: 46 %
Neutrophils Absolute: 2.6 10*3/uL (ref 1.4–7.0)
PLATELETS: 142 10*3/uL — AB (ref 150–379)
RBC: 4.52 x10E6/uL (ref 4.14–5.80)
RDW: 13.9 % (ref 12.3–15.4)
WBC: 5.6 10*3/uL (ref 3.4–10.8)

## 2018-01-17 LAB — CMP14+EGFR
A/G RATIO: 1.9 (ref 1.2–2.2)
ALK PHOS: 48 IU/L (ref 39–117)
ALT: 26 IU/L (ref 0–44)
AST: 26 IU/L (ref 0–40)
Albumin: 4.3 g/dL (ref 3.5–4.8)
BILIRUBIN TOTAL: 0.4 mg/dL (ref 0.0–1.2)
BUN/Creatinine Ratio: 14 (ref 10–24)
BUN: 11 mg/dL (ref 8–27)
CO2: 26 mmol/L (ref 20–29)
Calcium: 9.1 mg/dL (ref 8.6–10.2)
Chloride: 100 mmol/L (ref 96–106)
Creatinine, Ser: 0.8 mg/dL (ref 0.76–1.27)
GFR calc Af Amer: 105 mL/min/{1.73_m2} (ref >59)
GFR calc non Af Amer: 91 mL/min/{1.73_m2} (ref >59)
GLOBULIN, TOTAL: 2.3 g/dL (ref 1.5–4.5)
Glucose: 77 mg/dL (ref 65–99)
POTASSIUM: 3.4 mmol/L — AB (ref 3.5–5.2)
Sodium: 142 mmol/L (ref 134–144)
Total Protein: 6.6 g/dL (ref 6.0–8.5)

## 2018-01-17 LAB — PSA: Prostate Specific Ag, Serum: 0.9 ng/mL (ref 0.0–4.0)

## 2018-01-17 LAB — TSH: TSH: 2.06 u[IU]/mL (ref 0.450–4.500)

## 2018-01-17 NOTE — Therapy (Signed)
[facility] [place], [place], [address] Phone: [phone]   Fax:  [phone]  Physical Therapy Evaluation  Patient Details  Name: Joseph Larsen MRN: [id number] Date of Birth: 09-Jun-1946 Referring Provider: Kenn File MD   Encounter Date: 01/17/2018  PT End of Session - 01/17/18 1003    Visit Number  1    Number of Visits  12    Date for PT Re-Evaluation  02/28/18    Authorization Type  FOTO AT LEAST EVERY 5TH VISIT.    PT Start Time  0900    PT Stop Time  0951    PT Time Calculation (min)  51 min    Activity Tolerance  Patient tolerated treatment well;Treatment limited secondary to medical complications (Comment)       Past Medical History:  Diagnosis Date  . Abnormal ECG    a. 08/2012 Abnl ETT with ST changes in recovery;  b. 08/2012 Ex MV, ex time 10:00, EF 66%, no ischemia/infarct.  . Allergic rhinitis   . Anxiety   . Cataract   . Depression   . Diverticular disease   . Dyspnea on exertion   . ETOH abuse   . GERD (gastroesophageal reflux disease)   . Gout   . Hemorrhoids   . Hiatal hernia   . HTN (hypertension)   . Low testosterone   . MACROCYTIC ANEMIA   . NONSPEC ELEVATION OF LEVELS OF TRANSAMINASE/LDH   . Nonspecific abnormal finding in stool contents   . Palpitations   . PTSD (post-traumatic stress disorder)   . PTSD (post-traumatic stress disorder)    [name] - [employer]    Past Surgical History:  Procedure Laterality Date  . CHOLECYSTECTOMY  2004  . KNEE SURGERY Bilateral     There were no vitals filed for this visit.   Subjective Assessment - 01/17/18 1229    Subjective  The patient reports the onset of right pain about 3 months ago while installing a kitchen faucet.  He reports a 4/10 pain-level today and significantly higher with certain movements.  Rest decreases pain.  The patient states that his pain is disturbed by pain.    Patient Stated Goals  Use right shouler without pain.    Pain Score  4     Pain Location  Shoulder    Pain Orientation  Right    Pain Descriptors / Indicators  Aching    Pain Type  Acute pain    Pain Onset  More than a month ago         [facility] PT Assessment - 01/17/18 0001      Assessment   Medical Diagnosis  Right shoulder pain.    Referring Provider  Kenn File MD    Onset Date/Surgical Date  -- 3 months.      Precautions   Precautions  None      Restrictions   Weight Bearing Restrictions  No      Balance Screen   Has the patient fallen in the past 6 months  No    Has the patient had a decrease in activity level because of a fear of falling?   No    Is the patient reluctant to leave their home because of a fear of falling?   No      Home Film/video editor residence      Prior Function   Level of Independence  Independent      Observation/Other  Assessments   Focus on Therapeutic Outcomes (FOTO)   52% limitation.      Posture/Postural Control   Posture/Postural Control  Postural limitations    Postural Limitations  Rounded Shoulders;Forward head      ROM / Strength   AROM / PROM / Strength  AROM;Strength      AROM   Overall AROM Comments  Full active right shoulder range of motion.      Strength   Overall Strength Comments  Right shoulder strength is normal.      Palpation   Palpation comment  Tender to palpation right Infraspinatus near humeral insertion.      Special Tests    Special Tests  Rotator Cuff Impingement    Rotator Cuff Impingment tests  Neer impingement test;Hawkins- Joseph test;Drop Arm test      Neer Impingement test    Findings  Negative    Side  Right      Hawkins-Joseph test   Findings  Positive    Side  Right      Drop Arm test   Findings  Negative    Side  Right             Objective measurements completed on examination: See above findings.      [facility] Adult PT Treatment/Exercise - 01/17/18 0001      Modalities   Modalities  Electrical  Stimulation;Moist Heat      Moist Heat Therapy   Number Minutes Moist Heat  20 Minutes    Moist Heat Location  -- Left shoulder.      Electrical Stimulation   Electrical Stimulation Location  Left posterior shoulder.    Electrical Stimulation Action  IFC    Electrical Stimulation Parameters  80-150 Hz x 20 minutes.    Electrical Stimulation Goals  Pain                  PT Long Term Goals - 01/17/18 1257      PT LONG TERM GOAL #1   Title  Independent with a HEP.    Time  6    Period  Weeks    Status  New      PT LONG TERM GOAL #2   Title  Perform ADL's with right shoulder pain not > 1-2/10.    Time  6    Period  Weeks    Status  New      PT LONG TERM GOAL #3   Title  Sleep undisturbed without shoulder pain.    Time  6    Period  Weeks    Status  New      PT LONG TERM GOAL #4   Title  Negative Hawkins-Joseph test to right shoulder.    Time  6    Period  Weeks    Status  New             Plan - 01/17/18 1253    Clinical Impression Statement  The patient presents to OPPT with c/o right shoulder pain after installing a kitchen faucet 3 months ago.  He has full AROM of the right shoulder but certain movements are painful including reaching backward.  He demonstrates a positive right Hawkins-Joseph test.  His sleep is disturbed by pain.  Patient will benefit from skilled PT intervention.    Clinical Presentation  Evolving    Clinical Presentation due to:  Not improving.    Clinical Decision Making  Low    Rehab Potential  Excellent    PT Frequency  2x / week    PT Duration  6 weeks    PT Treatment/Interventions  ADLs/Self Care Home Management;Cryotherapy;Electrical Stimulation;Ultrasound;Moist Heat;Therapeutic exercise;Therapeutic activities;Patient/family education;Manual techniques;Dry needling    PT Next Visit Plan  Combo e'stim/U/S to right infraspinatus f/b IASTM; RW4, SDLY ER.      Consulted and Agree with Plan of Care  Patient       Patient  will benefit from skilled therapeutic intervention in order to improve the following deficits and impairments:  Pain, Postural dysfunction  Visit Diagnosis: Acute pain of right shoulder - Plan: PT plan of care cert/re-cert  Abnormal posture - Plan: PT plan of care cert/re-cert     Problem List Patient Active Problem List   Diagnosis Date Noted  . Barrett's esophagus 01/03/2017  . Non-healing ulcer ([place]) 10/13/2015  . Neoplasm of uncertain behavior of skin 10/08/2015  . Skin lesion 09/07/2015  . Hypokalemia 03/03/2014  . BPH (benign prostatic hyperplasia) 03/03/2014  . Chest pain   . Palpitations   . Dyspnea on exertion   . ETOH abuse   . PTSD (post-traumatic stress disorder) 10/02/2013  . Abnormal stress test 08/29/2012  . GERD (gastroesophageal reflux disease)   . HTN (hypertension)   . Anxiety   . Depression   . Allergic rhinitis   . Diverticular disease   . Hemorrhoids   . Hiatal hernia   . Gout   . MACROCYTIC ANEMIA 08/09/2010  . NONSPEC ELEVATION OF LEVELS OF TRANSAMINASE/LDH 08/09/2010  . NONSPECIFIC ABNORMAL FINDING IN STOOL CONTENTS 08/09/2010    [place], [place] MPT 01/17/2018, 1:02 PM  [facility] [address] [place], [place], [address] Phone: [phone]   Fax:  [phone]  Name: Joseph Larsen MRN: [id number] Date of Birth: July 18, 1946

## 2018-01-19 ENCOUNTER — Encounter: Payer: Medicare Other | Admitting: Physical Therapy

## 2018-01-24 ENCOUNTER — Ambulatory Visit: Payer: Medicare Other | Admitting: *Deleted

## 2018-01-24 ENCOUNTER — Encounter: Payer: Self-pay | Admitting: *Deleted

## 2018-01-24 DIAGNOSIS — R293 Abnormal posture: Secondary | ICD-10-CM | POA: Diagnosis not present

## 2018-01-24 DIAGNOSIS — M25511 Pain in right shoulder: Secondary | ICD-10-CM

## 2018-01-24 NOTE — Therapy (Signed)
[facility] [place], [place], [address] Phone: [phone]   Fax:  [phone]  Physical Therapy Treatment  Patient Details  Name: Joseph Larsen MRN: [id number] Date of Birth: 1946/07/26 Referring Provider: Kenn File MD   Encounter Date: 01/24/2018  PT End of Session - 01/24/18 1011    Visit Number  2    Number of Visits  12    Date for PT Re-Evaluation  02/28/18    Authorization Type  FOTO AT LEAST EVERY 5TH VISIT.    PT Start Time  0900    PT Stop Time  0950    PT Time Calculation (min)  50 min       Past Medical History:  Diagnosis Date  . Abnormal ECG    a. 08/2012 Abnl ETT with ST changes in recovery;  b. 08/2012 Ex MV, ex time 10:00, EF 66%, no ischemia/infarct.  . Allergic rhinitis   . Anxiety   . Cataract   . Depression   . Diverticular disease   . Dyspnea on exertion   . ETOH abuse   . GERD (gastroesophageal reflux disease)   . Gout   . Hemorrhoids   . Hiatal hernia   . HTN (hypertension)   . Low testosterone   . MACROCYTIC ANEMIA   . NONSPEC ELEVATION OF LEVELS OF TRANSAMINASE/LDH   . Nonspecific abnormal finding in stool contents   . Palpitations   . PTSD (post-traumatic stress disorder)   . PTSD (post-traumatic stress disorder)    [name]    Past Surgical History:  Procedure Laterality Date  . CHOLECYSTECTOMY  2004  . KNEE SURGERY Bilateral     There were no vitals filed for this visit.  Subjective Assessment - 01/24/18 0900    Subjective  Pain today is 3-4/10. Hurts in the back part of RT shldr    Patient Stated Goals  Use right shouler without pain.    Currently in Pain?  Yes    Pain Score  4     Pain Location  Shoulder    Pain Orientation  Right    Pain Descriptors / Indicators  Aching    Pain Type  Acute pain    Pain Onset  More than a month ago                      [facility] Adult PT Treatment/Exercise - 01/24/18 0001      Exercises   Exercises   Shoulder      Shoulder Exercises: Supine   External Rotation  Strengthening;Right;10 reps;20 reps 3x10     Theraband Level (Shoulder External Rotation)  Level 1 (Yellow)    Internal Rotation  Strengthening;Right;20 reps;10 reps 3x10    Theraband Level (Shoulder Internal Rotation)  Level 1 (Yellow)      Modalities   Modalities  Electrical Stimulation;Moist Heat;Ultrasound      Moist Heat Therapy   Number Minutes Moist Heat  15 Minutes    Moist Heat Location  Shoulder      Electrical Stimulation   Electrical Stimulation Location  RT  posterior shoulder. IFC x 15 mins 80-150hz     Electrical Stimulation Goals  Pain      Ultrasound   Ultrasound Location  RT     Ultrasound Parameters  1.5 w/cm2 x 10 mins    Ultrasound Goals  Pain      Manual Therapy   Manual Therapy  Soft tissue mobilization  Soft tissue mobilization  STW along Infraspinatus belly, and posterior deltoid                  PT Long Term Goals - 01/17/18 1257      PT LONG TERM GOAL #1   Title  Independent with a HEP.    Time  6    Period  Weeks    Status  New      PT LONG TERM GOAL #2   Title  Perform ADL's with right shoulder pain not > 1-2/10.    Time  6    Period  Weeks    Status  New      PT LONG TERM GOAL #3   Title  Sleep undisturbed without shoulder pain.    Time  6    Period  Weeks    Status  New      PT LONG TERM GOAL #4   Title  Negative Hawkins-Kennedy test to right shoulder.    Time  6    Period  Weeks    Status  New            Plan - 01/24/18 1013    Clinical Impression Statement  Pt arrived today with 4/10 posterior RT shldr pain. He did well with tband ER/IR with some discomfort with ER (please give tband for HEP next visit) . [place] and STW were performed to posterior aspect LT shldr along infraspinatus belly. Notable TPs along this area during STW. Normal response to madalities    Clinical Presentation  Evolving    Clinical Decision Making  Low    Rehab Potential   Excellent    PT Frequency  2x / week    PT Duration  6 weeks    PT Treatment/Interventions  ADLs/Self Care Home Management;Cryotherapy;Electrical Stimulation;Ultrasound;Moist Heat;Therapeutic exercise;Therapeutic activities;Patient/family education;Manual techniques;Dry needling    Consulted and Agree with Plan of Care  Patient       Patient will benefit from skilled therapeutic intervention in order to improve the following deficits and impairments:     Visit Diagnosis: Acute pain of right shoulder  Abnormal posture     Problem List Patient Active Problem List   Diagnosis Date Noted  . Barrett's esophagus 01/03/2017  . Non-healing ulcer ([place]) 10/13/2015  . Neoplasm of uncertain behavior of skin 10/08/2015  . Skin lesion 09/07/2015  . Hypokalemia 03/03/2014  . BPH (benign prostatic hyperplasia) 03/03/2014  . Chest pain   . Palpitations   . Dyspnea on exertion   . ETOH abuse   . PTSD (post-traumatic stress disorder) 10/02/2013  . Abnormal stress test 08/29/2012  . GERD (gastroesophageal reflux disease)   . HTN (hypertension)   . Anxiety   . Depression   . Allergic rhinitis   . Diverticular disease   . Hemorrhoids   . Hiatal hernia   . Gout   . MACROCYTIC ANEMIA 08/09/2010  . NONSPEC ELEVATION OF LEVELS OF TRANSAMINASE/LDH 08/09/2010  . NONSPECIFIC ABNORMAL FINDING IN STOOL CONTENTS 08/09/2010    RAMSEUR,CHRIS, PTA 01/24/2018, 10:27 AM  [facility] [place], [place], [address] Phone: [phone]   Fax:  [phone]  Name: Joseph Larsen MRN: [id number] Date of Birth: [birth year]

## 2018-01-26 ENCOUNTER — Encounter: Payer: Self-pay | Admitting: Physical Therapy

## 2018-01-26 ENCOUNTER — Ambulatory Visit: Payer: Medicare Other | Admitting: Physical Therapy

## 2018-01-26 DIAGNOSIS — M25511 Pain in right shoulder: Secondary | ICD-10-CM

## 2018-01-26 DIAGNOSIS — R293 Abnormal posture: Secondary | ICD-10-CM | POA: Diagnosis not present

## 2018-01-26 NOTE — Therapy (Signed)
[facility] [address] [place], [place], [address] Phone: [phone]   Fax:  [phone]  Physical Therapy Treatment  Patient Details  Name: Joseph Larsen MRN: [id number] Date of Birth: July 17, 1946 Referring Provider: Kevin Fenton MD   Encounter Date: 01/26/2018  PT End of Session - 01/26/18 0928    Visit Number  3    Number of Visits  12    Date for PT Re-Evaluation  02/28/18    Authorization Type  FOTO AT LEAST EVERY 5TH VISIT.    PT Start Time  0848    PT Stop Time  0944    PT Time Calculation (min)  56 min    Activity Tolerance  Patient tolerated treatment well;Treatment limited secondary to medical complications (Comment)       Past Medical History:  Diagnosis Date  . Abnormal ECG    a. 08/2012 Abnl ETT with ST changes in recovery;  b. 08/2012 Ex MV, ex time 10:00, EF 66%, no ischemia/infarct.  . Allergic rhinitis   . Anxiety   . Cataract   . Depression   . Diverticular disease   . Dyspnea on exertion   . ETOH abuse   . GERD (gastroesophageal reflux disease)   . Gout   . Hemorrhoids   . Hiatal hernia   . HTN (hypertension)   . Low testosterone   . MACROCYTIC ANEMIA   . NONSPEC ELEVATION OF LEVELS OF TRANSAMINASE/LDH   . Nonspecific abnormal finding in stool contents   . Palpitations   . PTSD (post-traumatic stress disorder)   . PTSD (post-traumatic stress disorder)    [place] [employer] - [employer]    Past Surgical History:  Procedure Laterality Date  . CHOLECYSTECTOMY  2004  . KNEE SURGERY Bilateral     There were no vitals filed for this visit.  Subjective Assessment - 01/26/18 0931    Subjective  No new complaints.    Patient Stated Goals  Use right shouler without pain.    Pain Score  4     Pain Location  Shoulder    Pain Orientation  Right    Pain Descriptors / Indicators  Aching    Pain Onset  More than a month ago                      [facility] Adult PT Treatment/Exercise - 01/26/18 0001       Exercises   Exercises  Knee/Hip      Electrical Stimulation   Electrical Stimulation Location  Right posterior cuff.    Electrical Stimulation Action  2 channel Pre-mod    Electrical Stimulation Parameters  Constant at 80-150 Hz x 20 minutes.      Ultrasound   Ultrasound Location  Right posterior shoulder.    Ultrasound Parameters  1.50 W/CM2 x 12 minutes.    Ultrasound Goals  Pain      Manual Therapy   Manual Therapy  Soft tissue mobilization    Soft tissue mobilization  IASTM to right posterior cuff musculature and ischemic release technique to TP in Infraspinatus x 11 minutes.             PT Education - 01/26/18 0936    Education provided  Yes    Education Details  [facility] ER    Person(s) Educated  Patient    Methods  Explanation;Demonstration    Comprehension  Verbalized understanding;Returned demonstration          PT Long Term Goals -  01/17/18 1257      PT LONG TERM GOAL #1   Title  Independent with a HEP.    Time  6    Period  Weeks    Status  New      PT LONG TERM GOAL #2   Title  Perform ADL's with right shoulder pain not > 1-2/10.    Time  6    Period  Weeks    Status  New      PT LONG TERM GOAL #3   Title  Sleep undisturbed without shoulder pain.    Time  6    Period  Weeks    Status  New      PT LONG TERM GOAL #4   Title  Negative Hawkins-Kennedy test to right shoulder.    Time  6    Period  Weeks    Status  New            Plan - 01/26/18 0934    Clinical Impression Statement  Excellent response to treatment today.  Added sdly ER with 2# today to be added to his HEP.    PT Treatment/Interventions  ADLs/Self Care Home Management;Cryotherapy;Electrical Stimulation;Ultrasound;Moist Heat;Therapeutic exercise;Therapeutic activities;Patient/family education;Manual techniques;Dry needling    PT Next Visit Plan  Combo e'stim/U/S to right infraspinatus f/b IASTM; RW4, SDLY ER.      Consulted and Agree with Plan of Care  Patient        Patient will benefit from skilled therapeutic intervention in order to improve the following deficits and impairments:     Visit Diagnosis: Acute pain of right shoulder  Abnormal posture     Problem List Patient Active Problem List   Diagnosis Date Noted  . Barrett's esophagus 01/03/2017  . Non-healing ulcer ([facility]) 10/13/2015  . Neoplasm of uncertain behavior of skin 10/08/2015  . Skin lesion 09/07/2015  . Hypokalemia 03/03/2014  . BPH (benign prostatic hyperplasia) 03/03/2014  . Chest pain   . Palpitations   . Dyspnea on exertion   . ETOH abuse   . PTSD (post-traumatic stress disorder) 10/02/2013  . Abnormal stress test 08/29/2012  . GERD (gastroesophageal reflux disease)   . HTN (hypertension)   . Anxiety   . Depression   . Allergic rhinitis   . Diverticular disease   . Hemorrhoids   . Hiatal hernia   . Gout   . MACROCYTIC ANEMIA 08/09/2010  . NONSPEC ELEVATION OF LEVELS OF TRANSAMINASE/LDH 08/09/2010  . NONSPECIFIC ABNORMAL FINDING IN STOOL CONTENTS 08/09/2010    [name], [place] MPT 01/26/2018, 9:56 AM  [facility] [address] [place], [place], [address] Phone: [phone]   Fax:  [phone]  Name: Joseph Larsen MRN: [id number] Date of Birth: October 22, 1946

## 2018-01-30 ENCOUNTER — Ambulatory Visit: Payer: Medicare Other | Admitting: Physical Therapy

## 2018-01-30 ENCOUNTER — Encounter: Payer: Self-pay | Admitting: Physical Therapy

## 2018-01-30 DIAGNOSIS — M25511 Pain in right shoulder: Secondary | ICD-10-CM

## 2018-01-30 DIAGNOSIS — R293 Abnormal posture: Secondary | ICD-10-CM

## 2018-01-30 NOTE — Therapy (Signed)
[facility] [place], [place], [address] Phone: [phone]   Fax:  [phone]  Physical Therapy Treatment  Patient Details  Name: Joseph Larsen MRN: [id number] Date of Birth: 1946/10/09 Referring Provider: Kenn File MD   Encounter Date: 01/30/2018  PT End of Session - 01/30/18 1154    Visit Number  4    Number of Visits  12    Date for PT Re-Evaluation  02/28/18    Authorization Type  FOTO AT LEAST EVERY 5TH VISIT.    PT Start Time  1112    PT Stop Time  1210    PT Time Calculation (min)  58 min    Activity Tolerance  Patient tolerated treatment well    Behavior During Therapy  WFL for tasks assessed/performed       Past Medical History:  Diagnosis Date  . Abnormal ECG    a. 08/2012 Abnl ETT with ST changes in recovery;  b. 08/2012 Ex MV, ex time 10:00, EF 66%, no ischemia/infarct.  . Allergic rhinitis   . Anxiety   . Cataract   . Depression   . Diverticular disease   . Dyspnea on exertion   . ETOH abuse   . GERD (gastroesophageal reflux disease)   . Gout   . Hemorrhoids   . Hiatal hernia   . HTN (hypertension)   . Low testosterone   . MACROCYTIC ANEMIA   . NONSPEC ELEVATION OF LEVELS OF TRANSAMINASE/LDH   . Nonspecific abnormal finding in stool contents   . Palpitations   . PTSD (post-traumatic stress disorder)   . PTSD (post-traumatic stress disorder)    [name]    Past Surgical History:  Procedure Laterality Date  . CHOLECYSTECTOMY  2004  . KNEE SURGERY Bilateral     There were no vitals filed for this visit.  Subjective Assessment - 01/30/18 1155    Subjective  Patient states his shoulder is definitely better but still hurts with certain movements.    Patient Stated Goals  Use right shouler without pain.    Pain Score  3     Pain Orientation  Right    Pain Onset  More than a month ago                      [facility] Adult PT Treatment/Exercise - 01/30/18 0001       Modalities   Modalities  Electrical Stimulation;Moist Heat;Ultrasound      Moist Heat Therapy   Number Minutes Moist Heat  20 Minutes    Moist Heat Location  -- Right posterior shoulder.      Acupuncturist Location  Right posterior shoulder.    Electrical Stimulation Action  IFC    Electrical Stimulation Parameters  Constant at 80-150 Hz x 20 minutes.      Ultrasound   Ultrasound Location  RT POST SHLD    Ultrasound Parameters  Combo e'stim/U/S at 1.50 W/CM2 x 12 minutes.    Ultrasound Goals  Pain      Manual Therapy   Manual Therapy  Soft tissue mobilization    Soft tissue mobilization  STW/M TP release technique including IASTM to right posterior cuff x 12 minutes.                  PT Long Term Goals - 01/17/18 1257      PT LONG TERM GOAL #1   Title  Independent with  a HEP.    Time  6    Period  Weeks    Status  New      PT LONG TERM GOAL #2   Title  Perform ADL's with right shoulder pain not > 1-2/10.    Time  6    Period  Weeks    Status  New      PT LONG TERM GOAL #3   Title  Sleep undisturbed without shoulder pain.    Time  6    Period  Weeks    Status  New      PT LONG TERM GOAL #4   Title  Negative Hawkins-Kennedy test to right shoulder.    Time  6    Period  Weeks    Status  New              Patient will benefit from skilled therapeutic intervention in order to improve the following deficits and impairments:     Visit Diagnosis: Acute pain of right shoulder  Abnormal posture     Problem List Patient Active Problem List   Diagnosis Date Noted  . Barrett's esophagus 01/03/2017  . Non-healing ulcer ([place]) 10/13/2015  . Neoplasm of uncertain behavior of skin 10/08/2015  . Skin lesion 09/07/2015  . Hypokalemia 03/03/2014  . BPH (benign prostatic hyperplasia) 03/03/2014  . Chest pain   . Palpitations   . Dyspnea on exertion   . ETOH abuse   . PTSD (post-traumatic stress disorder)  10/02/2013  . Abnormal stress test 08/29/2012  . GERD (gastroesophageal reflux disease)   . HTN (hypertension)   . Anxiety   . Depression   . Allergic rhinitis   . Diverticular disease   . Hemorrhoids   . Hiatal hernia   . Gout   . MACROCYTIC ANEMIA 08/09/2010  . NONSPEC ELEVATION OF LEVELS OF TRANSAMINASE/LDH 08/09/2010  . NONSPECIFIC ABNORMAL FINDING IN STOOL CONTENTS 08/09/2010    Joseph Larsen, [place] MPT 01/30/2018, 12:21 PM  [facility] [address] [place], [place], [address] Phone: [phone]   Fax:  [phone]  Name: Joseph Larsen MRN: [id number] Date of Birth: April 11, 1946

## 2018-01-30 NOTE — Patient Instructions (Signed)
[facility]  DRY NEEDLING CONSENT FORM   Trigger point dry needling is a physical therapy approach to treat Myofascial Pain and Dysfunction.  Dry Needling (DN) is a valuable and effective way to deactivate myofascial trigger points (muscle knots/pain). It is skilled intervention that uses a thin filiform needle to penetrate the skin and stimulate underlying myofascial trigger points, muscular, and connective tissues for the management of neuromusculoskeletal pain and movement impairments.  A local twitch response (LTR) will be elicited.  This can sometimes feel like a deep ache in the muscle during the procedure. Multiple trigger points in multiple muscles can be treated during each treatment.  No medication of any kind is injected.   As with any medical treatment and procedure, there are possible adverse events.  While significant adverse events are uncommon, they do sometimes occur and must be considered prior to giving consent.  1. Dry needling often causes a "post needling soreness".  There can be an increase in pain from a couple of hours to 2-3 days, followed by an improvement in the overall pain state. 2. Any time a needle is used there is a risk of infection.  However, we are using new, sterile, and disposable needles; infections are extremely rare. 3. There is a possibility that you may bleed or bruise.  You may feel tired and some nausea following treatment. 4. There is a rare possibility of a pneumothorax (air in the chest cavity). 5. Allergic reaction to nickel in the stainless steel needle. 6. If a nerve is touched, it may cause paresthesia (a prickling/shock sensation) which is usually brief, but may continue for a couple of days.  Following treatment stay hydrated.  Continue regular activities but not too vigorous initially after treatment for 24-48 hours.  Dry Needling is best when combined with other physical therapy interventions such as  strengthening, stretching and other therapeutic modalities.   PLEASE ANSWER THE FOLLOWING QUESTIONS:  Do you have a lack of sensation?   Y/N  Do you have a phobia or fear of needles  Y/N  Are you pregnant?    Y/N If yes:  How many weeks? __________ Do you have any implanted devices?  Y/N If yes:  Pacemaker/Spinal Cord Stimulator/Deep Brain Stimulator/Insulin Pump/Other: ________________ Do you have any implants?  Y/N If yes: Breast/Facial/Pecs/Buttocks/Calves/Hip  Replacement/ Knee Replacement/Other: _________ Do you take any blood thinners?   Y/N If yes: Coumadin (Warfarin)/Other: ___________________ Do you have a bleeding disorder?   Y/N If yes: What kind: _________________________________ Do you take any immunosuppressants?  Y/N If yes:   What kind: _________________________________ Do you take anti-inflammatories?   Y/N If yes: What kind: Advil/Aspirin/Other: ________________ Have you ever been diagnosed with Scoliosis? Y/N Have you had back surgery?   Y/N If yes:  Laminectomy/Fusion/Other: ___________________   I have read, or had read to me, the above.  I have had the opportunity to ask any questions.  All of my questions have been answered to my satisfaction and I understand the risks involved with dry needling.  I consent to examination and treatment at [facility], including dry needling, of any and all of my involved and affected muscles.  

## 2018-02-02 ENCOUNTER — Ambulatory Visit: Payer: Medicare Other | Admitting: Physical Therapy

## 2018-02-02 DIAGNOSIS — M25511 Pain in right shoulder: Secondary | ICD-10-CM | POA: Diagnosis not present

## 2018-02-02 DIAGNOSIS — R293 Abnormal posture: Secondary | ICD-10-CM | POA: Diagnosis not present

## 2018-02-02 NOTE — Therapy (Signed)
[facility] [address] [place], [place], [address] Phone: [phone]   Fax:  [phone]  Physical Therapy Treatment  Patient Details  Name: Joseph Larsen MRN: [id number] Date of Birth: 1946/04/05 Referring Provider: Kevin Fenton MD   Encounter Date: 02/02/2018  PT End of Session - 02/02/18 1026    Visit Number  5    Number of Visits  12    Date for PT Re-Evaluation  02/28/18    Authorization Type  FOTO AT LEAST EVERY 5TH VISIT.    PT Start Time  0902    PT Stop Time  0953    PT Time Calculation (min)  51 min    Activity Tolerance  Patient tolerated treatment well    Behavior During Therapy  [facility] for tasks assessed/performed       Past Medical History:  Diagnosis Date  . Abnormal ECG    a. 08/2012 Abnl ETT with ST changes in recovery;  b. 08/2012 Ex MV, ex time 10:00, EF 66%, no ischemia/infarct.  . Allergic rhinitis   . Anxiety   . Cataract   . Depression   . Diverticular disease   . Dyspnea on exertion   . ETOH abuse   . GERD (gastroesophageal reflux disease)   . Gout   . Hemorrhoids   . Hiatal hernia   . HTN (hypertension)   . Low testosterone   . MACROCYTIC ANEMIA   . NONSPEC ELEVATION OF LEVELS OF TRANSAMINASE/LDH   . Nonspecific abnormal finding in stool contents   . Palpitations   . PTSD (post-traumatic stress disorder)   . PTSD (post-traumatic stress disorder)    [place] [employer]    Past Surgical History:  Procedure Laterality Date  . CHOLECYSTECTOMY  2004  . KNEE SURGERY Bilateral     There were no vitals filed for this visit.  Subjective Assessment - 02/02/18 1033    Subjective  No new complaints.    Pain Score  3     Pain Orientation  Right    Pain Onset  More than a month ago                      [facility] Adult PT Treatment/Exercise - 02/02/18 0001      Exercises   Exercises  Shoulder      Shoulder Exercises: Standing   Other Standing Exercises  Red T-band IR and ER to  fatigue x 2.  Handout provided.  Tactile and verbal cues required for technique.      Shoulder Exercises: ROM/Strengthening   UBE (Upper Arm Bike)  6 minutes at 90 RPM's (3 minutes forward and 3 minutes backward).      Modalities   Modalities  Moist Heat      Moist Heat Therapy   Number Minutes Moist Heat  20 Minutes    Moist Heat Location  -- Right shoulder.      Programme researcher, broadcasting/film/video Location  Right Infraspinatus    Electrical Stimulation Action  Constant pre-mod at 80-150 Hz x 20 minutes.    Electrical Stimulation Goals  Tone;Pain      Ultrasound   Ultrasound Location  In prone:  Right Infraspinatus.    Ultrasound Parameters  Combo e'stim/U/S at 1.50 W/CM2 x 13 minutes.    Ultrasound Goals  Pain       Trigger Point Dry Needling - 02/02/18 1040    Consent Given?  Yes    Education Handout  Provided  Yes    Muscles Treated Upper Body  Infraspinatus    Infraspinatus Response  Twitch response elicited                PT Long Term Goals - 01/17/18 1257      PT LONG TERM GOAL #1   Title  Independent with a HEP.    Time  6    Period  Weeks    Status  New      PT LONG TERM GOAL #2   Title  Perform ADL's with right shoulder pain not > 1-2/10.    Time  6    Period  Weeks    Status  New      PT LONG TERM GOAL #3   Title  Sleep undisturbed without shoulder pain.    Time  6    Period  Weeks    Status  New      PT LONG TERM GOAL #4   Title  Negative Hawkins-Kennedy test to right shoulder.    Time  6    Period  Weeks    Status  New            Plan - 02/02/18 1047    Clinical Impression Statement  Patient responded very well to treatment today.  He does have continued pain over his right Infraspinatus.  He did great with dry needling today and felt better after tretament.    Clinical Presentation  Evolving    Clinical Decision Making  Low    PT Treatment/Interventions  ADLs/Self Care Home Management;Cryotherapy;Electrical  Stimulation;Ultrasound;Moist Heat;Therapeutic exercise;Therapeutic activities;Patient/family education;Manual techniques;Dry needling    PT Next Visit Plan  Combo e'stim/U/S to right infraspinatus f/b IASTM; RW4, SDLY ER.      Consulted and Agree with Plan of Care  Patient       Patient will benefit from skilled therapeutic intervention in order to improve the following deficits and impairments:     Visit Diagnosis: Acute pain of right shoulder  Abnormal posture     Problem List Patient Active Problem List   Diagnosis Date Noted  . Barrett's esophagus 01/03/2017  . Non-healing ulcer (HCC) 10/13/2015  . Neoplasm of uncertain behavior of skin 10/08/2015  . Skin lesion 09/07/2015  . Hypokalemia 03/03/2014  . BPH (benign prostatic hyperplasia) 03/03/2014  . Chest pain   . Palpitations   . Dyspnea on exertion   . ETOH abuse   . PTSD (post-traumatic stress disorder) 10/02/2013  . Abnormal stress test 08/29/2012  . GERD (gastroesophageal reflux disease)   . HTN (hypertension)   . Anxiety   . Depression   . Allergic rhinitis   . Diverticular disease   . Hemorrhoids   . Hiatal hernia   . Gout   . MACROCYTIC ANEMIA 08/09/2010  . NONSPEC ELEVATION OF LEVELS OF TRANSAMINASE/LDH 08/09/2010  . NONSPECIFIC ABNORMAL FINDING IN STOOL CONTENTS 08/09/2010    Kayliegh Boyers, [place] MPT 02/02/2018, 10:54 AM  [facility] [address] [place], [place], [address] Phone: [phone]   Fax:  [phone]  Name: Joseph Larsen MRN: [id number] Date of Birth: 1946/01/08

## 2018-02-13 ENCOUNTER — Ambulatory Visit (INDEPENDENT_AMBULATORY_CARE_PROVIDER_SITE_OTHER): Payer: Medicare Other | Admitting: *Deleted

## 2018-02-13 DIAGNOSIS — E538 Deficiency of other specified B group vitamins: Secondary | ICD-10-CM | POA: Diagnosis not present

## 2018-02-13 NOTE — Progress Notes (Signed)
Pt given Cyanocobalamin inj Tolerated well 

## 2018-02-14 ENCOUNTER — Encounter: Payer: Self-pay | Admitting: Physical Therapy

## 2018-02-14 ENCOUNTER — Ambulatory Visit: Payer: Medicare Other | Attending: Family Medicine | Admitting: Physical Therapy

## 2018-02-14 DIAGNOSIS — M25511 Pain in right shoulder: Secondary | ICD-10-CM | POA: Diagnosis not present

## 2018-02-14 DIAGNOSIS — R293 Abnormal posture: Secondary | ICD-10-CM | POA: Insufficient documentation

## 2018-02-14 NOTE — Therapy (Signed)
[facility] [address] [place], [place], [address] Phone: [phone]   Fax:  [phone]  Physical Therapy Treatment  Patient Details  Name: Joseph Larsen MRN: [id number] Date of Birth: 06-Apr-1946 Referring Provider: Kevin Fenton MD   Encounter Date: 02/14/2018  PT End of Session - 02/14/18 1042    Visit Number  6    Number of Visits  12    Date for PT Re-Evaluation  02/28/18    Authorization Type  FOTO AT LEAST EVERY 5TH VISIT.    PT Start Time  0900    PT Stop Time  0957    PT Time Calculation (min)  57 min       Past Medical History:  Diagnosis Date  . Abnormal ECG    a. 08/2012 Abnl ETT with ST changes in recovery;  b. 08/2012 Ex MV, ex time 10:00, EF 66%, no ischemia/infarct.  . Allergic rhinitis   . Anxiety   . Cataract   . Depression   . Diverticular disease   . Dyspnea on exertion   . ETOH abuse   . GERD (gastroesophageal reflux disease)   . Gout   . Hemorrhoids   . Hiatal hernia   . HTN (hypertension)   . Low testosterone   . MACROCYTIC ANEMIA   . NONSPEC ELEVATION OF LEVELS OF TRANSAMINASE/LDH   . Nonspecific abnormal finding in stool contents   . Palpitations   . PTSD (post-traumatic stress disorder)   . PTSD (post-traumatic stress disorder)    [place] [employer] - [employer]    Past Surgical History:  Procedure Laterality Date  . CHOLECYSTECTOMY  2004  . KNEE SURGERY Bilateral     There were no vitals filed for this visit.  Subjective Assessment - 02/14/18 1008    Subjective  I did pretty good while I was on vacation.  My shoulder isn't hurting at rest today but hurt quite a bit when reaching back.    Patient Stated Goals  Use right shouler without pain.    Currently in Pain?  No/denies No pain at rest today.    Pain Location  Shoulder    Pain Orientation  Right    Pain Descriptors / Indicators  Aching    Pain Onset  More than a month ago                      [facility] Adult PT  Treatment/Exercise - 02/14/18 0001      Exercises   Exercises  Shoulder      Shoulder Exercises: Standing   Other Standing Exercises  Red theraband IR/ER to right shoulder to fatique and 2# ER at 45 degrees of abduction to fatigue.      Shoulder Exercises: ROM/Strengthening   UBE (Upper Arm Bike)  6 minutes at 90 RPM's.      Modalities   Modalities  Electrical Stimulation;Moist Heat      Moist Heat Therapy   Number Minutes Moist Heat  20 Minutes    Moist Heat Location  -- Right shoulder.      Programme researcher, broadcasting/film/video Location  Right posterior cuff.    Electrical Stimulation Action  Constant Pre-mod    Electrical Stimulation Parameters  80-150 Hz x 20 minutes.    Electrical Stimulation Goals  Tone;Pain      Ultrasound   Ultrasound Location  In prone: Right posterior cuff.    Ultrasound Parameters  Combo e'stim/U/S at 1.50 W/CM2 x 12  minutes.    Ultrasound Goals  Pain       Trigger Point Dry Needling - 02/14/18 1019    Muscles Treated Upper Body  Infraspinatus    Infraspinatus Response  Twitch response elicited                PT Long Term Goals - 01/17/18 1257      PT LONG TERM GOAL #1   Title  Independent with a HEP.    Time  6    Period  Weeks    Status  New      PT LONG TERM GOAL #2   Title  Perform ADL's with right shoulder pain not > 1-2/10.    Time  6    Period  Weeks    Status  New      PT LONG TERM GOAL #3   Title  Sleep undisturbed without shoulder pain.    Time  6    Period  Weeks    Status  New      PT LONG TERM GOAL #4   Title  Negative Hawkins-Kennedy test to right shoulder.    Time  6    Period  Weeks    Status  New            Plan - 02/14/18 1031    Clinical Impression Statement  Patient is improving and had no pain at rest today.  He can reproduce his pain, however, with behind the back movement.    PT Treatment/Interventions  ADLs/Self Care Home Management;Cryotherapy;Electrical  Stimulation;Ultrasound;Moist Heat;Therapeutic exercise;Therapeutic activities;Patient/family education;Manual techniques;Dry needling    PT Next Visit Plan  Combo e'stim/U/S to right infraspinatus f/b IASTM; RW4, SDLY ER.      Consulted and Agree with Plan of Care  Patient       Patient will benefit from skilled therapeutic intervention in order to improve the following deficits and impairments:  Pain, Postural dysfunction  Visit Diagnosis: Acute pain of right shoulder  Abnormal posture     Problem List Patient Active Problem List   Diagnosis Date Noted  . Barrett's esophagus 01/03/2017  . Non-healing ulcer (HCC) 10/13/2015  . Neoplasm of uncertain behavior of skin 10/08/2015  . Skin lesion 09/07/2015  . Hypokalemia 03/03/2014  . BPH (benign prostatic hyperplasia) 03/03/2014  . Chest pain   . Palpitations   . Dyspnea on exertion   . ETOH abuse   . PTSD (post-traumatic stress disorder) 10/02/2013  . Abnormal stress test 08/29/2012  . GERD (gastroesophageal reflux disease)   . HTN (hypertension)   . Anxiety   . Depression   . Allergic rhinitis   . Diverticular disease   . Hemorrhoids   . Hiatal hernia   . Gout   . MACROCYTIC ANEMIA 08/09/2010  . NONSPEC ELEVATION OF LEVELS OF TRANSAMINASE/LDH 08/09/2010  . NONSPECIFIC ABNORMAL FINDING IN STOOL CONTENTS 08/09/2010    Joseph Larsen, [place] MPT 02/14/2018, 10:42 AM  [facility] [address] [place], [place], [address] Phone: [phone]   Fax:  [phone]  Name: Joseph Larsen MRN: [id number] Date of Birth: 1945-12-19

## 2018-02-16 ENCOUNTER — Encounter: Payer: Self-pay | Admitting: Physical Therapy

## 2018-02-16 ENCOUNTER — Ambulatory Visit: Payer: Medicare Other | Admitting: Physical Therapy

## 2018-02-16 DIAGNOSIS — M25511 Pain in right shoulder: Secondary | ICD-10-CM | POA: Diagnosis not present

## 2018-02-16 DIAGNOSIS — R293 Abnormal posture: Secondary | ICD-10-CM | POA: Diagnosis not present

## 2018-02-16 NOTE — Therapy (Signed)
[facility] [address] [place], [place], [address] Phone: [phone]   Fax:  [phone]  Physical Therapy Treatment  Patient Details  Name: Joseph Larsen MRN: [id number] Date of Birth: 1946/09/11 Referring Provider: Kevin Fenton MD   Encounter Date: 02/16/2018  PT End of Session - 02/16/18 1031    Visit Number  7    Number of Visits  12    Date for PT Re-Evaluation  02/28/18    PT Start Time  0900    PT Stop Time  0955    PT Time Calculation (min)  55 min    Activity Tolerance  Patient tolerated treatment well    Behavior During Therapy  [facility] for tasks assessed/performed       Past Medical History:  Diagnosis Date  . Abnormal ECG    a. 08/2012 Abnl ETT with ST changes in recovery;  b. 08/2012 Ex MV, ex time 10:00, EF 66%, no ischemia/infarct.  . Allergic rhinitis   . Anxiety   . Cataract   . Depression   . Diverticular disease   . Dyspnea on exertion   . ETOH abuse   . GERD (gastroesophageal reflux disease)   . Gout   . Hemorrhoids   . Hiatal hernia   . HTN (hypertension)   . Low testosterone   . MACROCYTIC ANEMIA   . NONSPEC ELEVATION OF LEVELS OF TRANSAMINASE/LDH   . Nonspecific abnormal finding in stool contents   . Palpitations   . PTSD (post-traumatic stress disorder)   . PTSD (post-traumatic stress disorder)    [place] [employer] - [employer]    Past Surgical History:  Procedure Laterality Date  . CHOLECYSTECTOMY  2004  . KNEE SURGERY Bilateral     There were no vitals filed for this visit.  Subjective Assessment - 02/16/18 0942    Subjective  It's actually feeling better.  I did reach out for something and it hurta lot though.    Pain Score  2     Pain Location  Shoulder    Pain Orientation  Right    Pain Descriptors / Indicators  Aching    Pain Onset  More than a month ago                      [facility] Adult PT Treatment/Exercise - 02/16/18 0001      Exercises   Exercises  Shoulder       Shoulder Exercises: Sidelying   Other Sidelying Exercises  2# SDLY ER to fatigue x 2.      Shoulder Exercises: Standing   Other Standing Exercises  Full can to fatigue x 2 with 3# f/b 3# ER at 45 degrees of abduction to fatigue x 2.      Shoulder Exercises: ROM/Strengthening   UBE (Upper Arm Bike)  8 minutes at 90 RPM's.      Modalities   Modalities  Electrical Stimulation;Ultrasound      Programme researcher, broadcasting/film/video Location  Right shoulder.    Electrical Stimulation Action  IFC    Electrical Stimulation Parameters  80-150 Hz x 20 minutes.    Electrical Stimulation Goals  Tone;Pain      Ultrasound   Ultrasound Location  Right shoulder.    Ultrasound Parameters  Combo e'stim/U/S at 1.50 W/CM2 x 10 minutes.    Ultrasound Goals  Pain                  PT Long  Term Goals - 01/17/18 1257      PT LONG TERM GOAL #1   Title  Independent with a HEP.    Time  6    Period  Weeks    Status  New      PT LONG TERM GOAL #2   Title  Perform ADL's with right shoulder pain not > 1-2/10.    Time  6    Period  Weeks    Status  New      PT LONG TERM GOAL #3   Title  Sleep undisturbed without shoulder pain.    Time  6    Period  Weeks    Status  New      PT LONG TERM GOAL #4   Title  Negative Hawkins-Kennedy test to right shoulder.    Time  6    Period  Weeks    Status  New            Plan - 02/16/18 1027    Clinical Impression Statement  Th epatient is progressing.  His FOTO limitation has improved to 35%.  He does, however, continue to have severe pain with certain right shoulder movements.    PT Treatment/Interventions  ADLs/Self Care Home Management;Cryotherapy;Electrical Stimulation;Ultrasound;Moist Heat;Therapeutic exercise;Therapeutic activities;Patient/family education;Manual techniques;Dry needling    PT Next Visit Plan  Combo e'stim/U/S to right infraspinatus f/b IASTM; RW4, SDLY ER.      Consulted and Agree with Plan of Care  Patient        Patient will benefit from skilled therapeutic intervention in order to improve the following deficits and impairments:  Pain, Postural dysfunction  Visit Diagnosis: Acute pain of right shoulder  Abnormal posture     Problem List Patient Active Problem List   Diagnosis Date Noted  . Barrett's esophagus 01/03/2017  . Non-healing ulcer (HCC) 10/13/2015  . Neoplasm of uncertain behavior of skin 10/08/2015  . Skin lesion 09/07/2015  . Hypokalemia 03/03/2014  . BPH (benign prostatic hyperplasia) 03/03/2014  . Chest pain   . Palpitations   . Dyspnea on exertion   . ETOH abuse   . PTSD (post-traumatic stress disorder) 10/02/2013  . Abnormal stress test 08/29/2012  . GERD (gastroesophageal reflux disease)   . HTN (hypertension)   . Anxiety   . Depression   . Allergic rhinitis   . Diverticular disease   . Hemorrhoids   . Hiatal hernia   . Gout   . MACROCYTIC ANEMIA 08/09/2010  . NONSPEC ELEVATION OF LEVELS OF TRANSAMINASE/LDH 08/09/2010  . NONSPECIFIC ABNORMAL FINDING IN STOOL CONTENTS 08/09/2010    Kimberlin Scheel, [place] MPT 02/16/2018, 10:32 AM  [facility] [address] [place], [place], [address] Phone: [phone]   Fax:  [phone]  Name: PANTALEON VIGNALI MRN: [id number] Date of Birth: 06/21/1946

## 2018-02-21 ENCOUNTER — Ambulatory Visit: Payer: Medicare Other | Admitting: *Deleted

## 2018-02-21 DIAGNOSIS — R293 Abnormal posture: Secondary | ICD-10-CM | POA: Diagnosis not present

## 2018-02-21 DIAGNOSIS — M25511 Pain in right shoulder: Secondary | ICD-10-CM | POA: Diagnosis not present

## 2018-02-21 NOTE — Therapy (Signed)
[facility] [place], [place], [address] Phone: [phone]   Fax:  [phone]  Physical Therapy Treatment  Patient Details  Name: Joseph Larsen MRN: [id number] Date of Birth: 27-Jun-1946 Referring Provider: Kenn File MD   Encounter Date: 02/21/2018  PT End of Session - 02/21/18 0953    Visit Number  8    Number of Visits  12    Date for PT Re-Evaluation  02/28/18    Authorization Type  FOTO AT LEAST EVERY 5TH VISIT.    PT Start Time  0900    PT Stop Time  0950    PT Time Calculation (min)  50 min       Past Medical History:  Diagnosis Date  . Abnormal ECG    a. 08/2012 Abnl ETT with ST changes in recovery;  b. 08/2012 Ex MV, ex time 10:00, EF 66%, no ischemia/infarct.  . Allergic rhinitis   . Anxiety   . Cataract   . Depression   . Diverticular disease   . Dyspnea on exertion   . ETOH abuse   . GERD (gastroesophageal reflux disease)   . Gout   . Hemorrhoids   . Hiatal hernia   . HTN (hypertension)   . Low testosterone   . MACROCYTIC ANEMIA   . NONSPEC ELEVATION OF LEVELS OF TRANSAMINASE/LDH   . Nonspecific abnormal finding in stool contents   . Palpitations   . PTSD (post-traumatic stress disorder)   . PTSD (post-traumatic stress disorder)    [name] - [employer]    Past Surgical History:  Procedure Laterality Date  . CHOLECYSTECTOMY  2004  . KNEE SURGERY Bilateral     There were no vitals filed for this visit.                   [facility] Adult PT Treatment/Exercise - 02/21/18 0001      Shoulder Exercises: Sidelying   External Rotation  -- 3#    Other Sidelying Exercises  3# SDLY ER to fatigue x 2.      Shoulder Exercises: Standing   Other Standing Exercises  Full can to fatigue x 2 with 3# f/b 3# ER at 45 degrees of abduction to fatigue x 2.      Shoulder Exercises: ROM/Strengthening   UBE (Upper Arm Bike)  8 minutes at 90 RPM's.      Modalities   Modalities  Electrical  Stimulation;Ultrasound      Moist Heat Therapy   Number Minutes Moist Heat  20 Minutes    Moist Heat Location  Shoulder      Electrical Stimulation   Electrical Stimulation Location  Right shoulder.  IFC 80-150hz  x 15 min    Electrical Stimulation Goals  Tone;Pain      Ultrasound   Ultrasound Location  RT  shldr    Ultrasound Parameters  Combo 1.5 w/cm2 x 10 mins    Ultrasound Goals  Pain                  PT Long Term Goals - 01/17/18 1257      PT LONG TERM GOAL #1   Title  Independent with a HEP.    Time  6    Period  Weeks    Status  New      PT LONG TERM GOAL #2   Title  Perform ADL's with right shoulder pain not > 1-2/10.    Time  6    Period  Weeks    Status  New      PT LONG TERM GOAL #3   Title  Sleep undisturbed without shoulder pain.    Time  6    Period  Weeks    Status  New      PT LONG TERM GOAL #4   Title  Negative Hawkins-Kennedy test to right shoulder.    Time  6    Period  Weeks    Status  New            Plan - 02/21/18 5374    Clinical Impression Statement  Pt arrived today doing fairly well. His pain in RT shldr is about the same as it has been. He was able to complete therex for RTC rehab with mainly fatigue and a mild increase in pain. He was encouraged to perform HEP 3x daily and see how RT shldr responds. He was still notably sore posterior cuff and pain with reaching back    Clinical Presentation  Evolving    Clinical Decision Making  Low    Rehab Potential  Excellent    PT Frequency  2x / week    PT Duration  6 weeks    PT Treatment/Interventions  ADLs/Self Care Home Management;Cryotherapy;Electrical Stimulation;Ultrasound;Moist Heat;Therapeutic exercise;Therapeutic activities;Patient/family education;Manual techniques;Dry needling    PT Next Visit Plan  Combo e'stim/U/S to right infraspinatus f/b IASTM; RW4, SDLY ER.      Consulted and Agree with Plan of Care  Patient       Patient will benefit from skilled therapeutic  intervention in order to improve the following deficits and impairments:  Pain, Postural dysfunction  Visit Diagnosis: Acute pain of right shoulder  Abnormal posture     Problem List Patient Active Problem List   Diagnosis Date Noted  . Barrett's esophagus 01/03/2017  . Non-healing ulcer ([employer]) 10/13/2015  . Neoplasm of uncertain behavior of skin 10/08/2015  . Skin lesion 09/07/2015  . Hypokalemia 03/03/2014  . BPH (benign prostatic hyperplasia) 03/03/2014  . Chest pain   . Palpitations   . Dyspnea on exertion   . ETOH abuse   . PTSD (post-traumatic stress disorder) 10/02/2013  . Abnormal stress test 08/29/2012  . GERD (gastroesophageal reflux disease)   . HTN (hypertension)   . Anxiety   . Depression   . Allergic rhinitis   . Diverticular disease   . Hemorrhoids   . Hiatal hernia   . Gout   . MACROCYTIC ANEMIA 08/09/2010  . NONSPEC ELEVATION OF LEVELS OF TRANSAMINASE/LDH 08/09/2010  . NONSPECIFIC ABNORMAL FINDING IN STOOL CONTENTS 08/09/2010    Faiz Weber,CHRIS, PTA 02/21/2018, 10:15 AM  [facility] [place], [place], [address] Phone: [phone]   Fax:  [phone]  Name: Joseph Larsen MRN: [id number] Date of Birth: 13-Oct-1946

## 2018-02-22 ENCOUNTER — Encounter: Payer: Self-pay | Admitting: Physical Therapy

## 2018-02-22 ENCOUNTER — Ambulatory Visit: Payer: Medicare Other | Admitting: Physical Therapy

## 2018-02-22 DIAGNOSIS — M25511 Pain in right shoulder: Secondary | ICD-10-CM | POA: Diagnosis not present

## 2018-02-22 DIAGNOSIS — R293 Abnormal posture: Secondary | ICD-10-CM

## 2018-02-22 NOTE — Therapy (Signed)
[facility] [address] [place], [place], [address] Phone: [phone]   Fax:  [phone]  Physical Therapy Treatment  Patient Details  Name: Joseph Larsen MRN: [id number] Date of Birth: January 23, 1946 Referring Provider: Kevin Fenton MD   Encounter Date: 02/22/2018  PT End of Session - 02/22/18 1052    Visit Number  8    Number of Visits  12    Date for PT Re-Evaluation  02/28/18    Authorization Type  FOTO AT LEAST EVERY 5TH VISIT.    PT Start Time  [phone]    PT Stop Time  1033    PT Time Calculation (min)  47 min       Past Medical History:  Diagnosis Date  . Abnormal ECG    a. 08/2012 Abnl ETT with ST changes in recovery;  b. 08/2012 Ex MV, ex time 10:00, EF 66%, no ischemia/infarct.  . Allergic rhinitis   . Anxiety   . Cataract   . Depression   . Diverticular disease   . Dyspnea on exertion   . ETOH abuse   . GERD (gastroesophageal reflux disease)   . Gout   . Hemorrhoids   . Hiatal hernia   . HTN (hypertension)   . Low testosterone   . MACROCYTIC ANEMIA   . NONSPEC ELEVATION OF LEVELS OF TRANSAMINASE/LDH   . Nonspecific abnormal finding in stool contents   . Palpitations   . PTSD (post-traumatic stress disorder)   . PTSD (post-traumatic stress disorder)    [place] [employer] - [employer]    Past Surgical History:  Procedure Laterality Date  . CHOLECYSTECTOMY  2004  . KNEE SURGERY Bilateral     There were no vitals filed for this visit.  Subjective Assessment - 02/22/18 1054    Subjective  My shoulder is feeling better today.    Pain Score  1     Pain Location  Shoulder    Pain Orientation  Right    Pain Descriptors / Indicators  Aching    Pain Type  Acute pain    Pain Onset  More than a month ago                      [facility] Adult PT Treatment/Exercise - 02/22/18 0001      Exercises   Exercises  Shoulder      Shoulder Exercises: Standing   Other Standing Exercises  2# right shoulder full can to  fatigue x 2 f/b right shoulder ER with elbow by side against manual resisitance x 2 minutes.      Shoulder Exercises: ROM/Strengthening   UBE (Upper Arm Bike)  8 minutes at 90 RPM's.      Modalities   Modalities  Electrical Stimulation;Ultrasound      Moist Heat Therapy   Number Minutes Moist Heat  20 Minutes    Moist Heat Location  -- Right shoulder.      Programme researcher, broadcasting/film/video Location  -- Right shoulder.    Electrical Stimulation Action  IFC    Electrical Stimulation Parameters  80-150 Hz x 20 minutes.    Electrical Stimulation Goals  Tone;Pain      Ultrasound   Ultrasound Location  Right shoulder.    Ultrasound Parameters  Combo e'stim/U/S at 1.50 W/CM2 x 11 minutes.    Ultrasound Goals  Pain                  PT Long Term  Goals - 01/17/18 1257      PT LONG TERM GOAL #1   Title  Independent with a HEP.    Time  6    Period  Weeks    Status  New      PT LONG TERM GOAL #2   Title  Perform ADL's with right shoulder pain not > 1-2/10.    Time  6    Period  Weeks    Status  New      PT LONG TERM GOAL #3   Title  Sleep undisturbed without shoulder pain.    Time  6    Period  Weeks    Status  New      PT LONG TERM GOAL #4   Title  Negative Hawkins-Kennedy test to right shoulder.    Time  6    Period  Weeks    Status  New            Plan - 02/22/18 1057    Clinical Impression Statement  Excellent job today.  Patient not reporting any significant pain after the treatment.    PT Treatment/Interventions  ADLs/Self Care Home Management;Cryotherapy;Electrical Stimulation;Ultrasound;Moist Heat;Therapeutic exercise;Therapeutic activities;Patient/family education;Manual techniques;Dry needling    PT Next Visit Plan  Combo e'stim/U/S to right infraspinatus f/b IASTM; RW4, SDLY ER.      Consulted and Agree with Plan of Care  Patient       Patient will benefit from skilled therapeutic intervention in order to improve the following  deficits and impairments:  Pain, Postural dysfunction  Visit Diagnosis: Acute pain of right shoulder  Abnormal posture     Problem List Patient Active Problem List   Diagnosis Date Noted  . Barrett's esophagus 01/03/2017  . Non-healing ulcer (HCC) 10/13/2015  . Neoplasm of uncertain behavior of skin 10/08/2015  . Skin lesion 09/07/2015  . Hypokalemia 03/03/2014  . BPH (benign prostatic hyperplasia) 03/03/2014  . Chest pain   . Palpitations   . Dyspnea on exertion   . ETOH abuse   . PTSD (post-traumatic stress disorder) 10/02/2013  . Abnormal stress test 08/29/2012  . GERD (gastroesophageal reflux disease)   . HTN (hypertension)   . Anxiety   . Depression   . Allergic rhinitis   . Diverticular disease   . Hemorrhoids   . Hiatal hernia   . Gout   . MACROCYTIC ANEMIA 08/09/2010  . NONSPEC ELEVATION OF LEVELS OF TRANSAMINASE/LDH 08/09/2010  . NONSPECIFIC ABNORMAL FINDING IN STOOL CONTENTS 08/09/2010    Joseph Larsen, [place] MPT 02/22/2018, 10:58 AM  [facility] [address] [place], [place], [address] Phone: [phone]   Fax:  [phone]  Name: Joseph Larsen MRN: [id number] Date of Birth: November 08, 1946

## 2018-02-26 ENCOUNTER — Ambulatory Visit: Payer: Medicare Other | Admitting: Physical Therapy

## 2018-02-26 ENCOUNTER — Encounter: Payer: Self-pay | Admitting: Physical Therapy

## 2018-02-26 DIAGNOSIS — R293 Abnormal posture: Secondary | ICD-10-CM | POA: Diagnosis not present

## 2018-02-26 DIAGNOSIS — M25511 Pain in right shoulder: Secondary | ICD-10-CM | POA: Diagnosis not present

## 2018-02-26 NOTE — Therapy (Signed)
[facility] [place], [place], [address] Phone: [phone]   Fax:  [phone]  Physical Therapy Treatment  Patient Details  Name: Joseph Larsen MRN: [id number] Date of Birth: [birth year] Referring Provider: Kenn File MD   Encounter Date: 02/26/2018  PT End of Session - 02/26/18 1127    Visit Number  9    Number of Visits  12    Date for PT Re-Evaluation  02/28/18    Authorization Type  FOTO AT LEAST EVERY 5TH VISIT.    PT Start Time  1030    PT Stop Time  1119    PT Time Calculation (min)  49 min    Activity Tolerance  Patient tolerated treatment well    Behavior During Therapy  WFL for tasks assessed/performed       Past Medical History:  Diagnosis Date  . Abnormal ECG    a. 08/2012 Abnl ETT with ST changes in recovery;  b. 08/2012 Ex MV, ex time 10:00, EF 66%, no ischemia/infarct.  . Allergic rhinitis   . Anxiety   . Cataract   . Depression   . Diverticular disease   . Dyspnea on exertion   . ETOH abuse   . GERD (gastroesophageal reflux disease)   . Gout   . Hemorrhoids   . Hiatal hernia   . HTN (hypertension)   . Low testosterone   . MACROCYTIC ANEMIA   . NONSPEC ELEVATION OF LEVELS OF TRANSAMINASE/LDH   . Nonspecific abnormal finding in stool contents   . Palpitations   . PTSD (post-traumatic stress disorder)   . PTSD (post-traumatic stress disorder)    [name]    Past Surgical History:  Procedure Laterality Date  . CHOLECYSTECTOMY  2004  . KNEE SURGERY Bilateral     There were no vitals filed for this visit.  Subjective Assessment - 02/26/18 1123    Subjective  My shoulder is better.  No pain at rest but continued pain with certain movements.    Patient Stated Goals  Use right shouler without pain.    Currently in Pain?  No/denies                      [facility] Adult PT Treatment/Exercise - 02/26/18 0001      Exercises   Exercises  Shoulder      Shoulder  Exercises: Standing   Other Standing Exercises  2# full can to fatigue x 2; ER with 2# at 45 degrees of right shoulder abduction to fatigue x 3 and isometric hold to failure.      Shoulder Exercises: ROM/Strengthening   UBE (Upper Arm Bike)  8 minutes.      Modalities   Modalities  Electrical Stimulation;Ultrasound      Acupuncturist Location  -- Right shoulder.    Electrical Stimulation Action  IFC    Electrical Stimulation Parameters  80-150 Hz x 20 minutes.    Electrical Stimulation Goals  Tone;Pain      Ultrasound   Ultrasound Location  Right shoulder.    Ultrasound Parameters  Combo e'stim/U/S at 1.50 W/CM2 x 10 minutes.    Ultrasound Goals  Pain                  PT Long Term Goals - 01/17/18 1257      PT LONG TERM GOAL #1   Title  Independent with a HEP.    Time  6    Period  Weeks    Status  New      PT LONG TERM GOAL #2   Title  Perform ADL's with right shoulder pain not > 1-2/10.    Time  6    Period  Weeks    Status  New      PT LONG TERM GOAL #3   Title  Sleep undisturbed without shoulder pain.    Time  6    Period  Weeks    Status  New      PT LONG TERM GOAL #4   Title  Negative Hawkins-Kennedy test to right shoulder.    Time  6    Period  Weeks    Status  New            Plan - 02/26/18 1129    Clinical Impression Statement  Patient reporting progress with less overall right shoulder pain than compared to when he strated treatment.  He does, however, have pain with certain movements.    PT Treatment/Interventions  ADLs/Self Care Home Management;Cryotherapy;Electrical Stimulation;Ultrasound;Moist Heat;Therapeutic exercise;Therapeutic activities;Patient/family education;Manual techniques;Dry needling    Consulted and Agree with Plan of Care  Patient       Patient will benefit from skilled therapeutic intervention in order to improve the following deficits and impairments:     Visit Diagnosis: Acute  pain of right shoulder  Abnormal posture     Problem List Patient Active Problem List   Diagnosis Date Noted  . Barrett's esophagus 01/03/2017  . Non-healing ulcer ([place]) 10/13/2015  . Neoplasm of uncertain behavior of skin 10/08/2015  . Skin lesion 09/07/2015  . Hypokalemia 03/03/2014  . BPH (benign prostatic hyperplasia) 03/03/2014  . Chest pain   . Palpitations   . Dyspnea on exertion   . ETOH abuse   . PTSD (post-traumatic stress disorder) 10/02/2013  . Abnormal stress test 08/29/2012  . GERD (gastroesophageal reflux disease)   . HTN (hypertension)   . Anxiety   . Depression   . Allergic rhinitis   . Diverticular disease   . Hemorrhoids   . Hiatal hernia   . Gout   . MACROCYTIC ANEMIA 08/09/2010  . NONSPEC ELEVATION OF LEVELS OF TRANSAMINASE/LDH 08/09/2010  . NONSPECIFIC ABNORMAL FINDING IN STOOL CONTENTS 08/09/2010    APPLEGATE, [place] MPT 02/26/2018, 11:32 AM  [facility] [address] [place], [place], [address] Phone: [phone]   Fax:  [phone]  Name: Joseph Larsen MRN: [id number] Date of Birth: 1946/11/17

## 2018-02-28 ENCOUNTER — Ambulatory Visit (INDEPENDENT_AMBULATORY_CARE_PROVIDER_SITE_OTHER): Payer: Medicare Other | Admitting: Family Medicine

## 2018-02-28 ENCOUNTER — Encounter: Payer: Self-pay | Admitting: Family Medicine

## 2018-02-28 ENCOUNTER — Encounter: Payer: Self-pay | Admitting: Physical Therapy

## 2018-02-28 ENCOUNTER — Ambulatory Visit (INDEPENDENT_AMBULATORY_CARE_PROVIDER_SITE_OTHER): Payer: Medicare Other

## 2018-02-28 ENCOUNTER — Ambulatory Visit: Payer: Medicare Other | Admitting: Physical Therapy

## 2018-02-28 VITALS — BP 142/77 | HR 57 | Temp 97.0°F | Ht 70.0 in | Wt 199.0 lb

## 2018-02-28 DIAGNOSIS — K22710 Barrett's esophagus with low grade dysplasia: Secondary | ICD-10-CM | POA: Diagnosis not present

## 2018-02-28 DIAGNOSIS — N400 Enlarged prostate without lower urinary tract symptoms: Secondary | ICD-10-CM | POA: Diagnosis not present

## 2018-02-28 DIAGNOSIS — R0602 Shortness of breath: Secondary | ICD-10-CM | POA: Diagnosis not present

## 2018-02-28 DIAGNOSIS — Z1283 Encounter for screening for malignant neoplasm of skin: Secondary | ICD-10-CM

## 2018-02-28 DIAGNOSIS — E538 Deficiency of other specified B group vitamins: Secondary | ICD-10-CM | POA: Diagnosis not present

## 2018-02-28 DIAGNOSIS — M109 Gout, unspecified: Secondary | ICD-10-CM

## 2018-02-28 DIAGNOSIS — R293 Abnormal posture: Secondary | ICD-10-CM | POA: Diagnosis not present

## 2018-02-28 DIAGNOSIS — I10 Essential (primary) hypertension: Secondary | ICD-10-CM

## 2018-02-28 DIAGNOSIS — M25511 Pain in right shoulder: Secondary | ICD-10-CM

## 2018-02-28 DIAGNOSIS — R0609 Other forms of dyspnea: Secondary | ICD-10-CM | POA: Diagnosis not present

## 2018-02-28 LAB — URINALYSIS, COMPLETE
Bilirubin, UA: NEGATIVE
Glucose, UA: NEGATIVE
KETONES UA: NEGATIVE
Nitrite, UA: NEGATIVE
PH UA: 8.5 — AB (ref 5.0–7.5)
PROTEIN UA: NEGATIVE
RBC, UA: NEGATIVE
SPEC GRAV UA: 1.015 (ref 1.005–1.030)
UUROB: 1 mg/dL (ref 0.2–1.0)

## 2018-02-28 LAB — MICROSCOPIC EXAMINATION
Bacteria, UA: NONE SEEN
EPITHELIAL CELLS (NON RENAL): NONE SEEN /HPF (ref 0–10)
Renal Epithel, UA: NONE SEEN /hpf

## 2018-02-28 MED ORDER — CYANOCOBALAMIN 1000 MCG/ML IJ SOLN: 1000.0000 ug | INTRAMUSCULAR | 11 refills | Status: DC

## 2018-02-28 NOTE — Patient Instructions (Addendum)
We will arrange for you to have an appointment with a dermatologist for a general dermatology evaluation at [facility] dermatology We will also arrange for you to have an appointment with orthopedic surgeon that comes to this office to evaluate the right shoulder pain Continue to walk regularly and exercise and drink plenty of fluids Use nasal saline frequently and use a coolmist humidifier in the bedroom at nighttime and keep the house as cool as possible Check blood pressure readings at home and bring these readings with you to the nurse to review in a couple of weeks and have her check your blood pressure here We will call with the uric acid results the potassium results and the B12 level results as soon as these results become available

## 2018-02-28 NOTE — Progress Notes (Signed)
Subjective:    Patient ID: Joseph Larsen, male    DOB: 09/30/1946, 72 y.o.   MRN: [id number]  HPI Patient presents to clinic today for a physical exam the patient is doing well overall and has no specific complaints and today does not need any refills.  He comes in today for a physical exam.  He has had blood work done and this will be reviewed with him during the visit today.  His PSA is good at 0.9.  The blood sugar renal and electrolytes are all good except the potassium is slightly decreased at 3.4.  All other liver function tests are normal.  The CBC has a normal white blood cell count with a good hemoglobin at 15.8 and a platelet count that remains slightly decreased at 142,000.  Cholesterol numbers with traditional lipid testing have an LDL C that is excellent at 69 triglycerides good at 68 and a good cholesterol that remains low as it has been in the past at 36.  The TSH, the most important thyroid test remains within normal limits.  Since the potassium was low we will recheck another BMP before the patient leaves the office today.  Patient is pleasant today and also gets his care at the [place].  They do not look at the blood work.  We will repeat the potassium get a B12 level and uric acid and make sure that he gets his B12 refill.  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  He has been having some trouble with his right shoulder and this was going on for about 4 months after working on a seek and putting his shoulder in an awkward position.  It hurts when he moves it in certain directions.  He denies any chest pain pressure or tightness.  He denies any shortness of breath.  He denies any trouble with swallowing heartburn indigestion nausea vomiting diarrhea blood in the stool or black tarry bowel movements.  He is not had any change in his bowel habits.  He is passing his water without problems.  In addition to repeating the lab work today he will get an EKG  and a chest x-ray.   Review of Systems  Constitutional: Negative for activity change, appetite change, chills, diaphoresis, fatigue, fever and unexpected weight change.  HENT: Negative for congestion, dental problem, drooling, ear discharge, ear pain, facial swelling, hearing loss, mouth sores, nosebleeds, postnasal drip, rhinorrhea, sinus pressure, sinus pain, sneezing, sore throat, tinnitus, trouble swallowing and voice change.   Eyes: Negative for photophobia, pain, discharge, redness, itching and visual disturbance.  Respiratory: Negative for apnea, cough, choking, chest tightness, shortness of breath, wheezing and stridor.   Cardiovascular: Positive for leg swelling. Negative for chest pain.  Gastrointestinal: Negative for abdominal distention, abdominal pain, anal bleeding, blood in stool, constipation, diarrhea, nausea, rectal pain and vomiting.  Endocrine: Negative for cold intolerance, heat intolerance, polydipsia, polyphagia and polyuria.  Genitourinary: Negative for decreased urine volume, difficulty urinating, discharge, dysuria, enuresis, flank pain, frequency, genital sores, hematuria, penile pain, penile swelling, scrotal swelling, testicular pain and urgency.  Musculoskeletal: Negative for arthralgias, back pain, gait problem, joint swelling, myalgias, neck pain and neck stiffness.  Skin: Negative for color change, pallor, rash and wound.  Allergic/Immunologic: Negative for environmental allergies, food allergies and immunocompromised state.  Neurological: Negative for dizziness, tremors, seizures, syncope, facial asymmetry, speech difficulty, weakness, light-headedness, numbness and headaches.  Hematological: Negative for adenopathy. Does not bruise/bleed  easily.  Psychiatric/Behavioral: Negative for agitation, behavioral problems, confusion, decreased concentration, dysphoric mood, hallucinations, self-injury, sleep disturbance and suicidal ideas. The patient is not nervous/anxious  and is not hyperactive.        Objective:   Physical Exam  Constitutional: He is oriented to person, place, and time. He appears well-developed and well-nourished. No distress.  The patient is pleasant calm and relaxed  HENT:  Head: Normocephalic and atraumatic.  Right Ear: External ear normal.  Left Ear: External ear normal.  Nose: Nose normal.  Mouth/Throat: Oropharynx is clear and moist. No oropharyngeal exudate.  Eyes: Conjunctivae and EOM are normal. Pupils are equal, round, and reactive to light. Right eye exhibits no discharge. Left eye exhibits no discharge. No scleral icterus.  Neck: Normal range of motion. Neck supple. No thyromegaly present.  No bruits thyromegaly or anterior cervical adenopathy  Cardiovascular: Normal rate, regular rhythm, normal heart sounds and intact distal pulses.  No murmur heard. Pulmonary/Chest: Effort normal and breath sounds normal. No respiratory distress. He has no wheezes. He has no rales. He exhibits no tenderness.  Clear anteriorly and posteriorly and no chest wall masses or axillary adenopathy  Abdominal: Soft. Bowel sounds are normal. He exhibits no mass. There is no tenderness. There is no rebound and no guarding.  No liver or spleen enlargement epigastric tenderness masses bruits or inguinal adenopathy  Genitourinary: Rectum normal and penis normal.  Genitourinary Comments: The prostate is slightly enlarged but soft and smooth.  There were no rectal masses.  External genitalia were within normal limits with no inguinal hernias being palpated.  Musculoskeletal: Normal range of motion. He exhibits tenderness. He exhibits no edema.  There was some right posterior shoulder tenderness with abduction of the shoulder.  Lymphadenopathy:    He has no cervical adenopathy.  Neurological: He is alert and oriented to person, place, and time. He has normal reflexes. No cranial nerve deficit.  Skin: Skin is warm and dry. No rash noted.  Areas of actinic  keratoses on the right hand.  Psychiatric: He has a normal mood and affect. His behavior is normal. Judgment and thought content normal.  Nursing note and vitals reviewed.  BP (!) 142/77   Pulse (!) 57   Temp (!) 97 F (36.1 C) (Oral)   Ht 5\' 10"  (1.778 m)   Wt 199 lb (90.3 kg)   BMI 28.55 kg/m   Repeat blood pressure in the right arm sitting was 148/90 with a large cuff.     Assessment & Plan:  1. B12 deficiency -Continue with B12 injections and we will let you know the results of the B12 level as soon as that is returned and the blood work that we draw the day - Vitamin B12  2. Essential hypertension -Blood pressure remains slightly elevated.  The patient should be encouraged to watch his sodium intake more closely. - Urinalysis, Complete - Basic Metabolic Panel  3. Dyspnea on exertion -He exercises regularly and this does not seem to be a real issue with him. - DG Chest 2 View; Future - EKG 12-Lead  4. Gout, unspecified cause, unspecified chronicity, unspecified site -He is still on allopurinol and we will check a uric acid today. - Uric acid  5. Right shoulder pain, unspecified chronicity -Appointment with orthopedics  6. Benign prostatic hyperplasia, unspecified whether lower urinary tract symptoms present -No symptoms with passing his water  7. Barrett's esophagus with low grade dysplasia -We need to make sure that he gets regular follow-up with  a gastroenterologist for endoscopies.  No orders of the defined types were placed in this encounter.  Patient Instructions  We will arrange for you to have an appointment with a dermatologist for a general dermatology evaluation at [facility] dermatology We will also arrange for you to have an appointment with orthopedic surgeon that comes to this office to evaluate the right shoulder pain Continue to walk regularly and exercise and drink plenty of fluids Use nasal saline frequently and use a coolmist humidifier in the  bedroom at nighttime and keep the house as cool as possible Check blood pressure readings at home and bring these readings with you to the nurse to review in a couple of weeks and have her check your blood pressure here We will call with the uric acid results the potassium results and the B12 level results as soon as these results become available   Arrie Senate MD

## 2018-02-28 NOTE — Addendum Note (Signed)
Addended by: Rolena Infante on: 02/28/2018 04:08 PM   Modules accepted: Orders

## 2018-02-28 NOTE — Therapy (Addendum)
[facility] [place], [place], [address] Phone: [phone]   Fax:  [phone]  Physical Therapy Treatment  Patient Details  Name: PASQUAL FARIAS MRN: [id number] Date of Birth: 08-Dec-1946 Referring Provider: Kenn File MD   Encounter Date: 02/28/2018  PT End of Session - 02/28/18 0955    Visit Number  10    Number of Visits  12    Date for PT Re-Evaluation  02/28/18    Authorization Type  FOTO AT LEAST EVERY 5TH VISIT.    PT Start Time  0901    PT Stop Time  0956    PT Time Calculation (min)  55 min    Activity Tolerance  Patient tolerated treatment well    Behavior During Therapy  [facility] for tasks assessed/performed       Past Medical History:  Diagnosis Date  . Abnormal ECG    a. 08/2012 Abnl ETT with ST changes in recovery;  b. 08/2012 Ex MV, ex time 10:00, EF 66%, no ischemia/infarct.  . Allergic rhinitis   . Anxiety   . Cataract   . Depression   . Diverticular disease   . Dyspnea on exertion   . ETOH abuse   . GERD (gastroesophageal reflux disease)   . Gout   . Hemorrhoids   . Hiatal hernia   . HTN (hypertension)   . Low testosterone   . MACROCYTIC ANEMIA   . NONSPEC ELEVATION OF LEVELS OF TRANSAMINASE/LDH   . Nonspecific abnormal finding in stool contents   . Palpitations   . PTSD (post-traumatic stress disorder)   . PTSD (post-traumatic stress disorder)    [name]    Past Surgical History:  Procedure Laterality Date  . CHOLECYSTECTOMY  2004  . KNEE SURGERY Bilateral     There were no vitals filed for this visit.  Subjective Assessment - 02/28/18 0937    Subjective  My shoulder is better.  No pain at rest but continued pain with certain movements.    Pain Score  1     Pain Orientation  Right    Pain Onset  More than a month ago                      [facility] Adult PT Treatment/Exercise - 02/28/18 0001      Shoulder Exercises: ROM/Strengthening   UBE (Upper Arm  Bike)  8 minutes at 60 RPM's.      Modalities   Modalities  Electrical Stimulation;Ultrasound      Acupuncturist Location  -- Right shoulder.    Electrical Stimulation Action  IFC    Electrical Stimulation Parameters  80-150 Hz x 20 minutes.    Electrical Stimulation Goals  Tone;Pain      Ultrasound   Ultrasound Location  -- Right shoulder.    Ultrasound Parameters  Combo e'stim/U/S at 1.50 W/CM2 x 10 minutes.    Ultrasound Goals  Pain      Manual Therapy   Manual therapy comments  In supine:  PROM and capsular stretching to patient's right shoulder with emphasis  on ER x 7 minutes.                  PT Long Term Goals - 01/17/18 1257      PT LONG TERM GOAL #1   Title  Independent with a HEP.    Time  6    Period  Weeks  Status  New      PT LONG TERM GOAL #2   Title  Perform ADL's with right shoulder pain not > 1-2/10.    Time  6    Period  Weeks    Status  New      PT LONG TERM GOAL #3   Title  Sleep undisturbed without shoulder pain.    Time  6    Period  Weeks    Status  New      PT LONG TERM GOAL #4   Title  Negative Hawkins-Kennedy test to right shoulder.    Time  6    Period  Weeks    Status  New            Plan - 02/28/18 0954    Clinical Impression Statement  Doing well.  Seeing the doctor today.    Clinical Decision Making  Low    PT Treatment/Interventions  ADLs/Self Care Home Management;Cryotherapy;Electrical Stimulation;Ultrasound;Moist Heat;Therapeutic exercise;Therapeutic activities;Patient/family education;Manual techniques;Dry needling    PT Next Visit Plan  Combo e'stim/U/S to right infraspinatus f/b IASTM; RW4, SDLY ER.      Consulted and Agree with Plan of Care  Patient       Patient will benefit from skilled therapeutic intervention in order to improve the following deficits and impairments:     Visit Diagnosis: Acute pain of right shoulder     Problem List Patient Active Problem  List   Diagnosis Date Noted  . Barrett's esophagus 01/03/2017  . Non-healing ulcer ([place]) 10/13/2015  . Neoplasm of uncertain behavior of skin 10/08/2015  . Skin lesion 09/07/2015  . Hypokalemia 03/03/2014  . BPH (benign prostatic hyperplasia) 03/03/2014  . Chest pain   . Palpitations   . Dyspnea on exertion   . ETOH abuse   . PTSD (post-traumatic stress disorder) 10/02/2013  . Abnormal stress test 08/29/2012  . GERD (gastroesophageal reflux disease)   . HTN (hypertension)   . Anxiety   . Depression   . Allergic rhinitis   . Diverticular disease   . Hemorrhoids   . Hiatal hernia   . Gout   . MACROCYTIC ANEMIA 08/09/2010  . NONSPEC ELEVATION OF LEVELS OF TRANSAMINASE/LDH 08/09/2010  . NONSPECIFIC ABNORMAL FINDING IN STOOL CONTENTS 08/09/2010    Anav Lammert, [place] MPT 02/28/2018, 10:02 AM  [facility] [address] [place], [place], [address] Phone: [phone]   Fax:  [phone]  Name: NICKI GRACY MRN: [id number] Date of Birth: February 23, 1946  PHYSICAL THERAPY DISCHARGE SUMMARY  Visits from Start of Care: 10.  Current functional level related to goals / functional outcomes: See above.   Remaining deficits: Continued right shoulder pain.   Education / Equipment: HEP. Plan: Patient agrees to discharge.  Patient goals were not met. Patient is being discharged due to lack of progress.  ?????          [place] Arti Trang MPT

## 2018-03-01 ENCOUNTER — Other Ambulatory Visit (INDEPENDENT_AMBULATORY_CARE_PROVIDER_SITE_OTHER): Payer: Medicare Other

## 2018-03-01 ENCOUNTER — Other Ambulatory Visit: Payer: Self-pay | Admitting: Orthopedic Surgery

## 2018-03-01 ENCOUNTER — Encounter: Payer: Self-pay | Admitting: Family Medicine

## 2018-03-01 DIAGNOSIS — R52 Pain, unspecified: Secondary | ICD-10-CM

## 2018-03-01 DIAGNOSIS — M25511 Pain in right shoulder: Secondary | ICD-10-CM | POA: Diagnosis not present

## 2018-03-01 DIAGNOSIS — I700 Atherosclerosis of aorta: Secondary | ICD-10-CM | POA: Insufficient documentation

## 2018-03-01 DIAGNOSIS — M7541 Impingement syndrome of right shoulder: Secondary | ICD-10-CM | POA: Diagnosis not present

## 2018-03-01 LAB — BASIC METABOLIC PANEL
BUN / CREAT RATIO: 16 (ref 10–24)
BUN: 13 mg/dL (ref 8–27)
CHLORIDE: 98 mmol/L (ref 96–106)
CO2: 27 mmol/L (ref 20–29)
Calcium: 9.2 mg/dL (ref 8.6–10.2)
Creatinine, Ser: 0.79 mg/dL (ref 0.76–1.27)
GFR calc Af Amer: 105 mL/min/{1.73_m2} (ref >59)
GFR calc non Af Amer: 91 mL/min/{1.73_m2} (ref >59)
Glucose: 79 mg/dL (ref 65–99)
Potassium: 3.7 mmol/L (ref 3.5–5.2)
SODIUM: 141 mmol/L (ref 134–144)

## 2018-03-01 LAB — URIC ACID: URIC ACID: 3.4 mg/dL — AB (ref 3.7–8.6)

## 2018-03-01 LAB — VITAMIN B12: Vitamin B-12: 472 pg/mL (ref 232–1245)

## 2018-03-01 MED ORDER — ROSUVASTATIN CALCIUM 5 MG PO TABS: ORAL_TABLET | ORAL | 3 refills | Status: DC

## 2018-03-01 NOTE — Addendum Note (Signed)
Addended by: Rolena Infante on: 03/01/2018 09:18 AM   Modules accepted: Orders

## 2018-03-01 NOTE — Progress Notes (Signed)
Med sent to pharm 

## 2018-03-27 DIAGNOSIS — K2940 Chronic atrophic gastritis without bleeding: Secondary | ICD-10-CM | POA: Diagnosis not present

## 2018-03-27 DIAGNOSIS — K449 Diaphragmatic hernia without obstruction or gangrene: Secondary | ICD-10-CM | POA: Diagnosis not present

## 2018-03-27 DIAGNOSIS — I10 Essential (primary) hypertension: Secondary | ICD-10-CM | POA: Diagnosis not present

## 2018-03-27 DIAGNOSIS — K3189 Other diseases of stomach and duodenum: Secondary | ICD-10-CM | POA: Diagnosis not present

## 2018-03-27 DIAGNOSIS — K219 Gastro-esophageal reflux disease without esophagitis: Secondary | ICD-10-CM | POA: Diagnosis not present

## 2018-04-26 ENCOUNTER — Other Ambulatory Visit: Payer: Medicare Other

## 2018-04-26 ENCOUNTER — Encounter: Payer: Self-pay | Admitting: Family Medicine

## 2018-04-26 ENCOUNTER — Ambulatory Visit (INDEPENDENT_AMBULATORY_CARE_PROVIDER_SITE_OTHER): Payer: Medicare Other | Admitting: Family Medicine

## 2018-04-26 VITALS — BP 135/75 | HR 62 | Temp 98.2°F | Ht 70.0 in | Wt 194.0 lb

## 2018-04-26 DIAGNOSIS — R0789 Other chest pain: Secondary | ICD-10-CM | POA: Diagnosis not present

## 2018-04-26 DIAGNOSIS — R002 Palpitations: Secondary | ICD-10-CM | POA: Diagnosis not present

## 2018-04-26 DIAGNOSIS — I700 Atherosclerosis of aorta: Secondary | ICD-10-CM | POA: Diagnosis not present

## 2018-04-26 DIAGNOSIS — K219 Gastro-esophageal reflux disease without esophagitis: Secondary | ICD-10-CM | POA: Diagnosis not present

## 2018-04-26 DIAGNOSIS — N400 Enlarged prostate without lower urinary tract symptoms: Secondary | ICD-10-CM | POA: Diagnosis not present

## 2018-04-26 DIAGNOSIS — R6889 Other general symptoms and signs: Secondary | ICD-10-CM | POA: Diagnosis not present

## 2018-04-26 DIAGNOSIS — K22710 Barrett's esophagus with low grade dysplasia: Secondary | ICD-10-CM | POA: Diagnosis not present

## 2018-04-26 DIAGNOSIS — I10 Essential (primary) hypertension: Secondary | ICD-10-CM

## 2018-04-26 DIAGNOSIS — R319 Hematuria, unspecified: Secondary | ICD-10-CM

## 2018-04-26 DIAGNOSIS — Z0000 Encounter for general adult medical examination without abnormal findings: Secondary | ICD-10-CM

## 2018-04-26 DIAGNOSIS — E538 Deficiency of other specified B group vitamins: Secondary | ICD-10-CM

## 2018-04-26 LAB — URINALYSIS, COMPLETE
BILIRUBIN UA: NEGATIVE
GLUCOSE, UA: NEGATIVE
LEUKOCYTES UA: NEGATIVE
Nitrite, UA: NEGATIVE
PROTEIN UA: NEGATIVE
RBC UA: NEGATIVE
SPEC GRAV UA: 1.02 (ref 1.005–1.030)
Urobilinogen, Ur: 1 mg/dL (ref 0.2–1.0)
pH, UA: 6.5 (ref 5.0–7.5)

## 2018-04-26 LAB — MICROSCOPIC EXAMINATION
Bacteria, UA: NONE SEEN
EPITHELIAL CELLS (NON RENAL): NONE SEEN /HPF (ref 0–10)
RBC, UA: NONE SEEN /hpf (ref 0–2)
Renal Epithel, UA: NONE SEEN /hpf

## 2018-04-26 MED ORDER — ROSUVASTATIN CALCIUM 5 MG PO TABS: ORAL_TABLET | ORAL | 3 refills | Status: DC

## 2018-04-26 MED ORDER — CYANOCOBALAMIN 1000 MCG/ML IJ SOLN
1000.0000 ug | INTRAMUSCULAR | Status: AC
  Administered 2018-04-26 – 2018-12-31 (×3): 1000 ug via INTRAMUSCULAR

## 2018-04-26 NOTE — Addendum Note (Signed)
Addended by: Zannie Cove on: 04/26/2018 10:28 AM   Modules accepted: Orders

## 2018-04-26 NOTE — Progress Notes (Signed)
Subjective:    Patient ID: Joseph Larsen, male    DOB: [birth year], 72 y.o.   MRN: [id number]  HPI Patient here today for some recent chest tightness and palpitations.  This is been going on for a couple of weeks and he says there is no pain but there is just a funny sensation in his chest and it seems to be worse at night and when he gets up in the morning.  Sometimes this has awakened him during the night.  May be slightly more shortness of breath but not that much more.  No nausea and vomiting and his last endoscopy was normal.  He is passing his water without problems.  Chest x-ray as mentioned was stable from previous readings and this was last done in March of this year.  The patient did have a stress Myoview in 2015 which was read as normal with a good ejection fraction.   Patient Active Problem List   Diagnosis Date Noted  . Aortic atherosclerosis ([place]) 03/01/2018  . Barrett's esophagus 01/03/2017  . Non-healing ulcer ([place]) 10/13/2015  . Neoplasm of uncertain behavior of skin 10/08/2015  . Skin lesion 09/07/2015  . Hypokalemia 03/03/2014  . BPH (benign prostatic hyperplasia) 03/03/2014  . Chest pain   . Palpitations   . Dyspnea on exertion   . ETOH abuse   . PTSD (post-traumatic stress disorder) 10/02/2013  . Abnormal stress test 08/29/2012  . GERD (gastroesophageal reflux disease)   . HTN (hypertension)   . Anxiety   . Depression   . Allergic rhinitis   . Diverticular disease   . Hemorrhoids   . Hiatal hernia   . Gout   . MACROCYTIC ANEMIA 08/09/2010  . NONSPEC ELEVATION OF LEVELS OF TRANSAMINASE/LDH 08/09/2010  . NONSPECIFIC ABNORMAL FINDING IN STOOL CONTENTS 08/09/2010   Outpatient Encounter Medications as of 04/26/2018  Medication Sig  . allopurinol (ZYLOPRIM) 300 MG tablet Take 300 mg by mouth daily.  Marland Kitchen amLODipine (NORVASC) 10 MG tablet Take 5 mg by mouth daily.  Marland Kitchen aspirin 81 MG tablet Take 81 mg by mouth daily.  Marland Kitchen atenolol (TENORMIN) 100 MG tablet Take 50 mg by  mouth daily.  Marland Kitchen buPROPion (WELLBUTRIN SR) 200 MG 12 hr tablet Take 200 mg by mouth 2 (two) times daily.  . cholecalciferol (VITAMIN D) 1000 UNITS tablet Take 1,000 Units by mouth daily.  . cyanocobalamin (,VITAMIN B-12,) 1000 MCG/ML injection Inject 1 mL (1,000 mcg total) into the muscle every 30 (thirty) days.  . cyanocobalamin (,VITAMIN B-12,) 1000 MCG/ML injection Inject 1 mL (1,000 mcg total) into the muscle every 30 (thirty) days for 12 doses.  . diazepam (VALIUM) 10 MG tablet Take 1 tablet (10 mg total) by mouth every 8 (eight) hours as needed for anxiety. (Patient taking differently: Take 10 mg by mouth every 12 (twelve) hours as needed for anxiety. )  . flunisolide (NASALIDE) 0.025 % SOLN Inhale 2 sprays into the lungs as needed.  . hydrochlorothiazide (HYDRODIURIL) 25 MG tablet Take 12.5 mg by mouth daily.  . Misc Natural Products (OSTEO BI-FLEX/5-LOXIN ADVANCED PO) Take 2 tablets by mouth.  . Omega-3 Fatty Acids (FISH OIL) 1000 MG CAPS Take 4 capsules by mouth daily.   Marland Kitchen omeprazole (PRILOSEC) 20 MG capsule Take 40 mg by mouth 2 (two) times daily before a meal.   . QUEtiapine (SEROQUEL) 50 MG tablet Take 25 mg by mouth at bedtime.  . rosuvastatin (CRESTOR) 5 MG tablet Take 0.5 tab twice weekly  . valsartan (  DIOVAN) 320 MG tablet Take 160 mg by mouth daily.   Facility-Administered Encounter Medications as of 04/26/2018  Medication  . cyanocobalamin ((VITAMIN B-12)) injection 1,000 mcg      Review of Systems  Constitutional: Negative.   HENT: Negative.   Eyes: Negative.   Respiratory: Negative.   Cardiovascular: Positive for palpitations. Chest pain: tightness.  Gastrointestinal: Negative.   Endocrine: Negative.   Genitourinary: Negative.   Musculoskeletal: Negative.   Skin: Negative.   Allergic/Immunologic: Negative.   Neurological: Positive for weakness.  Hematological: Negative.   Psychiatric/Behavioral: Negative.        Objective:   Physical Exam  Constitutional: He  is oriented to person, place, and time. He appears well-developed and well-nourished. No distress.  Pleasant calm and alert and in no acute distress  HENT:  Head: Normocephalic and atraumatic.  Right Ear: External ear normal.  Left Ear: External ear normal.  Nose: Nose normal.  Mouth/Throat: Oropharynx is clear and moist. No oropharyngeal exudate.  Eyes: Pupils are equal, round, and reactive to light. Conjunctivae and EOM are normal. Right eye exhibits no discharge. Left eye exhibits no discharge. No scleral icterus.  Neck: Normal range of motion. Neck supple. No tracheal deviation present. No thyromegaly present.  Cardiovascular: Normal rate, regular rhythm, normal heart sounds, intact distal pulses and normal pulses. Exam reveals no gallop and no friction rub.  No murmur heard. 72/min  Pulmonary/Chest: Effort normal and breath sounds normal. No respiratory distress. He has no wheezes. He has no rales. He exhibits no tenderness.  Abdominal: Soft. Bowel sounds are normal. He exhibits no mass. There is no tenderness. There is no guarding.  Genitourinary: Rectum normal, prostate normal and penis normal.  Musculoskeletal: Normal range of motion. He exhibits no edema or tenderness.       Right lower leg: Normal.       Left lower leg: Normal.  Lymphadenopathy:    He has no cervical adenopathy.  Neurological: He is alert and oriented to person, place, and time. He has normal reflexes. No cranial nerve deficit.  Skin: Skin is warm and dry. No rash noted.  Psychiatric: He has a normal mood and affect. His behavior is normal. Judgment and thought content normal.  Nursing note and vitals reviewed.         Assessment & Plan:  1. Essential hypertension -Blood pressure is good today and EKG was within normal limits. - EKG 12-Lead  2. Chest tightness -Lungs are clear anteriorly and posteriorly and no chest wall sensitivity or tenderness. - EKG 12-Lead - Holter monitor - 48 hour; Future  3.  Vitamin B 12 deficiency - DME Other see comment  4. Barrett's esophagus with low grade dysplasia -Patient has had several endoscopies and this appears to be stable.  5. Aortic atherosclerosis ([place]) -Continue with aggressive therapeutic lifestyle changes - Holter monitor - 48 hour; Future  6. Palpitations -48-hour monitor and future visit with his cardiologist Dr. Johnsie Cancel - Holter monitor - 48 hour; Future -CBC BMP and thyroid profile  Patient Instructions  Continue to watch caffeine intake We will call with thyroid CBC and BMP results as soon as those results become available We will look you up with a 48-hour monitor and you will correlate any symptoms you are having with the history associated with them monitor so that we can see if any arrhythmias are occurring when you have symptoms We will arrange for you to have an appointment with the cardiologist that has seen you in the past  The EKG today was within normal limits. A chest x-ray in March showed aortic atherosclerosis but stable cardiopulmonary findings compared to previous readings from 4 years ago.  Arrie Senate MD

## 2018-04-26 NOTE — Addendum Note (Signed)
Addended by: Zannie Cove on: 04/26/2018 02:12 PM   Modules accepted: Orders

## 2018-04-26 NOTE — Addendum Note (Signed)
Addended by: Zannie Cove on: 04/26/2018 02:30 PM   Modules accepted: Orders

## 2018-04-26 NOTE — Addendum Note (Signed)
Addended by: Zannie Cove on: 04/26/2018 02:27 PM   Modules accepted: Orders

## 2018-04-26 NOTE — Addendum Note (Signed)
Addended by: Marin Olp on: 04/26/2018 03:15 PM   Modules accepted: Orders

## 2018-04-26 NOTE — Patient Instructions (Addendum)
Continue to watch caffeine intake We will call with thyroid CBC and BMP results as soon as those results become available We will look you up with a 48-hour monitor and you will correlate any symptoms you are having with the history associated with them monitor so that we can see if any arrhythmias are occurring when you have symptoms We will arrange for you to have an appointment with the cardiologist that has seen you in the past The EKG today was within normal limits. A chest x-ray in March showed aortic atherosclerosis but stable cardiopulmonary findings compared to previous readings from 4 years ago.

## 2018-04-26 NOTE — Addendum Note (Signed)
Addended by: Zannie Cove on: 04/26/2018 03:29 PM   Modules accepted: Orders

## 2018-04-27 LAB — CBC WITH DIFFERENTIAL/PLATELET
BASOS: 1 %
Basophils Absolute: 0.1 10*3/uL (ref 0.0–0.2)
EOS (ABSOLUTE): 0.1 10*3/uL (ref 0.0–0.4)
EOS: 1 %
Hematocrit: 43.6 % (ref 37.5–51.0)
Hemoglobin: 15.3 g/dL (ref 13.0–17.7)
Immature Grans (Abs): 0 10*3/uL (ref 0.0–0.1)
Immature Granulocytes: 0 %
LYMPHS: 38 %
Lymphocytes Absolute: 2.6 10*3/uL (ref 0.7–3.1)
MCH: 34.2 pg — AB (ref 26.6–33.0)
MCHC: 35.1 g/dL (ref 31.5–35.7)
MCV: 98 fL — AB (ref 79–97)
MONOCYTES: 8 %
MONOS ABS: 0.5 10*3/uL (ref 0.1–0.9)
NEUTROS ABS: 3.5 10*3/uL (ref 1.4–7.0)
Neutrophils: 52 %
PLATELETS: 173 10*3/uL (ref 150–379)
RBC: 4.47 x10E6/uL (ref 4.14–5.80)
RDW: 14 % (ref 12.3–15.4)
WBC: 6.7 10*3/uL (ref 3.4–10.8)

## 2018-04-27 LAB — BMP8+EGFR
BUN/Creatinine Ratio: 16 (ref 10–24)
BUN: 13 mg/dL (ref 8–27)
CALCIUM: 9.1 mg/dL (ref 8.6–10.2)
CO2: 26 mmol/L (ref 20–29)
Chloride: 99 mmol/L (ref 96–106)
Creatinine, Ser: 0.8 mg/dL (ref 0.76–1.27)
GFR, EST AFRICAN AMERICAN: 105 mL/min/{1.73_m2} (ref >59)
GFR, EST NON AFRICAN AMERICAN: 91 mL/min/{1.73_m2} (ref >59)
Glucose: 82 mg/dL (ref 65–99)
POTASSIUM: 3.6 mmol/L (ref 3.5–5.2)
Sodium: 140 mmol/L (ref 134–144)

## 2018-04-27 LAB — HEPATIC FUNCTION PANEL
ALBUMIN: 4.5 g/dL (ref 3.5–4.8)
ALK PHOS: 49 IU/L (ref 39–117)
ALT: 31 IU/L (ref 0–44)
AST: 30 IU/L (ref 0–40)
BILIRUBIN, DIRECT: 0.2 mg/dL (ref 0.00–0.40)
Bilirubin Total: 0.5 mg/dL (ref 0.0–1.2)
TOTAL PROTEIN: 6.5 g/dL (ref 6.0–8.5)

## 2018-04-27 LAB — LIPID PANEL
CHOLESTEROL TOTAL: 96 mg/dL — AB (ref 100–199)
Chol/HDL Ratio: 2.5 ratio (ref 0.0–5.0)
HDL: 38 mg/dL — AB (ref >39)
LDL Calculated: 46 mg/dL (ref 0–99)
Triglycerides: 60 mg/dL (ref 0–149)
VLDL CHOLESTEROL CAL: 12 mg/dL (ref 5–40)

## 2018-04-27 LAB — URINE CULTURE: Organism ID, Bacteria: NO GROWTH

## 2018-04-27 LAB — VITAMIN D 25 HYDROXY (VIT D DEFICIENCY, FRACTURES): VIT D 25 HYDROXY: 80.8 ng/mL (ref 30.0–100.0)

## 2018-04-28 LAB — THYROID PANEL WITH TSH
FREE THYROXINE INDEX: 1.7 (ref 1.2–4.9)
T3 Uptake Ratio: 26 % (ref 24–39)
T4, Total: 6.7 ug/dL (ref 4.5–12.0)
TSH: 1.42 u[IU]/mL (ref 0.450–4.500)

## 2018-04-28 LAB — SPECIMEN STATUS REPORT

## 2018-04-30 DIAGNOSIS — R002 Palpitations: Secondary | ICD-10-CM | POA: Diagnosis not present

## 2018-05-02 DIAGNOSIS — H40033 Anatomical narrow angle, bilateral: Secondary | ICD-10-CM | POA: Diagnosis not present

## 2018-05-02 DIAGNOSIS — H04123 Dry eye syndrome of bilateral lacrimal glands: Secondary | ICD-10-CM | POA: Diagnosis not present

## 2018-05-02 LAB — VITAMIN B12: Vitamin B-12: 503 pg/mL (ref 232–1245)

## 2018-05-02 LAB — SPECIMEN STATUS REPORT

## 2018-05-03 ENCOUNTER — Encounter: Payer: Self-pay | Admitting: Cardiovascular Disease

## 2018-05-03 ENCOUNTER — Ambulatory Visit (INDEPENDENT_AMBULATORY_CARE_PROVIDER_SITE_OTHER): Payer: Medicare Other | Admitting: Cardiovascular Disease

## 2018-05-03 VITALS — BP 142/84 | HR 78 | Ht 71.0 in | Wt 190.0 lb

## 2018-05-03 DIAGNOSIS — R002 Palpitations: Secondary | ICD-10-CM | POA: Diagnosis not present

## 2018-05-03 DIAGNOSIS — R079 Chest pain, unspecified: Secondary | ICD-10-CM

## 2018-05-03 NOTE — Progress Notes (Signed)
Cardiology Office Note   Date:  05/03/2018   ID:  AAKASH HOLLOMON, DOB Oct 05, 1946, MRN [id number]  PCP:  Chipper Herb, MD  Cardiologist:   Jenkins Rouge, MD   No chief complaint on file.     History of Present Illness: Joseph Larsen is a 72 y.o. male who presents for consultation regarding chest pain and palpitations. Referred by [facility] Dr Laurance Flatten Last seen by cardiology over 4 years ago in 2015. Previous smoker with HTN.  History of ETOH abuse beer and peach brandy History of abnormal ECG with normal myovue in 2013. Previous palpitations with holder 2015 showing only PAC;s. 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 Just turned his Holter Into [facility] yesterday    Past Medical History:  Diagnosis Date  . Abnormal ECG    a. 08/2012 Abnl ETT with ST changes in recovery;  b. 08/2012 Ex MV, ex time 10:00, EF 66%, no ischemia/infarct.  . Allergic rhinitis   . Anxiety   . Cataract   . Depression   . Diverticular disease   . Dyspnea on exertion   . ETOH abuse   . GERD (gastroesophageal reflux disease)   . Gout   . Hemorrhoids   . Hiatal hernia   . HTN (hypertension)   . Low testosterone   . MACROCYTIC ANEMIA   . NONSPEC ELEVATION OF LEVELS OF TRANSAMINASE/LDH   . Nonspecific abnormal finding in stool contents   . Palpitations   . PTSD (post-traumatic stress disorder)   . PTSD (post-traumatic stress disorder)    [name]    Past Surgical History:  Procedure Laterality Date  . CHOLECYSTECTOMY  2004  . KNEE SURGERY Bilateral      Current Outpatient Medications  Medication Sig Dispense Refill  . allopurinol (ZYLOPRIM) 300 MG tablet  Take 300 mg by mouth daily.    Marland Kitchen amLODipine (NORVASC) 10 MG tablet Take 5 mg by mouth daily.    Marland Kitchen aspirin 81 MG tablet Take 81 mg by mouth daily.    Marland Kitchen atenolol (TENORMIN) 100 MG tablet Take 50 mg by mouth daily.    Marland Kitchen buPROPion (WELLBUTRIN SR) 200 MG 12 hr tablet Take 200 mg by mouth 2 (two) times daily.    . cholecalciferol (VITAMIN D) 1000 UNITS tablet Take 1,000 Units by mouth daily.    . cyanocobalamin (,VITAMIN B-12,) 1000 MCG/ML injection Inject 1 mL (1,000 mcg total) into the muscle every 30 (thirty) days. 3 mL 0  . cyanocobalamin (,VITAMIN B-12,) 1000 MCG/ML injection Inject 1 mL (1,000 mcg total) into the muscle every 30 (thirty) days for 12 doses. 1 mL 11  . diazepam (VALIUM) 10 MG tablet Take 1 tablet (10 mg total) by mouth every 8 (eight) hours as needed for anxiety. (Patient taking differently: Take 10 mg by mouth every 12 (twelve) hours as needed for anxiety. ) 90 tablet 1  . flunisolide (NASALIDE) 0.025 % SOLN Inhale 2 sprays into the lungs as needed.    . hydrochlorothiazide (HYDRODIURIL) 25 MG tablet Take 12.5 mg by mouth daily.    . Misc Natural Products (OSTEO BI-FLEX/5-LOXIN  ADVANCED PO) Take 2 tablets by mouth.    . Omega-3 Fatty Acids (FISH OIL) 1000 MG CAPS Take 4 capsules by mouth daily.     Marland Kitchen omeprazole (PRILOSEC) 20 MG capsule Take 40 mg by mouth 2 (two) times daily before a meal.     . QUEtiapine (SEROQUEL) 50 MG tablet Take 25 mg by mouth at bedtime.    . rosuvastatin (CRESTOR) 5 MG tablet Take 0.5 tab po three times weekly as directed 20 tablet 3  . valsartan (DIOVAN) 320 MG tablet Take 160 mg by mouth daily.     Current Facility-Administered Medications  Medication Dose Route Frequency Provider Last Rate Last Dose  . cyanocobalamin ((VITAMIN B-12)) injection 1,000 mcg  1,000 mcg Intramuscular Q30 days Timmothy Euler, MD   1,000 mcg at 02/13/18 0932  . cyanocobalamin ((VITAMIN B-12)) injection 1,000 mcg  1,000 mcg Intramuscular Q30 days Chipper Herb, MD   1,000  mcg at 04/26/18 1427    Allergies:   Enalapril maleate; Penicillins; Hytrin [terazosin]; and Zestril [lisinopril]    Social History:  The patient  reports that he quit smoking about 36 years ago. His smoking use included cigarettes. He started smoking about 53 years ago. He smoked 3.00 packs per day. He has never used smokeless tobacco. He reports that he drinks about 8.4 oz of alcohol per week. He reports that he does not use drugs.   Family History:  The patient's family history includes Heart disease in his father and mother; Heart murmur in his sister; Hypertension in his brother, sister, sister, sister, and sister; Liver disease in his brother.    ROS:  Please see the history of present illness.   Otherwise, review of systems are positive for none.   All other systems are reviewed and negative.    PHYSICAL EXAM: VS:  There were no vitals taken for this visit. , BMI There is no height or weight on file to calculate BMI. Affect appropriate Healthy:  appears stated age 86: normal Neck supple with no adenopathy JVP normal no bruits no thyromegaly Lungs clear with no wheezing and good diaphragmatic motion Heart:  S1/S2 no murmur, no rub, gallop or click PMI normal Abdomen: benighn, BS positve, no tenderness, no AAA no bruit.  No HSM or HJR Distal pulses intact with no bruits No edema Neuro non-focal Skin warm and dry No muscular weakness    EKG:  SR rate 56 normal 04/26/18   Recent Labs: 04/26/2018: ALT 31; BUN 13; Creatinine, Ser 0.80; Hemoglobin 15.3; Platelets 173; Potassium 3.6; Sodium 140; TSH 1.420    Lipid Panel    Component Value Date/Time   CHOL 96 (L) 04/26/2018 1132   TRIG 60 04/26/2018 1132   TRIG 82 03/25/2015 0950   HDL 38 (L) 04/26/2018 1132   HDL 38 (L) 03/25/2015 0950   CHOLHDL 2.5 04/26/2018 1132   LDLCALC 46 04/26/2018 1132   LDLCALC 54 07/17/2014 0828   LDLDIRECT 74 01/03/2017 1100      Wt Readings from Last 3 Encounters:  04/26/18 194 lb  (88 kg)  02/28/18 199 lb (90.3 kg)  01/16/18 195 lb 6.4 oz (88.6 kg)      Other studies Reviewed: Additional studies/ records that were reviewed today include: notes from cardiology 2015 TTE, holter myovue old ECG Notes Dr Laurance Flatten [facility] .04/26/18     ASSESSMENT AND PLAN:  1.  Chest Pain:  Normal myovue 2015 ECG normal now CRF;s  Will repeat  2. HTN:  Well  controlled.  Continue current medications and low sodium Dash type diet.   3. 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 HLD :  Continue statin labs with primary    Current medicines are reviewed at length with the patient today.  The patient does not have concerns regarding medicines.  The following changes have been made:  no change  Labs/ tests ordered today include: TTE Myovue  No orders of the defined types were placed in this encounter.    Disposition:   FU with cardiology PRN      Signed, Jenkins Rouge, MD  05/03/2018 2:51 PM    [facility] [address], [place], [place]  [address] Phone: [phone]; Fax: [phone]

## 2018-05-03 NOTE — Patient Instructions (Signed)
Medication Instructions:  Your physician recommends that you continue on your current medications as directed. Please refer to the Current Medication list given to you today.   Labwork: none  Testing/Procedures: Your physician has requested that you have an echocardiogram. Echocardiography is a painless test that uses sound waves to create images of your heart. It provides your doctor with information about the size and shape of your heart and how well your heart's chambers and valves are working. This procedure takes approximately one hour. There are no restrictions for this procedure.  Your physician has requested that you have en exercise stress myoview. For further information please visit [email or web address]. Please follow instruction sheet, as given.    Follow-Up: Your physician recommends that you schedule a follow-up appointment in: as needed    Any Other Special Instructions Will Be Listed Below (If Applicable).     If you need a refill on your cardiac medications before your next appointment, please call your pharmacy.

## 2018-05-03 NOTE — Addendum Note (Signed)
Addended byDebbora Lacrosse R on: 05/03/2018 03:09 PM   Modules accepted: Orders

## 2018-05-09 ENCOUNTER — Telehealth: Payer: Self-pay | Admitting: Family Medicine

## 2018-05-09 MED ORDER — ROSUVASTATIN CALCIUM 5 MG PO TABS: ORAL_TABLET | ORAL | 3 refills | Status: DC

## 2018-05-09 NOTE — Telephone Encounter (Signed)
Pt aware - will re-fax

## 2018-05-15 ENCOUNTER — Encounter (HOSPITAL_COMMUNITY): Payer: Self-pay

## 2018-05-15 ENCOUNTER — Encounter (HOSPITAL_BASED_OUTPATIENT_CLINIC_OR_DEPARTMENT_OTHER)
Admission: RE | Admit: 2018-05-15 | Discharge: 2018-05-15 | Disposition: A | Discharge status: Home / Self Care | Payer: Medicare Other | Source: Ambulatory Visit | Attending: Cardiovascular Disease | Admitting: Cardiovascular Disease

## 2018-05-15 ENCOUNTER — Encounter (HOSPITAL_COMMUNITY)
Admission: RE | Admit: 2018-05-15 | Discharge: 2018-05-15 | Disposition: A | Discharge status: Home / Self Care | Payer: Medicare Other | Source: Ambulatory Visit | Attending: Cardiovascular Disease | Admitting: Cardiovascular Disease

## 2018-05-15 ENCOUNTER — Ambulatory Visit (HOSPITAL_BASED_OUTPATIENT_CLINIC_OR_DEPARTMENT_OTHER)
Admission: RE | Admit: 2018-05-15 | Discharge: 2018-05-15 | Disposition: A | Discharge status: Home / Self Care | Payer: Medicare Other | Source: Ambulatory Visit | Attending: Cardiovascular Disease | Admitting: Cardiovascular Disease

## 2018-05-15 DIAGNOSIS — I10 Essential (primary) hypertension: Secondary | ICD-10-CM | POA: Insufficient documentation

## 2018-05-15 DIAGNOSIS — F4310 Post-traumatic stress disorder, unspecified: Secondary | ICD-10-CM

## 2018-05-15 DIAGNOSIS — Z87891 Personal history of nicotine dependence: Secondary | ICD-10-CM | POA: Insufficient documentation

## 2018-05-15 DIAGNOSIS — R079 Chest pain, unspecified: Secondary | ICD-10-CM | POA: Insufficient documentation

## 2018-05-15 DIAGNOSIS — K219 Gastro-esophageal reflux disease without esophagitis: Secondary | ICD-10-CM | POA: Insufficient documentation

## 2018-05-15 LAB — NM MYOCAR MULTI W/SPECT W/WALL MOTION / EF
CHL CUP NUCLEAR SSS: 3
CSEPED: 7 min
CSEPEW: 10.1 METS
Exercise duration (sec): 36 s
LHR: 0.21
LV dias vol: 101 mL (ref 62–150)
LV sys vol: 49 mL
MPHR: 150 {beats}/min
NUC STRESS TID: 1.08
Peak HR: 110 {beats}/min
Percent HR: 73 %
RPE: 15
Rest HR: 54 {beats}/min
SDS: 1
SRS: 2

## 2018-05-15 MED ORDER — TECHNETIUM TC 99M TETROFOSMIN IV KIT
10.0000 | PACK | Freq: Once | INTRAVENOUS | Status: AC | PRN
  Administered 2018-05-15: 10.8 via INTRAVENOUS

## 2018-05-15 MED ORDER — REGADENOSON 0.4 MG/5ML IV SOLN
INTRAVENOUS | Status: AC
  Administered 2018-05-15: 0.4 mg via INTRAVENOUS
  Filled 2018-05-15: qty 5

## 2018-05-15 MED ORDER — SODIUM CHLORIDE 0.9% FLUSH
INTRAVENOUS | Status: AC
  Administered 2018-05-15: 10 mL via INTRAVENOUS
  Filled 2018-05-15: qty 10

## 2018-05-15 MED ORDER — TECHNETIUM TC 99M TETROFOSMIN IV KIT
30.0000 | PACK | Freq: Once | INTRAVENOUS | Status: AC | PRN
  Administered 2018-05-15: 33 via INTRAVENOUS

## 2018-05-15 NOTE — Progress Notes (Signed)
*  PRELIMINARY RESULTS* Echocardiogram 2D Echocardiogram has been performed.  Leavy Cella 05/15/2018, 10:17 AM

## 2018-05-16 ENCOUNTER — Telehealth: Payer: Self-pay

## 2018-05-16 NOTE — Telephone Encounter (Signed)
Called pt., no answer. Left message for pt to return call.  

## 2018-05-18 DIAGNOSIS — M25511 Pain in right shoulder: Secondary | ICD-10-CM | POA: Diagnosis not present

## 2018-06-20 DIAGNOSIS — L814 Other melanin hyperpigmentation: Secondary | ICD-10-CM | POA: Diagnosis not present

## 2018-06-20 DIAGNOSIS — D0439 Carcinoma in situ of skin of other parts of face: Secondary | ICD-10-CM | POA: Diagnosis not present

## 2018-06-20 DIAGNOSIS — C44519 Basal cell carcinoma of skin of other part of trunk: Secondary | ICD-10-CM | POA: Diagnosis not present

## 2018-06-20 DIAGNOSIS — L821 Other seborrheic keratosis: Secondary | ICD-10-CM | POA: Diagnosis not present

## 2018-06-20 DIAGNOSIS — D225 Melanocytic nevi of trunk: Secondary | ICD-10-CM | POA: Diagnosis not present

## 2018-06-20 DIAGNOSIS — L570 Actinic keratosis: Secondary | ICD-10-CM | POA: Diagnosis not present

## 2018-06-20 DIAGNOSIS — Z85828 Personal history of other malignant neoplasm of skin: Secondary | ICD-10-CM | POA: Diagnosis not present

## 2018-08-03 ENCOUNTER — Other Ambulatory Visit: Payer: Self-pay

## 2018-08-03 ENCOUNTER — Emergency Department (HOSPITAL_COMMUNITY): Payer: Medicare Other

## 2018-08-03 ENCOUNTER — Emergency Department (HOSPITAL_COMMUNITY)
Admission: EM | Admit: 2018-08-03 | Discharge: 2018-08-04 | Disposition: A | Discharge status: Home / Self Care | Payer: Medicare Other | Attending: Emergency Medicine | Admitting: Emergency Medicine

## 2018-08-03 ENCOUNTER — Encounter (HOSPITAL_COMMUNITY): Payer: Self-pay | Admitting: Emergency Medicine

## 2018-08-03 ENCOUNTER — Ambulatory Visit: Payer: Medicare Other | Admitting: *Deleted

## 2018-08-03 DIAGNOSIS — Z79899 Other long term (current) drug therapy: Secondary | ICD-10-CM | POA: Diagnosis not present

## 2018-08-03 DIAGNOSIS — Z87891 Personal history of nicotine dependence: Secondary | ICD-10-CM | POA: Insufficient documentation

## 2018-08-03 DIAGNOSIS — I480 Paroxysmal atrial fibrillation: Secondary | ICD-10-CM | POA: Insufficient documentation

## 2018-08-03 DIAGNOSIS — Z0130 Encounter for examination of blood pressure without abnormal findings: Secondary | ICD-10-CM

## 2018-08-03 DIAGNOSIS — E876 Hypokalemia: Secondary | ICD-10-CM | POA: Diagnosis not present

## 2018-08-03 DIAGNOSIS — R079 Chest pain, unspecified: Secondary | ICD-10-CM | POA: Diagnosis not present

## 2018-08-03 DIAGNOSIS — Z7982 Long term (current) use of aspirin: Secondary | ICD-10-CM | POA: Insufficient documentation

## 2018-08-03 DIAGNOSIS — I10 Essential (primary) hypertension: Secondary | ICD-10-CM | POA: Diagnosis not present

## 2018-08-03 DIAGNOSIS — T502X5A Adverse effect of carbonic-anhydrase inhibitors, benzothiadiazides and other diuretics, initial encounter: Secondary | ICD-10-CM | POA: Diagnosis not present

## 2018-08-03 DIAGNOSIS — R002 Palpitations: Secondary | ICD-10-CM | POA: Diagnosis present

## 2018-08-03 LAB — BASIC METABOLIC PANEL
ANION GAP: 11 (ref 5–15)
BUN: 10 mg/dL (ref 8–23)
CHLORIDE: 99 mmol/L (ref 98–111)
CO2: 28 mmol/L (ref 22–32)
Calcium: 9.2 mg/dL (ref 8.9–10.3)
Creatinine, Ser: 0.87 mg/dL (ref 0.61–1.24)
GFR calc Af Amer: >60 mL/min (ref >60)
GFR calc non Af Amer: >60 mL/min (ref >60)
GLUCOSE: 96 mg/dL (ref 70–99)
Potassium: 2.9 mmol/L — ABNORMAL LOW (ref 3.5–5.1)
Sodium: 138 mmol/L (ref 135–145)

## 2018-08-03 LAB — CBC
HCT: 49.7 % (ref 39.0–52.0)
HEMOGLOBIN: 16.9 g/dL (ref 13.0–17.0)
MCH: 33.9 pg (ref 26.0–34.0)
MCHC: 34 g/dL (ref 30.0–36.0)
MCV: 99.6 fL (ref 78.0–100.0)
Platelets: 192 10*3/uL (ref 150–400)
RBC: 4.99 MIL/uL (ref 4.22–5.81)
RDW: 12.6 % (ref 11.5–15.5)
WBC: 7.5 10*3/uL (ref 4.0–10.5)

## 2018-08-03 LAB — I-STAT TROPONIN, ED: Troponin i, poc: 0.02 ng/mL (ref 0.00–0.08)

## 2018-08-03 LAB — MAGNESIUM: Magnesium: 1.7 mg/dL (ref 1.7–2.4)

## 2018-08-03 MED ORDER — POTASSIUM CHLORIDE CRYS ER 20 MEQ PO TBCR
40.0000 meq | EXTENDED_RELEASE_TABLET | Freq: Once | ORAL | Status: AC
  Administered 2018-08-03: 40 meq via ORAL
  Filled 2018-08-03: qty 2

## 2018-08-03 MED ORDER — POTASSIUM CHLORIDE 10 MEQ/100ML IV SOLN
10.0000 meq | Freq: Once | INTRAVENOUS | Status: AC
  Administered 2018-08-03: 10 meq via INTRAVENOUS
  Filled 2018-08-03: qty 100

## 2018-08-03 MED ORDER — APIXABAN 5 MG PO TABS
5.0000 mg | ORAL_TABLET | Freq: Once | ORAL | Status: AC
  Administered 2018-08-03: 5 mg via ORAL
  Filled 2018-08-03: qty 1

## 2018-08-03 MED ORDER — POTASSIUM CHLORIDE CRYS ER 20 MEQ PO TBCR: 20.0000 meq | EXTENDED_RELEASE_TABLET | Freq: Two times a day (BID) | ORAL | 0 refills | Status: DC

## 2018-08-03 MED ORDER — MAGNESIUM SULFATE 2 GM/50ML IV SOLN
2.0000 g | Freq: Once | INTRAVENOUS | Status: AC
  Administered 2018-08-04: 2 g via INTRAVENOUS
  Filled 2018-08-03: qty 50

## 2018-08-03 MED ORDER — APIXABAN 5 MG PO TABS: 5.0000 mg | ORAL_TABLET | Freq: Two times a day (BID) | ORAL | 0 refills | Status: DC

## 2018-08-03 NOTE — ED Notes (Signed)
Pt was sent POV by PCP for new onset a-fib.  Pt has EKG with him.

## 2018-08-03 NOTE — ED Provider Notes (Signed)
[facility] DEPART[facility] Provider Note   CSN: [id number] Arrival date & time: 08/03/18  1749     History   Chief Complaint Chief Complaint  Patient presents with  . Atrial Fibrillation    HPI Joseph Larsen is a 72 y.o. male.  The history is provided by the patient.  Atrial Fibrillation   He has history of hypertension, hypokalemia, PTSD and comes in because of atrial fibrillation.  He states for the last 3 weeks, he has had remittent episodes where he feels that his heart is beating irregularly.  This is associated with a tight feeling in his chest, but no dyspnea or nausea or diaphoresis.  Episodes will last up to several hours.  He was in his primary care physician's office earlier today and was found to be in atrial fibrillation and he was referred directly to the emergency department.  Past Medical History:  Diagnosis Date  . Abnormal ECG    a. 08/2012 Abnl ETT with ST changes in recovery;  b. 08/2012 Ex MV, ex time 10:00, EF 66%, no ischemia/infarct.  . Allergic rhinitis   . Anxiety   . Cataract   . Depression   . Diverticular disease   . Dyspnea on exertion   . ETOH abuse   . GERD (gastroesophageal reflux disease)   . Gout   . Hemorrhoids   . Hiatal hernia   . HTN (hypertension)   . Low testosterone   . MACROCYTIC ANEMIA   . NONSPEC ELEVATION OF LEVELS OF TRANSAMINASE/LDH   . Nonspecific abnormal finding in stool contents   . Palpitations   . PTSD (post-traumatic stress disorder)   . PTSD (post-traumatic stress disorder)    [name]    Patient Active Problem List   Diagnosis Date Noted  . Aortic atherosclerosis ([place]) 03/01/2018  . Barrett's esophagus 01/03/2017  . Non-healing ulcer ([place]) 10/13/2015  . Neoplasm of uncertain behavior of skin 10/08/2015  . Skin lesion 09/07/2015  . Hypokalemia 03/03/2014  . BPH (benign prostatic hyperplasia) 03/03/2014  . Chest pain   . Palpitations   . Dyspnea on exertion   . ETOH  abuse   . PTSD (post-traumatic stress disorder) 10/02/2013  . Abnormal stress test 08/29/2012  . GERD (gastroesophageal reflux disease)   . HTN (hypertension)   . Anxiety   . Depression   . Allergic rhinitis   . Diverticular disease   . Hemorrhoids   . Hiatal hernia   . Gout   . MACROCYTIC ANEMIA 08/09/2010  . NONSPEC ELEVATION OF LEVELS OF TRANSAMINASE/LDH 08/09/2010  . NONSPECIFIC ABNORMAL FINDING IN STOOL CONTENTS 08/09/2010    Past Surgical History:  Procedure Laterality Date  . CHOLECYSTECTOMY  2004  . KNEE SURGERY Bilateral         Home Medications    Prior to Admission medications   Medication Sig Start Date End Date Taking? Authorizing Provider  allopurinol (ZYLOPRIM) 300 MG tablet Take 300 mg by mouth daily.    [provider]  amLODipine (NORVASC) 10 MG tablet Take 5 mg by mouth daily.    [provider]  aspirin 81 MG tablet Take 81 mg by mouth daily.    [provider]  atenolol (TENORMIN) 100 MG tablet Take 50 mg by mouth daily.    [provider]  buPROPion (WELLBUTRIN SR) 200 MG 12 hr tablet Take 200 mg by mouth 2 (two) times daily.    [provider]  cholecalciferol (VITAMIN D) 1000  UNITS tablet Take 1,000 Units by mouth daily.    [provider]  cyanocobalamin (,VITAMIN B-12,) 1000 MCG/ML injection Inject 1 mL (1,000 mcg total) into the muscle every 30 (thirty) days. 01/05/17   Timmothy Euler, MD  cyanocobalamin (,VITAMIN B-12,) 1000 MCG/ML injection Inject 1 mL (1,000 mcg total) into the muscle every 30 (thirty) days for 12 doses. 02/28/18 01/25/19  Chipper Herb, MD  diazepam (VALIUM) 10 MG tablet Take 1 tablet (10 mg total) by mouth every 8 (eight) hours as needed for anxiety. Patient taking differently: Take 10 mg by mouth every 12 (twelve) hours as needed for anxiety.  02/27/13   Chipper Herb, MD  flunisolide (NASALIDE) 0.025 % SOLN Inhale 2 sprays into the lungs as needed.    [provider]  hydrochlorothiazide (HYDRODIURIL) 25 MG tablet Take 12.5 mg by mouth daily.    [provider]  Misc Natural Products (OSTEO BI-FLEX/5-LOXIN ADVANCED PO) Take 2 tablets by mouth.    [provider]  Omega-3 Fatty Acids (FISH OIL) 1000 MG CAPS Take 4 capsules by mouth daily.     [provider]  omeprazole (PRILOSEC) 20 MG capsule Take 40 mg by mouth 2 (two) times daily before a meal.     [provider]  QUEtiapine (SEROQUEL) 50 MG tablet Take 25 mg by mouth at bedtime.    [provider]  rosuvastatin (CRESTOR) 5 MG tablet Take 0.5 tab po three times weekly as directed 05/09/18   Chipper Herb, MD  valsartan (DIOVAN) 320 MG tablet Take 160 mg by mouth daily.    [provider]    Family History Family History  Problem Relation Age of Onset  . Heart disease Mother        "died of chf"  . Heart disease Father        "died of old age"  . Hypertension Sister   . Liver disease Brother   . Hypertension Brother   . Hypertension Sister   . Hypertension Sister   . Hypertension Sister   . Heart murmur Sister   . Colon cancer Neg Hx     Social History Social History   Tobacco Use  . Smoking status: Former Smoker    Packs/day: 3.00    Types: Cigarettes    Start date: 02/09/1965    Last attempt to quit: 05/12/1982    Years since quitting: 36.2  . Smokeless tobacco: Never Used  Substance Use Topics  . Alcohol use: Yes    Alcohol/week: 14.0 standard drinks    Types: 14 Cans of beer per week    Comment: drinks 2-3 cans of beer/day    . Drug use: No    Comment: 1 cup of decaf coffee/day.     Allergies   Enalapril maleate; Penicillins; Hytrin [terazosin]; and Zestril [lisinopril]   Review of Systems Review of Systems  All other systems reviewed and are negative.    Physical Exam Updated Vital Signs BP (!) 143/87 (BP Location: Right Arm)   Pulse (!) 55   Temp 98.1 F (36.7 C) (Oral)   Resp 16   Ht 5\' 11"   (1.803 m)   Wt 85.7 kg   SpO2 98%   BMI 26.36 kg/m   Physical Exam  Nursing note and vitals reviewed.  72 year old male, resting comfortably and in no acute distress. Vital signs are significant for mild elevation of blood pressure and mild bradycardia. Oxygen saturation is 98%, which is  normal. Head is normocephalic and atraumatic. PERRLA, EOMI. Oropharynx is clear. Neck is nontender and supple without adenopathy or JVD. Back is nontender and there is no CVA tenderness. Lungs are clear without rales, wheezes, or rhonchi. Chest is nontender. Heart has regular rate and rhythm without murmur. Abdomen is soft, flat, nontender without masses or hepatosplenomegaly and peristalsis is normoactive. Extremities have no cyanosis or edema, full range of motion is present. Skin is warm and dry without rash. Neurologic: Mental status is normal, cranial nerves are intact, there are no motor or sensory deficits.  ED Treatments / Results  Labs (all labs ordered are listed, but only abnormal results are displayed) Labs Reviewed  BASIC METABOLIC PANEL - Abnormal; Notable for the following components:      Result Value   Potassium 2.9 (*)    All other components within normal limits  CBC  MAGNESIUM  I-STAT TROPONIN, ED    EKG EKG Interpretation  Date/Time:  Friday August 03 2018 18:03:10 EDT Ventricular Rate:  68 PR Interval:  206 QRS Duration: 100 QT Interval:  438 QTC Calculation: 465 R Axis:   -30 Text Interpretation:  Normal sinus rhythm Left axis deviation Possible Lateral infarct , age undetermined Abnormal ECG When compared with ECG of 09/05/2012, No significant change was found Confirmed by Delora Fuel ([id number]) on 08/03/2018 11:01:44 PM   Radiology Dg Chest 2 View  Result Date: 08/03/2018 CLINICAL DATA:  72 y/o  M; upper chest pain. EXAM: CHEST - 2 VIEW COMPARISON:  02/28/2018 chest radiograph FINDINGS: Normal cardiac silhouette. Clear lungs. No pleural effusion or pneumothorax.  No acute osseous abnormality is evident. Chronic right posterior 5 and 6 rib fracture. IMPRESSION: No acute pulmonary process identified. Electronically Signed   By: Kristine Garbe M.D.   On: 08/03/2018 19:33    Procedures Procedures   Medications Ordered in ED Medications  potassium chloride 10 mEq in 100 mL IVPB (10 mEq Intravenous New Bag/Given 08/03/18 2318)  magnesium sulfate IVPB 2 g 50 mL (has no administration in time range)  apixaban (ELIQUIS) tablet 5 mg (has no administration in time range)  potassium chloride SA (K-DUR,KLOR-CON) CR tablet 40 mEq (40 mEq Oral Given 08/03/18 2313)     Initial Impression / Assessment and Plan / ED Course  I have reviewed the triage vital signs and the nursing notes.  Pertinent labs & imaging results that were available during my care of the patient were reviewed by me and considered in my medical decision making (see chart for details).  Paroxysmal atrial fibrillation.  ECGs were brought in from the doctor's office clearly showing atrial fibrillation with controlled ventricular response.  ECG in the emergency department shows he is in sinus rhythm.  Cardiac monitor when I going to see him is also showing sinus rhythm.  Chest x-ray showed no acute process.  Greening labs showed significant hypokalemia with potassium 2.9, and borderline low magnesium.  He is given intravenous magnesium and potassium and given oral potassium.  He is also started on apixaban.  Old records are reviewed, and he has no relevant past visits.  He will be referred to the atrial fibrillation clinic for follow-up, discharged with prescriptions for K-Dur and apixaban.  CHA2DS2/VAS Stroke Risk Points     2 >= 2 Points: High Risk  1 - 1.99 Points: Medium Risk  0 Points: Low Risk   1 Point for age, 1 point for history of hypertension Last Change: N/A     This score determines the patient's  risk of having a stroke if the  patient has atrial fibrillation.     Final  Clinical Impressions(s) / ED Diagnoses   Final diagnoses:  Paroxysmal atrial fibrillation ([place])  Diuretic-induced hypokalemia    ED Discharge Orders         Ordered    apixaban (ELIQUIS) 5 MG TABS tablet  2 times daily     08/03/18 2316    potassium chloride SA (K-DUR,KLOR-CON) 20 MEQ tablet  2 times daily     08/03/18 2316    Amb Referral to AFIB Clinic     08/03/18 4481           Delora Fuel, MD 85/63/14 2341

## 2018-08-03 NOTE — Progress Notes (Signed)
Pt presents with chest discomfort radiating into neck x 3 days bp 119 77 p 75  Regular   While in office  Pt's heart rate went up to 114 and irregular ekg performed, reviewed by Dr Dettinger showing atrial fibrillation Instructed pt to go to ED Pt verbalizes understanding

## 2018-08-03 NOTE — ED Triage Notes (Signed)
Pt came from doctors office and they told him to come here for new onset afib. Pt reports discomfort to upper chest, weakness and sob.

## 2018-08-04 DIAGNOSIS — I480 Paroxysmal atrial fibrillation: Secondary | ICD-10-CM | POA: Diagnosis not present

## 2018-08-10 ENCOUNTER — Ambulatory Visit (HOSPITAL_COMMUNITY): Payer: Medicare Other | Admitting: Nurse Practitioner

## 2018-08-11 ENCOUNTER — Telehealth: Payer: Self-pay | Admitting: Cardiology

## 2018-08-11 NOTE — Telephone Encounter (Signed)
Phone Note:  Mr. Fugate called to discuss ongoing intermittent palpitations at home. Has recent new diagnosis of atrial fibrillation and noted more frequent episodes of palpitations during the day today. Checks BP and HR frequently for monitoring.   845pm: BP 106/73, HR 60 1140pm: BP 141/97, HR 101  Reports HR generally runs in the 50s-60s. BP not generally as high as 141/97. Denies chest pain, shortness of breath, light headedness, syncope. Just notes more noticeable palpitations intermittently today. Discussed that patient has appointment scheduled with afib clinic on Tuesday and that current vital signs are reassuring, heart rates are frequently variable in atrial fibrillation. Patient to present to ED if he develops worsening symptoms, sustained tachycardia, chest pain, shortness of breath, light headedness or syncope. He noted understanding and comfort with this plan. He will call if any additional symptoms arise.   Bryna Colander, MD

## 2018-08-14 ENCOUNTER — Encounter (HOSPITAL_COMMUNITY): Payer: Self-pay | Admitting: Nurse Practitioner

## 2018-08-14 ENCOUNTER — Ambulatory Visit (HOSPITAL_COMMUNITY)
Admission: RE | Admit: 2018-08-14 | Discharge: 2018-08-14 | Disposition: A | Discharge status: Home / Self Care | Payer: Medicare Other | Source: Ambulatory Visit | Attending: Nurse Practitioner | Admitting: Nurse Practitioner

## 2018-08-14 VITALS — BP 136/74 | HR 51 | Ht 71.0 in | Wt 187.0 lb

## 2018-08-14 DIAGNOSIS — Z87891 Personal history of nicotine dependence: Secondary | ICD-10-CM | POA: Insufficient documentation

## 2018-08-14 DIAGNOSIS — Z880 Allergy status to penicillin: Secondary | ICD-10-CM | POA: Diagnosis not present

## 2018-08-14 DIAGNOSIS — F1010 Alcohol abuse, uncomplicated: Secondary | ICD-10-CM | POA: Diagnosis not present

## 2018-08-14 DIAGNOSIS — D539 Nutritional anemia, unspecified: Secondary | ICD-10-CM | POA: Insufficient documentation

## 2018-08-14 DIAGNOSIS — Z7901 Long term (current) use of anticoagulants: Secondary | ICD-10-CM | POA: Insufficient documentation

## 2018-08-14 DIAGNOSIS — I10 Essential (primary) hypertension: Secondary | ICD-10-CM | POA: Diagnosis not present

## 2018-08-14 DIAGNOSIS — F4310 Post-traumatic stress disorder, unspecified: Secondary | ICD-10-CM | POA: Diagnosis not present

## 2018-08-14 DIAGNOSIS — Z9049 Acquired absence of other specified parts of digestive tract: Secondary | ICD-10-CM | POA: Diagnosis not present

## 2018-08-14 DIAGNOSIS — K219 Gastro-esophageal reflux disease without esophagitis: Secondary | ICD-10-CM | POA: Insufficient documentation

## 2018-08-14 DIAGNOSIS — Z888 Allergy status to other drugs, medicaments and biological substances status: Secondary | ICD-10-CM | POA: Diagnosis not present

## 2018-08-14 DIAGNOSIS — R001 Bradycardia, unspecified: Secondary | ICD-10-CM | POA: Diagnosis not present

## 2018-08-14 DIAGNOSIS — F329 Major depressive disorder, single episode, unspecified: Secondary | ICD-10-CM | POA: Insufficient documentation

## 2018-08-14 DIAGNOSIS — Z79899 Other long term (current) drug therapy: Secondary | ICD-10-CM | POA: Insufficient documentation

## 2018-08-14 DIAGNOSIS — I4891 Unspecified atrial fibrillation: Secondary | ICD-10-CM | POA: Diagnosis present

## 2018-08-14 DIAGNOSIS — I480 Paroxysmal atrial fibrillation: Secondary | ICD-10-CM | POA: Insufficient documentation

## 2018-08-14 DIAGNOSIS — M109 Gout, unspecified: Secondary | ICD-10-CM | POA: Diagnosis not present

## 2018-08-14 LAB — BASIC METABOLIC PANEL
ANION GAP: 8 (ref 5–15)
BUN: 13 mg/dL (ref 8–23)
CHLORIDE: 103 mmol/L (ref 98–111)
CO2: 28 mmol/L (ref 22–32)
Calcium: 9.4 mg/dL (ref 8.9–10.3)
Creatinine, Ser: 0.89 mg/dL (ref 0.61–1.24)
GFR calc Af Amer: >60 mL/min (ref >60)
Glucose, Bld: 92 mg/dL (ref 70–99)
POTASSIUM: 3.9 mmol/L (ref 3.5–5.1)
Sodium: 139 mmol/L (ref 135–145)

## 2018-08-14 LAB — MAGNESIUM: Magnesium: 1.9 mg/dL (ref 1.7–2.4)

## 2018-08-14 MED ORDER — ATENOLOL 25 MG PO TABS: 25.0000 mg | ORAL_TABLET | Freq: Two times a day (BID) | ORAL | 3 refills | Status: DC

## 2018-08-14 NOTE — Patient Instructions (Signed)
Change atenolol to 25 mg twice a day.

## 2018-08-15 NOTE — Progress Notes (Signed)
Primary Care Physician: Chipper Herb, MD Referring Physician: [facility] ER f/u   Joseph Larsen is a 72 y.o. male with a h/o ETOH  abuse, GERD, HTN,PTSD, that is in the afib clinic for f/u of recent new onset episode of afib. He related that he has felt episodes of elevated HR for  at least 3 weeks. He presented ot PCP office today and he was in afib. HE was sent to the ER but by the time he arrived, he had resumed SR. HE is in the SR today but brings in a chart showing episodes of elevated HR. He was started on eliquis 5 mg bid for a CHA2DS2VASc score of 2. He was already on atenolol. Resting HR is 51 bpm. He does not smoke, he does drink daliy alcohol. Wife denies snoring/apena. He tries to walk several times a week. He was also found to be hypokalemic and K+ was repleted and he was d/c on K+ 20 meq  bid. He is also on HCTZ.   He was seen and evaluated by Dr. Johnsie Cancel in May for irregular heart beat with normal myoview and echo. No afib on 48 hour monitor.  Today, he denies symptoms of palpitations, chest pain, shortness of breath, orthopnea, PND, lower extremity edema, dizziness, presyncope, syncope, or neurologic sequela. The patient is tolerating medications without difficulties and is otherwise without complaint today.   Past Medical History:  Diagnosis Date  . Abnormal ECG    a. 08/2012 Abnl ETT with ST changes in recovery;  b. 08/2012 Ex MV, ex time 10:00, EF 66%, no ischemia/infarct.  . Allergic rhinitis   . Anxiety   . Cataract   . Depression   . Diverticular disease   . Dyspnea on exertion   . ETOH abuse   . GERD (gastroesophageal reflux disease)   . Gout   . Hemorrhoids   . Hiatal hernia   . HTN (hypertension)   . Low testosterone   . MACROCYTIC ANEMIA   . NONSPEC ELEVATION OF LEVELS OF TRANSAMINASE/LDH   . Nonspecific abnormal finding in stool contents   . Palpitations   . PTSD (post-traumatic stress disorder)   . PTSD (post-traumatic stress disorder)    [name]    Past Surgical History:  Procedure Laterality Date  . CHOLECYSTECTOMY  2004  . KNEE SURGERY Bilateral     Current Outpatient Medications  Medication Sig Dispense Refill  . allopurinol (ZYLOPRIM) 300 MG tablet Take 150 mg by mouth daily.     Marland Kitchen amLODipine (NORVASC) 10 MG tablet Take 5 mg by mouth daily.    Marland Kitchen apixaban (ELIQUIS) 5 MG TABS tablet Take 1 tablet (5 mg total) by mouth 2 (two) times daily. 60 tablet 0  . atenolol (TENORMIN) 25 MG tablet Take 1 tablet (25 mg total) by mouth 2 (two) times daily. 60 tablet 3  . buPROPion (WELLBUTRIN SR) 200 MG 12 hr tablet Take 200 mg by mouth daily.     . cholecalciferol (VITAMIN D) 1000 UNITS tablet Take 1,000 Units by mouth daily.    . cyanocobalamin (,VITAMIN B-12,) 1000 MCG/ML injection Inject 1 mL (1,000 mcg total) into the muscle every 30 (thirty) days. 3 mL 0  . diazepam (VALIUM) 10 MG tablet Take 1 tablet (10 mg total) by mouth every 8 (eight) hours as needed for anxiety. (Patient taking differently: Take 10 mg by mouth every 12 (twelve) hours as needed for anxiety. ) 90 tablet 1  . flunisolide (NASALIDE) 0.025 %  SOLN Place 2 sprays into the nose 2 (two) times daily as needed (for allergies).     . hydrochlorothiazide (HYDRODIURIL) 25 MG tablet Take 25 mg by mouth daily.     . Misc Natural Products (OSTEO BI-FLEX/5-LOXIN ADVANCED PO) Take 2 tablets by mouth daily.     . Omega-3 Fatty Acids (FISH OIL) 1000 MG CAPS Take 4 capsules by mouth daily.     Marland Kitchen omeprazole (PRILOSEC) 20 MG capsule Take 40 mg by mouth 2 (two) times daily before a meal.     . potassium chloride SA (K-DUR,KLOR-CON) 20 MEQ tablet Take 1 tablet (20 mEq total) by mouth 2 (two) times daily. 60 tablet 0  . QUEtiapine (SEROQUEL) 50 MG tablet Take 12.5 mg by mouth at bedtime.     . rosuvastatin (CRESTOR) 5 MG tablet Take 0.5 tab po three times weekly as directed (Patient taking differently: Take 2.5 mg by mouth every Monday, Wednesday, and Friday. ) 20 tablet 3  . valsartan  (DIOVAN) 320 MG tablet Take 160 mg by mouth daily.     Current Facility-Administered Medications  Medication Dose Route Frequency Provider Last Rate Last Dose  . cyanocobalamin ((VITAMIN B-12)) injection 1,000 mcg  1,000 mcg Intramuscular Q30 days Timmothy Euler, MD   1,000 mcg at 02/13/18 0932  . cyanocobalamin ((VITAMIN B-12)) injection 1,000 mcg  1,000 mcg Intramuscular Q30 days Chipper Herb, MD   1,000 mcg at 04/26/18 1427    Allergies  Allergen Reactions  . Enalapril Maleate     REACTION: rash  . Penicillins     REACTION: rash, itching  . Hytrin [Terazosin] Rash    Rash when in sun  . Zestril [Lisinopril]     Social History   Socioeconomic History  . Marital status: Married    Spouse name: Suanne Marker  . Number of children: 1  . Years of education: Not on file  . Highest education level: Not on file  Occupational History  . Occupation: disabled    Employer: RETIRED    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  Social Needs  . Financial resource strain: Not hard at all  . Food insecurity:    Worry: Never true    Inability: Never true  . Transportation needs:    Medical: Not on file    Non-medical: No  Tobacco Use  . Smoking status: Former Smoker    Packs/day: 3.00    Types: Cigarettes    Start date: 02/09/1965    Last attempt to quit: 05/12/1982    Years since quitting: 36.2  . Smokeless tobacco: Never Used  Substance and Sexual Activity  . Alcohol use: Yes    Alcohol/week: 14.0 standard drinks    Types: 14 Cans of beer per week    Comment: drinks 2-3 cans of beer/day    . Drug use: No    Comment: 1 cup of decaf coffee/day.  Marland Kitchen Sexual activity: Yes  Lifestyle  . Physical activity:    Days per week: 3 days    Minutes per session: 60 min  . Stress: Not at all  Relationships  . Social connections:    Talks on phone: More than three times a week    Gets together: More than three times a week    Attends religious service: Never    Active member of  club or organization: No    Attends meetings of clubs or organizations: Never    Relationship status: Married  . Intimate partner violence:  Fear of current or ex partner: No    Emotionally abused: No    Physically abused: No    Forced sexual activity: No  Other Topics Concern  . Not on file  Social History Narrative   Lives with wife in [place] in a one story home.  Exercises @ gym 3x/wk - stationary bike and light weight training.    Family History  Problem Relation Age of Onset  . Heart disease Mother        "died of chf"  . Heart disease Father        "died of old age"  . Hypertension Sister   . Liver disease Brother   . Hypertension Brother   . Hypertension Sister   . Hypertension Sister   . Hypertension Sister   . Heart murmur Sister   . Colon cancer Neg Hx     ROS- All systems are reviewed and negative except as per the HPI above  Physical Exam: Vitals:   08/14/18 1050  BP: 136/74  Pulse: (!) 51  Weight: 84.8 kg  Height: 5\' 11"  (1.803 m)   Wt Readings from Last 3 Encounters:  08/14/18 84.8 kg  08/03/18 85.7 kg  05/03/18 86.2 kg    Labs: Lab Results  Component Value Date   NA 139 08/14/2018   K 3.9 08/14/2018   CL 103 08/14/2018   CO2 28 08/14/2018   GLUCOSE 92 08/14/2018   BUN 13 08/14/2018   CREATININE 0.89 08/14/2018   CALCIUM 9.4 08/14/2018   MG 1.9 08/14/2018   Lab Results  Component Value Date   INR 1.1 ratio (H) 08/09/2010   Lab Results  Component Value Date   CHOL 96 (L) 04/26/2018   HDL 38 (L) 04/26/2018   LDLCALC 46 04/26/2018   TRIG 60 04/26/2018     GEN- The patient is well appearing, alert and oriented x 3 today.   Head- normocephalic, atraumatic Eyes-  Sclera clear, conjunctiva pink Ears- hearing intact Oropharynx- clear Neck- supple, no JVP Lymph- no cervical lymphadenopathy Lungs- Clear to ausculation bilaterally, normal work of breathing Heart- Regular rate and rhythm, no murmurs, rubs or gallops, PMI not  laterally displaced GI- soft, NT, ND, + BS Extremities- no clubbing, cyanosis, or edema MS- no significant deformity or atrophy Skin- no rash or lesion Psych- euthymic mood, full affect Neuro- strength and sensation are intact  EKG-Sinus brady at 51 bpm, PR int 208 ms, qrs int 94 ms, qtc 416 ms Epic records reviewed Echo-Study Conclusions  - Left ventricle: The cavity size was normal. Wall thickness was   increased increased in a pattern of mild to moderate LVH.   Systolic function was normal. The estimated ejection fraction was   in the range of 60% to 65%. Wall motion was normal; there were no   regional wall motion abnormalities. Indeterminate diastolic   function. - Aortic valve: Trileaflet; mildly calcified leaflets. - Right atrium: Central venous pressure (est): 8 mm Hg. - Atrial septum: No defect or patent foramen ovale was identified. - Tricuspid valve: There was trivial regurgitation. - Pulmonary arteries: Systolic pressure could not be accurately   estimated. - Pericardium, extracardiac: There was no pericardial effusion.  Stress test-Notes recorded by Josue Hector, MD on 05/15/2018 at 1:54 PM EDT Normal myovue study with no evidence of ischemia or infarction  48 hour monitor- 04/2018- Did not show any afib  Assessment and Plan: 1. New onset Paroxysmal afib General education re afib Advised to reduce alcohol to  no more than 2 drinks a week He is currently taking 50 mg atenolol daily and I do  not feel I can go up on the dose for HR of 51 Will try to rx 25 mg bid for better coverage of BB for afib episodes   2. HTN Stable  3. Chadsvasc score of 2 Continue eliquis 5 mg bid Bleeding precautions dioscussed  F/u in one week May need antiarrythmic if not improved with changes in BB dose and reduction of alcohol  Toree Edling C. Alvon Nygaard, [facility] [address] [place], [place] [address] [phone]

## 2018-08-22 ENCOUNTER — Encounter (HOSPITAL_COMMUNITY): Payer: Self-pay | Admitting: Nurse Practitioner

## 2018-08-22 ENCOUNTER — Ambulatory Visit (HOSPITAL_COMMUNITY)
Admission: RE | Admit: 2018-08-22 | Discharge: 2018-08-22 | Disposition: A | Discharge status: Home / Self Care | Payer: Medicare Other | Source: Ambulatory Visit | Attending: Nurse Practitioner | Admitting: Nurse Practitioner

## 2018-08-22 VITALS — BP 122/80 | HR 50 | Ht 71.0 in | Wt 184.0 lb

## 2018-08-22 DIAGNOSIS — Z7901 Long term (current) use of anticoagulants: Secondary | ICD-10-CM | POA: Diagnosis not present

## 2018-08-22 DIAGNOSIS — I440 Atrioventricular block, first degree: Secondary | ICD-10-CM | POA: Insufficient documentation

## 2018-08-22 DIAGNOSIS — F4310 Post-traumatic stress disorder, unspecified: Secondary | ICD-10-CM | POA: Diagnosis not present

## 2018-08-22 DIAGNOSIS — Z87891 Personal history of nicotine dependence: Secondary | ICD-10-CM | POA: Insufficient documentation

## 2018-08-22 DIAGNOSIS — I480 Paroxysmal atrial fibrillation: Secondary | ICD-10-CM | POA: Diagnosis not present

## 2018-08-22 DIAGNOSIS — K219 Gastro-esophageal reflux disease without esophagitis: Secondary | ICD-10-CM | POA: Diagnosis not present

## 2018-08-22 DIAGNOSIS — Z880 Allergy status to penicillin: Secondary | ICD-10-CM | POA: Diagnosis not present

## 2018-08-22 DIAGNOSIS — F329 Major depressive disorder, single episode, unspecified: Secondary | ICD-10-CM | POA: Diagnosis not present

## 2018-08-22 DIAGNOSIS — Z8249 Family history of ischemic heart disease and other diseases of the circulatory system: Secondary | ICD-10-CM | POA: Insufficient documentation

## 2018-08-22 DIAGNOSIS — I10 Essential (primary) hypertension: Secondary | ICD-10-CM | POA: Insufficient documentation

## 2018-08-22 DIAGNOSIS — Z9049 Acquired absence of other specified parts of digestive tract: Secondary | ICD-10-CM | POA: Diagnosis not present

## 2018-08-22 DIAGNOSIS — Z79899 Other long term (current) drug therapy: Secondary | ICD-10-CM | POA: Diagnosis not present

## 2018-08-22 DIAGNOSIS — I4891 Unspecified atrial fibrillation: Secondary | ICD-10-CM | POA: Diagnosis present

## 2018-08-22 DIAGNOSIS — M109 Gout, unspecified: Secondary | ICD-10-CM | POA: Insufficient documentation

## 2018-08-22 DIAGNOSIS — Z888 Allergy status to other drugs, medicaments and biological substances status: Secondary | ICD-10-CM | POA: Insufficient documentation

## 2018-08-22 DIAGNOSIS — D539 Nutritional anemia, unspecified: Secondary | ICD-10-CM | POA: Diagnosis not present

## 2018-08-22 NOTE — Progress Notes (Signed)
Primary Care Physician: Chipper Herb, MD Referring Physician: [facility] ER f/u   Joseph Larsen is a 72 y.o. male with a h/o ETOH  abuse, GERD, HTN,PTSD, that is in the afib clinic for f/u of recent new onset episode of afib. He related that he has felt episodes of elevated HR for  at least 3 weeks. He presented ot PCP office today and he was in afib. HE was sent to the ER but by the time he arrived, he had resumed SR. HE is in the SR today but brings in a chart showing episodes of elevated HR. He was started on eliquis 5 mg bid for a CHA2DS2VASc score of 2. He was already on atenolol. Resting HR is 51 bpm. He does not smoke, he does drink daliy alcohol. Wife denies snoring/apena. He tries to walk several times a week. He was also found to be hypokalemic and K+ was repleted and he was d/c on K+ 20 meq  bid. He is also on HCTZ.   He was seen and evaluated by Dr. Johnsie Cancel in May for irregular heart beat with normal myoview and echo. No afib on 48 hour monitor.  F/u in afib clinc 9/11. Since I divided his atenolol dose he has not had any further episodes of afib. He is happy to continue this approach for now.  Today, he denies symptoms of palpitations, chest pain, shortness of breath, orthopnea, PND, lower extremity edema, dizziness, presyncope, syncope, or neurologic sequela. The patient is tolerating medications without difficulties and is otherwise without complaint today.   Past Medical History:  Diagnosis Date  . Abnormal ECG    a. 08/2012 Abnl ETT with ST changes in recovery;  b. 08/2012 Ex MV, ex time 10:00, EF 66%, no ischemia/infarct.  . Allergic rhinitis   . Anxiety   . Cataract   . Depression   . Diverticular disease   . Dyspnea on exertion   . ETOH abuse   . GERD (gastroesophageal reflux disease)   . Gout   . Hemorrhoids   . Hiatal hernia   . HTN (hypertension)   . Low testosterone   . MACROCYTIC ANEMIA   . NONSPEC ELEVATION OF LEVELS OF TRANSAMINASE/LDH   . Nonspecific abnormal  finding in stool contents   . Palpitations   . PTSD (post-traumatic stress disorder)   . PTSD (post-traumatic stress disorder)    [name]   Past Surgical History:  Procedure Laterality Date  . CHOLECYSTECTOMY  2004  . KNEE SURGERY Bilateral     Current Outpatient Medications  Medication Sig Dispense Refill  . allopurinol (ZYLOPRIM) 300 MG tablet Take 150 mg by mouth daily.     Marland Kitchen amLODipine (NORVASC) 10 MG tablet Take 5 mg by mouth daily.    Marland Kitchen apixaban (ELIQUIS) 5 MG TABS tablet Take 1 tablet (5 mg total) by mouth 2 (two) times daily. 60 tablet 0  . atenolol (TENORMIN) 25 MG tablet Take 1 tablet (25 mg total) by mouth 2 (two) times daily. 60 tablet 3  . buPROPion (WELLBUTRIN SR) 200 MG 12 hr tablet Take 200 mg by mouth daily.     . cholecalciferol (VITAMIN D) 1000 UNITS tablet Take 1,000 Units by mouth daily.    . cyanocobalamin (,VITAMIN B-12,) 1000 MCG/ML injection Inject 1 mL (1,000 mcg total) into the muscle every 30 (thirty) days. 3 mL 0  . diazepam (VALIUM) 10 MG tablet Take 1 tablet (10 mg total) by mouth every 8 (eight) hours  as needed for anxiety. (Patient taking differently: Take 10 mg by mouth every 12 (twelve) hours as needed for anxiety. ) 90 tablet 1  . flunisolide (NASALIDE) 0.025 % SOLN Place 2 sprays into the nose 2 (two) times daily as needed (for allergies).     . hydrochlorothiazide (HYDRODIURIL) 25 MG tablet Take 25 mg by mouth daily.     . Misc Natural Products (OSTEO BI-FLEX/5-LOXIN ADVANCED PO) Take 2 tablets by mouth daily.     . Omega-3 Fatty Acids (FISH OIL) 1000 MG CAPS Take 4 capsules by mouth daily.     Marland Kitchen omeprazole (PRILOSEC) 20 MG capsule Take 40 mg by mouth 2 (two) times daily before a meal.     . potassium chloride SA (K-DUR,KLOR-CON) 20 MEQ tablet Take 1 tablet (20 mEq total) by mouth 2 (two) times daily. 60 tablet 0  . QUEtiapine (SEROQUEL) 50 MG tablet Take 12.5 mg by mouth at bedtime.     . rosuvastatin (CRESTOR) 5 MG tablet Take 0.5 tab po  three times weekly as directed (Patient taking differently: Take 2.5 mg by mouth every Monday, Wednesday, and Friday. ) 20 tablet 3  . valsartan (DIOVAN) 320 MG tablet Take 160 mg by mouth daily.     Current Facility-Administered Medications  Medication Dose Route Frequency Provider Last Rate Last Dose  . cyanocobalamin ((VITAMIN B-12)) injection 1,000 mcg  1,000 mcg Intramuscular Q30 days Timmothy Euler, MD   1,000 mcg at 02/13/18 0932  . cyanocobalamin ((VITAMIN B-12)) injection 1,000 mcg  1,000 mcg Intramuscular Q30 days Chipper Herb, MD   1,000 mcg at 04/26/18 1427    Allergies  Allergen Reactions  . Enalapril Maleate     REACTION: rash  . Penicillins     REACTION: rash, itching  . Hytrin [Terazosin] Rash    Rash when in sun  . Zestril [Lisinopril]     Social History   Socioeconomic History  . Marital status: Married    Spouse name: Suanne Marker  . Number of children: 1  . Years of education: Not on file  . Highest education level: Not on file  Occupational History  . Occupation: disabled    Employer: RETIRED    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  Social Needs  . Financial resource strain: Not hard at all  . Food insecurity:    Worry: Never true    Inability: Never true  . Transportation needs:    Medical: Not on file    Non-medical: No  Tobacco Use  . Smoking status: Former Smoker    Packs/day: 3.00    Types: Cigarettes    Start date: 02/09/1965    Last attempt to quit: 05/12/1982    Years since quitting: 36.3  . Smokeless tobacco: Never Used  Substance and Sexual Activity  . Alcohol use: Yes    Alcohol/week: 14.0 standard drinks    Types: 14 Cans of beer per week    Comment: drinks 2-3 cans of beer/day    . Drug use: No    Comment: 1 cup of decaf coffee/day.  Marland Kitchen Sexual activity: Yes  Lifestyle  . Physical activity:    Days per week: 3 days    Minutes per session: 60 min  . Stress: Not at all  Relationships  . Social connections:    Talks  on phone: More than three times a week    Gets together: More than three times a week    Attends religious service: Never    Active  member of club or organization: No    Attends meetings of clubs or organizations: Never    Relationship status: Married  . Intimate partner violence:    Fear of current or ex partner: No    Emotionally abused: No    Physically abused: No    Forced sexual activity: No  Other Topics Concern  . Not on file  Social History Narrative   Lives with wife in [place] in a one story home.  Exercises @ gym 3x/wk - stationary bike and light weight training.    Family History  Problem Relation Age of Onset  . Heart disease Mother        "died of chf"  . Heart disease Father        "died of old age"  . Hypertension Sister   . Liver disease Brother   . Hypertension Brother   . Hypertension Sister   . Hypertension Sister   . Hypertension Sister   . Heart murmur Sister   . Colon cancer Neg Hx     ROS- All systems are reviewed and negative except as per the HPI above  Physical Exam: Vitals:   08/22/18 0859  BP: 122/80  Pulse: (!) 50  Weight: 83.5 kg  Height: 5\' 11"  (1.803 m)   Wt Readings from Last 3 Encounters:  08/22/18 83.5 kg  08/14/18 84.8 kg  08/03/18 85.7 kg    Labs: Lab Results  Component Value Date   NA 139 08/14/2018   K 3.9 08/14/2018   CL 103 08/14/2018   CO2 28 08/14/2018   GLUCOSE 92 08/14/2018   BUN 13 08/14/2018   CREATININE 0.89 08/14/2018   CALCIUM 9.4 08/14/2018   MG 1.9 08/14/2018   Lab Results  Component Value Date   INR 1.1 ratio (H) 08/09/2010   Lab Results  Component Value Date   CHOL 96 (L) 04/26/2018   HDL 38 (L) 04/26/2018   LDLCALC 46 04/26/2018   TRIG 60 04/26/2018     GEN- The patient is well appearing, alert and oriented x 3 today.   Head- normocephalic, atraumatic Eyes-  Sclera clear, conjunctiva pink Ears- hearing intact Oropharynx- clear Neck- supple, no JVP Lymph- no cervical  lymphadenopathy Lungs- Clear to ausculation bilaterally, normal work of breathing Heart- Regular rate and rhythm, no murmurs, rubs or gallops, PMI not laterally displaced GI- soft, NT, ND, + BS Extremities- no clubbing, cyanosis, or edema MS- no significant deformity or atrophy Skin- no rash or lesion Psych- euthymic mood, full affect Neuro- strength and sensation are intact  EKG-Sinus brady at 50, with first degree block,  bpm, PR int 210 ms, qrs int 92 ms, qtc 412 ms Epic records reviewed Echo-Study Conclusions  - Left ventricle: The cavity size was normal. Wall thickness was   increased increased in a pattern of mild to moderate LVH.   Systolic function was normal. The estimated ejection fraction was   in the range of 60% to 65%. Wall motion was normal; there were no   regional wall motion abnormalities. Indeterminate diastolic   function. - Aortic valve: Trileaflet; mildly calcified leaflets. - Right atrium: Central venous pressure (est): 8 mm Hg. - Atrial septum: No defect or patent foramen ovale was identified. - Tricuspid valve: There was trivial regurgitation. - Pulmonary arteries: Systolic pressure could not be accurately   estimated. - Pericardium, extracardiac: There was no pericardial effusion.  Stress test-Notes recorded by Josue Hector, MD on 05/15/2018 at 1:54 PM EDT Normal myovue study  with no evidence of ischemia or infarction  48 hour monitor- 04/2018- Did not show any afib  Assessment and Plan: 1. New onset Paroxysmal afib General education re afib Advised to reduce alcohol to no more than 2 drinks a week He was taking 50 mg atenolol daily and I did not feel I could go up on the dose for HR of 51, so I   split the dose to 25 mg bid for better coverage of BB for afib episodes This seems to be controlling  afib for now  If burden increases will ad  AAD   2. HTN Stable  3. Chadsvasc score of 2 Continue eliquis 5 mg bid Bleeding precautions  dioscussed  F/u in one month, sooner if afib burden increases  Butch Penny C. Arshawn Valdez, [facility] [address] [place], [place] [address] [phone]

## 2018-08-27 DIAGNOSIS — L821 Other seborrheic keratosis: Secondary | ICD-10-CM | POA: Diagnosis not present

## 2018-08-27 DIAGNOSIS — Z85828 Personal history of other malignant neoplasm of skin: Secondary | ICD-10-CM | POA: Diagnosis not present

## 2018-08-27 DIAGNOSIS — L570 Actinic keratosis: Secondary | ICD-10-CM | POA: Diagnosis not present

## 2018-08-29 ENCOUNTER — Encounter: Payer: Self-pay | Admitting: Family Medicine

## 2018-08-29 ENCOUNTER — Ambulatory Visit (INDEPENDENT_AMBULATORY_CARE_PROVIDER_SITE_OTHER): Payer: Medicare Other | Admitting: Family Medicine

## 2018-08-29 VITALS — BP 136/82 | HR 52 | Temp 97.5°F | Ht 71.0 in | Wt 185.0 lb

## 2018-08-29 DIAGNOSIS — I4891 Unspecified atrial fibrillation: Secondary | ICD-10-CM

## 2018-08-29 DIAGNOSIS — I10 Essential (primary) hypertension: Secondary | ICD-10-CM

## 2018-08-29 DIAGNOSIS — Z024 Encounter for examination for driving license: Secondary | ICD-10-CM

## 2018-08-29 DIAGNOSIS — E538 Deficiency of other specified B group vitamins: Secondary | ICD-10-CM

## 2018-08-29 NOTE — Progress Notes (Signed)
Subjective:  Patient ID: Joseph Larsen, male    DOB: 27-Mar-1946  Age: 72 y.o. MRN: [id number]  CC: Annual Exam (DOT)   HPI YASHAR INCLAN presents for DOT exam.  He recently has been cleared by his cardiologist.  Those notes were reviewed.  This is because of atrial fibrillation.  Follow-up of hypertension. Patient has no history of headache chest pain or shortness of breath or recent cough. Patient also denies symptoms of TIA such as numbness weakness lateralizing. Patient checks  blood pressure at home and has not had any elevated readings recently. Patient denies side effects from his medication. States taking it regularly.   Depression screen [facility] 2/9 08/29/2018 04/26/2018 02/28/2018  Decreased Interest 0 0 0  Down, Depressed, Hopeless 0 0 0  PHQ - 2 Score 0 0 0    History Virgil has a past medical history of Abnormal ECG, Allergic rhinitis, Anxiety, Cataract, Depression, Diverticular disease, Dyspnea on exertion, ETOH abuse, GERD (gastroesophageal reflux disease), Gout, Hemorrhoids, Hiatal hernia, HTN (hypertension), Low testosterone, MACROCYTIC ANEMIA, NONSPEC ELEVATION OF LEVELS OF TRANSAMINASE/LDH, Nonspecific abnormal finding in stool contents, Palpitations, PTSD (post-traumatic stress disorder), and PTSD (post-traumatic stress disorder).   He has a past surgical history that includes Knee surgery (Bilateral) and Cholecystectomy (2004).   His family history includes Heart disease in his father and mother; Heart murmur in his sister; Hypertension in his brother, sister, sister, sister, and sister; Liver disease in his brother.He reports that he quit smoking about 36 years ago. His smoking use included cigarettes. He started smoking about 53 years ago. He smoked 3.00 packs per day. He has never used smokeless tobacco. He reports that he drinks about 14.0 standard drinks of alcohol per week. He reports that he does not use drugs.    ROS Review of Systems  Constitutional: Negative for  activity change, fatigue and unexpected weight change.  HENT: Negative for congestion, ear pain, hearing loss, postnasal drip and trouble swallowing.   Eyes: Negative for pain and visual disturbance.  Respiratory: Negative for cough, chest tightness and shortness of breath.   Cardiovascular: Negative for chest pain, palpitations and leg swelling.  Gastrointestinal: Negative for abdominal distention, abdominal pain, blood in stool, constipation, diarrhea, nausea and vomiting.  Endocrine: Negative for cold intolerance, heat intolerance and polydipsia.  Genitourinary: Negative for difficulty urinating, dysuria, flank pain, frequency and urgency.  Musculoskeletal: Negative for arthralgias and joint swelling.  Skin: Negative for color change, rash and wound.  Neurological: Negative for dizziness, syncope, speech difficulty, weakness, light-headedness, numbness and headaches.  Hematological: Does not bruise/bleed easily.  Psychiatric/Behavioral: Negative for confusion, decreased concentration, dysphoric mood and sleep disturbance. The patient is not nervous/anxious.     Objective:  BP 136/82 (BP Location: Right Arm, Patient Position: Sitting, Cuff Size: Normal)   Pulse (!) 52   Temp (!) 97.5 F (36.4 C) (Oral)   Ht 5\' 11"  (1.803 m)   Wt 185 lb (83.9 kg)   BMI 25.80 kg/m   BP Readings from Last 3 Encounters:  08/29/18 136/82  08/22/18 122/80  08/14/18 136/74    Wt Readings from Last 3 Encounters:  08/29/18 185 lb (83.9 kg)  08/22/18 184 lb (83.5 kg)  08/14/18 187 lb (84.8 kg)     Physical Exam  Constitutional: He is oriented to person, place, and time. He appears well-developed and well-nourished.  HENT:  Head: Normocephalic and atraumatic.  Mouth/Throat: Oropharynx is clear and moist.  Eyes: Pupils are equal, round, and reactive to light.  EOM are normal.  Neck: Normal range of motion. No tracheal deviation present. No thyromegaly present.  Cardiovascular: Normal rate, regular  rhythm and normal heart sounds. Exam reveals no gallop and no friction rub.  No murmur heard. Pulmonary/Chest: Breath sounds normal. He has no wheezes. He has no rales.  Abdominal: Soft. Bowel sounds are normal. He exhibits no distension and no mass. There is no tenderness. Hernia confirmed negative in the right inguinal area and confirmed negative in the left inguinal area.  Genitourinary: Testes normal and penis normal.  Musculoskeletal: Normal range of motion. He exhibits no edema.  Lymphadenopathy:    He has no cervical adenopathy.  Neurological: He is alert and oriented to person, place, and time.  Skin: Skin is warm and dry.  Psychiatric: He has a normal mood and affect.      Assessment & Plan:   Joaopedro was seen today for annual exam.  Diagnoses and all orders for this visit:  Encounter for Department of Transportation (DOT) examination for driving license renewal  Essential hypertension  Atrial fibrillation, unspecified type ([place])       I am having Laveda Abbe E. Brauner maintain his flunisolide, amLODipine, hydrochlorothiazide, QUEtiapine, valsartan, allopurinol, buPROPion, omeprazole, diazepam, cholecalciferol, Fish Oil, cyanocobalamin, Misc Natural Products (OSTEO BI-FLEX/5-LOXIN ADVANCED PO), rosuvastatin, apixaban, potassium chloride SA, and atenolol. We administered cyanocobalamin. We will continue to administer cyanocobalamin and cyanocobalamin.  Allergies as of 08/29/2018      Reactions   Enalapril Maleate    REACTION: rash   Penicillins    REACTION: rash, itching   Hytrin [terazosin] Rash   Rash when in sun   Zestril [lisinopril]       Medication List        Accurate as of 08/29/18 12:52 PM. Always use your most recent med list.          allopurinol 300 MG tablet Commonly known as:  ZYLOPRIM Take 150 mg by mouth daily.   amLODipine 10 MG tablet Commonly known as:  NORVASC Take 5 mg by mouth daily.   apixaban 5 MG Tabs tablet Commonly known as:   ELIQUIS Take 1 tablet (5 mg total) by mouth 2 (two) times daily.   atenolol 25 MG tablet Commonly known as:  TENORMIN Take 1 tablet (25 mg total) by mouth 2 (two) times daily.   buPROPion 200 MG 12 hr tablet Commonly known as:  WELLBUTRIN SR Take 200 mg by mouth daily.   cholecalciferol 1000 units tablet Commonly known as:  VITAMIN D Take 1,000 Units by mouth daily.   cyanocobalamin 1000 MCG/ML injection Commonly known as:  (VITAMIN B-12) Inject 1 mL (1,000 mcg total) into the muscle every 30 (thirty) days.   diazepam 10 MG tablet Commonly known as:  VALIUM Take 1 tablet (10 mg total) by mouth every 8 (eight) hours as needed for anxiety.   Fish Oil 1000 MG Caps Take 4 capsules by mouth daily.   flunisolide 25 MCG/ACT (0.025%) Soln Commonly known as:  NASALIDE Place 2 sprays into the nose 2 (two) times daily as needed (for allergies).   hydrochlorothiazide 25 MG tablet Commonly known as:  HYDRODIURIL Take 25 mg by mouth daily.   omeprazole 20 MG capsule Commonly known as:  PRILOSEC Take 40 mg by mouth 2 (two) times daily before a meal.   OSTEO BI-FLEX/5-LOXIN ADVANCED PO Take 2 tablets by mouth daily.   potassium chloride SA 20 MEQ tablet Commonly known as:  K-DUR,KLOR-CON Take 1 tablet (20 mEq total) by  mouth 2 (two) times daily.   QUEtiapine 50 MG tablet Commonly known as:  SEROQUEL Take 12.5 mg by mouth at bedtime.   rosuvastatin 5 MG tablet Commonly known as:  CRESTOR Take 0.5 tab po three times weekly as directed   valsartan 320 MG tablet Commonly known as:  DIOVAN Take 160 mg by mouth daily.        Follow-up: Return in about 6 months (around 02/27/2019) for hypertension.  Claretta Fraise, M.D.

## 2018-08-31 ENCOUNTER — Ambulatory Visit (INDEPENDENT_AMBULATORY_CARE_PROVIDER_SITE_OTHER): Payer: Medicare Other | Admitting: Family Medicine

## 2018-08-31 ENCOUNTER — Encounter: Payer: Self-pay | Admitting: Family Medicine

## 2018-08-31 VITALS — BP 133/68 | HR 47 | Temp 96.9°F | Ht 71.0 in | Wt 184.0 lb

## 2018-08-31 DIAGNOSIS — I10 Essential (primary) hypertension: Secondary | ICD-10-CM | POA: Diagnosis not present

## 2018-08-31 DIAGNOSIS — Z0000 Encounter for general adult medical examination without abnormal findings: Secondary | ICD-10-CM | POA: Diagnosis not present

## 2018-08-31 DIAGNOSIS — E538 Deficiency of other specified B group vitamins: Secondary | ICD-10-CM | POA: Diagnosis not present

## 2018-08-31 DIAGNOSIS — E790 Hyperuricemia without signs of inflammatory arthritis and tophaceous disease: Secondary | ICD-10-CM

## 2018-08-31 DIAGNOSIS — M109 Gout, unspecified: Secondary | ICD-10-CM | POA: Diagnosis not present

## 2018-08-31 DIAGNOSIS — I4891 Unspecified atrial fibrillation: Secondary | ICD-10-CM

## 2018-08-31 DIAGNOSIS — Z789 Other specified health status: Secondary | ICD-10-CM | POA: Diagnosis not present

## 2018-08-31 DIAGNOSIS — K22710 Barrett's esophagus with low grade dysplasia: Secondary | ICD-10-CM

## 2018-08-31 DIAGNOSIS — R002 Palpitations: Secondary | ICD-10-CM

## 2018-08-31 DIAGNOSIS — I700 Atherosclerosis of aorta: Secondary | ICD-10-CM | POA: Diagnosis not present

## 2018-08-31 NOTE — Progress Notes (Signed)
Subjective:    Patient ID: Joseph Larsen, male    DOB: 09-Sep-1946, 72 y.o.   MRN: [id number]  HPI Pt here for follow up and management of chronic medical problems which includes a fib and hypertension. He is taking medication regularly.  The patient recently had a visit to the emergency room and was found to have atrial fibrillation and was started on Eliquis.  He is already been back to see the cardiology office after this event.  Otherwise he has no complaints today and he does not need any refills.  He is due to return in FOBT and get lab work.  Patient is also on B12 injections.  She is calm pleasant and doing well.  He has had a couple of episodes further of rapid heartbeat but nothing like he had when he went to the emergency room.  He is on the Eliquis regularly and he understands the importance of taking the blood thinner.  He did have some chest tightness and pressure when he went in the hospital with a rapid atrial fib episode.  He has not had any of that since with the episodes that he has had since his hospitalization.  He denies any chest pain.  He denies any increased shortness of breath.  He denies any trouble with swallowing heartburn indigestion nausea vomiting diarrhea or blood in the stool.  He understands the importance of keeping a check for blood in the stool and in the urine.  He is passing his water with out problems.  His last colonoscopy was in 2011 by Dr. Fuller Plan in September 2011.  He understands that he needs to get another one in the fall 2021.     Patient Active Problem List   Diagnosis Date Noted  . Aortic atherosclerosis ([place]) 03/01/2018  . Barrett's esophagus 01/03/2017  . Non-healing ulcer ([place]) 10/13/2015  . Neoplasm of uncertain behavior of skin 10/08/2015  . Skin lesion 09/07/2015  . Hypokalemia 03/03/2014  . BPH (benign prostatic hyperplasia) 03/03/2014  . Chest pain   . Palpitations   . Dyspnea on exertion   . ETOH abuse   . PTSD (post-traumatic stress  disorder) 10/02/2013  . Abnormal stress test 08/29/2012  . GERD (gastroesophageal reflux disease)   . HTN (hypertension)   . Anxiety   . Depression   . Allergic rhinitis   . Diverticular disease   . Hemorrhoids   . Hiatal hernia   . Gout   . MACROCYTIC ANEMIA 08/09/2010  . NONSPEC ELEVATION OF LEVELS OF TRANSAMINASE/LDH 08/09/2010  . NONSPECIFIC ABNORMAL FINDING IN STOOL CONTENTS 08/09/2010   Outpatient Encounter Medications as of 08/31/2018  Medication Sig  . allopurinol (ZYLOPRIM) 300 MG tablet Take 150 mg by mouth daily.   Marland Kitchen amLODipine (NORVASC) 10 MG tablet Take 5 mg by mouth daily.  Marland Kitchen apixaban (ELIQUIS) 5 MG TABS tablet Take 1 tablet (5 mg total) by mouth 2 (two) times daily.  Marland Kitchen atenolol (TENORMIN) 25 MG tablet Take 1 tablet (25 mg total) by mouth 2 (two) times daily.  Marland Kitchen buPROPion (WELLBUTRIN SR) 200 MG 12 hr tablet Take 200 mg by mouth daily.   . cholecalciferol (VITAMIN D) 1000 UNITS tablet Take 1,000 Units by mouth daily.  . cyanocobalamin (,VITAMIN B-12,) 1000 MCG/ML injection Inject 1 mL (1,000 mcg total) into the muscle every 30 (thirty) days.  . diazepam (VALIUM) 10 MG tablet Take 1 tablet (10 mg total) by mouth every 8 (eight) hours as needed for anxiety. (Patient taking differently:  Take 10 mg by mouth every 12 (twelve) hours as needed for anxiety. )  . flunisolide (NASALIDE) 0.025 % SOLN Place 2 sprays into the nose 2 (two) times daily as needed (for allergies).   . hydrochlorothiazide (HYDRODIURIL) 25 MG tablet Take 25 mg by mouth daily.   . Misc Natural Products (OSTEO BI-FLEX/5-LOXIN ADVANCED PO) Take 2 tablets by mouth daily.   . Omega-3 Fatty Acids (FISH OIL) 1000 MG CAPS Take 4 capsules by mouth daily.   Marland Kitchen omeprazole (PRILOSEC) 20 MG capsule Take 40 mg by mouth 2 (two) times daily before a meal.   . potassium chloride SA (K-DUR,KLOR-CON) 20 MEQ tablet Take 1 tablet (20 mEq total) by mouth 2 (two) times daily.  . QUEtiapine (SEROQUEL) 50 MG tablet Take 12.5 mg by  mouth at bedtime.   . rosuvastatin (CRESTOR) 5 MG tablet Take 0.5 tab po three times weekly as directed (Patient taking differently: Take 2.5 mg by mouth every Monday, Wednesday, and Friday. )  . valsartan (DIOVAN) 320 MG tablet Take 160 mg by mouth daily.   Facility-Administered Encounter Medications as of 08/31/2018  Medication  . cyanocobalamin ((VITAMIN B-12)) injection 1,000 mcg  . cyanocobalamin ((VITAMIN B-12)) injection 1,000 mcg     Review of Systems  Constitutional: Negative.   HENT: Negative.   Eyes: Negative.   Respiratory: Negative.   Cardiovascular: Negative.   Gastrointestinal: Negative.   Endocrine: Negative.   Genitourinary: Negative.   Musculoskeletal: Negative.   Skin: Negative.   Allergic/Immunologic: Negative.   Neurological: Negative.   Hematological: Negative.   Psychiatric/Behavioral: Negative.        Objective:   Physical Exam  Constitutional: He is oriented to person, place, and time. He appears well-developed and well-nourished. No distress.  The patient is pleasant and relaxed  HENT:  Head: Normocephalic and atraumatic.  Right Ear: External ear normal.  Left Ear: External ear normal.  Nose: Nose normal.  Mouth/Throat: Oropharynx is clear and moist. No oropharyngeal exudate.  Eyes: Pupils are equal, round, and reactive to light. Conjunctivae and EOM are normal. Right eye exhibits no discharge. Left eye exhibits no discharge. No scleral icterus.  Up-to-date on eye exam  Neck: Normal range of motion. Neck supple. No thyromegaly present.  No bruits thyromegaly or anterior cervical adenopathy  Cardiovascular: Normal rate, regular rhythm, normal heart sounds and intact distal pulses.  No murmur heard. The heart is regular at 56 to 60/min  Pulmonary/Chest: Effort normal and breath sounds normal. He has no wheezes. He has no rales.  Clear anteriorly and posteriorly and no axillary adenopathy or chest wall masses  Abdominal: Soft. Bowel sounds are  normal. He exhibits no mass. There is no tenderness.  No abdominal tenderness masses liver or spleen enlargement or suprapubic tenderness and no inguinal adenopathy  Musculoskeletal: Normal range of motion. He exhibits no edema.  No joint redness or swelling or tenderness  Lymphadenopathy:    He has no cervical adenopathy.  Neurological: He is alert and oriented to person, place, and time. He has normal reflexes. No cranial nerve deficit.  Skin: Skin is warm and dry. No erythema.  Psychiatric: He has a normal mood and affect. His behavior is normal. Judgment and thought content normal.  The patient's mood affect and behavior are calm and normal for him.  Nursing note and vitals reviewed.  BP 133/68 (BP Location: Left Arm)   Pulse (!) 47   Temp (!) 96.9 F (36.1 C) (Oral)   Ht 5' 11"  (1.803 m)  Wt 184 lb (83.5 kg)   BMI 25.66 kg/m         Assessment & Plan:   1. Essential hypertension -The blood pressure is good today and he will stay on his current treatment - BMP8+EGFR - CBC with Differential/Platelet - Hepatic function panel  2. Atrial fibrillation, unspecified type ([place]) -The heart was regular today at 56 to 60/min.  He says he has had a few episodes of fast heartbeat but nothing like he had when he was in the hospital.  He does have a follow-up visit scheduled with cardiology. - CBC with Differential/Platelet  3. Vitamin B 12 deficiency -Continue with B12 injections and consider getting a B12 level prior to the next injection. - CBC with Differential/Platelet  4. Aortic atherosclerosis ([place]) -Continue with aggressive therapeutic lifestyle changes watching diet closely and taking Crestor pending results of lab work - CBC with Differential/Platelet - Lipid panel  5. Palpitations -Patient still has some bouts with atrial fib and will stay on current treatment and follow-up with cardiology as planned he will continue with his Eliquis. - CBC with  Differential/Platelet  6. B12 deficiency -Get B12 level at some point in the future but not white after B12 injection - CBC with Differential/Platelet  7. Barrett's esophagus with low grade dysplasia -No complaints today with reflux or epigastric discomfort he will continue with his omeprazole - CBC with Differential/Platelet  8. Health maintenance alteration  9. Healthcare maintenance - Lipid panel - VITAMIN D 25 Hydroxy (Vit-D Deficiency, Fractures)  10. Elevated uric acid in blood -Check uric acid but patient has no complaints with gout type symptoms and he is only taking half of a pill which we think is 150 mg daily without any recurrences of gout with this.  Patient Instructions                       Medicare Annual Wellness Visit  [facility] and the medical providers at [facility] strive to bring you the best medical care.  In doing so we not only want to address your current medical conditions and concerns but also to detect new conditions early and prevent illness, disease and health-related problems.    Medicare offers a yearly Wellness Visit which allows our clinical staff to assess your need for preventative services including immunizations, lifestyle education, counseling to decrease risk of preventable diseases and screening for fall risk and other medical concerns.    This visit is provided free of charge (no copay) for all Medicare recipients. The clinical pharmacists at [facility] have begun to conduct these Wellness Visits which will also include a thorough review of all your medications.    As you primary medical provider recommend that you make an appointment for your Annual Wellness Visit if you have not done so already this year.  You may set up this appointment before you leave today or you may call back ([phone]) and schedule an appointment.  Please make sure when you call that you mention that you are scheduling  your Annual Wellness Visit with the clinical pharmacist so that the appointment may be made for the proper length of time.     Continue current medications. Continue good therapeutic lifestyle changes which include good diet and exercise. Fall precautions discussed with patient. If an FOBT was given today- please return it to our front desk. If you are over 86 years old - you may need Prevnar 13 or the  adult Pneumonia vaccine.  **Flu shots are available--- please call and schedule a FLU-CLINIC appointment**  After your visit with [place] today you will receive a survey in the mail or online from [employer] regarding your care with [place]. Please take a moment to fill this out. Your feedback is very important to [place] as you can help [place] better understand your patient needs as well as improve your experience and satisfaction. WE CARE ABOUT YOU!!!   Continue to drink plenty of water and leave off beer and alcohol as much as possible Follow-up with cardiology as planned Remember that your next colonoscopy is due in the fall 2021 Do not forget to get your flu shot starting in October Continue to get eye exams regularly Continue with current dose of allopurinol pending results of lab work  Arrie Senate MD

## 2018-08-31 NOTE — Patient Instructions (Addendum)
Medicare Annual Wellness Visit  [facility] and the medical providers at [facility] strive to bring you the best medical care.  In doing so we not only want to address your current medical conditions and concerns but also to detect new conditions early and prevent illness, disease and health-related problems.    Medicare offers a yearly Wellness Visit which allows our clinical staff to assess your need for preventative services including immunizations, lifestyle education, counseling to decrease risk of preventable diseases and screening for fall risk and other medical concerns.    This visit is provided free of charge (no copay) for all Medicare recipients. The clinical pharmacists at [facility] have begun to conduct these Wellness Visits which will also include a thorough review of all your medications.    As you primary medical provider recommend that you make an appointment for your Annual Wellness Visit if you have not done so already this year.  You may set up this appointment before you leave today or you may call back ([phone]) and schedule an appointment.  Please make sure when you call that you mention that you are scheduling your Annual Wellness Visit with the clinical pharmacist so that the appointment may be made for the proper length of time.     Continue current medications. Continue good therapeutic lifestyle changes which include good diet and exercise. Fall precautions discussed with patient. If an FOBT was given today- please return it to our front desk. If you are over 71 years old - you may need Prevnar 7 or the adult Pneumonia vaccine.  **Flu shots are available--- please call and schedule a FLU-CLINIC appointment**  After your visit with [place] today you will receive a survey in the mail or online from [employer] regarding your care with [place]. Please take a moment to fill this out. Your feedback is very  important to [place] as you can help [place] better understand your patient needs as well as improve your experience and satisfaction. WE CARE ABOUT YOU!!!   Continue to drink plenty of water and leave off beer and alcohol as much as possible Follow-up with cardiology as planned Remember that your next colonoscopy is due in the fall 2021 Do not forget to get your flu shot starting in October Continue to get eye exams regularly Continue with current dose of allopurinol pending results of lab work

## 2018-09-01 LAB — CBC WITH DIFFERENTIAL/PLATELET
BASOS: 1 %
Basophils Absolute: 0.1 10*3/uL (ref 0.0–0.2)
EOS (ABSOLUTE): 0.1 10*3/uL (ref 0.0–0.4)
Eos: 1 %
HEMATOCRIT: 45.6 % (ref 37.5–51.0)
Hemoglobin: 15.2 g/dL (ref 13.0–17.7)
IMMATURE GRANULOCYTES: 0 %
Immature Grans (Abs): 0 10*3/uL (ref 0.0–0.1)
LYMPHS ABS: 2.5 10*3/uL (ref 0.7–3.1)
Lymphs: 39 %
MCH: 33.2 pg — AB (ref 26.6–33.0)
MCHC: 33.3 g/dL (ref 31.5–35.7)
MCV: 100 fL — AB (ref 79–97)
MONOS ABS: 0.7 10*3/uL (ref 0.1–0.9)
Monocytes: 11 %
NEUTROS ABS: 3 10*3/uL (ref 1.4–7.0)
NEUTROS PCT: 48 %
Platelets: 178 10*3/uL (ref 150–450)
RBC: 4.58 x10E6/uL (ref 4.14–5.80)
RDW: 12.9 % (ref 12.3–15.4)
WBC: 6.3 10*3/uL (ref 3.4–10.8)

## 2018-09-01 LAB — BMP8+EGFR
BUN / CREAT RATIO: 11 (ref 10–24)
BUN: 9 mg/dL (ref 8–27)
CO2: 27 mmol/L (ref 20–29)
Calcium: 9.4 mg/dL (ref 8.6–10.2)
Chloride: 98 mmol/L (ref 96–106)
Creatinine, Ser: 0.79 mg/dL (ref 0.76–1.27)
GFR, EST AFRICAN AMERICAN: 104 mL/min/{1.73_m2} (ref >59)
GFR, EST NON AFRICAN AMERICAN: 90 mL/min/{1.73_m2} (ref >59)
Glucose: 73 mg/dL (ref 65–99)
POTASSIUM: 3.8 mmol/L (ref 3.5–5.2)
SODIUM: 141 mmol/L (ref 134–144)

## 2018-09-01 LAB — HEPATIC FUNCTION PANEL
ALBUMIN: 4.5 g/dL (ref 3.5–4.8)
ALT: 28 IU/L (ref 0–44)
AST: 25 IU/L (ref 0–40)
Alkaline Phosphatase: 48 IU/L (ref 39–117)
BILIRUBIN TOTAL: 0.6 mg/dL (ref 0.0–1.2)
BILIRUBIN, DIRECT: 0.17 mg/dL (ref 0.00–0.40)
Total Protein: 6.7 g/dL (ref 6.0–8.5)

## 2018-09-01 LAB — VITAMIN D 25 HYDROXY (VIT D DEFICIENCY, FRACTURES): Vit D, 25-Hydroxy: 71.1 ng/mL (ref 30.0–100.0)

## 2018-09-01 LAB — LIPID PANEL
CHOL/HDL RATIO: 2.7 ratio (ref 0.0–5.0)
CHOLESTEROL TOTAL: 88 mg/dL — AB (ref 100–199)
HDL: 33 mg/dL — AB (ref >39)
LDL Calculated: 48 mg/dL (ref 0–99)
TRIGLYCERIDES: 36 mg/dL (ref 0–149)
VLDL Cholesterol Cal: 7 mg/dL (ref 5–40)

## 2018-09-04 LAB — URIC ACID: URIC ACID: 5 mg/dL (ref 3.7–8.6)

## 2018-09-04 LAB — THYROID PANEL WITH TSH
Free Thyroxine Index: 2.1 (ref 1.2–4.9)
T3 Uptake Ratio: 28 % (ref 24–39)
T4, Total: 7.6 ug/dL (ref 4.5–12.0)
TSH: 1.91 u[IU]/mL (ref 0.450–4.500)

## 2018-09-04 LAB — SPECIMEN STATUS REPORT

## 2018-09-21 ENCOUNTER — Ambulatory Visit (HOSPITAL_COMMUNITY)
Admission: RE | Admit: 2018-09-21 | Discharge: 2018-09-21 | Disposition: A | Discharge status: Home / Self Care | Payer: Medicare Other | Source: Ambulatory Visit | Attending: Nurse Practitioner | Admitting: Nurse Practitioner

## 2018-09-21 ENCOUNTER — Encounter (HOSPITAL_COMMUNITY): Payer: Self-pay | Admitting: Nurse Practitioner

## 2018-09-21 VITALS — BP 140/82 | HR 57 | Ht 71.0 in | Wt 183.0 lb

## 2018-09-21 DIAGNOSIS — Z888 Allergy status to other drugs, medicaments and biological substances status: Secondary | ICD-10-CM | POA: Insufficient documentation

## 2018-09-21 DIAGNOSIS — Z8249 Family history of ischemic heart disease and other diseases of the circulatory system: Secondary | ICD-10-CM | POA: Diagnosis not present

## 2018-09-21 DIAGNOSIS — Z880 Allergy status to penicillin: Secondary | ICD-10-CM | POA: Diagnosis not present

## 2018-09-21 DIAGNOSIS — Z79899 Other long term (current) drug therapy: Secondary | ICD-10-CM | POA: Insufficient documentation

## 2018-09-21 DIAGNOSIS — I10 Essential (primary) hypertension: Secondary | ICD-10-CM | POA: Diagnosis not present

## 2018-09-21 DIAGNOSIS — M109 Gout, unspecified: Secondary | ICD-10-CM | POA: Diagnosis not present

## 2018-09-21 DIAGNOSIS — Z87891 Personal history of nicotine dependence: Secondary | ICD-10-CM | POA: Insufficient documentation

## 2018-09-21 DIAGNOSIS — Z9049 Acquired absence of other specified parts of digestive tract: Secondary | ICD-10-CM | POA: Diagnosis not present

## 2018-09-21 DIAGNOSIS — I480 Paroxysmal atrial fibrillation: Secondary | ICD-10-CM

## 2018-09-21 DIAGNOSIS — Z7901 Long term (current) use of anticoagulants: Secondary | ICD-10-CM | POA: Diagnosis not present

## 2018-09-21 DIAGNOSIS — K219 Gastro-esophageal reflux disease without esophagitis: Secondary | ICD-10-CM | POA: Insufficient documentation

## 2018-09-21 DIAGNOSIS — F4310 Post-traumatic stress disorder, unspecified: Secondary | ICD-10-CM | POA: Diagnosis not present

## 2018-09-21 DIAGNOSIS — F329 Major depressive disorder, single episode, unspecified: Secondary | ICD-10-CM | POA: Insufficient documentation

## 2018-09-21 NOTE — Progress Notes (Signed)
Primary Care Physician: Chipper Herb, MD Referring Physician: [facility] ER f/u   Joseph Larsen is a 72 y.o. male with a h/o ETOH  abuse, GERD, HTN,PTSD, that is in the afib clinic for f/u of recent new onset episode of afib. He related that he has felt episodes of elevated HR for  at least 3 weeks. He presented ot PCP office today and he was in afib. HE was sent to the ER but by the time he arrived, he had resumed SR. HE is in the SR today but brings in a chart showing episodes of elevated HR. He was started on eliquis 5 mg bid for a CHA2DS2VASc score of 2. He was already on atenolol. Resting HR is 51 bpm. He does not smoke, he does drink daliy alcohol. Wife denies snoring/apena. He tries to walk several times a week. He was also found to be hypokalemic and K+ was repleted and he was d/c on K+ 20 meq  bid. He is also on HCTZ.   He was seen and evaluated by Dr. Johnsie Cancel in May for irregular heart beat with normal myoview and echo. No afib on 48 hour monitor.  F/u in afib clinc 9/11. Since I divided his atenolol dose he has not had any further episodes of afib. He is happy to continue this approach for now.  F/u in afib clinic 10/11.He feels well. Afib quiet. Happy with current approach.  Today, he denies symptoms of palpitations, chest pain, shortness of breath, orthopnea, PND, lower extremity edema, dizziness, presyncope, syncope, or neurologic sequela. The patient is tolerating medications without difficulties and is otherwise without complaint today.   Past Medical History:  Diagnosis Date  . Abnormal ECG    a. 08/2012 Abnl ETT with ST changes in recovery;  b. 08/2012 Ex MV, ex time 10:00, EF 66%, no ischemia/infarct.  . Allergic rhinitis   . Anxiety   . Cataract   . Depression   . Diverticular disease   . Dyspnea on exertion   . ETOH abuse   . GERD (gastroesophageal reflux disease)   . Gout   . Hemorrhoids   . Hiatal hernia   . HTN (hypertension)   . Low testosterone   . MACROCYTIC  ANEMIA   . NONSPEC ELEVATION OF LEVELS OF TRANSAMINASE/LDH   . Nonspecific abnormal finding in stool contents   . Palpitations   . PTSD (post-traumatic stress disorder)   . PTSD (post-traumatic stress disorder)    [name]   Past Surgical History:  Procedure Laterality Date  . CHOLECYSTECTOMY  2004  . KNEE SURGERY Bilateral     Current Outpatient Medications  Medication Sig Dispense Refill  . allopurinol (ZYLOPRIM) 300 MG tablet Take 150 mg by mouth daily.     Marland Kitchen amLODipine (NORVASC) 10 MG tablet Take 5 mg by mouth daily.    Marland Kitchen apixaban (ELIQUIS) 5 MG TABS tablet Take 1 tablet (5 mg total) by mouth 2 (two) times daily. 60 tablet 0  . atenolol (TENORMIN) 25 MG tablet Take 1 tablet (25 mg total) by mouth 2 (two) times daily. 60 tablet 3  . buPROPion (WELLBUTRIN SR) 200 MG 12 hr tablet Take 200 mg by mouth daily.     . cholecalciferol (VITAMIN D) 1000 UNITS tablet Take 1,000 Units by mouth daily.    . cyanocobalamin (,VITAMIN B-12,) 1000 MCG/ML injection Inject 1 mL (1,000 mcg total) into the muscle every 30 (thirty) days. 3 mL 0  . diazepam (VALIUM) 10  MG tablet Take 1 tablet (10 mg total) by mouth every 8 (eight) hours as needed for anxiety. (Patient taking differently: Take 10 mg by mouth every 12 (twelve) hours as needed for anxiety. ) 90 tablet 1  . flunisolide (NASALIDE) 0.025 % SOLN Place 2 sprays into the nose 2 (two) times daily as needed (for allergies).     . hydrochlorothiazide (HYDRODIURIL) 25 MG tablet Take 25 mg by mouth daily.     . Misc Natural Products (OSTEO BI-FLEX/5-LOXIN ADVANCED PO) Take 2 tablets by mouth daily.     . Omega-3 Fatty Acids (FISH OIL) 1000 MG CAPS Take 4 capsules by mouth daily.     Marland Kitchen omeprazole (PRILOSEC) 20 MG capsule Take 40 mg by mouth 2 (two) times daily before a meal.     . QUEtiapine (SEROQUEL) 50 MG tablet Take 12.5 mg by mouth at bedtime.     . rosuvastatin (CRESTOR) 5 MG tablet Take 0.5 tab po three times weekly as directed (Patient  taking differently: Take 2.5 mg by mouth every Monday, Wednesday, and Friday. ) 20 tablet 3  . valsartan (DIOVAN) 320 MG tablet Take 160 mg by mouth daily.     Current Facility-Administered Medications  Medication Dose Route Frequency Provider Last Rate Last Dose  . cyanocobalamin ((VITAMIN B-12)) injection 1,000 mcg  1,000 mcg Intramuscular Q30 days Timmothy Euler, MD   1,000 mcg at 02/13/18 0932  . cyanocobalamin ((VITAMIN B-12)) injection 1,000 mcg  1,000 mcg Intramuscular Q30 days Chipper Herb, MD   1,000 mcg at 08/29/18 1016    Allergies  Allergen Reactions  . Enalapril Maleate     REACTION: rash  . Penicillins     REACTION: rash, itching  . Hytrin [Terazosin] Rash    Rash when in sun  . Zestril [Lisinopril]     Social History   Socioeconomic History  . Marital status: Married    Spouse name: Suanne Marker  . Number of children: 1  . Years of education: Not on file  . Highest education level: Not on file  Occupational History  . Occupation: disabled    Employer: RETIRED    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  Social Needs  . Financial resource strain: Not hard at all  . Food insecurity:    Worry: Never true    Inability: Never true  . Transportation needs:    Medical: Not on file    Non-medical: No  Tobacco Use  . Smoking status: Former Smoker    Packs/day: 3.00    Types: Cigarettes    Start date: 02/09/1965    Last attempt to quit: 05/12/1982    Years since quitting: 36.3  . Smokeless tobacco: Never Used  Substance and Sexual Activity  . Alcohol use: Yes    Alcohol/week: 14.0 standard drinks    Types: 14 Cans of beer per week    Comment: drinks 2-3 cans of beer/day    . Drug use: No    Comment: 1 cup of decaf coffee/day.  Marland Kitchen Sexual activity: Yes  Lifestyle  . Physical activity:    Days per week: 3 days    Minutes per session: 60 min  . Stress: Not at all  Relationships  . Social connections:    Talks on phone: More than three times a week     Gets together: More than three times a week    Attends religious service: Never    Active member of club or organization: No    Attends  meetings of clubs or organizations: Never    Relationship status: Married  . Intimate partner violence:    Fear of current or ex partner: No    Emotionally abused: No    Physically abused: No    Forced sexual activity: No  Other Topics Concern  . Not on file  Social History Narrative   Lives with wife in [place] in a one story home.  Exercises @ gym 3x/wk - stationary bike and light weight training.    Family History  Problem Relation Age of Onset  . Heart disease Mother        "died of chf"  . Heart disease Father        "died of old age"  . Hypertension Sister   . Liver disease Brother   . Hypertension Brother   . Hypertension Sister   . Hypertension Sister   . Hypertension Sister   . Heart murmur Sister   . Colon cancer Neg Hx     ROS- All systems are reviewed and negative except as per the HPI above  Physical Exam: Vitals:   09/21/18 0856  BP: 140/82  Pulse: (!) 57  Weight: 83 kg  Height: 5\' 11"  (1.803 m)   Wt Readings from Last 3 Encounters:  09/21/18 83 kg  08/31/18 83.5 kg  08/29/18 83.9 kg    Labs: Lab Results  Component Value Date   NA 141 08/31/2018   K 3.8 08/31/2018   CL 98 08/31/2018   CO2 27 08/31/2018   GLUCOSE 73 08/31/2018   BUN 9 08/31/2018   CREATININE 0.79 08/31/2018   CALCIUM 9.4 08/31/2018   MG 1.9 08/14/2018   Lab Results  Component Value Date   INR 1.1 ratio (H) 08/09/2010   Lab Results  Component Value Date   CHOL 88 (L) 08/31/2018   HDL 33 (L) 08/31/2018   LDLCALC 48 08/31/2018   TRIG 36 08/31/2018     GEN- The patient is well appearing, alert and oriented x 3 today.   Head- normocephalic, atraumatic Eyes-  Sclera clear, conjunctiva pink Ears- hearing intact Oropharynx- clear Neck- supple, no JVP Lymph- no cervical lymphadenopathy Lungs- Clear to ausculation bilaterally,  normal work of breathing Heart- Regular rate and rhythm, no murmurs, rubs or gallops, PMI not laterally displaced GI- soft, NT, ND, + BS Extremities- no clubbing, cyanosis, or edema MS- no significant deformity or atrophy Skin- no rash or lesion Psych- euthymic mood, full affect Neuro- strength and sensation are intact  EKG-Sinus brady at 57, with first degree block,  bpm, PR int 212 ms, qrs int 94 ms, qtc 428 ms Epic records reviewed Echo-Study Conclusions  - Left ventricle: The cavity size was normal. Wall thickness was   increased increased in a pattern of mild to moderate LVH.   Systolic function was normal. The estimated ejection fraction was   in the range of 60% to 65%. Wall motion was normal; there were no   regional wall motion abnormalities. Indeterminate diastolic   function. - Aortic valve: Trileaflet; mildly calcified leaflets. - Right atrium: Central venous pressure (est): 8 mm Hg. - Atrial septum: No defect or patent foramen ovale was identified. - Tricuspid valve: There was trivial regurgitation. - Pulmonary arteries: Systolic pressure could not be accurately   estimated. - Pericardium, extracardiac: There was no pericardial effusion.  Stress test-Notes recorded by Josue Hector, MD on 05/15/2018 at 1:54 PM EDT Normal myovue study with no evidence of ischemia or infarction  48 hour  monitor- 04/2018- Did not show any afib  Assessment and Plan: 1. New onset Paroxysmal afib Currently afib is quiet and pt is happy with approach  Advised to reduce alcohol to no more than 2 drinks a week He was taking 50 mg atenolol daily and I did not feel I could go up on the dose for HR of 51, so I   split the dose to 25 mg bid for better coverage of BB for afib episodes This seems to be controlling  afib for now  If burden increases will ad  AAD   2. HTN Stable  3. Chadsvasc score of 2 Continue eliquis 5 mg bid   F/u as needed  Butch Penny C. Carroll, [facility] [address] [place], [place] [address] [phone]

## 2018-10-25 ENCOUNTER — Ambulatory Visit (INDEPENDENT_AMBULATORY_CARE_PROVIDER_SITE_OTHER): Payer: Medicare Other | Admitting: Pediatrics

## 2018-10-25 ENCOUNTER — Encounter: Payer: Self-pay | Admitting: Pediatrics

## 2018-10-25 VITALS — BP 131/84 | HR 52 | Temp 97.2°F | Ht 71.0 in | Wt 175.6 lb

## 2018-10-25 DIAGNOSIS — N139 Obstructive and reflux uropathy, unspecified: Secondary | ICD-10-CM | POA: Diagnosis not present

## 2018-10-25 DIAGNOSIS — R399 Unspecified symptoms and signs involving the genitourinary system: Secondary | ICD-10-CM | POA: Diagnosis not present

## 2018-10-25 LAB — URINALYSIS, COMPLETE
BILIRUBIN UA: NEGATIVE
Glucose, UA: NEGATIVE
Ketones, UA: NEGATIVE
Nitrite, UA: NEGATIVE
PH UA: 7 (ref 5.0–7.5)
PROTEIN UA: NEGATIVE
RBC UA: NEGATIVE
Specific Gravity, UA: 1.015 (ref 1.005–1.030)
Urobilinogen, Ur: 0.2 mg/dL (ref 0.2–1.0)

## 2018-10-25 LAB — MICROSCOPIC EXAMINATION
BACTERIA UA: NONE SEEN
RBC, UA: NONE SEEN /hpf (ref 0–2)
RENAL EPITHEL UA: NONE SEEN /HPF

## 2018-10-25 MED ORDER — FINASTERIDE 5 MG PO TABS: 5.0000 mg | ORAL_TABLET | Freq: Every day | ORAL | 1 refills | Status: DC

## 2018-10-25 MED ORDER — FINASTERIDE 5 MG PO TABS: 5.0000 mg | ORAL_TABLET | Freq: Every day | ORAL | 0 refills | Status: DC

## 2018-10-25 NOTE — Progress Notes (Signed)
  Subjective:   Patient ID: Joseph Larsen, male    DOB: 08-29-1946, 72 y.o.   MRN: [id number] CC: Urinary Frequency and Urinary Urgency  HPI: Joseph Larsen is a 72 y.o. male   Symptoms off and on for past three months.  Getting up to go to the bathroom at night 4-5 times.  Often feels like he cannot fully empty his bladder.  Has to change positions sometimes to better empty his bladder.  Some days this is worse than others.  He often has symptoms during the daytime as well.  Says he has been told he has an enlarged prostate in the past.  He is not taking any medicines for his prostate.  Last PSA in 2016 was normal.  No dysuria, no fevers.  Normal appetite.  No abdominal pain.  Drinking 6-8 bottles of water in a day.  Stopped alcohol use with diagnosis of atrial fibrillation.  Drinking about 2 bottles of water after dinner.  Relevant past medical, surgical, family and social history reviewed. Allergies and medications reviewed and updated. Social History   Tobacco Use  Smoking Status Former Smoker  . Packs/day: 3.00  . Types: Cigarettes  . Start date: 02/09/1965  . Last attempt to quit: 05/12/1982  . Years since quitting: 36.4  Smokeless Tobacco Never Used   ROS: Per HPI   Objective:    BP 131/84   Pulse (!) 52   Temp (!) 97.2 F (36.2 C) (Oral)   Ht 5\' 11"  (1.803 m)   Wt 175 lb 9.6 oz (79.7 kg)   BMI 24.49 kg/m   Wt Readings from Last 3 Encounters:  10/25/18 175 lb 9.6 oz (79.7 kg)  09/21/18 183 lb (83 kg)  08/31/18 184 lb (83.5 kg)    Gen: NAD, alert, cooperative with exam, NCAT EYES: EOMI, no conjunctival injection, or no icterus CV: NRRR, normal S1/S2, no murmur, distal pulses 2+ b/l Resp: CTABL, no wheezes, normal WOB Abd: +BS, soft, NTND. no guarding or organomegaly Ext: No edema, warm Neuro: Alert and oriented  Assessment & Plan:  Joseph Larsen was seen today for urinary frequency and urinary urgency.  Diagnoses and all orders for this visit:  UTI symptoms A few WBCs in  urine, negative nitrites.  We will follow-up culture. -     Urinalysis, Complete -     Urine Culture; Future  Lower urinary obstructive symptom Repeat PSA.  Start finasteride because of allergy to alternate.  If symptoms not improving, he continues to feel like he is not fully empty his bladder, let us know needs to see urology.  Recommended decreasing water intake after dinner. -     PSA, total and free -     finasteride (PROSCAR) 5 MG tablet; Take 1 tablet (5 mg total) by mouth daily.   Follow up plan: As scheduled with PCP, sooner if needed Assunta Found, MD [place]

## 2018-10-25 NOTE — Addendum Note (Signed)
Addended by: Liliane Bade on: 10/25/2018 06:11 PM   Modules accepted: Orders

## 2018-10-26 LAB — PSA, TOTAL AND FREE
PROSTATE SPECIFIC AG, SERUM: 0.8 ng/mL (ref 0.0–4.0)
PSA FREE PCT: 26.3 %
PSA FREE: 0.21 ng/mL

## 2018-10-29 ENCOUNTER — Other Ambulatory Visit: Payer: Self-pay | Admitting: Pediatrics

## 2018-10-29 DIAGNOSIS — N3000 Acute cystitis without hematuria: Secondary | ICD-10-CM

## 2018-10-29 LAB — URINE CULTURE

## 2018-10-29 MED ORDER — NITROFURANTOIN MONOHYD MACRO 100 MG PO CAPS: 100.0000 mg | ORAL_CAPSULE | Freq: Two times a day (BID) | ORAL | 0 refills | Status: AC

## 2018-11-07 ENCOUNTER — Telehealth: Payer: Self-pay | Admitting: Family Medicine

## 2018-11-07 DIAGNOSIS — N139 Obstructive and reflux uropathy, unspecified: Secondary | ICD-10-CM

## 2018-11-07 MED ORDER — FINASTERIDE 5 MG PO TABS: 5.0000 mg | ORAL_TABLET | Freq: Every day | ORAL | 3 refills | Status: DC

## 2018-11-07 NOTE — Telephone Encounter (Signed)
Please advise on refills for finasteride.

## 2018-11-07 NOTE — Telephone Encounter (Signed)
The finasteride is okay to refill.

## 2018-11-07 NOTE — Telephone Encounter (Signed)
done

## 2018-11-15 DIAGNOSIS — M25511 Pain in right shoulder: Secondary | ICD-10-CM | POA: Diagnosis not present

## 2018-12-01 ENCOUNTER — Encounter: Payer: Self-pay | Admitting: Family Medicine

## 2018-12-01 ENCOUNTER — Ambulatory Visit (INDEPENDENT_AMBULATORY_CARE_PROVIDER_SITE_OTHER): Payer: Medicare Other | Admitting: Family Medicine

## 2018-12-01 VITALS — BP 135/72 | HR 77 | Temp 97.3°F | Ht 71.0 in | Wt 176.0 lb

## 2018-12-01 DIAGNOSIS — R3914 Feeling of incomplete bladder emptying: Secondary | ICD-10-CM

## 2018-12-01 DIAGNOSIS — R300 Dysuria: Secondary | ICD-10-CM | POA: Diagnosis not present

## 2018-12-01 DIAGNOSIS — N401 Enlarged prostate with lower urinary tract symptoms: Secondary | ICD-10-CM | POA: Diagnosis not present

## 2018-12-01 MED ORDER — TAMSULOSIN HCL 0.4 MG PO CAPS: 0.4000 mg | ORAL_CAPSULE | Freq: Every day | ORAL | 3 refills | Status: DC

## 2018-12-01 NOTE — Progress Notes (Signed)
BP 135/72   Pulse 77   Temp (!) 97.3 F (36.3 C) (Oral)   Ht 5\' 11"  (1.803 m)   Wt 176 lb (79.8 kg)   BMI 24.55 kg/m    Subjective:    Patient ID: Joseph Larsen, male    DOB: 1946-04-10, 72 y.o.   MRN: [id number]  HPI: Joseph Larsen is a 72 y.o. male presenting on 12/01/2018 for Sudden urge to urinate but when he gets to bathroom feels he (taking Finasteride; would like urology referral)   HPI Urinary urgency and strain weakness and difficulty emptying bladder completely that has been increasing over the past year but is really picked up over the past couple weeks.  He has been taking his finasteride but is to the point where it is getting bad enough that he wants to go see a urologist because it is just not where it needs to be.  He denies any abdominal pain or flank pain or fevers or chills or hematuria.  Relevant past medical, surgical, family and social history reviewed and updated as indicated. Interim medical history since our last visit reviewed. Allergies and medications reviewed and updated.  Review of Systems  Constitutional: Negative for chills and fever.  Respiratory: Negative for shortness of breath and wheezing.   Cardiovascular: Negative for chest pain and leg swelling.  Gastrointestinal: Negative for abdominal pain.  Genitourinary: Positive for decreased urine volume, difficulty urinating, frequency and urgency.  Musculoskeletal: Negative for back pain and gait problem.  Skin: Negative for rash.  All other systems reviewed and are negative.   Per HPI unless specifically indicated above   Allergies as of 12/01/2018      Reactions   Enalapril Maleate    REACTION: rash   Penicillins    REACTION: rash, itching   Hytrin [terazosin] Rash   Rash when in sun   Zestril [lisinopril]       Medication List       Accurate as of December 01, 2018  9:56 AM. Always use your most recent med list.        allopurinol 300 MG tablet Commonly known as:   ZYLOPRIM Take 150 mg by mouth daily.   amLODipine 10 MG tablet Commonly known as:  NORVASC Take 5 mg by mouth daily.   apixaban 5 MG Tabs tablet Commonly known as:  ELIQUIS Take 1 tablet (5 mg total) by mouth 2 (two) times daily.   atenolol 25 MG tablet Commonly known as:  TENORMIN Take 1 tablet (25 mg total) by mouth 2 (two) times daily.   buPROPion 200 MG 12 hr tablet Commonly known as:  WELLBUTRIN SR Take 200 mg by mouth daily.   cholecalciferol 1000 units tablet Commonly known as:  VITAMIN D Take 1,000 Units by mouth daily.   cyanocobalamin 1000 MCG/ML injection Commonly known as:  (VITAMIN B-12) Inject 1 mL (1,000 mcg total) into the muscle every 30 (thirty) days.   diazepam 10 MG tablet Commonly known as:  VALIUM Take 1 tablet (10 mg total) by mouth every 8 (eight) hours as needed for anxiety.   finasteride 5 MG tablet Commonly known as:  PROSCAR Take 1 tablet (5 mg total) by mouth daily.   Fish Oil 1000 MG Caps Take 4 capsules by mouth daily.   flunisolide 25 MCG/ACT (0.025%) Soln Commonly known as:  NASALIDE Place 2 sprays into the nose 2 (two) times daily as needed (for allergies).   hydrochlorothiazide 25 MG tablet Commonly known as:  HYDRODIURIL Take 25 mg by mouth daily.   omeprazole 20 MG capsule Commonly known as:  PRILOSEC Take 40 mg by mouth 2 (two) times daily before a meal.   OSTEO BI-FLEX/5-LOXIN ADVANCED PO Take 2 tablets by mouth daily.   QUEtiapine 50 MG tablet Commonly known as:  SEROQUEL Take 12.5 mg by mouth at bedtime.   rosuvastatin 5 MG tablet Commonly known as:  CRESTOR Take 0.5 tab po three times weekly as directed   tamsulosin 0.4 MG Caps capsule Commonly known as:  FLOMAX Take 1 capsule (0.4 mg total) by mouth daily.   valsartan 320 MG tablet Commonly known as:  DIOVAN Take 160 mg by mouth daily.          Objective:    BP 135/72   Pulse 77   Temp (!) 97.3 F (36.3 C) (Oral)   Ht 5\' 11"  (1.803 m)   Wt 176  lb (79.8 kg)   BMI 24.55 kg/m   Wt Readings from Last 3 Encounters:  12/01/18 176 lb (79.8 kg)  10/25/18 175 lb 9.6 oz (79.7 kg)  09/21/18 183 lb (83 kg)    Physical Exam Vitals signs and nursing note reviewed.  Constitutional:      General: He is not in acute distress.    Appearance: He is well-developed. He is not diaphoretic.  Eyes:     General: No scleral icterus.    Conjunctiva/sclera: Conjunctivae normal.  Neck:     Thyroid: No thyromegaly.  Abdominal:     General: Abdomen is flat. There is no distension.     Tenderness: There is no abdominal tenderness.  Musculoskeletal: Normal range of motion.  Skin:    General: Skin is warm and dry.     Findings: No rash.  Neurological:     Mental Status: He is alert and oriented to person, place, and time.     Coordination: Coordination normal.  Psychiatric:        Behavior: Behavior normal.         Assessment & Plan:   Problem List Items Addressed This Visit      Genitourinary   BPH (benign prostatic hyperplasia) - Primary   Relevant Medications   tamsulosin (FLOMAX) 0.4 MG CAPS capsule   Other Relevant Orders   Urinalysis   Ambulatory referral to Urology       Follow up plan: Return if symptoms worsen or fail to improve.  Counseling provided for all of the vaccine components Orders Placed This Encounter  Procedures  . Urinalysis  . Ambulatory referral to Urology    Caryl Pina, MD [place] [facility] 12/01/2018, 9:56 AM

## 2018-12-03 LAB — URINALYSIS
BILIRUBIN UA: NEGATIVE
Glucose, UA: NEGATIVE
Ketones, UA: NEGATIVE
Nitrite, UA: NEGATIVE
PH UA: 7 (ref 5.0–7.5)
Protein, UA: NEGATIVE
RBC UA: NEGATIVE
SPEC GRAV UA: 1.015 (ref 1.005–1.030)
Urobilinogen, Ur: 0.2 mg/dL (ref 0.2–1.0)

## 2018-12-18 ENCOUNTER — Encounter: Payer: Self-pay | Admitting: Family Medicine

## 2018-12-18 ENCOUNTER — Ambulatory Visit (INDEPENDENT_AMBULATORY_CARE_PROVIDER_SITE_OTHER): Payer: Medicare Other | Admitting: Family Medicine

## 2018-12-18 VITALS — BP 115/60 | HR 54 | Temp 97.2°F | Ht 71.0 in | Wt 181.5 lb

## 2018-12-18 DIAGNOSIS — J40 Bronchitis, not specified as acute or chronic: Secondary | ICD-10-CM | POA: Diagnosis not present

## 2018-12-18 DIAGNOSIS — J329 Chronic sinusitis, unspecified: Secondary | ICD-10-CM | POA: Diagnosis not present

## 2018-12-18 MED ORDER — AZITHROMYCIN 250 MG PO TABS: ORAL_TABLET | ORAL | 0 refills | Status: DC

## 2018-12-18 NOTE — Progress Notes (Signed)
Chief Complaint  Patient presents with  . Cough    pt here today c/o cough, congestion, itchy throat and "head congested"    HPI  Patient presents today for Symptoms include congestion, facial pain, nasal congestion, some yellow productive cough, post nasal drip . There is no fever, chills, or sweats. Onset of symptoms was a few days ago, gradually worsening since that time.    PMH: Smoking status noted ROS: Per HPI  Objective: BP 115/60   Pulse (!) 54   Temp (!) 97.2 F (36.2 C) (Oral)   Ht 5\' 11"  (1.803 m)   Wt 181 lb 8 oz (82.3 kg)   BMI 25.31 kg/m  Gen: NAD, alert, cooperative with exam HEENT: NCAT, EOMI, PERRL. Nasal passages erythematous CV: RRR, good S1/S2, no murmur Resp: bronchitic changes throughout Abd: SNTND, BS present, no guarding or organomegaly Ext: No edema, warm Neuro: Alert and oriented, No gross deficits  Assessment and plan:  1. Sinobronchitis     Meds ordered this encounter  Medications  . azithromycin (ZITHROMAX Z-PAK) 250 MG tablet    Sig: Take two right away Then one a day for the next 4 days.    Dispense:  6 each    Refill:  0    Mucinex Max prn sx.  Follow up as needed.  Claretta Fraise, MD

## 2018-12-21 ENCOUNTER — Encounter: Payer: Self-pay | Admitting: Physician Assistant

## 2018-12-21 ENCOUNTER — Ambulatory Visit (INDEPENDENT_AMBULATORY_CARE_PROVIDER_SITE_OTHER): Payer: Medicare Other | Admitting: Physician Assistant

## 2018-12-21 VITALS — BP 110/67 | HR 72 | Temp 99.2°F | Ht 71.0 in | Wt 178.0 lb

## 2018-12-21 DIAGNOSIS — R6889 Other general symptoms and signs: Secondary | ICD-10-CM | POA: Diagnosis not present

## 2018-12-21 DIAGNOSIS — J209 Acute bronchitis, unspecified: Secondary | ICD-10-CM

## 2018-12-21 MED ORDER — ALBUTEROL SULFATE HFA 108 (90 BASE) MCG/ACT IN AERS: 2.0000 | INHALATION_SPRAY | Freq: Four times a day (QID) | RESPIRATORY_TRACT | 0 refills | Status: DC | PRN

## 2018-12-21 MED ORDER — PREDNISONE 10 MG (48) PO TBPK: ORAL_TABLET | ORAL | 0 refills | Status: DC

## 2018-12-21 MED ORDER — LEVOFLOXACIN 500 MG PO TABS: 500.0000 mg | ORAL_TABLET | Freq: Every day | ORAL | 0 refills | Status: DC

## 2018-12-21 MED ORDER — OSELTAMIVIR PHOSPHATE 75 MG PO CAPS: 75.0000 mg | ORAL_CAPSULE | Freq: Two times a day (BID) | ORAL | 0 refills | Status: DC

## 2018-12-21 NOTE — Progress Notes (Signed)
BP 110/67 (BP Location: Left Arm)   Pulse 72   Temp 99.2 F (37.3 C) (Oral)   Ht 5\' 11"  (1.803 m)   Wt 178 lb (80.7 kg)   BMI 24.83 kg/m    Subjective:    Patient ID: Joseph Larsen, male    DOB: March 27, 1946, 73 y.o.   MRN: [id number]  HPI: Joseph Larsen is a 73 y.o. male presenting on 12/21/2018 for URI (head cold, congestion, sinus, pressure, ST, drainage - vomiting, and diarrhea )  This patient has had less than 2 days severe fever, chills, myalgias.  Complains of sinus headache and postnasal drainage. There is copious drainage at times. Associated sore throat, decreased appetite and headache.  Has been exposed to influenza.    Past Medical History:  Diagnosis Date  . Abnormal ECG    a. 08/2012 Abnl ETT with ST changes in recovery;  b. 08/2012 Ex MV, ex time 10:00, EF 66%, no ischemia/infarct.  . Allergic rhinitis   . Anxiety   . Cataract   . Depression   . Diverticular disease   . Dyspnea on exertion   . ETOH abuse   . GERD (gastroesophageal reflux disease)   . Gout   . Hemorrhoids   . Hiatal hernia   . HTN (hypertension)   . Low testosterone   . MACROCYTIC ANEMIA   . NONSPEC ELEVATION OF LEVELS OF TRANSAMINASE/LDH   . Nonspecific abnormal finding in stool contents   . Palpitations   . PTSD (post-traumatic stress disorder)   . PTSD (post-traumatic stress disorder)    [name]   Relevant past medical, surgical, family and social history reviewed and updated as indicated. Interim medical history since our last visit reviewed. Allergies and medications reviewed and updated. DATA REVIEWED: CHART IN EPIC  Family History reviewed for pertinent findings.  Review of Systems  Constitutional: Positive for activity change, fatigue and fever. Negative for appetite change.  HENT: Positive for congestion and sore throat. Negative for sinus pressure.   Eyes: Negative.  Negative for pain and visual disturbance.  Respiratory: Positive for cough and wheezing. Negative for  chest tightness and shortness of breath.   Cardiovascular: Negative.  Negative for chest pain, palpitations and leg swelling.  Gastrointestinal: Positive for nausea. Negative for abdominal pain, diarrhea and vomiting.  Endocrine: Negative.   Genitourinary: Negative.   Musculoskeletal: Positive for back pain and myalgias. Negative for arthralgias.  Skin: Negative.  Negative for color change and rash.  Neurological: Positive for headaches. Negative for weakness and numbness.  Psychiatric/Behavioral: Negative.     Allergies as of 12/21/2018      Reactions   Enalapril Maleate    REACTION: rash   Penicillins    REACTION: rash, itching   Hytrin [terazosin] Rash   Rash when in sun   Zestril [lisinopril]       Medication List       Accurate as of December 21, 2018 11:59 PM. Always use your most recent med list.        albuterol 108 (90 Base) MCG/ACT inhaler Commonly known as:  PROVENTIL HFA;VENTOLIN HFA Inhale 2 puffs into the lungs every 6 (six) hours as needed for wheezing or shortness of breath.   allopurinol 300 MG tablet Commonly known as:  ZYLOPRIM Take 150 mg by mouth daily.   amLODipine 10 MG tablet Commonly known as:  NORVASC Take 5 mg by mouth daily.   apixaban 5 MG Tabs tablet Commonly known as:  ELIQUIS  Take 1 tablet (5 mg total) by mouth 2 (two) times daily.   atenolol 25 MG tablet Commonly known as:  TENORMIN Take 1 tablet (25 mg total) by mouth 2 (two) times daily.   azithromycin 250 MG tablet Commonly known as:  ZITHROMAX Z-PAK Take two right away Then one a day for the next 4 days.   buPROPion 200 MG 12 hr tablet Commonly known as:  WELLBUTRIN SR Take 200 mg by mouth daily.   cholecalciferol 1000 units tablet Commonly known as:  VITAMIN D Take 1,000 Units by mouth daily.   cyanocobalamin 1000 MCG/ML injection Commonly known as:  (VITAMIN B-12) Inject 1 mL (1,000 mcg total) into the muscle every 30 (thirty) days.   diazepam 10 MG tablet Commonly  known as:  VALIUM Take 1 tablet (10 mg total) by mouth every 8 (eight) hours as needed for anxiety.   finasteride 5 MG tablet Commonly known as:  PROSCAR Take 1 tablet (5 mg total) by mouth daily.   Fish Oil 1000 MG Caps Take 4 capsules by mouth daily.   flunisolide 25 MCG/ACT (0.025%) Soln Commonly known as:  NASALIDE Place 2 sprays into the nose 2 (two) times daily as needed (for allergies).   hydrochlorothiazide 25 MG tablet Commonly known as:  HYDRODIURIL Take 25 mg by mouth daily.   levofloxacin 500 MG tablet Commonly known as:  LEVAQUIN Take 1 tablet (500 mg total) by mouth daily.   omeprazole 20 MG capsule Commonly known as:  PRILOSEC Take 40 mg by mouth 2 (two) times daily before a meal.   oseltamivir 75 MG capsule Commonly known as:  TAMIFLU Take 1 capsule (75 mg total) by mouth 2 (two) times daily.   OSTEO BI-FLEX/5-LOXIN ADVANCED PO Take 2 tablets by mouth daily.   predniSONE 10 MG (48) Tbpk tablet Commonly known as:  STERAPRED UNI-PAK 48 TAB Take as directed for 12 days   QUEtiapine 50 MG tablet Commonly known as:  SEROQUEL Take 12.5 mg by mouth at bedtime.   rosuvastatin 5 MG tablet Commonly known as:  CRESTOR Take 0.5 tab po three times weekly as directed   tamsulosin 0.4 MG Caps capsule Commonly known as:  FLOMAX Take 1 capsule (0.4 mg total) by mouth daily.   valsartan 320 MG tablet Commonly known as:  DIOVAN Take 160 mg by mouth daily.          Objective:    BP 110/67 (BP Location: Left Arm)   Pulse 72   Temp 99.2 F (37.3 C) (Oral)   Ht 5\' 11"  (1.803 m)   Wt 178 lb (80.7 kg)   BMI 24.83 kg/m   Allergies  Allergen Reactions  . Enalapril Maleate     REACTION: rash  . Penicillins     REACTION: rash, itching  . Hytrin [Terazosin] Rash    Rash when in sun  . Zestril [Lisinopril]     Wt Readings from Last 3 Encounters:  12/21/18 178 lb (80.7 kg)  12/18/18 181 lb 8 oz (82.3 kg)  12/01/18 176 lb (79.8 kg)    Physical  Exam Constitutional:      Appearance: He is well-developed.  HENT:     Head: Normocephalic and atraumatic.     Right Ear: Hearing and tympanic membrane normal.     Left Ear: Hearing and tympanic membrane normal.     Nose: Mucosal edema present. No nasal deformity.     Right Sinus: Frontal sinus tenderness present.     Left Sinus:  Frontal sinus tenderness present.     Mouth/Throat:     Pharynx: Posterior oropharyngeal erythema present.  Eyes:     General:        Right eye: No discharge.        Left eye: No discharge.     Conjunctiva/sclera: Conjunctivae normal.     Pupils: Pupils are equal, round, and reactive to light.  Neck:     Musculoskeletal: Normal range of motion and neck supple.  Cardiovascular:     Rate and Rhythm: Normal rate and regular rhythm.     Heart sounds: Normal heart sounds.  Pulmonary:     Effort: Pulmonary effort is normal. No respiratory distress.     Breath sounds: Wheezing present. No decreased breath sounds, rhonchi or rales.  Abdominal:     General: Bowel sounds are normal.     Palpations: Abdomen is soft.  Musculoskeletal: Normal range of motion.  Skin:    General: Skin is warm and dry.     Results for orders placed or performed in visit on 12/01/18  Urinalysis  Result Value Ref Range   Specific Gravity, UA 1.015 1.005 - 1.030   pH, UA 7.0 5.0 - 7.5   Color, UA Yellow Yellow   Appearance Ur Clear Clear   Leukocytes, UA Trace (A) Negative   Protein, UA Negative Negative/Trace   Glucose, UA Negative Negative   Ketones, UA Negative Negative   RBC, UA Negative Negative   Bilirubin, UA Negative Negative   Urobilinogen, Ur 0.2 0.2 - 1.0 mg/dL   Nitrite, UA Negative Negative      Assessment & Plan:   1. Flu-like symptoms - Veritor Flu A/B Waived - oseltamivir (TAMIFLU) 75 MG capsule; Take 1 capsule (75 mg total) by mouth 2 (two) times daily.  Dispense: 10 capsule; Refill: 0  2. Bronchitis, acute, with bronchospasm - levofloxacin (LEVAQUIN)  500 MG tablet; Take 1 tablet (500 mg total) by mouth daily.  Dispense: 7 tablet; Refill: 0 - predniSONE (STERAPRED UNI-PAK 48 TAB) 10 MG (48) TBPK tablet; Take as directed for 12 days  Dispense: 48 tablet; Refill: 0 - albuterol (PROVENTIL HFA;VENTOLIN HFA) 108 (90 Base) MCG/ACT inhaler; Inhale 2 puffs into the lungs every 6 (six) hours as needed for wheezing or shortness of breath.  Dispense: 1 Inhaler; Refill: 0   Continue all other maintenance medications as listed above.  Follow up plan: No follow-ups on file.  Educational handout given for [place] PA-C [facility] [address]  [place], [place] [id number] [phone]   12/22/2018, 11:31 AM

## 2018-12-31 ENCOUNTER — Encounter: Payer: Self-pay | Admitting: Physician Assistant

## 2018-12-31 ENCOUNTER — Ambulatory Visit (INDEPENDENT_AMBULATORY_CARE_PROVIDER_SITE_OTHER): Payer: Medicare Other | Admitting: Physician Assistant

## 2018-12-31 VITALS — BP 132/72 | HR 49 | Temp 96.6°F | Ht 71.0 in | Wt 175.8 lb

## 2018-12-31 DIAGNOSIS — K22710 Barrett's esophagus with low grade dysplasia: Secondary | ICD-10-CM

## 2018-12-31 DIAGNOSIS — R05 Cough: Secondary | ICD-10-CM

## 2018-12-31 DIAGNOSIS — N401 Enlarged prostate with lower urinary tract symptoms: Secondary | ICD-10-CM

## 2018-12-31 DIAGNOSIS — K219 Gastro-esophageal reflux disease without esophagitis: Secondary | ICD-10-CM

## 2018-12-31 DIAGNOSIS — E538 Deficiency of other specified B group vitamins: Secondary | ICD-10-CM | POA: Diagnosis not present

## 2018-12-31 DIAGNOSIS — R3914 Feeling of incomplete bladder emptying: Secondary | ICD-10-CM

## 2018-12-31 DIAGNOSIS — R059 Cough, unspecified: Secondary | ICD-10-CM

## 2018-12-31 MED ORDER — TAMSULOSIN HCL 0.4 MG PO CAPS: 0.4000 mg | ORAL_CAPSULE | Freq: Every day | ORAL | 3 refills | Status: DC

## 2018-12-31 MED ORDER — DOXYCYCLINE HYCLATE 100 MG PO TABS: 100.0000 mg | ORAL_TABLET | Freq: Two times a day (BID) | ORAL | 0 refills | Status: DC

## 2019-01-03 NOTE — Progress Notes (Signed)
BP 132/72   Pulse (!) 49   Temp (!) 96.6 F (35.9 C) (Oral)   Ht 5\' 11"  (1.803 m)   Wt 175 lb 12.8 oz (79.7 kg)   BMI 24.52 kg/m    Subjective:    Patient ID: Joseph Larsen, male    DOB: 31-Dec-1945, 73 y.o.   MRN: [id number]  HPI: WIL SLAPE is a 73 y.o. male presenting on 12/31/2018 for Cough and Nasal Congestion  A this patient comes in for recheck on his recent bronchitis and pneumonia.  He states that he is mostly better however he still has a significant amount of cough and always feels that something is in the back of his throat.  He does have known GERD.  He does think that this is more out of control.  He had a long conversation about the possibility of his GERD being the aggravating factor for this recurrent bronchitis and upper respiratory symptoms.  He has not been to gastroenterology in a while and I think this will be an appropriate time for him to go with meds and adjustments in this medicine will symptom refill sent and will follow him as needed.  Past Medical History:  Diagnosis Date  . Abnormal ECG    a. 08/2012 Abnl ETT with ST changes in recovery;  b. 08/2012 Ex MV, ex time 10:00, EF 66%, no ischemia/infarct.  . Allergic rhinitis   . Anxiety   . Cataract   . Depression   . Diverticular disease   . Dyspnea on exertion   . ETOH abuse   . GERD (gastroesophageal reflux disease)   . Gout   . Hemorrhoids   . Hiatal hernia   . HTN (hypertension)   . Low testosterone   . MACROCYTIC ANEMIA   . NONSPEC ELEVATION OF LEVELS OF TRANSAMINASE/LDH   . Nonspecific abnormal finding in stool contents   . Palpitations   . PTSD (post-traumatic stress disorder)   . PTSD (post-traumatic stress disorder)    [name]   Relevant past medical, surgical, family and social history reviewed and updated as indicated. Interim medical history since our last visit reviewed. Allergies and medications reviewed and updated. DATA REVIEWED: CHART IN EPIC  Family History reviewed  for pertinent findings.  Review of Systems  Constitutional: Positive for fatigue. Negative for appetite change and fever.  HENT: Positive for sinus pressure and sore throat.   Eyes: Negative.  Negative for pain and visual disturbance.  Respiratory: Positive for cough. Negative for chest tightness, shortness of breath and wheezing.   Cardiovascular: Negative.  Negative for chest pain, palpitations and leg swelling.  Gastrointestinal: Negative.  Negative for abdominal pain, diarrhea, nausea and vomiting.  Endocrine: Negative.   Genitourinary: Negative.   Musculoskeletal: Negative for back pain and myalgias.  Skin: Negative.  Negative for color change and rash.  Neurological: Negative for weakness, numbness and headaches.  Psychiatric/Behavioral: Negative.     Allergies as of 12/31/2018      Reactions   Enalapril Maleate    REACTION: rash   Penicillins    REACTION: rash, itching   Hytrin [terazosin] Rash   Rash when in sun   Zestril [lisinopril]       Medication List       Accurate as of December 31, 2018 11:59 PM. Always use your most recent med list.        albuterol 108 (90 Base) MCG/ACT inhaler Commonly known as:  PROVENTIL HFA;VENTOLIN HFA Inhale 2  puffs into the lungs every 6 (six) hours as needed for wheezing or shortness of breath.   allopurinol 300 MG tablet Commonly known as:  ZYLOPRIM Take 150 mg by mouth daily.   amLODipine 10 MG tablet Commonly known as:  NORVASC Take 5 mg by mouth daily.   apixaban 5 MG Tabs tablet Commonly known as:  ELIQUIS Take 1 tablet (5 mg total) by mouth 2 (two) times daily.   atenolol 25 MG tablet Commonly known as:  TENORMIN Take 1 tablet (25 mg total) by mouth 2 (two) times daily.   buPROPion 200 MG 12 hr tablet Commonly known as:  WELLBUTRIN SR Take 200 mg by mouth daily.   cholecalciferol 1000 units tablet Commonly known as:  VITAMIN D Take 1,000 Units by mouth daily.   cyanocobalamin 1000 MCG/ML injection Commonly  known as:  (VITAMIN B-12) Inject 1 mL (1,000 mcg total) into the muscle every 30 (thirty) days.   diazepam 10 MG tablet Commonly known as:  VALIUM Take 1 tablet (10 mg total) by mouth every 8 (eight) hours as needed for anxiety.   doxycycline 100 MG tablet Commonly known as:  VIBRA-TABS Take 1 tablet (100 mg total) by mouth 2 (two) times daily. 1 po bid   finasteride 5 MG tablet Commonly known as:  PROSCAR Take 1 tablet (5 mg total) by mouth daily.   Fish Oil 1000 MG Caps Take 4 capsules by mouth daily.   flunisolide 25 MCG/ACT (0.025%) Soln Commonly known as:  NASALIDE Place 2 sprays into the nose 2 (two) times daily as needed (for allergies).   hydrochlorothiazide 25 MG tablet Commonly known as:  HYDRODIURIL Take 25 mg by mouth daily.   levofloxacin 500 MG tablet Commonly known as:  LEVAQUIN Take 1 tablet (500 mg total) by mouth daily.   omeprazole 20 MG capsule Commonly known as:  PRILOSEC Take 40 mg by mouth 2 (two) times daily before a meal.   oseltamivir 75 MG capsule Commonly known as:  TAMIFLU Take 1 capsule (75 mg total) by mouth 2 (two) times daily.   OSTEO BI-FLEX/5-LOXIN ADVANCED PO Take 2 tablets by mouth daily.   QUEtiapine 50 MG tablet Commonly known as:  SEROQUEL Take 12.5 mg by mouth at bedtime.   rosuvastatin 5 MG tablet Commonly known as:  CRESTOR Take 0.5 tab po three times weekly as directed   tamsulosin 0.4 MG Caps capsule Commonly known as:  FLOMAX Take 1 capsule (0.4 mg total) by mouth daily.   valsartan 320 MG tablet Commonly known as:  DIOVAN Take 160 mg by mouth daily.          Objective:    BP 132/72   Pulse (!) 49   Temp (!) 96.6 F (35.9 C) (Oral)   Ht 5\' 11"  (1.803 m)   Wt 175 lb 12.8 oz (79.7 kg)   BMI 24.52 kg/m   Allergies  Allergen Reactions  . Enalapril Maleate     REACTION: rash  . Penicillins     REACTION: rash, itching  . Hytrin [Terazosin] Rash    Rash when in sun  . Zestril [Lisinopril]     Wt  Readings from Last 3 Encounters:  12/31/18 175 lb 12.8 oz (79.7 kg)  12/21/18 178 lb (80.7 kg)  12/18/18 181 lb 8 oz (82.3 kg)    Physical Exam Constitutional:      Appearance: He is well-developed.  HENT:     Head: Normocephalic and atraumatic.     Right Ear:  Tympanic membrane and external ear normal. No middle ear effusion.     Left Ear: Tympanic membrane and external ear normal.  No middle ear effusion.     Nose: Mucosal edema and rhinorrhea present.     Right Sinus: No maxillary sinus tenderness.     Left Sinus: No maxillary sinus tenderness.     Mouth/Throat:     Pharynx: Uvula midline. Posterior oropharyngeal erythema present.  Eyes:     General:        Right eye: No discharge.        Left eye: No discharge.     Conjunctiva/sclera: Conjunctivae normal.     Pupils: Pupils are equal, round, and reactive to light.  Neck:     Musculoskeletal: Normal range of motion.  Cardiovascular:     Rate and Rhythm: Normal rate and regular rhythm.     Heart sounds: Normal heart sounds.  Pulmonary:     Effort: Pulmonary effort is normal. No respiratory distress.     Breath sounds: Normal breath sounds. No wheezing.  Abdominal:     Palpations: Abdomen is soft.  Lymphadenopathy:     Cervical: No cervical adenopathy.  Skin:    General: Skin is warm and dry.  Neurological:     Mental Status: He is alert and oriented to person, place, and time.         Assessment & Plan:   1. Cough - doxycycline (VIBRA-TABS) 100 MG tablet; Take 1 tablet (100 mg total) by mouth 2 (two) times daily. 1 po bid  Dispense: 20 tablet; Refill: 0 - Ambulatory referral to Gastroenterology  2. Gastroesophageal reflux disease, esophagitis presence not specified - Ambulatory referral to Gastroenterology  3. Barrett's esophagus with low grade dysplasia - Ambulatory referral to Gastroenterology  4. Benign prostatic hyperplasia with incomplete bladder emptying - tamsulosin (FLOMAX) 0.4 MG CAPS capsule; Take 1  capsule (0.4 mg total) by mouth daily.  Dispense: 90 capsule; Refill: 3   Continue all other maintenance medications as listed above.  Follow up plan: No follow-ups on file.  Educational handout given for [place] PA-C [place] [address]  [place], [place] [address] [phone]   01/03/2019, 2:20 PM

## 2019-01-07 ENCOUNTER — Ambulatory Visit (INDEPENDENT_AMBULATORY_CARE_PROVIDER_SITE_OTHER): Payer: Medicare Other

## 2019-01-07 ENCOUNTER — Ambulatory Visit (INDEPENDENT_AMBULATORY_CARE_PROVIDER_SITE_OTHER): Payer: Medicare Other | Admitting: Family Medicine

## 2019-01-07 VITALS — BP 104/61 | HR 57 | Temp 97.0°F | Ht 71.0 in | Wt 179.0 lb

## 2019-01-07 DIAGNOSIS — R058 Other specified cough: Secondary | ICD-10-CM

## 2019-01-07 DIAGNOSIS — R0989 Other specified symptoms and signs involving the circulatory and respiratory systems: Secondary | ICD-10-CM | POA: Diagnosis not present

## 2019-01-07 DIAGNOSIS — R05 Cough: Secondary | ICD-10-CM

## 2019-01-07 MED ORDER — LORATADINE 10 MG PO TABS: 10.0000 mg | ORAL_TABLET | Freq: Every day | ORAL | 11 refills | Status: DC

## 2019-01-07 MED ORDER — BENZONATATE 100 MG PO CAPS: 100.0000 mg | ORAL_CAPSULE | Freq: Three times a day (TID) | ORAL | 0 refills | Status: DC | PRN

## 2019-01-07 NOTE — Progress Notes (Signed)
Subjective: CC: Persistent cough PCP: Chipper Herb, MD [id number] Joseph Larsen is a 73 y.o. male presenting to clinic today for:  1.  Persistent cough Patient has had persistent cough since 1 January.  He was seen 3 times now in office and treated with 3 separate antibiotics and a prednisone Dosepak.  He notes no improvement in symptoms.  He has not actually been taking a cough suppressant, as he was unaware that he had options given chronic anticoagulation.  He denies any hemoptysis, associated shortness of breath or wheeze.  He does report rhinorrhea and postnasal drip.  No fevers, chills.  No lower extremity edema.  He feels like his acid reflux symptoms are under excellent control with omeprazole 40 mg twice daily.  No GI symptoms including hematochezia or melena.  He has Nasalide at home but has not been using this.  He is not on any other oral antihistamine.  He is a former smoker.   ROS: Per HPI  Allergies  Allergen Reactions  . Enalapril Maleate     REACTION: rash  . Penicillins     REACTION: rash, itching  . Hytrin [Terazosin] Rash    Rash when in sun  . Zestril [Lisinopril]    Past Medical History:  Diagnosis Date  . Abnormal ECG    a. 08/2012 Abnl ETT with ST changes in recovery;  b. 08/2012 Ex MV, ex time 10:00, EF 66%, no ischemia/infarct.  . Allergic rhinitis   . Anxiety   . Cataract   . Depression   . Diverticular disease   . Dyspnea on exertion   . ETOH abuse   . GERD (gastroesophageal reflux disease)   . Gout   . Hemorrhoids   . Hiatal hernia   . HTN (hypertension)   . Low testosterone   . MACROCYTIC ANEMIA   . NONSPEC ELEVATION OF LEVELS OF TRANSAMINASE/LDH   . Nonspecific abnormal finding in stool contents   . Palpitations   . PTSD (post-traumatic stress disorder)   . PTSD (post-traumatic stress disorder)    [name]    Current Outpatient Medications:  .  allopurinol (ZYLOPRIM) 300 MG tablet, Take 150 mg by mouth daily. , Disp: , Rfl:  .   amLODipine (NORVASC) 10 MG tablet, Take 5 mg by mouth daily., Disp: , Rfl:  .  apixaban (ELIQUIS) 5 MG TABS tablet, Take 1 tablet (5 mg total) by mouth 2 (two) times daily., Disp: 60 tablet, Rfl: 0 .  atenolol (TENORMIN) 25 MG tablet, Take 1 tablet (25 mg total) by mouth 2 (two) times daily., Disp: 60 tablet, Rfl: 3 .  buPROPion (WELLBUTRIN SR) 200 MG 12 hr tablet, Take 200 mg by mouth daily. , Disp: , Rfl:  .  cholecalciferol (VITAMIN D) 1000 UNITS tablet, Take 1,000 Units by mouth daily., Disp: , Rfl:  .  cyanocobalamin (,VITAMIN B-12,) 1000 MCG/ML injection, Inject 1 mL (1,000 mcg total) into the muscle every 30 (thirty) days., Disp: 3 mL, Rfl: 0 .  diazepam (VALIUM) 10 MG tablet, Take 1 tablet (10 mg total) by mouth every 8 (eight) hours as needed for anxiety. (Patient taking differently: Take 10 mg by mouth every 12 (twelve) hours as needed for anxiety. ), Disp: 90 tablet, Rfl: 1 .  finasteride (PROSCAR) 5 MG tablet, Take 1 tablet (5 mg total) by mouth daily., Disp: 90 tablet, Rfl: 3 .  flunisolide (NASALIDE) 0.025 % SOLN, Place 2 sprays into the nose 2 (two) times daily as needed (for  allergies). , Disp: , Rfl:  .  hydrochlorothiazide (HYDRODIURIL) 25 MG tablet, Take 25 mg by mouth daily. , Disp: , Rfl:  .  Misc Natural Products (OSTEO BI-FLEX/5-LOXIN ADVANCED PO), Take 2 tablets by mouth daily. , Disp: , Rfl:  .  Omega-3 Fatty Acids (FISH OIL) 1000 MG CAPS, Take 4 capsules by mouth daily. , Disp: , Rfl:  .  omeprazole (PRILOSEC) 20 MG capsule, Take 40 mg by mouth 2 (two) times daily before a meal. , Disp: , Rfl:  .  QUEtiapine (SEROQUEL) 50 MG tablet, Take 12.5 mg by mouth at bedtime. , Disp: , Rfl:  .  rosuvastatin (CRESTOR) 5 MG tablet, Take 0.5 tab po three times weekly as directed (Patient taking differently: Take 2.5 mg by mouth every Monday, Wednesday, and Friday. ), Disp: 20 tablet, Rfl: 3 .  tamsulosin (FLOMAX) 0.4 MG CAPS capsule, Take 1 capsule (0.4 mg total) by mouth daily., Disp: 90  capsule, Rfl: 3 .  valsartan (DIOVAN) 320 MG tablet, Take 160 mg by mouth daily., Disp: , Rfl:  .  albuterol (PROVENTIL HFA;VENTOLIN HFA) 108 (90 Base) MCG/ACT inhaler, Inhale 2 puffs into the lungs every 6 (six) hours as needed for wheezing or shortness of breath. (Patient not taking: Reported on 01/07/2019), Disp: 1 Inhaler, Rfl: 0 11  Current Facility-Administered Medications:  .  cyanocobalamin ((VITAMIN B-12)) injection 1,000 mcg, 1,000 mcg, Intramuscular, Q30 days, Timmothy Euler, MD, 1,000 mcg at 02/13/18 0932 .  cyanocobalamin ((VITAMIN B-12)) injection 1,000 mcg, 1,000 mcg, Intramuscular, Q30 days, Chipper Herb, MD, 1,000 mcg at 12/31/18 1123 Social History   Socioeconomic History  . Marital status: Married    Spouse name: Suanne Marker  . Number of children: 1  . Years of education: Not on file  . Highest education level: Not on file  Occupational History  . Occupation: disabled    Employer: RETIRED    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  Social Needs  . Financial resource strain: Not hard at all  . Food insecurity:    Worry: Never true    Inability: Never true  . Transportation needs:    Medical: Not on file    Non-medical: No  Tobacco Use  . Smoking status: Former Smoker    Packs/day: 3.00    Types: Cigarettes    Start date: 02/09/1965    Last attempt to quit: 05/12/1982    Years since quitting: 36.6  . Smokeless tobacco: Never Used  Substance and Sexual Activity  . Alcohol use: Yes    Alcohol/week: 14.0 standard drinks    Types: 14 Cans of beer per week    Comment: drinks 2-3 cans of beer/day    . Drug use: No    Comment: 1 cup of decaf coffee/day.  Marland Kitchen Sexual activity: Yes  Lifestyle  . Physical activity:    Days per week: 3 days    Minutes per session: 60 min  . Stress: Not at all  Relationships  . Social connections:    Talks on phone: More than three times a week    Gets together: More than three times a week    Attends religious service:  Never    Active member of club or organization: No    Attends meetings of clubs or organizations: Never    Relationship status: Married  . Intimate partner violence:    Fear of current or ex partner: No    Emotionally abused: No    Physically abused: No  Forced sexual activity: No  Other Topics Concern  . Not on file  Social History Narrative   Lives with wife in [place] in a one story home.  Exercises @ gym 3x/wk - stationary bike and light weight training.   Family History  Problem Relation Age of Onset  . Heart disease Mother        "died of chf"  . Heart disease Father        "died of old age"  . Hypertension Sister   . Liver disease Brother   . Hypertension Brother   . Hypertension Sister   . Hypertension Sister   . Hypertension Sister   . Heart murmur Sister   . Colon cancer Neg Hx     Objective: Office vital signs reviewed. BP 104/61   Pulse (!) 57   Temp (!) 97 F (36.1 C) (Other (Comment))   Ht 5\' 11"  (1.803 m)   Wt 179 lb (81.2 kg)   SpO2 99%   BMI 24.97 kg/m   Physical Examination:  General: Awake, alert, well nourished, No acute distress HEENT: Normal    Neck: No masses palpated. No lymphadenopathy    Ears: Tympanic membranes intact, normal light reflex, no erythema, no bulging    Eyes: extraocular membranes intact, sclera white    Nose: nasal turbinates moist, clear nasal discharge    Throat: moist mucus membranes, mild cobblestoning of the oropharynx, no tonsillar exudate.  Airway is patent Cardio: regular rate and rhythm, S1S2 heard, no murmurs appreciated Pulm: clear to auscultation bilaterally, no wheezes, rhonchi or rales; normal work of breathing on room air Extremities: warm, well perfused, No edema, cyanosis or clubbing; +2 pulses bilaterally  Dg Chest 2 View  Result Date: 01/07/2019 CLINICAL DATA:  Congestion for 3-4 weeks EXAM: CHEST - 2 VIEW COMPARISON:  08/03/2018 FINDINGS: Normal heart size, mediastinal contours, and pulmonary  vascularity. Lungs clear. No infiltrate, pleural effusion or pneumothorax. Old healed fractures of the posterolateral RIGHT fifth and sixth ribs. IMPRESSION: No acute abnormalities. Electronically Signed   By: Lavonia Dana M.D.   On: 01/07/2019 13:35   Assessment/ Plan: 72 y.o. male   1. Cough present for greater than 3 weeks Patient is afebrile and nontoxic-appearing.  His physical exam was notable for mild cobblestoning of the oropharynx and intermittent coughing during exam.  There is certainly nothing on exam to suggest ongoing infection.  I do question cough secondary to postnasal drip/allergy mediated versus residual cough from infection.  We discussed home care instructions including use of oral antihistamine.  Claritin was prescribed today.  He is to resume use of his nasal spray.  I have also prescribed him Tessalon Perles to see if we can break the cough cycle.  Chest x-ray was obtained to further evaluate given chronicity of cough despite lack of pulmonary symptoms.  Personal review of the chest x-ray demonstrated no acute pulmonary infiltrates or acute findings to explain persistent cough.  I reviewed those results with the patient.  Radiologist confirmed this.  If symptoms persist despite adequate control of allergy symptoms and acid reflux, plan for referral to pulmonology.  This was also discussed with patient he is amenable to this. - DG Chest 2 View; Future   Orders Placed This Encounter  Procedures  . DG Chest 2 View    Standing Status:   Future    Number of Occurrences:   1    Standing Expiration Date:   03/07/2020    Order Specific Question:  Reason for Exam (SYMPTOM  OR DIAGNOSIS REQUIRED)    Answer:   cough>3 weeks    Order Specific Question:   Preferred imaging location?    Answer:   Internal    Order Specific Question:   Radiology Contrast Protocol - do NOT remove file path    Answer:   [email or web address]   Meds ordered this  encounter  Medications  . benzonatate (TESSALON PERLES) 100 MG capsule    Sig: Take 1 capsule (100 mg total) by mouth 3 (three) times daily as needed.    Dispense:  20 capsule    Refill:  0  . loratadine (CLARITIN) 10 MG tablet    Sig: Take 1 tablet (10 mg total) by mouth daily.    Dispense:  30 tablet    Refill:  [place], [place] [phone]

## 2019-01-07 NOTE — Patient Instructions (Signed)
I have sent you in a cough suppressant called benzonatate that you may take up to 3 times daily if you would like.  This does not cause sedation so it is safe to drive while on that medicine.  I have also sent you in Claritin.  I want you taking 1 tablet every day.  This should dry up some of the drainage you have been experiencing.  There is certainly no evidence of ongoing active bacterial infection on exam.  No need for additional antibiotics at this time.  We discussed that in many cases cough can linger despite resolution of active infection.  We will try and break up the cough with the benzonatate as above.  Resume use of your nasal spray.  We got a chest x-ray today as well to evaluate for any abnormalities within the chest that may be contributing to the cough.  Continue your acid reflux medicine as prescribed.  I would recommend that if your symptoms do not improve or significantly worsen we should plan to see pulmonology.  Please contact me if you have no improvement or any worsening of symptoms and I will place the referral.   Cough, Adult  A cough helps to clear your throat and lungs. A cough may last only 2-3 weeks (acute), or it may last longer than 8 weeks (chronic). Many different things can cause a cough. A cough may be a sign of an illness or another medical condition. Follow these instructions at home:  Pay attention to any changes in your cough.  Take medicines only as told by your doctor. ? If you were prescribed an antibiotic medicine, take it as told by your doctor. Do not stop taking it even if you start to feel better. ? Talk with your doctor before you try using a cough medicine.  Drink enough fluid to keep your pee (urine) clear or pale yellow.  If the air is dry, use a cold steam vaporizer or humidifier in your home.  Stay away from things that make you cough at work or at home.  If your cough is worse at night, try using extra pillows to raise your head up  higher while you sleep.  Do not smoke, and try not to be around smoke. If you need help quitting, ask your doctor.  Do not have caffeine.  Do not drink alcohol.  Rest as needed. Contact a doctor if:  You have new problems (symptoms).  You cough up yellow fluid (pus).  Your cough does not get better after 2-3 weeks, or your cough gets worse.  Medicine does not help your cough and you are not sleeping well.  You have pain that gets worse or pain that is not helped with medicine.  You have a fever.  You are losing weight and you do not know why.  You have night sweats. Get help right away if:  You cough up blood.  You have trouble breathing.  Your heartbeat is very fast. This information is not intended to replace advice given to you by your health care provider. Make sure you discuss any questions you have with your health care provider. Document Released: 08/11/2011 Document Revised: 05/05/2016 Document Reviewed: 02/04/2015 Elsevier Interactive Patient Education  2019 [employer].

## 2019-01-17 DIAGNOSIS — N401 Enlarged prostate with lower urinary tract symptoms: Secondary | ICD-10-CM | POA: Diagnosis not present

## 2019-01-17 DIAGNOSIS — R351 Nocturia: Secondary | ICD-10-CM | POA: Diagnosis not present

## 2019-01-17 DIAGNOSIS — R3912 Poor urinary stream: Secondary | ICD-10-CM | POA: Diagnosis not present

## 2019-01-31 DIAGNOSIS — M25511 Pain in right shoulder: Secondary | ICD-10-CM | POA: Diagnosis not present

## 2019-03-04 ENCOUNTER — Ambulatory Visit: Payer: Medicare Other | Admitting: Family Medicine

## 2019-04-16 ENCOUNTER — Encounter: Payer: Self-pay | Admitting: Family Medicine

## 2019-04-16 ENCOUNTER — Other Ambulatory Visit: Payer: Self-pay

## 2019-04-16 ENCOUNTER — Ambulatory Visit (INDEPENDENT_AMBULATORY_CARE_PROVIDER_SITE_OTHER): Payer: Medicare Other | Admitting: Family Medicine

## 2019-04-16 DIAGNOSIS — E790 Hyperuricemia without signs of inflammatory arthritis and tophaceous disease: Secondary | ICD-10-CM | POA: Diagnosis not present

## 2019-04-16 DIAGNOSIS — I700 Atherosclerosis of aorta: Secondary | ICD-10-CM

## 2019-04-16 DIAGNOSIS — I4891 Unspecified atrial fibrillation: Secondary | ICD-10-CM

## 2019-04-16 DIAGNOSIS — K22710 Barrett's esophagus with low grade dysplasia: Secondary | ICD-10-CM

## 2019-04-16 DIAGNOSIS — K219 Gastro-esophageal reflux disease without esophagitis: Secondary | ICD-10-CM

## 2019-04-16 DIAGNOSIS — E538 Deficiency of other specified B group vitamins: Secondary | ICD-10-CM | POA: Diagnosis not present

## 2019-04-16 DIAGNOSIS — M109 Gout, unspecified: Secondary | ICD-10-CM

## 2019-04-16 NOTE — Patient Instructions (Signed)
[facility] Continue to follow-up with cardiology yearly Continue to follow-up with gastroenterology for Barrett's esophagus Remember that Dr. Fuller Plan will need to do your colonoscopy this fall Drink plenty of fluids and stay well-hydrated Exercise regularly Practice good hand and respiratory hygiene

## 2019-04-16 NOTE — Addendum Note (Signed)
Addended by: Zannie Cove on: 04/16/2019 12:07 PM   Modules accepted: Orders

## 2019-04-16 NOTE — Progress Notes (Signed)
Virtual Visit Via telephone Note I connected with@ on 04/16/19 by telephone and verified that I am speaking with the correct person or authorized healthcare agent using two identifiers. Joseph Larsen is currently located at home and there are no unauthorized people in close proximity. I completed this visit while in a private location in my home .  I connected to the patient by telephone and confirmed that I was speaking with the correct person.  This visit type was conducted due to national recommendations for restrictions regarding the COVID-19 Pandemic (e.g. social distancing).  This format is felt to be most appropriate for this patient at this time.  All issues noted in this document were discussed and addressed.  No physical exam was performed.    I discussed the limitations, risks, security and privacy concerns of performing an evaluation and management service by telephone and the availability of in person appointments. I also discussed with the patient that there may be a patient responsible charge related to this service. The patient expressed understanding and agreed to proceed.   Date:  04/16/2019    ID:  Joseph Larsen      [birth year]        [id number]   Patient Care Team Patient Care Team: Chipper Herb, MD as PCP - General (Family Medicine) Josue Hector, MD as PCP - Cardiology (Cardiology) Ladene Artist, MD as Consulting Physician (Gastroenterology) Adria Devon Rayne Du, MD as Consulting Physician (Gastroenterology)  Reason for Visit: Primary Care Follow-up     History of Present Illness & Review of Systems:     Joseph Larsen is a 73 y.o. year old male primary care patient that presents today for a telehealth visit.  Patient is doing well overall.  He denies any chest pain pressure or tightness or palpitations.  He denies any shortness of breath.  He denies any trouble with his stomach including heartburn indigestion nausea vomiting diarrhea blood in the stool or  black tarry bowel movements.  He is passing his water well with taking Proscar and Flomax.  He says that the Proscar seems to affect his sexual function he is going to try to reduce that some and reduce it gradually and stay on the Flomax and see if he can get off of that.  I thought that was a good plan.  He is staying busy outside has a large yard and enjoys working in his yard and taking care of his property.  He seemed to be upbeat and positive.  He gets B12 injections and he has a daughter that gives him his B12 injections monthly.  He also goes to the [place] and gets most of his prescriptions filled there.  He is due to get a colonoscopy this fall and hopefully Dr. Fuller Plan will call him when that is due.  If he does not the patient understands to call us and we will call Dr. start with a reminder.  He also [facility] and has a visit there once yearly in the fall.  Review of systems as stated otherwise negative for body systems left unmentioned.   The patient does not have symptoms concerning for COVID-19 infection (fever, chills, cough, or new shortness of breath).      Current Medications (Verified) Allergies as of 04/16/2019      Reactions   Enalapril Maleate    REACTION: rash   Penicillins    REACTION: rash, itching  Hytrin [terazosin] Rash   Rash when in sun   Zestril [lisinopril]       Medication List       Accurate as of Apr 16, 2019  9:46 AM. Always use your most recent med list.        albuterol 108 (90 Base) MCG/ACT inhaler Commonly known as:  VENTOLIN HFA Inhale 2 puffs into the lungs every 6 (six) hours as needed for wheezing or shortness of breath.   allopurinol 300 MG tablet Commonly known as:  ZYLOPRIM Take 150 mg by mouth daily.   amLODipine 10 MG tablet Commonly known as:  NORVASC Take 5 mg by mouth daily.   apixaban 5 MG Tabs tablet Commonly known as:  ELIQUIS Take 1 tablet (5 mg total) by mouth 2 (two) times daily.   atenolol  25 MG tablet Commonly known as:  TENORMIN Take 1 tablet (25 mg total) by mouth 2 (two) times daily.   benzonatate 100 MG capsule Commonly known as:  Tessalon Perles Take 1 capsule (100 mg total) by mouth 3 (three) times daily as needed.   buPROPion 200 MG 12 hr tablet Commonly known as:  WELLBUTRIN SR Take 200 mg by mouth daily.   cholecalciferol 1000 units tablet Commonly known as:  VITAMIN D Take 1,000 Units by mouth daily.   cyanocobalamin 1000 MCG/ML injection Commonly known as:  (VITAMIN B-12) Inject 1 mL (1,000 mcg total) into the muscle every 30 (thirty) days.   diazepam 10 MG tablet Commonly known as:  VALIUM Take 1 tablet (10 mg total) by mouth every 8 (eight) hours as needed for anxiety.   finasteride 5 MG tablet Commonly known as:  PROSCAR Take 1 tablet (5 mg total) by mouth daily.   Fish Oil 1000 MG Caps Take 4 capsules by mouth daily.   flunisolide 25 MCG/ACT (0.025%) Soln Commonly known as:  NASALIDE Place 2 sprays into the nose 2 (two) times daily as needed (for allergies).   hydrochlorothiazide 25 MG tablet Commonly known as:  HYDRODIURIL Take 25 mg by mouth daily.   loratadine 10 MG tablet Commonly known as:  CLARITIN Take 1 tablet (10 mg total) by mouth daily.   omeprazole 20 MG capsule Commonly known as:  PRILOSEC Take 40 mg by mouth 2 (two) times daily before a meal.   OSTEO BI-FLEX/5-LOXIN ADVANCED PO Take 2 tablets by mouth daily.   QUEtiapine 50 MG tablet Commonly known as:  SEROQUEL Take 12.5 mg by mouth at bedtime.   rosuvastatin 5 MG tablet Commonly known as:  Crestor Take 0.5 tab po three times weekly as directed   tamsulosin 0.4 MG Caps capsule Commonly known as:  FLOMAX Take 1 capsule (0.4 mg total) by mouth daily.   valsartan 320 MG tablet Commonly known as:  DIOVAN Take 160 mg by mouth daily.           Allergies (Verified)    Enalapril maleate; Penicillins; Hytrin [terazosin]; and Zestril [lisinopril]  Past  Medical History Past Medical History:  Diagnosis Date  . Abnormal ECG    a. 08/2012 Abnl ETT with ST changes in recovery;  b. 08/2012 Ex MV, ex time 10:00, EF 66%, no ischemia/infarct.  . Allergic rhinitis   . Anxiety   . Cataract   . Depression   . Diverticular disease   . Dyspnea on exertion   . ETOH abuse   . GERD (gastroesophageal reflux disease)   . Gout   . Hemorrhoids   . Hiatal hernia   .  HTN (hypertension)   . Low testosterone   . MACROCYTIC ANEMIA   . NONSPEC ELEVATION OF LEVELS OF TRANSAMINASE/LDH   . Nonspecific abnormal finding in stool contents   . Palpitations   . PTSD (post-traumatic stress disorder)   . PTSD (post-traumatic stress disorder)    [name] - [employer]     Past Surgical History:  Procedure Laterality Date  . CHOLECYSTECTOMY  2004  . KNEE SURGERY Bilateral     Social History   Socioeconomic History  . Marital status: Married    Spouse name: Joseph Larsen  . Number of children: 1  . Years of education: Not on file  . Highest education level: Not on file  Occupational History  . Occupation: disabled    Employer: RETIRED    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  Social Needs  . Financial resource strain: Not hard at all  . Food insecurity:    Worry: Never true    Inability: Never true  . Transportation needs:    Medical: Not on file    Non-medical: No  Tobacco Use  . Smoking status: Former Smoker    Packs/day: 3.00    Types: Cigarettes    Start date: 02/09/1965    Last attempt to quit: 05/12/1982    Years since quitting: 36.9  . Smokeless tobacco: Never Used  Substance and Sexual Activity  . Alcohol use: Yes    Alcohol/week: 14.0 standard drinks    Types: 14 Cans of beer per week    Comment: drinks 2-3 cans of beer/day    . Drug use: No    Comment: 1 cup of decaf coffee/day.  Marland Kitchen Sexual activity: Yes  Lifestyle  . Physical activity:    Days per week: 3 days    Minutes per session: 60 min  . Stress: Not at all  Relationships   . Social connections:    Talks on phone: More than three times a week    Gets together: More than three times a week    Attends religious service: Never    Active member of club or organization: No    Attends meetings of clubs or organizations: Never    Relationship status: Married  Other Topics Concern  . Not on file  Social History Narrative   Lives with wife in [place] in a one story home.  Exercises @ gym 3x/wk - stationary bike and light weight training.     Family History  Problem Relation Age of Onset  . Heart disease Mother        "died of chf"  . Heart disease Father        "died of old age"  . Hypertension Sister   . Liver disease Brother   . Hypertension Brother   . Hypertension Sister   . Hypertension Sister   . Hypertension Sister   . Heart murmur Sister   . Colon cancer Neg Hx       Labs/Other Tests and Data Reviewed:    Wt Readings from Last 3 Encounters:  01/07/19 179 lb (81.2 kg)  12/31/18 175 lb 12.8 oz (79.7 kg)  12/21/18 178 lb (80.7 kg)   Temp Readings from Last 3 Encounters:  01/07/19 (!) 97 F (36.1 C) (Other (Comment))  12/31/18 (!) 96.6 F (35.9 C) (Oral)  12/21/18 99.2 F (37.3 C) (Oral)   BP Readings from Last 3 Encounters:  01/07/19 104/61  12/31/18 132/72  12/21/18 110/67   Pulse Readings from Last 3 Encounters:  01/07/19 Marland Kitchen)  57  12/31/18 (!) 49  12/21/18 72     No results found for: HGBA1C Lab Results  Component Value Date   LDLCALC 48 08/31/2018   CREATININE 0.79 08/31/2018       Chemistry      Component Value Date/Time   NA 141 08/31/2018 1021   K 3.8 08/31/2018 1021   CL 98 08/31/2018 1021   CO2 27 08/31/2018 1021   BUN 9 08/31/2018 1021   CREATININE 0.79 08/31/2018 1021   CREATININE 0.70 03/05/2013 0842      Component Value Date/Time   CALCIUM 9.4 08/31/2018 1021   ALKPHOS 48 08/31/2018 1021   AST 25 08/31/2018 1021   ALT 28 08/31/2018 1021   BILITOT 0.6 08/31/2018 1021         OBSERVATIONS/  OBJECTIVE:     Patient is alert and positive in demeanor.  His weight at home is running around 175.  In the past it was much higher.  His blood pressures are running in the 1 teens and as low as 009 systolic over 38-18.  He is due to get lab work and will come by the office in 3 to 4 weeks to get this done as we have an order placed for that.  He has had no trouble with his joints or with gout that he can recall.  Physical exam deferred due to nature of telephonic visit.  ASSESSMENT & PLAN    Time:   Today, I have spent 28 minutes with the patient via telephone discussing the above including Covid precautions.     Visit Diagnoses: 1. Gastroesophageal reflux disease, esophagitis presence not specified -The patient does have reflux disease.  He has been taking Nexium rarely.  He does have a history of Barrett's esophagus.  I told her that he should take at least something like Pepcid once or twice daily if needed for any symptoms.  2. Barrett's esophagus with low grade dysplasia -Continue to follow-up with gastroenterologist regularly for Barrett's.  Was informed to take Pepcid [facility] on a regular basis until he sees the gastroenterologist in follow-up.  3. Atrial fibrillation, unspecified type ([place]) -Follow up with cardiology regularly and stay on Eliquis  4. Aortic atherosclerosis (HCC) -Continue with statin therapy and his aggressive therapeutic lifestyle changes as possible to keep cholesterol under control and heart disease risk lower  5. B12 deficiency -Continue with B12 injections  6. Elevated uric acid in blood -Check uric acid next visit for lab work  7. Gout, unspecified cause, unspecified chronicity, unspecified site -No complaints today with gout.  Patient Instructions  [facility] Continue to follow-up with cardiology yearly Continue to follow-up with gastroenterology for Barrett's esophagus Remember that Dr. Fuller Plan will need to do  your colonoscopy this fall Drink plenty of fluids and stay well-hydrated Exercise regularly Practice good hand and respiratory hygiene     The above assessment and management plan was discussed with the patient. The patient verbalized understanding of and has agreed to the management plan. Patient is aware to call the clinic if symptoms persist or worsen. Patient is aware when to return to the clinic for a follow-up visit. Patient educated on when it is appropriate to go to the emergency department.    Chipper Herb, MD [address], [place], [place] [address] Ph [phone]   Arrie Senate MD

## 2019-05-23 ENCOUNTER — Telehealth: Payer: Self-pay | Admitting: Family Medicine

## 2019-05-23 NOTE — Telephone Encounter (Signed)
Do 50 mg of losartan and have patient to bring blood pressure readings by in a couple of weeks and have nurse check blood pressure in office.

## 2019-05-23 NOTE — Telephone Encounter (Signed)
Currently on DIOVAN 160 - once a day   What would the equivalent Losartan be?

## 2019-05-23 NOTE — Telephone Encounter (Signed)
Pt aware.

## 2019-05-28 DIAGNOSIS — M25511 Pain in right shoulder: Secondary | ICD-10-CM | POA: Diagnosis not present

## 2019-06-06 IMAGING — DX DG CHEST 2V
2 series · 2 of 2 positions shown · non-contrast
Comparison: 08/03/2018

CLINICAL DATA: Congestion for 3-4 weeks

EXAM:
CHEST - 2 VIEW

[chest pa]
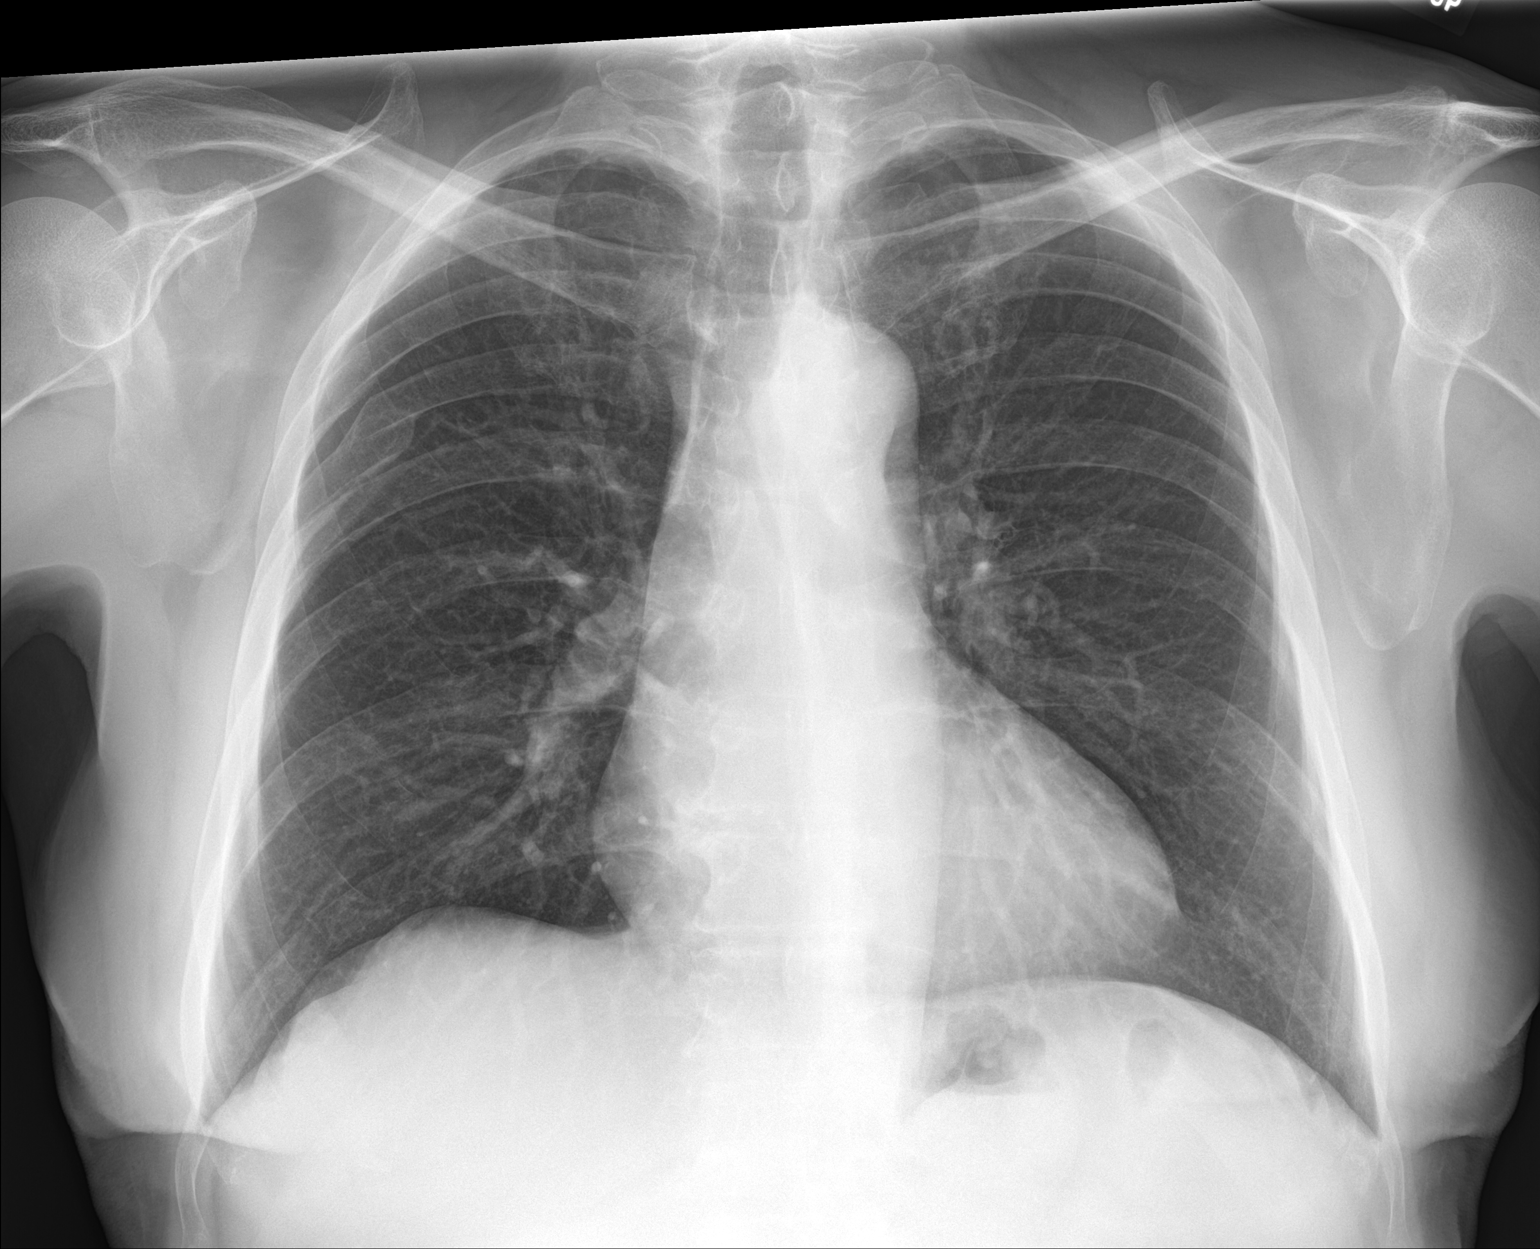

[chest lat]
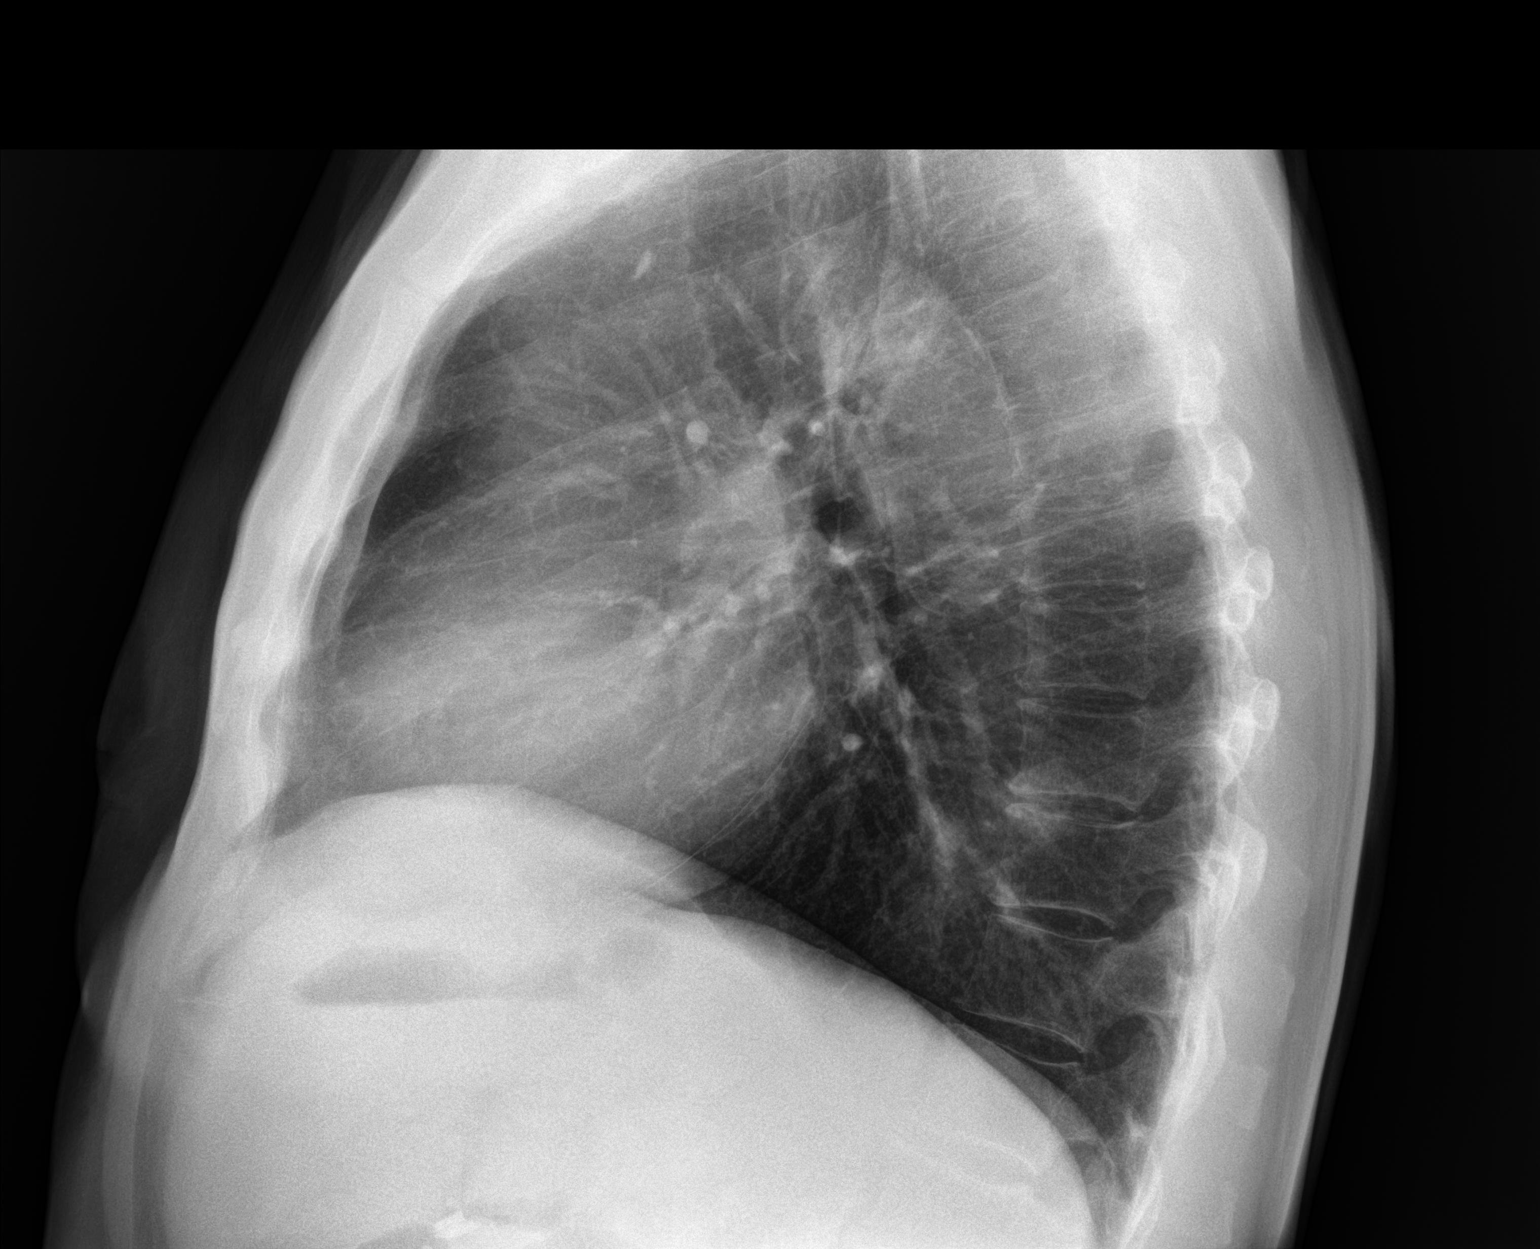

[2 of 2 positions shown; findings below may reference images not displayed]

FINDINGS: Normal heart size, mediastinal contours, and pulmonary vascularity.

Lungs clear.

No infiltrate, pleural effusion or pneumothorax.

Old healed fractures of the posterolateral RIGHT fifth and sixth
ribs.
IMPRESSION: No acute abnormalities.

## 2019-06-17 DIAGNOSIS — Z85828 Personal history of other malignant neoplasm of skin: Secondary | ICD-10-CM | POA: Diagnosis not present

## 2019-06-17 DIAGNOSIS — C44612 Basal cell carcinoma of skin of right upper limb, including shoulder: Secondary | ICD-10-CM | POA: Diagnosis not present

## 2019-06-17 DIAGNOSIS — L570 Actinic keratosis: Secondary | ICD-10-CM | POA: Diagnosis not present

## 2019-06-17 DIAGNOSIS — D1801 Hemangioma of skin and subcutaneous tissue: Secondary | ICD-10-CM | POA: Diagnosis not present

## 2019-06-17 DIAGNOSIS — L821 Other seborrheic keratosis: Secondary | ICD-10-CM | POA: Diagnosis not present

## 2019-06-17 DIAGNOSIS — D2372 Other benign neoplasm of skin of left lower limb, including hip: Secondary | ICD-10-CM | POA: Diagnosis not present

## 2019-06-17 DIAGNOSIS — L814 Other melanin hyperpigmentation: Secondary | ICD-10-CM | POA: Diagnosis not present

## 2019-06-17 DIAGNOSIS — D225 Melanocytic nevi of trunk: Secondary | ICD-10-CM | POA: Diagnosis not present

## 2019-06-19 DIAGNOSIS — M25511 Pain in right shoulder: Secondary | ICD-10-CM | POA: Diagnosis not present

## 2019-06-20 DIAGNOSIS — M75101 Unspecified rotator cuff tear or rupture of right shoulder, not specified as traumatic: Secondary | ICD-10-CM | POA: Insufficient documentation

## 2019-06-24 DIAGNOSIS — M75101 Unspecified rotator cuff tear or rupture of right shoulder, not specified as traumatic: Secondary | ICD-10-CM | POA: Diagnosis not present

## 2019-09-26 NOTE — Progress Notes (Signed)
Cardiology Office Note   Date:  09/27/2019   ID:  Joseph Larsen, DOB 09/29/1946, MRN [id number]  PCP:  Chipper Herb, MD (Inactive)  Cardiologist:   Jenkins Rouge, MD   No chief complaint on file.     History of Present Illness: Joseph Larsen is a 73 y.o. male who presents for f//u regarding chest pain and PAF  Previous smoker with HTN.  History of ETOH abuse beer and peach brandy History of abnormal ECG with normal myovue in 2013. Previous palpitations with holder 2015 showing only PAC;s. 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   Holter with PAF started on atenolol and eliquis Myovue normal no ischemia Echo:  05/15/18  EF 60-65% no sig valve dx LA only 36 mm  Needs clearance for right shoulder surgery with Dr Linden Dolin. Will need to hold Eliquis 48 hours before    Past Medical History:  Diagnosis Date  . Abnormal ECG    a. 08/2012 Abnl ETT with ST changes in recovery;  b. 08/2012 Ex MV, ex time 10:00, EF 66%, no ischemia/infarct.  . Allergic rhinitis   . Anxiety   . Cataract   . Depression   . Diverticular disease   . Dyspnea on exertion   . ETOH abuse   . GERD (gastroesophageal reflux disease)   . Gout   . Hemorrhoids   . Hiatal hernia   . HTN (hypertension)   . Low testosterone   . MACROCYTIC ANEMIA   . NONSPEC ELEVATION OF LEVELS OF TRANSAMINASE/LDH   . Nonspecific abnormal finding in stool contents   . Palpitations   . PTSD (post-traumatic stress disorder)   . PTSD (post-traumatic stress disorder)    [name] - [employer]    Past Surgical History:  Procedure Laterality Date  . CHOLECYSTECTOMY  2004  . KNEE SURGERY Bilateral       Current Outpatient Medications  Medication Sig Dispense Refill  . amLODipine (NORVASC) 10 MG tablet Take 5 mg by mouth daily.    Marland Kitchen apixaban (ELIQUIS) 5 MG TABS tablet Take 1 tablet (5 mg total) by mouth 2 (two) times daily. 60 tablet 0  . atenolol (TENORMIN) 25 MG tablet Take 1 tablet (25 mg total) by mouth 2 (two) times daily. 60 tablet 3  . cholecalciferol (VITAMIN D) 1000 UNITS tablet Take 1,000 Units by mouth daily.    . diazepam (VALIUM) 10 MG tablet Take 1 tablet (10 mg total) by mouth every 8 (eight) hours as needed for anxiety. (Patient taking differently: Take 10 mg by mouth every 12 (twelve) hours as needed for anxiety. ) 90 tablet 1  . hydrochlorothiazide (HYDRODIURIL) 25 MG tablet Take 25 mg by mouth daily.     Marland Kitchen losartan (COZAAR) 100 MG tablet Take 50 mg by mouth daily.    . Misc Natural Products (OSTEO BI-FLEX/5-LOXIN ADVANCED PO) Take 2 tablets by mouth daily.     . Omega-3 Fatty Acids (FISH OIL) 1000 MG CAPS Take 4 capsules by mouth daily.     Marland Kitchen omeprazole (PRILOSEC) 20 MG capsule Take 40 mg by mouth 2 (two) times daily  before a meal.     . QUEtiapine (SEROQUEL) 50 MG tablet Take 12.5 mg by mouth at bedtime.     . rosuvastatin (CRESTOR) 5 MG tablet Take 0.5 tab po three times weekly as directed (Patient taking differently: Take 2.5 mg by mouth every Monday, Wednesday, and Friday. ) 20 tablet 3  . tamsulosin (FLOMAX) 0.4 MG CAPS capsule Take 1 capsule (0.4 mg total) by mouth daily. 90 capsule 3   Current Facility-Administered Medications  Medication Dose Route Frequency Provider Last Rate Last Dose  . cyanocobalamin ((VITAMIN B-12)) injection 1,000 mcg  1,000 mcg Intramuscular Q30 days Timmothy Euler, MD   1,000 mcg at 02/13/18 [id number]    Allergies:   Terazosin, Enalapril maleate, Enalapril maleate, Lisinopril, and Penicillins    Social History:  The patient  reports that he quit smoking about 37 years ago. His smoking use included cigarettes. He started smoking about  54 years ago. He smoked 3.00 packs per day. He has never used smokeless tobacco. He reports current alcohol use of about 14.0 standard drinks of alcohol per week. He reports that he does not use drugs.   Family History:  The patient's family history includes Heart disease in his father and mother; Heart murmur in his sister; Hypertension in his brother, sister, sister, sister, and sister; Liver disease in his brother.    ROS:  Please see the history of present illness.   Otherwise, review of systems are positive for none.   All other systems are reviewed and negative.    PHYSICAL EXAM: VS:  BP 130/72   Pulse (!) 53   Ht 5\' 11"  (1.803 m)   Wt 195 lb (88.5 kg)   SpO2 98%   BMI 27.20 kg/m  , BMI Body mass index is 27.2 kg/m. Affect appropriate Healthy:  appears stated age 23: normal Neck supple with no adenopathy JVP normal no bruits no thyromegaly Lungs clear with no wheezing and good diaphragmatic motion Heart:  S1/S2 no murmur, no rub, gallop or click PMI normal Abdomen: benighn, BS positve, no tenderness, no AAA no bruit.  No HSM or HJR Distal pulses intact with no bruits No edema Neuro non-focal Skin warm and dry No muscular weakness    EKG:  SR rate 56 normal 04/26/18 09/27/19 SR rate 53 LVH    Recent Labs: No results found for requested labs within last 8760 hours.    Lipid Panel    Component Value Date/Time   CHOL 88 (L) 08/31/2018 1021   TRIG 36 08/31/2018 1021   TRIG 82 03/25/2015 0950   HDL 33 (L) 08/31/2018 1021   HDL 38 (L) 03/25/2015 0950   CHOLHDL 2.7 08/31/2018 1021   LDLCALC 48 08/31/2018 1021   LDLCALC 54 07/17/2014 0828   LDLDIRECT 74 01/03/2017 1100      Wt Readings from Last 3 Encounters:  09/27/19 195 lb (88.5 kg)  01/07/19 179 lb (81.2 kg)  12/31/18 175 lb 12.8 oz (79.7 kg)      Other studies Reviewed: Additional studies/ records that were reviewed today include: notes from cardiology 2015 TTE, holter myovue old ECG Notes Dr  [facility] .04/26/18     ASSESSMENT AND PLAN:  1.  Chest Pain:  Normal myovue 2015 and 2019 observe  2. HTN:  Well controlled.  Continue current medications and low sodium Dash type diet.   3. PAF:  Continue eliquis and atenolol quiescent CHADVASC 2 4. HLD :  Continue statin labs with primary  Seems to  have myalgias will hold statin for 6 weeks and see if helps OTC Co Enzyme Q 5. Preoperative:  Clear to have right shoulder surgery with Dr Linden Dolin Hold eliquis 48 hours before    Current medicines are reviewed at length with the patient today.  The patient does not have concerns regarding medicines.  The following changes have been made:  no change  Labs/ tests ordered today include: TTE Myovue  No orders of the defined types were placed in this encounter.    Disposition:   FU with cardiology 6 months     Signed, Jenkins Rouge, MD  09/27/2019 10:23 AM    [address], [place], [place]  [address] Phone: [phone]; Fax: [phone]

## 2019-09-27 ENCOUNTER — Ambulatory Visit (INDEPENDENT_AMBULATORY_CARE_PROVIDER_SITE_OTHER): Payer: Medicare Other | Admitting: Cardiovascular Disease

## 2019-09-27 ENCOUNTER — Other Ambulatory Visit: Payer: Self-pay

## 2019-09-27 ENCOUNTER — Encounter: Payer: Self-pay | Admitting: Cardiovascular Disease

## 2019-09-27 VITALS — BP 130/72 | HR 53 | Ht 71.0 in | Wt 195.0 lb

## 2019-09-27 DIAGNOSIS — Z01810 Encounter for preprocedural cardiovascular examination: Secondary | ICD-10-CM

## 2019-09-27 NOTE — Patient Instructions (Signed)
Medication Instructions:   *If you need a refill on your cardiac medications before your next appointment, please call your pharmacy*  Lab Work:  If you have labs (blood work) drawn today and your tests are completely normal, you will receive your results only by: Marland Kitchen MyChart Message (if you have MyChart) OR . A paper copy in the mail If you have any lab test that is abnormal or we need to change your treatment, we will call you to review the results.  Testing/Procedures: None ordered today  Follow-Up: At [facility], you and your health needs are our priority.  As part of our continuing mission to provide you with exceptional heart care, we have created designated Provider Care Teams.  These Care Teams include your primary Cardiologist (physician) and Advanced Practice Providers (APPs -  Physician Assistants and Nurse Practitioners) who all work together to provide you with the care you need, when you need it.  Your next appointment:   6 months - You will receive a reminder letter in the mail two months in advance. If you don't receive a letter, please call our office to schedule the follow-up appointment.  The format for your next appointment:   In Person  Provider:   You may see Jenkins Rouge, MD or one of the following Advanced Practice Providers on your designated Care Team:    Truitt Merle, NP  Cecilie Kicks, NP  Kathyrn Drown, NP

## 2019-12-27 DIAGNOSIS — M75101 Unspecified rotator cuff tear or rupture of right shoulder, not specified as traumatic: Secondary | ICD-10-CM | POA: Diagnosis not present

## 2019-12-27 DIAGNOSIS — M7501 Adhesive capsulitis of right shoulder: Secondary | ICD-10-CM | POA: Diagnosis not present

## 2019-12-27 DIAGNOSIS — M19011 Primary osteoarthritis, right shoulder: Secondary | ICD-10-CM | POA: Diagnosis not present

## 2020-01-14 DIAGNOSIS — S43491A Other sprain of right shoulder joint, initial encounter: Secondary | ICD-10-CM | POA: Diagnosis not present

## 2020-01-14 DIAGNOSIS — M75121 Complete rotator cuff tear or rupture of right shoulder, not specified as traumatic: Secondary | ICD-10-CM | POA: Diagnosis not present

## 2020-01-14 DIAGNOSIS — M19011 Primary osteoarthritis, right shoulder: Secondary | ICD-10-CM | POA: Diagnosis not present

## 2020-01-14 DIAGNOSIS — X58XXXA Exposure to other specified factors, initial encounter: Secondary | ICD-10-CM | POA: Diagnosis not present

## 2020-01-14 DIAGNOSIS — G8918 Other acute postprocedural pain: Secondary | ICD-10-CM | POA: Diagnosis not present

## 2020-01-14 DIAGNOSIS — Y999 Unspecified external cause status: Secondary | ICD-10-CM | POA: Diagnosis not present

## 2020-01-14 DIAGNOSIS — S43431A Superior glenoid labrum lesion of right shoulder, initial encounter: Secondary | ICD-10-CM | POA: Diagnosis not present

## 2020-01-14 DIAGNOSIS — M7581 Other shoulder lesions, right shoulder: Secondary | ICD-10-CM | POA: Diagnosis not present

## 2020-01-14 DIAGNOSIS — M7501 Adhesive capsulitis of right shoulder: Secondary | ICD-10-CM | POA: Diagnosis not present

## 2020-01-14 DIAGNOSIS — S46011A Strain of muscle(s) and tendon(s) of the rotator cuff of right shoulder, initial encounter: Secondary | ICD-10-CM | POA: Diagnosis not present

## 2020-01-14 HISTORY — PX: SHOULDER ARTHROSCOPY: SHX128

## 2020-01-16 DIAGNOSIS — Z23 Encounter for immunization: Secondary | ICD-10-CM | POA: Diagnosis not present

## 2020-01-17 ENCOUNTER — Ambulatory Visit: Payer: Medicare Other | Admitting: Physical Therapy

## 2020-01-21 ENCOUNTER — Other Ambulatory Visit: Payer: Self-pay

## 2020-01-21 ENCOUNTER — Ambulatory Visit: Payer: Medicare Other | Attending: Specialist | Admitting: Physical Therapy

## 2020-01-21 ENCOUNTER — Encounter: Payer: Self-pay | Admitting: Physical Therapy

## 2020-01-21 DIAGNOSIS — M25511 Pain in right shoulder: Secondary | ICD-10-CM | POA: Diagnosis not present

## 2020-01-21 DIAGNOSIS — R293 Abnormal posture: Secondary | ICD-10-CM | POA: Diagnosis not present

## 2020-01-21 NOTE — Therapy (Signed)
[facility] [place], [place], [address] Phone: [phone]   Fax:  [phone]  Physical Therapy Evaluation  Patient Details  Name: Joseph Larsen MRN: [id number] Date of Birth: 05-04-1946 No data recorded  Encounter Date: 01/21/2020  PT End of Session - 01/21/20 [id number]    Visit Number  1    Number of Visits  12    Date for PT Re-Evaluation  02/25/20    Authorization Type  FOTO, Progress note every 10th visit, KX modifier at 15th visit    PT Start Time  0816    PT Stop Time  0859    PT Time Calculation (min)  43 min    Equipment Utilized During Treatment  --   right sling with abduction pillow   Activity Tolerance  Patient tolerated treatment well    Behavior During Therapy  [facility] for tasks assessed/performed       Past Medical History:  Diagnosis Date  . Abnormal ECG    a. 08/2012 Abnl ETT with ST changes in recovery;  b. 08/2012 Ex MV, ex time 10:00, EF 66%, no ischemia/infarct.  . Allergic rhinitis   . Anxiety   . Cataract   . Depression   . Diverticular disease   . Dyspnea on exertion   . ETOH abuse   . GERD (gastroesophageal reflux disease)   . Gout   . Hemorrhoids   . Hiatal hernia   . HTN (hypertension)   . Low testosterone   . MACROCYTIC ANEMIA   . NONSPEC ELEVATION OF LEVELS OF TRANSAMINASE/LDH   . Nonspecific abnormal finding in stool contents   . Palpitations   . PTSD (post-traumatic stress disorder)   . PTSD (post-traumatic stress disorder)    [name]    Past Surgical History:  Procedure Laterality Date  . CHOLECYSTECTOMY  2004  . KNEE SURGERY Bilateral   . SHOULDER ARTHROSCOPY Right 01/14/2020    There were no vitals filed for this visit.   Subjective Assessment - 01/21/20 0837    Subjective  COVID-19 screening performed upon arrival.Patient arrives to physical therapy with right shoulder pain and difficulty performing ADLs due to a right shoulder scope on 01/14/2020. Patient reports  getting assistance from his wife for donning/doffing sling with abduction pillow, for dressing and for showering activities. Patient reports pain has been mild only requiring OTC pain medication at this time. Patient reports pain at worst as 10/10 and pain at best as 1-2/10. Patient reported increased redness and swelling to incisional areas that began after completing antibiotic medication. Patient's goals are to decrease pain, improve movement, improve strength, and return to performing home and recreational activities.    Patient is accompained by:  Family member   Suanne Marker, Wife   Limitations  Lifting;House hold activities    Patient Stated Goals  use arm again for home activities and shop work.    Currently in Pain?  Yes    Pain Score  2     Pain Location  Shoulder    Pain Orientation  Right    Pain Descriptors / Indicators  Aching;Sore    Pain Type  Surgical pain    Pain Onset  1 to 4 weeks ago    Pain Frequency  Constant    Aggravating Factors   too much movement, trying to sleep    Pain Relieving Factors  resting, OTC pai medication as    Effect of Pain on Daily Activities  unable to perform  ADLs without assistance.          [facility] PT Assessment - 01/21/20 0001      Prior Function   Level of Independence  Needs assistance with homemaking;Needs assistance with ADLs      Observation/Other Assessments   Observations  4 port incisions with stitches; notable redness and edema at each of the 4 incisional areas. dried drainage observed at posterior inscision.    Focus on Therapeutic Outcomes (FOTO)   96% limitation      ROM / Strength   AROM / PROM / Strength  PROM      PROM   PROM Assessment Site  Shoulder    Right/Left Shoulder  Right    Right Shoulder Flexion  23 Degrees    Right Shoulder External Rotation  -9 Degrees      Palpation   Palpation comment  minimal tenderness to palpation to biceps, triceps or posterior shoulder, avoided port incisional areas      Transfers    Comments  min A for supine to sit      Ambulation/Gait   Gait Pattern  Within Functional Limits                Objective measurements completed on examination: See above findings.              PT Education - 01/21/20 0928    Education Details  pendulums, lateral, AP, and circles    Person(s) Educated  Patient    Methods  Explanation;Demonstration;Handout    Comprehension  Verbalized understanding;Returned demonstration       PT Short Term Goals - 01/21/20 1332      PT SHORT TERM GOAL #1   Title  STG=LTG        PT Long Term Goals - 01/21/20 0931      PT LONG TERM GOAL #1   Title  Patient will be independent with HEP and its progression    Time  4    Period  Weeks    Status  New      PT LONG TERM GOAL #2   Title  Patient will demonstrate 140+ degrees of right shoulder flexion AROM to improve ability to perform overhead tasks.    Time  4    Period  Weeks    Status  New      PT LONG TERM GOAL #3   Title  Patient will demonstate 55+ degrees of right shoulder ER AROM to improve ability to don/doff apparel.    Time  4    Period  Weeks    Status  New      PT LONG TERM GOAL #4   Title  Patient will report ability to perform ADLs and home activities with right shoulder pain less than or equal to 4/10.    Time  4    Period  Weeks    Status  New      PT LONG TERM GOAL #5   Title  Patient will demonstrate 4/5 or greater right shoulder MMT in all planes to improve stability during functional tasks.    Time  4    Period  Weeks    Status  New             Plan - 01/21/20 1329    Clinical Impression Statement  Patient is a 73 year old male who presents to physical therapy with his wife with right shoulder pain, and decreased right shoulder PROM due to a  right shoulder scope 01/14/2020. Patient noted with increased redness and edema at all 4 port incisions. Patient instructed to call MD to address abnormality. Patient requires assistance for bed  mobility and donning/doffing sling. Patient and PT discussed plan of care as well as HEP. Patient reported understanding. Patient would benefit from skilled physical therapy to address deficits and address patient's goals.    Personal Factors and Comorbidities  Comorbidity 2    Comorbidities  right shoulder scope 01/14/2020, HTN    Examination-Activity Limitations  Bathing;Locomotion Level;Hygiene/Grooming;Dressing    Examination-Participation Restrictions  Driving    Stability/Clinical Decision Making  Stable/Uncomplicated    Clinical Decision Making  Low    Rehab Potential  Good    PT Frequency  3x / week    PT Duration  4 weeks    PT Treatment/Interventions  ADLs/Self Care Home Management;Cryotherapy;Electrical Stimulation;Iontophoresis 4mg /ml Dexamethasone;Moist Heat;Ultrasound;Neuromuscular re-education;Therapeutic exercise;Therapeutic activities;Patient/family education;Functional mobility training;Manual techniques;Passive range of motion;Vasopneumatic Device;Taping    PT Next Visit Plan  PROM to right shoulder, modalities once swelling and redness decreases    PT Home Exercise Plan  see patient education section    Consulted and Agree with Plan of Care  Patient       Patient will benefit from skilled therapeutic intervention in order to improve the following deficits and impairments:  Decreased activity tolerance, Decreased strength, Decreased range of motion, Pain, Postural dysfunction, Impaired UE functional use  Visit Diagnosis: Acute pain of right shoulder - Plan: PT plan of care cert/re-cert  Abnormal posture - Plan: PT plan of care cert/re-cert     Problem List Patient Active Problem List   Diagnosis Date Noted  . Aortic atherosclerosis ([place]) 03/01/2018  . Barrett's esophagus 01/03/2017  . Non-healing ulcer ([place]) 10/13/2015  . Neoplasm of uncertain behavior of skin 10/08/2015  . Skin lesion 09/07/2015  . Hypokalemia 03/03/2014  . BPH (benign prostatic hyperplasia)  03/03/2014  . Chest pain   . Palpitations   . Dyspnea on exertion   . ETOH abuse   . PTSD (post-traumatic stress disorder) 10/02/2013  . Abnormal stress test 08/29/2012  . GERD (gastroesophageal reflux disease)   . HTN (hypertension)   . Anxiety   . Depression   . Allergic rhinitis   . Diverticular disease   . Hemorrhoids   . Hiatal hernia   . Gout   . MACROCYTIC ANEMIA 08/09/2010  . NONSPEC ELEVATION OF LEVELS OF TRANSAMINASE/LDH 08/09/2010  . NONSPECIFIC ABNORMAL FINDING IN STOOL CONTENTS 08/09/2010    Gabriela Eves, PT, DPT 01/21/2020, 1:37 PM  [facility] [place], [place], [address] Phone: [phone]   Fax:  [phone]  Name: Joseph Larsen MRN: [id number] Date of Birth: [birth year]

## 2020-01-23 ENCOUNTER — Other Ambulatory Visit: Payer: Self-pay

## 2020-01-23 ENCOUNTER — Encounter: Payer: Self-pay | Admitting: Physical Therapy

## 2020-01-23 ENCOUNTER — Ambulatory Visit: Payer: Medicare Other | Admitting: Physical Therapy

## 2020-01-23 DIAGNOSIS — M25511 Pain in right shoulder: Secondary | ICD-10-CM | POA: Diagnosis not present

## 2020-01-23 DIAGNOSIS — R293 Abnormal posture: Secondary | ICD-10-CM

## 2020-01-23 NOTE — Therapy (Signed)
[facility] [place], [place], [address] Phone: [phone]   Fax:  [phone]  Physical Therapy Treatment  Patient Details  Name: Joseph Larsen MRN: [id number] Date of Birth: 17-Jun-1946 No data recorded  Encounter Date: 01/23/2020  PT End of Session - 01/23/20 0908    Visit Number  2    Number of Visits  12    Date for PT Re-Evaluation  02/25/20    Authorization Type  FOTO, Progress note every 10th visit, KX modifier at 15th visit    PT Start Time  0908    PT Stop Time  0949    PT Time Calculation (min)  41 min    Equipment Utilized During Treatment  Other (comment)   R shoulder sling with adductor pillow   Activity Tolerance  Patient tolerated treatment well    Behavior During Therapy  [facility] for tasks assessed/performed       Past Medical History:  Diagnosis Date  . Abnormal ECG    a. 08/2012 Abnl ETT with ST changes in recovery;  b. 08/2012 Ex MV, ex time 10:00, EF 66%, no ischemia/infarct.  . Allergic rhinitis   . Anxiety   . Cataract   . Depression   . Diverticular disease   . Dyspnea on exertion   . ETOH abuse   . GERD (gastroesophageal reflux disease)   . Gout   . Hemorrhoids   . Hiatal hernia   . HTN (hypertension)   . Low testosterone   . MACROCYTIC ANEMIA   . NONSPEC ELEVATION OF LEVELS OF TRANSAMINASE/LDH   . Nonspecific abnormal finding in stool contents   . Palpitations   . PTSD (post-traumatic stress disorder)   . PTSD (post-traumatic stress disorder)    [name]    Past Surgical History:  Procedure Laterality Date  . CHOLECYSTECTOMY  2004  . KNEE SURGERY Bilateral   . SHOULDER ARTHROSCOPY Right 01/14/2020    There were no vitals filed for this visit.  Subjective Assessment - 01/23/20 0903    Subjective  COVID 19 screening performed on patient upon arrival. Patient and wife report back that the MD says he may have been allergic to neosporin or bandaids. Now to use paper tape.     Patient is accompained by:  Family member   Wife, Suanne Marker   Limitations  Lifting;House hold activities    Patient Stated Goals  use arm again for home activities and shop work.    Currently in Pain?  Yes    Pain Score  5     Pain Location  Shoulder    Pain Orientation  Right    Pain Descriptors / Indicators  Aching    Pain Type  Surgical pain    Pain Onset  1 to 4 weeks ago    Pain Frequency  Constant                       [facility] Adult PT Treatment/Exercise - 01/23/20 0001      Modalities   Modalities  Vasopneumatic      Vasopneumatic   Number Minutes Vasopneumatic   15 minutes    Vasopnuematic Location   Shoulder    Vasopneumatic Pressure  Medium    Vasopneumatic Temperature   34/edema and pain      Manual Therapy   Manual Therapy  Passive ROM    Passive ROM  PROM of R shoulder into flex/ER/IR with gentle holds at  end range and oscillations to promote relaxation               PT Short Term Goals - 01/21/20 1332      PT SHORT TERM GOAL #1   Title  STG=LTG        PT Long Term Goals - 01/21/20 0931      PT LONG TERM GOAL #1   Title  Patient will be independent with HEP and its progression    Time  4    Period  Weeks    Status  New      PT LONG TERM GOAL #2   Title  Patient will demonstrate 140+ degrees of right shoulder flexion AROM to improve ability to perform overhead tasks.    Time  4    Period  Weeks    Status  New      PT LONG TERM GOAL #3   Title  Patient will demonstate 55+ degrees of right shoulder ER AROM to improve ability to don/doff apparel.    Time  4    Period  Weeks    Status  New      PT LONG TERM GOAL #4   Title  Patient will report ability to perform ADLs and home activities with right shoulder pain less than or equal to 4/10.    Time  4    Period  Weeks    Status  New      PT LONG TERM GOAL #5   Title  Patient will demonstrate 4/5 or greater right shoulder MMT in all planes to improve stability during  functional tasks.    Time  4    Period  Weeks    Status  New            Plan - 01/23/20 0944    Clinical Impression Statement  Patient presented in clinic with his wife after seeing surgeon's office due to concerns of port incisions. They were told that reaction was possibly due to neosporin or bandaid allergy. Patient notes that swelling and redness as reduced since seeing MD and stopping neosporin and ports uncovered. He was also prescribed an antibiotic as precaution for two weeks. Smooth arc of motion noted with R shoulder pROM in all directions although tighter at end range ER. Frequent oscillations provided throughout PROM to reduce muscle guarding and pain. Normal vasopnuematic response noted follwoing removal of the modalities.    Personal Factors and Comorbidities  Comorbidity 2    Comorbidities  right shoulder scope 01/14/2020, HTN    Examination-Activity Limitations  Bathing;Locomotion Level;Hygiene/Grooming;Dressing    Examination-Participation Restrictions  Driving    Stability/Clinical Decision Making  Stable/Uncomplicated    Rehab Potential  Good    PT Frequency  3x / week    PT Duration  4 weeks    PT Treatment/Interventions  ADLs/Self Care Home Management;Cryotherapy;Electrical Stimulation;Iontophoresis 4mg /ml Dexamethasone;Moist Heat;Ultrasound;Neuromuscular re-education;Therapeutic exercise;Therapeutic activities;Patient/family education;Functional mobility training;Manual techniques;Passive range of motion;Vasopneumatic Device;Taping    PT Next Visit Plan  PROM to right shoulder, modalities once swelling and redness decreases    PT Home Exercise Plan  see patient education section    Consulted and Agree with Plan of Care  Patient       Patient will benefit from skilled therapeutic intervention in order to improve the following deficits and impairments:  Decreased activity tolerance, Decreased strength, Decreased range of motion, Pain, Postural dysfunction, Impaired UE  functional use  Visit Diagnosis: Acute pain of right shoulder  Abnormal  posture     Problem List Patient Active Problem List   Diagnosis Date Noted  . Aortic atherosclerosis ([place]) 03/01/2018  . Barrett's esophagus 01/03/2017  . Non-healing ulcer ([place]) 10/13/2015  . Neoplasm of uncertain behavior of skin 10/08/2015  . Skin lesion 09/07/2015  . Hypokalemia 03/03/2014  . BPH (benign prostatic hyperplasia) 03/03/2014  . Chest pain   . Palpitations   . Dyspnea on exertion   . ETOH abuse   . PTSD (post-traumatic stress disorder) 10/02/2013  . Abnormal stress test 08/29/2012  . GERD (gastroesophageal reflux disease)   . HTN (hypertension)   . Anxiety   . Depression   . Allergic rhinitis   . Diverticular disease   . Hemorrhoids   . Hiatal hernia   . Gout   . MACROCYTIC ANEMIA 08/09/2010  . NONSPEC ELEVATION OF LEVELS OF TRANSAMINASE/LDH 08/09/2010  . NONSPECIFIC ABNORMAL FINDING IN STOOL CONTENTS 08/09/2010    Standley Brooking, PTA 01/23/2020, 10:25 AM  [facility] [address] [place], [place], [address] Phone: [phone]   Fax:  [phone]  Name: Joseph Larsen MRN: [id number] Date of Birth: [birth year]

## 2020-01-27 ENCOUNTER — Other Ambulatory Visit: Payer: Self-pay

## 2020-01-27 ENCOUNTER — Ambulatory Visit: Payer: Medicare Other | Admitting: Physical Therapy

## 2020-01-27 DIAGNOSIS — M25511 Pain in right shoulder: Secondary | ICD-10-CM

## 2020-01-27 DIAGNOSIS — R293 Abnormal posture: Secondary | ICD-10-CM

## 2020-01-27 DIAGNOSIS — Z4789 Encounter for other orthopedic aftercare: Secondary | ICD-10-CM | POA: Diagnosis not present

## 2020-01-27 NOTE — Therapy (Signed)
[facility] [place], [place], [address] Phone: [phone]   Fax:  [phone]  Physical Therapy Treatment  Patient Details  Name: Joseph Larsen MRN: [id number] Date of Birth: July 23, 1946 Referring Provider (PT): Sydnee Cabal, MD   Encounter Date: 01/27/2020  PT End of Session - 01/27/20 1532    Visit Number  3    Number of Visits  12    Authorization Type  FOTO, Progress note every 10th visit, KX modifier at 15th visit    PT Start Time  1430    PT Stop Time  1513    PT Time Calculation (min)  43 min    Equipment Utilized During Treatment  Other (comment)   right shoulder sling with abduction pillow   Activity Tolerance  Patient tolerated treatment well    Behavior During Therapy  [facility] for tasks assessed/performed       Past Medical History:  Diagnosis Date  . Abnormal ECG    a. 08/2012 Abnl ETT with ST changes in recovery;  b. 08/2012 Ex MV, ex time 10:00, EF 66%, no ischemia/infarct.  . Allergic rhinitis   . Anxiety   . Cataract   . Depression   . Diverticular disease   . Dyspnea on exertion   . ETOH abuse   . GERD (gastroesophageal reflux disease)   . Gout   . Hemorrhoids   . Hiatal hernia   . HTN (hypertension)   . Low testosterone   . MACROCYTIC ANEMIA   . NONSPEC ELEVATION OF LEVELS OF TRANSAMINASE/LDH   . Nonspecific abnormal finding in stool contents   . Palpitations   . PTSD (post-traumatic stress disorder)   . PTSD (post-traumatic stress disorder)    [name]    Past Surgical History:  Procedure Laterality Date  . CHOLECYSTECTOMY  2004  . KNEE SURGERY Bilateral   . SHOULDER ARTHROSCOPY Right 01/14/2020    There were no vitals filed for this visit.      [facility] PT Assessment - 01/27/20 0001      Assessment   Medical Diagnosis  Encounter for other orthopedic aftercare    Referring Provider (PT)  Sydnee Cabal, MD    Onset Date/Surgical Date  01/14/20      Precautions   Precautions  Shoulder    Type of Shoulder Precautions  R shoulder scope, possible RTC and biceps tenodesis                   [facility] Adult PT Treatment/Exercise - 01/27/20 0001      Modalities   Modalities  Vasopneumatic      Vasopneumatic   Number Minutes Vasopneumatic   15 minutes    Vasopnuematic Location   Shoulder    Vasopneumatic Pressure  Low    Vasopneumatic Temperature   34      Manual Therapy   Manual Therapy  Passive ROM    Passive ROM  PROM of R shoulder into flex/ER/IR with gentle holds at end range and oscillations to promote relaxation               PT Short Term Goals - 01/21/20 1332      PT SHORT TERM GOAL #1   Title  STG=LTG        PT Long Term Goals - 01/21/20 0931      PT LONG TERM GOAL #1   Title  Patient will be independent with HEP and its progression    Time  4    Period  Weeks    Status  New      PT LONG TERM GOAL #2   Title  Patient will demonstrate 140+ degrees of right shoulder flexion AROM to improve ability to perform overhead tasks.    Time  4    Period  Weeks    Status  New      PT LONG TERM GOAL #3   Title  Patient will demonstate 55+ degrees of right shoulder ER AROM to improve ability to don/doff apparel.    Time  4    Period  Weeks    Status  New      PT LONG TERM GOAL #4   Title  Patient will report ability to perform ADLs and home activities with right shoulder pain less than or equal to 4/10.    Time  4    Period  Weeks    Status  New      PT LONG TERM GOAL #5   Title  Patient will demonstrate 4/5 or greater right shoulder MMT in all planes to improve stability during functional tasks.    Time  4    Period  Weeks    Status  New            Plan - 01/27/20 1532    Clinical Impression Statement  Patient arrives to physical therapy with mild pain in the right shoulder. Patient noted with good response to PROM with notable improvements upon visual assessment. Port incisions are improving and are  less red and swollen. Stitches were removed today. No adverse affects upon removal of vasopneumatic device.    Comorbidities  right shoulder scope 01/14/2020, HTN    Examination-Activity Limitations  Bathing;Locomotion Level;Hygiene/Grooming;Dressing    Examination-Participation Restrictions  Driving    Stability/Clinical Decision Making  Stable/Uncomplicated    Clinical Decision Making  Low    Rehab Potential  Good    PT Frequency  3x / week    PT Duration  4 weeks    PT Treatment/Interventions  ADLs/Self Care Home Management;Cryotherapy;Electrical Stimulation;Iontophoresis 4mg /ml Dexamethasone;Moist Heat;Ultrasound;Neuromuscular re-education;Therapeutic exercise;Therapeutic activities;Patient/family education;Functional mobility training;Manual techniques;Passive range of motion;Vasopneumatic Device;Taping    PT Next Visit Plan  PROM to right shoulder, modalities once swelling and redness decreases    PT Home Exercise Plan  see patient education section    Consulted and Agree with Plan of Care  Patient       Patient will benefit from skilled therapeutic intervention in order to improve the following deficits and impairments:  Decreased activity tolerance, Decreased strength, Decreased range of motion, Pain, Postural dysfunction, Impaired UE functional use  Visit Diagnosis: Acute pain of right shoulder  Abnormal posture     Problem List Patient Active Problem List   Diagnosis Date Noted  . Aortic atherosclerosis ([place]) 03/01/2018  . Barrett's esophagus 01/03/2017  . Non-healing ulcer ([place]) 10/13/2015  . Neoplasm of uncertain behavior of skin 10/08/2015  . Skin lesion 09/07/2015  . Hypokalemia 03/03/2014  . BPH (benign prostatic hyperplasia) 03/03/2014  . Chest pain   . Palpitations   . Dyspnea on exertion   . ETOH abuse   . PTSD (post-traumatic stress disorder) 10/02/2013  . Abnormal stress test 08/29/2012  . GERD (gastroesophageal reflux disease)   . HTN (hypertension)   .  Anxiety   . Depression   . Allergic rhinitis   . Diverticular disease   . Hemorrhoids   . Hiatal hernia   . Gout   .  MACROCYTIC ANEMIA 08/09/2010  . NONSPEC ELEVATION OF LEVELS OF TRANSAMINASE/LDH 08/09/2010  . NONSPECIFIC ABNORMAL FINDING IN STOOL CONTENTS 08/09/2010    Gabriela Eves, PT, DPT 01/27/2020, 3:41 PM  [facility] [place], [place], [address] Phone: [phone]   Fax:  [phone]  Name: Joseph Larsen MRN: [id number] Date of Birth: 16-Sep-1946

## 2020-01-29 ENCOUNTER — Ambulatory Visit: Payer: Medicare Other | Admitting: Physical Therapy

## 2020-01-29 ENCOUNTER — Other Ambulatory Visit: Payer: Self-pay

## 2020-01-29 DIAGNOSIS — R293 Abnormal posture: Secondary | ICD-10-CM | POA: Diagnosis not present

## 2020-01-29 DIAGNOSIS — M25511 Pain in right shoulder: Secondary | ICD-10-CM | POA: Diagnosis not present

## 2020-01-29 NOTE — Therapy (Signed)
[facility] [place], [place], [address] Phone: [phone]   Fax:  [phone]  Physical Therapy Treatment  Patient Details  Name: Joseph Larsen MRN: [id number] Date of Birth: 10/22/1946 Referring Provider (PT): Sydnee Cabal, MD   Encounter Date: 01/29/2020  PT End of Session - 01/29/20 1134    Visit Number  4    Number of Visits  12    Date for PT Re-Evaluation  02/25/20    Authorization Type  FOTO, Progress note every 10th visit, KX modifier at 15th visit    PT Start Time  0900    PT Stop Time  0948    PT Time Calculation (min)  48 min    Equipment Utilized During Treatment  Other (comment)   right sling with abduction pillow   Activity Tolerance  Patient tolerated treatment well    Behavior During Therapy  [facility] for tasks assessed/performed       Past Medical History:  Diagnosis Date  . Abnormal ECG    a. 08/2012 Abnl ETT with ST changes in recovery;  b. 08/2012 Ex MV, ex time 10:00, EF 66%, no ischemia/infarct.  . Allergic rhinitis   . Anxiety   . Cataract   . Depression   . Diverticular disease   . Dyspnea on exertion   . ETOH abuse   . GERD (gastroesophageal reflux disease)   . Gout   . Hemorrhoids   . Hiatal hernia   . HTN (hypertension)   . Low testosterone   . MACROCYTIC ANEMIA   . NONSPEC ELEVATION OF LEVELS OF TRANSAMINASE/LDH   . Nonspecific abnormal finding in stool contents   . Palpitations   . PTSD (post-traumatic stress disorder)   . PTSD (post-traumatic stress disorder)    [name]    Past Surgical History:  Procedure Laterality Date  . CHOLECYSTECTOMY  2004  . KNEE SURGERY Bilateral   . SHOULDER ARTHROSCOPY Right 01/14/2020    There were no vitals filed for this visit.  Subjective Assessment - 01/29/20 0906    Subjective  COVID 19 screening performed on patient upon arrival. Reports was able to sleep 5 hrs last night. Took two tylenol this morning before arriving to PT.     Limitations  Lifting;House hold activities    Patient Stated Goals  use arm again for home activities and shop work.    Currently in Pain?  Yes   did not provide number on pain scale   Pain Location  Shoulder    Pain Orientation  Right    Pain Descriptors / Indicators  Aching;Discomfort    Pain Type  Surgical pain    Pain Onset  1 to 4 weeks ago    Pain Frequency  Constant         [facility] PT Assessment - 01/29/20 0001      Assessment   Medical Diagnosis  Encounter for other orthopedic aftercare    Referring Provider (PT)  Sydnee Cabal, MD    Onset Date/Surgical Date  01/14/20      Precautions   Precautions  Shoulder    Type of Shoulder Precautions  R shoulder scope, possible RTC and biceps tenodesis                   [facility] Adult PT Treatment/Exercise - 01/29/20 0001      Modalities   Modalities  Vasopneumatic      Vasopneumatic   Number Minutes Vasopneumatic   15  minutes    Vasopnuematic Location   Shoulder    Vasopneumatic Pressure  Low    Vasopneumatic Temperature   34      Manual Therapy   Manual Therapy  Passive ROM    Passive ROM  PROM of R shoulder into flex/ER/IR with gentle holds at end range and oscillations to promote relaxation               PT Short Term Goals - 01/21/20 1332      PT SHORT TERM GOAL #1   Title  STG=LTG        PT Long Term Goals - 01/21/20 0931      PT LONG TERM GOAL #1   Title  Patient will be independent with HEP and its progression    Time  4    Period  Weeks    Status  New      PT LONG TERM GOAL #2   Title  Patient will demonstrate 140+ degrees of right shoulder flexion AROM to improve ability to perform overhead tasks.    Time  4    Period  Weeks    Status  New      PT LONG TERM GOAL #3   Title  Patient will demonstate 55+ degrees of right shoulder ER AROM to improve ability to don/doff apparel.    Time  4    Period  Weeks    Status  New      PT LONG TERM GOAL #4   Title  Patient will  report ability to perform ADLs and home activities with right shoulder pain less than or equal to 4/10.    Time  4    Period  Weeks    Status  New      PT LONG TERM GOAL #5   Title  Patient will demonstrate 4/5 or greater right shoulder MMT in all planes to improve stability during functional tasks.    Time  4    Period  Weeks    Status  New            Plan - 01/29/20 1137    Clinical Impression Statement  Patient responded well to therapy session with ongoing discomfort at end range. Patient noted with less muscle guarding but with a slight increase of ER stiffness. Patient's port incisions have made significant improvement but patient instructed we will wait until next visit to perform electrical stimulation to allow for adequate healing of the skin around the port incisions. Patient reported understanding. Normal response to vasopneumatic device upon removal.    Personal Factors and Comorbidities  Comorbidity 2    Comorbidities  right shoulder scope 01/14/2020, HTN    Examination-Activity Limitations  Bathing;Locomotion Level;Hygiene/Grooming;Dressing    Examination-Participation Restrictions  Driving    Stability/Clinical Decision Making  Stable/Uncomplicated    Clinical Decision Making  Low    Rehab Potential  Good    PT Frequency  3x / week    PT Duration  4 weeks    PT Treatment/Interventions  ADLs/Self Care Home Management;Cryotherapy;Electrical Stimulation;Iontophoresis 4mg /ml Dexamethasone;Moist Heat;Ultrasound;Neuromuscular re-education;Therapeutic exercise;Therapeutic activities;Patient/family education;Functional mobility training;Manual techniques;Passive range of motion;Vasopneumatic Device;Taping    PT Next Visit Plan  PROM to right shoulder, modalities once swelling and redness decreases    PT Home Exercise Plan  see patient education section    Consulted and Agree with Plan of Care  Patient       Patient will benefit from skilled therapeutic intervention in order  to improve the following deficits and impairments:  Decreased activity tolerance, Decreased strength, Decreased range of motion, Pain, Postural dysfunction, Impaired UE functional use  Visit Diagnosis: Acute pain of right shoulder  Abnormal posture     Problem List Patient Active Problem List   Diagnosis Date Noted  . Aortic atherosclerosis ([place] ) 03/01/2018  . Barrett's esophagus 01/03/2017  . Non-healing ulcer ([place]) 10/13/2015  . Neoplasm of uncertain behavior of skin 10/08/2015  . Skin lesion 09/07/2015  . Hypokalemia 03/03/2014  . BPH (benign prostatic hyperplasia) 03/03/2014  . Chest pain   . Palpitations   . Dyspnea on exertion   . ETOH abuse   . PTSD (post-traumatic stress disorder) 10/02/2013  . Abnormal stress test 08/29/2012  . GERD (gastroesophageal reflux disease)   . HTN (hypertension)   . Anxiety   . Depression   . Allergic rhinitis   . Diverticular disease   . Hemorrhoids   . Hiatal hernia   . Gout   . MACROCYTIC ANEMIA 08/09/2010  . NONSPEC ELEVATION OF LEVELS OF TRANSAMINASE/LDH 08/09/2010  . NONSPECIFIC ABNORMAL FINDING IN STOOL CONTENTS 08/09/2010    Gabriela Eves, PT, DPT 01/29/2020, 11:48 AM  [facility] [place], [place], [address] Phone: [phone]   Fax:  [phone]  Name: JAHCERE DERR MRN: [id number] Date of Birth: 1946/05/16

## 2020-01-31 ENCOUNTER — Other Ambulatory Visit: Payer: Self-pay

## 2020-01-31 ENCOUNTER — Ambulatory Visit: Payer: Medicare Other | Admitting: *Deleted

## 2020-01-31 DIAGNOSIS — R293 Abnormal posture: Secondary | ICD-10-CM

## 2020-01-31 DIAGNOSIS — M25511 Pain in right shoulder: Secondary | ICD-10-CM

## 2020-01-31 NOTE — Therapy (Addendum)
pain   . Palpitations   . Dyspnea on exertion   . ETOH abuse   . PTSD (post-traumatic stress disorder) 10/02/2013  . Abnormal stress test 08/29/2012  . GERD (gastroesophageal reflux disease)   . HTN (hypertension)   . Anxiety   . Depression   . Allergic rhinitis   . Diverticular disease   . Hemorrhoids   . Hiatal hernia   . Gout   . MACROCYTIC ANEMIA 08/09/2010  . NONSPEC ELEVATION OF LEVELS OF TRANSAMINASE/LDH 08/09/2010  . NONSPECIFIC ABNORMAL FINDING IN STOOL CONTENTS 08/09/2010    Darlina Mccaughey,CHRIS, PTA 01/31/2020, 12:43 PM  [facility] [place], [place], [address] Phone: [phone]   Fax:  [phone]  Name: Joseph Larsen MRN: [id number] Date of Birth: 09-13-1946  [facility] [place], [place], [address] Phone: [phone]   Fax:  [phone]  Physical Therapy Treatment  Patient Details  Name: Joseph Larsen MRN: [id number] Date of Birth: 01/26/1946 Referring Provider (PT): Sydnee Cabal, MD   Encounter Date: 01/31/2020  PT End of Session - 01/31/20 1033    Visit Number  5    Number of Visits  12    Date for PT Re-Evaluation  02/25/20    Authorization Type  FOTO, Progress note every 10th visit, KX modifier at 15th visit    PT Start Time  0900    PT Stop Time  0954    PT Time Calculation (min)  54 min       Past Medical History:  Diagnosis Date  . Abnormal ECG    a. 08/2012 Abnl ETT with ST changes in recovery;  b. 08/2012 Ex MV, ex time 10:00, EF 66%, no ischemia/infarct.  . Allergic rhinitis   . Anxiety   . Cataract   . Depression   . Diverticular disease   . Dyspnea on exertion   . ETOH abuse   . GERD (gastroesophageal reflux disease)   . Gout   . Hemorrhoids   . Hiatal hernia   . HTN (hypertension)   . Low testosterone   . MACROCYTIC ANEMIA   . NONSPEC ELEVATION OF LEVELS OF TRANSAMINASE/LDH   . Nonspecific abnormal finding in stool contents   . Palpitations   . PTSD (post-traumatic stress disorder)   . PTSD (post-traumatic stress disorder)    [name]    Past Surgical History:  Procedure Laterality Date  . CHOLECYSTECTOMY  2004  . KNEE SURGERY Bilateral   . SHOULDER ARTHROSCOPY Right 01/14/2020    There were no vitals filed for this visit.  Subjective Assessment - 01/31/20 1031    Subjective  COVID 19 screening performed on patient upon arrival. MD office to send OP report    Patient is accompained by:  Family member    Limitations  Lifting;House hold activities    Patient Stated Goals  use arm again for home activities and shop work.    Currently in Pain?  Yes    Pain Score  5     Pain Location  Shoulder    Pain Orientation  Right    Pain  Descriptors / Indicators  Aching;Discomfort    Pain Onset  1 to 4 weeks ago                       [facility] Adult PT Treatment/Exercise - 01/31/20 0001      Exercises   Exercises  Shoulder      Shoulder Exercises: Supine   External Rotation  PROM;10 reps;Right   with cane for HEP     Shoulder Exercises: Seated   Flexion  PROM;Right   on table for HEP     Modalities   Modalities  Vasopneumatic      Vasopneumatic   Number Minutes Vasopneumatic   15 minutes    Vasopnuematic Location   Shoulder    Vasopneumatic Pressure  Low    Vasopneumatic Temperature   34      Manual Therapy   Manual Therapy  Passive ROM    Passive ROM  PROM of R shoulder into flex/ER/IR with gentle holds at end range and oscillations to promote relaxation     Premod x 15 mins 80-150hz   To RT shldr with VASO  pain   . Palpitations   . Dyspnea on exertion   . ETOH abuse   . PTSD (post-traumatic stress disorder) 10/02/2013  . Abnormal stress test 08/29/2012  . GERD (gastroesophageal reflux disease)   . HTN (hypertension)   . Anxiety   . Depression   . Allergic rhinitis   . Diverticular disease   . Hemorrhoids   . Hiatal hernia   . Gout   . MACROCYTIC ANEMIA 08/09/2010  . NONSPEC ELEVATION OF LEVELS OF TRANSAMINASE/LDH 08/09/2010  . NONSPECIFIC ABNORMAL FINDING IN STOOL CONTENTS 08/09/2010    Darlina Mccaughey,CHRIS, PTA 01/31/2020, 12:43 PM  [facility] [place], [place], [address] Phone: [phone]   Fax:  [phone]  Name: Joseph Larsen MRN: [id number] Date of Birth: 09-13-1946

## 2020-02-03 ENCOUNTER — Other Ambulatory Visit: Payer: Self-pay

## 2020-02-03 ENCOUNTER — Ambulatory Visit: Payer: Medicare Other | Admitting: Physical Therapy

## 2020-02-03 DIAGNOSIS — M25511 Pain in right shoulder: Secondary | ICD-10-CM

## 2020-02-03 DIAGNOSIS — R293 Abnormal posture: Secondary | ICD-10-CM | POA: Diagnosis not present

## 2020-02-03 NOTE — Therapy (Addendum)
[facility] [place], [place], [address] Phone: [phone]   Fax:  [phone]  Physical Therapy Treatment  Patient Details  Name: Joseph Larsen MRN: [id number] Date of Birth: 27-Sep-1946 Referring Provider (PT): Sydnee Cabal, MD   Encounter Date: 02/03/2020  PT End of Session - 02/03/20 0942    Visit Number  6    Number of Visits  12    Date for PT Re-Evaluation  02/25/20    Authorization Type  FOTO, Progress note every 10th visit, KX modifier at 15th visit    PT Start Time  0900    PT Stop Time  0953    PT Time Calculation (min)  53 min    Equipment Utilized During Treatment  Other (comment)    Activity Tolerance  Patient tolerated treatment well    Behavior During Therapy  [facility] for tasks assessed/performed       Past Medical History:  Diagnosis Date  . Abnormal ECG    a. 08/2012 Abnl ETT with ST changes in recovery;  b. 08/2012 Ex MV, ex time 10:00, EF 66%, no ischemia/infarct.  . Allergic rhinitis   . Anxiety   . Cataract   . Depression   . Diverticular disease   . Dyspnea on exertion   . ETOH abuse   . GERD (gastroesophageal reflux disease)   . Gout   . Hemorrhoids   . Hiatal hernia   . HTN (hypertension)   . Low testosterone   . MACROCYTIC ANEMIA   . NONSPEC ELEVATION OF LEVELS OF TRANSAMINASE/LDH   . Nonspecific abnormal finding in stool contents   . Palpitations   . PTSD (post-traumatic stress disorder)   . PTSD (post-traumatic stress disorder)    [name]    Past Surgical History:  Procedure Laterality Date  . CHOLECYSTECTOMY  2004  . KNEE SURGERY Bilateral   . SHOULDER ARTHROSCOPY Right 01/14/2020    There were no vitals filed for this visit.  Subjective Assessment - 02/03/20 1108    Subjective  COVID 19 screening performed on patient upon arrival. Patient reports pain with HEP    Patient is accompained by:  Family member    Limitations  Lifting;House hold activities    Patient  Stated Goals  use arm again for home activities and shop work.    Currently in Pain?  Yes   did not provide number on pain scale        [facility] PT Assessment - 02/03/20 0001      Assessment   Medical Diagnosis  Encounter for other orthopedic aftercare                   [facility] Adult PT Treatment/Exercise - 02/03/20 0001      Exercises   Exercises  Shoulder      Modalities   Modalities  Vasopneumatic;Electrical Stimulation      Electrical Stimulation   Electrical Stimulation Location  15    Electrical Stimulation Action  IFC    Electrical Stimulation Parameters  80-150 hz x15 mins    Electrical Stimulation Goals  Pain      Vasopneumatic   Number Minutes Vasopneumatic   15 minutes    Vasopnuematic Location   Shoulder    Vasopneumatic Pressure  Low    Vasopneumatic Temperature   34      Manual Therapy   Manual Therapy  Passive ROM    Passive ROM  PROM of R shoulder into flex/ER/IR  with gentle holds at end range and oscillations to promote relaxation               PT Short Term Goals - 01/21/20 1332      PT SHORT TERM GOAL #1   Title  STG=LTG        PT Long Term Goals - 01/21/20 0931      PT LONG TERM GOAL #1   Title  Patient will be independent with HEP and its progression    Time  4    Period  Weeks    Status  New      PT LONG TERM GOAL #2   Title  Patient will demonstrate 140+ degrees of right shoulder flexion AROM to improve ability to perform overhead tasks.    Time  4    Period  Weeks    Status  New      PT LONG TERM GOAL #3   Title  Patient will demonstate 55+ degrees of right shoulder ER AROM to improve ability to don/doff apparel.    Time  4    Period  Weeks    Status  New      PT LONG TERM GOAL #4   Title  Patient will report ability to perform ADLs and home activities with right shoulder pain less than or equal to 4/10.    Time  4    Period  Weeks    Status  New      PT LONG TERM GOAL #5   Title  Patient will demonstrate  4/5 or greater right shoulder MMT in all planes to improve stability during functional tasks.    Time  4    Period  Weeks    Status  New            Plan - 02/03/20 [id number]    Clinical Impression Statement  Patient responded fairly well to right shoulder PROM but with pain and muscle guarding particularly with ER. Patient's port incisions and scars significantly improved. Patient and PT reviewed HEP to which patient reported understanding. Normal response to modalities upon removal.    Personal Factors and Comorbidities  Comorbidity 2    Comorbidities  right shoulder scope 01/14/2020, HTN    Examination-Activity Limitations  Bathing;Locomotion Level;Hygiene/Grooming;Dressing    Examination-Participation Restrictions  Driving    Stability/Clinical Decision Making  Stable/Uncomplicated    Clinical Decision Making  Low    Rehab Potential  Good    PT Frequency  3x / week    PT Duration  4 weeks    PT Treatment/Interventions  ADLs/Self Care Home Management;Cryotherapy;Electrical Stimulation;Iontophoresis 4mg /ml Dexamethasone;Moist Heat;Ultrasound;Neuromuscular re-education;Therapeutic exercise;Therapeutic activities;Patient/family education;Functional mobility training;Manual techniques;Passive range of motion;Vasopneumatic Device;Taping    PT Next Visit Plan  PROM to right shoulder, modalities once swelling and redness decreases    PT Home Exercise Plan  see patient education section    Consulted and Agree with Plan of Care  Patient       Patient will benefit from skilled therapeutic intervention in order to improve the following deficits and impairments:  Decreased activity tolerance, Decreased strength, Decreased range of motion, Pain, Postural dysfunction, Impaired UE functional use  Visit Diagnosis: Acute pain of right shoulder  Abnormal posture     Problem List Patient Active Problem List   Diagnosis Date Noted  . Aortic atherosclerosis ([place]) 03/01/2018  . Barrett's esophagus  01/03/2017  . Non-healing ulcer ([place]) 10/13/2015  . Neoplasm of uncertain behavior of skin 10/08/2015  .  Skin lesion 09/07/2015  . Hypokalemia 03/03/2014  . BPH (benign prostatic hyperplasia) 03/03/2014  . Chest pain   . Palpitations   . Dyspnea on exertion   . ETOH abuse   . PTSD (post-traumatic stress disorder) 10/02/2013  . Abnormal stress test 08/29/2012  . GERD (gastroesophageal reflux disease)   . HTN (hypertension)   . Anxiety   . Depression   . Allergic rhinitis   . Diverticular disease   . Hemorrhoids   . Hiatal hernia   . Gout   . MACROCYTIC ANEMIA 08/09/2010  . NONSPEC ELEVATION OF LEVELS OF TRANSAMINASE/LDH 08/09/2010  . NONSPECIFIC ABNORMAL FINDING IN STOOL CONTENTS 08/09/2010    Gabriela Eves, PT, DPT 02/03/2020, 11:14 AM  [facility] [address] [place], [place], [address] Phone: [phone]   Fax:  [phone]  Name: Joseph Larsen MRN: [id number] Date of Birth: [birth year]

## 2020-02-05 ENCOUNTER — Ambulatory Visit: Payer: Medicare Other | Admitting: Physical Therapy

## 2020-02-05 ENCOUNTER — Other Ambulatory Visit: Payer: Self-pay

## 2020-02-05 ENCOUNTER — Encounter: Payer: Self-pay | Admitting: Physical Therapy

## 2020-02-05 DIAGNOSIS — M25511 Pain in right shoulder: Secondary | ICD-10-CM

## 2020-02-05 DIAGNOSIS — R293 Abnormal posture: Secondary | ICD-10-CM | POA: Diagnosis not present

## 2020-02-05 NOTE — Therapy (Signed)
[facility] [place], [place], [address] Phone: [phone]   Fax:  [phone]  Physical Therapy Treatment  Patient Details  Name: Joseph Larsen MRN: [id number] Date of Birth: Sep 01, 1946 Referring Provider (PT): Sydnee Cabal, MD   Encounter Date: 02/05/2020  PT End of Session - 02/05/20 1311    Visit Number  7    Number of Visits  12    Date for PT Re-Evaluation  02/25/20    Authorization Type  FOTO, Progress note every 10th visit, KX modifier at 15th visit    PT Start Time  0900    PT Stop Time  0957    PT Time Calculation (min)  57 min    Equipment Utilized During Treatment  Other (comment)   sling with abduction pillow   Activity Tolerance  Patient tolerated treatment well    Behavior During Therapy  [facility] for tasks assessed/performed       Past Medical History:  Diagnosis Date  . Abnormal ECG    a. 08/2012 Abnl ETT with ST changes in recovery;  b. 08/2012 Ex MV, ex time 10:00, EF 66%, no ischemia/infarct.  . Allergic rhinitis   . Anxiety   . Cataract   . Depression   . Diverticular disease   . Dyspnea on exertion   . ETOH abuse   . GERD (gastroesophageal reflux disease)   . Gout   . Hemorrhoids   . Hiatal hernia   . HTN (hypertension)   . Low testosterone   . MACROCYTIC ANEMIA   . NONSPEC ELEVATION OF LEVELS OF TRANSAMINASE/LDH   . Nonspecific abnormal finding in stool contents   . Palpitations   . PTSD (post-traumatic stress disorder)   . PTSD (post-traumatic stress disorder)    [name]    Past Surgical History:  Procedure Laterality Date  . CHOLECYSTECTOMY  2004  . KNEE SURGERY Bilateral   . SHOULDER ARTHROSCOPY Right 01/14/2020    There were no vitals filed for this visit.  Subjective Assessment - 02/05/20 1310    Subjective  COVID 19 screening performed on patient upon arrival. Patient arrives with ongoing pain with movement but minimal at rest.    Limitations  Lifting;House hold  activities    Patient Stated Goals  use arm again for home activities and shop work.    Currently in Pain?  Other (Comment)         [facility] PT Assessment - 02/05/20 0001      Assessment   Medical Diagnosis  Encounter for other orthopedic aftercare    Referring Provider (PT)  Sydnee Cabal, MD    Onset Date/Surgical Date  01/14/20      Precautions   Precautions  Shoulder    Type of Shoulder Precautions  R shoulder scope, possible RTC and biceps tenodesis      PROM   Right Shoulder External Rotation  29 Degrees                   [facility] Adult PT Treatment/Exercise - 02/05/20 0001      Exercises   Exercises  Shoulder      Shoulder Exercises: Supine   External Rotation  PROM;10 reps;Right   with wand     Modalities   Modalities  Vasopneumatic;Electrical Stimulation      Electrical Stimulation   Electrical Stimulation Location  15    Electrical Stimulation Action  IFC    Electrical Stimulation Parameters  80-150 hz x15 mins  Electrical Stimulation Goals  Pain      Vasopneumatic   Number Minutes Vasopneumatic   15 minutes    Vasopnuematic Location   Shoulder    Vasopneumatic Pressure  Low    Vasopneumatic Temperature   34      Manual Therapy   Manual Therapy  Passive ROM    Passive ROM  PROM of R shoulder into flex/ER/IR with gentle holds at end range and oscillations to promote relaxation               PT Short Term Goals - 01/21/20 1332      PT SHORT TERM GOAL #1   Title  STG=LTG        PT Long Term Goals - 01/21/20 0931      PT LONG TERM GOAL #1   Title  Patient will be independent with HEP and its progression    Time  4    Period  Weeks    Status  New      PT LONG TERM GOAL #2   Title  Patient will demonstrate 140+ degrees of right shoulder flexion AROM to improve ability to perform overhead tasks.    Time  4    Period  Weeks    Status  New      PT LONG TERM GOAL #3   Title  Patient will demonstate 55+ degrees of right  shoulder ER AROM to improve ability to don/doff apparel.    Time  4    Period  Weeks    Status  New      PT LONG TERM GOAL #4   Title  Patient will report ability to perform ADLs and home activities with right shoulder pain less than or equal to 4/10.    Time  4    Period  Weeks    Status  New      PT LONG TERM GOAL #5   Title  Patient will demonstrate 4/5 or greater right shoulder MMT in all planes to improve stability during functional tasks.    Time  4    Period  Weeks    Status  New            Plan - 02/05/20 1312    Clinical Impression Statement  Patient responded well to therapy session but with ongoing discomfort with end range PROM. PROM of ER measured at 29 degrees. Patient instructed to continue HEP and using pain as the guide. Patient noted with normal response to modalities upon removal.    Personal Factors and Comorbidities  Comorbidity 2    Comorbidities  right shoulder scope 01/14/2020, HTN    Examination-Activity Limitations  Bathing;Locomotion Level;Hygiene/Grooming;Dressing    Examination-Participation Restrictions  Driving    Stability/Clinical Decision Making  Stable/Uncomplicated    Clinical Decision Making  Low    Rehab Potential  Good    PT Frequency  3x / week    PT Duration  4 weeks    PT Treatment/Interventions  ADLs/Self Care Home Management;Cryotherapy;Electrical Stimulation;Iontophoresis 4mg /ml Dexamethasone;Moist Heat;Ultrasound;Neuromuscular re-education;Therapeutic exercise;Therapeutic activities;Patient/family education;Functional mobility training;Manual techniques;Passive range of motion;Vasopneumatic Device;Taping    PT Next Visit Plan  PROM to right shoulder, modalities for pain relief    PT Home Exercise Plan  see patient education section    Consulted and Agree with Plan of Care  Patient       Patient will benefit from skilled therapeutic intervention in order to improve the following deficits and impairments:  Decreased activity  tolerance, Decreased strength, Decreased range of motion, Pain, Postural dysfunction, Impaired UE functional use  Visit Diagnosis: Acute pain of right shoulder  Abnormal posture     Problem List Patient Active Problem List   Diagnosis Date Noted  . Aortic atherosclerosis ([place]) 03/01/2018  . Barrett's esophagus 01/03/2017  . Non-healing ulcer ([place]) 10/13/2015  . Neoplasm of uncertain behavior of skin 10/08/2015  . Skin lesion 09/07/2015  . Hypokalemia 03/03/2014  . BPH (benign prostatic hyperplasia) 03/03/2014  . Chest pain   . Palpitations   . Dyspnea on exertion   . ETOH abuse   . PTSD (post-traumatic stress disorder) 10/02/2013  . Abnormal stress test 08/29/2012  . GERD (gastroesophageal reflux disease)   . HTN (hypertension)   . Anxiety   . Depression   . Allergic rhinitis   . Diverticular disease   . Hemorrhoids   . Hiatal hernia   . Gout   . MACROCYTIC ANEMIA 08/09/2010  . NONSPEC ELEVATION OF LEVELS OF TRANSAMINASE/LDH 08/09/2010  . NONSPECIFIC ABNORMAL FINDING IN STOOL CONTENTS 08/09/2010    Gabriela Eves, PT, DPT 02/05/2020, 1:19 PM  [facility] [address] [place], [place], [address] Phone: [phone]   Fax:  [phone]  Name: MEL MARCELENO MRN: [id number] Date of Birth: 1946/10/21

## 2020-02-07 ENCOUNTER — Other Ambulatory Visit: Payer: Self-pay

## 2020-02-07 ENCOUNTER — Ambulatory Visit: Payer: Medicare Other | Admitting: Physical Therapy

## 2020-02-07 DIAGNOSIS — R293 Abnormal posture: Secondary | ICD-10-CM | POA: Diagnosis not present

## 2020-02-07 DIAGNOSIS — M25511 Pain in right shoulder: Secondary | ICD-10-CM

## 2020-02-07 NOTE — Therapy (Signed)
[facility] [address] [place], [place], [address] Phone: [phone]   Fax:  [phone]  Physical Therapy Treatment  Patient Details  Name: Joseph Larsen MRN: [id number] Date of Birth: 07/26/1946 Referring Provider (PT): Eugenia Mcalpine, MD   Encounter Date: 02/07/2020  PT End of Session - 02/07/20 1200    Visit Number  8    Number of Visits  12    Date for PT Re-Evaluation  02/25/20    Authorization Type  FOTO, Progress note every 10th visit, KX modifier at 15th visit    PT Start Time  0900    PT Stop Time  0954    PT Time Calculation (min)  54 min    Activity Tolerance  Patient tolerated treatment well    Behavior During Therapy  [facility] for tasks assessed/performed       Past Medical History:  Diagnosis Date  . Abnormal ECG    a. 08/2012 Abnl ETT with ST changes in recovery;  b. 08/2012 Ex MV, ex time 10:00, EF 66%, no ischemia/infarct.  . Allergic rhinitis   . Anxiety   . Cataract   . Depression   . Diverticular disease   . Dyspnea on exertion   . ETOH abuse   . GERD (gastroesophageal reflux disease)   . Gout   . Hemorrhoids   . Hiatal hernia   . HTN (hypertension)   . Low testosterone   . MACROCYTIC ANEMIA   . NONSPEC ELEVATION OF LEVELS OF TRANSAMINASE/LDH   . Nonspecific abnormal finding in stool contents   . Palpitations   . PTSD (post-traumatic stress disorder)   . PTSD (post-traumatic stress disorder)    [place] V[employer] - [employer]    Past Surgical History:  Procedure Laterality Date  . CHOLECYSTECTOMY  2004  . KNEE SURGERY Bilateral   . SHOULDER ARTHROSCOPY Right 01/14/2020    There were no vitals filed for this visit.  Subjective Assessment - 02/07/20 0950    Subjective  COVID-19 screen performed prior to patient entering clinic.    Patient is accompained by:  Family member    Limitations  Lifting;House hold activities         [facility] PT Assessment - 02/07/20 0001      PROM   Right Shoulder Flexion  120  Degrees    Right Shoulder External Rotation  43 Degrees                   [facility] Adult PT Treatment/Exercise - 02/07/20 0001      Modalities   Modalities  Electrical Stimulation;Vasopneumatic      Electrical Stimulation   Electrical Stimulation Location  20    Electrical Stimulation Action  IFC    Electrical Stimulation Parameters  80-150 Hz x 20 minutes.    Electrical Stimulation Goals  Pain      Vasopneumatic   Number Minutes Vasopneumatic   20 minutes    Vasopnuematic Location   --   Right shoulder.   Vasopneumatic Pressure  Low   Pillow between right elbow and thorax.     Manual Therapy   Manual Therapy  Passive ROM    Passive ROM  PROM in supine to patient's right shoulder x 24 minutes with focus on flexion and ER.               PT Short Term Goals - 01/21/20 1332      PT SHORT TERM GOAL #1   Title  STG=LTG  PT Long Term Goals - 01/21/20 0931      PT LONG TERM GOAL #1   Title  Patient will be independent with HEP and its progression    Time  4    Period  Weeks    Status  New      PT LONG TERM GOAL #2   Title  Patient will demonstrate 140+ degrees of right shoulder flexion AROM to improve ability to perform overhead tasks.    Time  4    Period  Weeks    Status  New      PT LONG TERM GOAL #3   Title  Patient will demonstate 55+ degrees of right shoulder ER AROM to improve ability to don/doff apparel.    Time  4    Period  Weeks    Status  New      PT LONG TERM GOAL #4   Title  Patient will report ability to perform ADLs and home activities with right shoulder pain less than or equal to 4/10.    Time  4    Period  Weeks    Status  New      PT LONG TERM GOAL #5   Title  Patient will demonstrate 4/5 or greater right shoulder MMT in all planes to improve stability during functional tasks.    Time  4    Period  Weeks    Status  New            Plan - 02/07/20 1102    Clinical Impression Statement  Reviewed HEP.   Excellent technique with supine cane exercise to increase right shoulder ER.  Very good range of motion improvement.    Personal Factors and Comorbidities  Comorbidity 2    Comorbidities  right shoulder scope 01/14/2020, HTN    Examination-Activity Limitations  Bathing;Locomotion Level;Hygiene/Grooming;Dressing    Examination-Participation Restrictions  Driving    Stability/Clinical Decision Making  Stable/Uncomplicated    Rehab Potential  Good    PT Frequency  3x / week    PT Duration  4 weeks    PT Treatment/Interventions  ADLs/Self Care Home Management;Cryotherapy;Electrical Stimulation;Iontophoresis 4mg /ml Dexamethasone;Moist Heat;Ultrasound;Neuromuscular re-education;Therapeutic exercise;Therapeutic activities;Patient/family education;Functional mobility training;Manual techniques;Passive range of motion;Vasopneumatic Device;Taping    PT Next Visit Plan  PROM to right shoulder, modalities for pain relief    PT Home Exercise Plan  see patient education section    Consulted and Agree with Plan of Care  Patient       Patient will benefit from skilled therapeutic intervention in order to improve the following deficits and impairments:  Decreased activity tolerance, Decreased strength, Decreased range of motion, Pain, Postural dysfunction, Impaired UE functional use  Visit Diagnosis: Acute pain of right shoulder  Abnormal posture     Problem List Patient Active Problem List   Diagnosis Date Noted  . Aortic atherosclerosis ([facility]) 03/01/2018  . Barrett's esophagus 01/03/2017  . Non-healing ulcer ([facility]) 10/13/2015  . Neoplasm of uncertain behavior of skin 10/08/2015  . Skin lesion 09/07/2015  . Hypokalemia 03/03/2014  . BPH (benign prostatic hyperplasia) 03/03/2014  . Chest pain   . Palpitations   . Dyspnea on exertion   . ETOH abuse   . PTSD (post-traumatic stress disorder) 10/02/2013  . Abnormal stress test 08/29/2012  . GERD (gastroesophageal reflux disease)   . HTN  (hypertension)   . Anxiety   . Depression   . Allergic rhinitis   . Diverticular disease   . Hemorrhoids   .  Hiatal hernia   . Gout   . MACROCYTIC ANEMIA 08/09/2010  . NONSPEC ELEVATION OF LEVELS OF TRANSAMINASE/LDH 08/09/2010  . NONSPECIFIC ABNORMAL FINDING IN STOOL CONTENTS 08/09/2010    Halimah Bewick, [place] MPT 02/07/2020, 12:05 PM  [facility] [address] [place], [place], [address] Phone: [phone]   Fax:  [phone]  Name: Joseph Larsen MRN: [id number] Date of Birth: 06-03-1946

## 2020-02-10 ENCOUNTER — Ambulatory Visit: Payer: Medicare Other | Attending: Specialist | Admitting: Physical Therapy

## 2020-02-10 ENCOUNTER — Other Ambulatory Visit: Payer: Self-pay

## 2020-02-10 DIAGNOSIS — M25511 Pain in right shoulder: Secondary | ICD-10-CM | POA: Diagnosis not present

## 2020-02-10 DIAGNOSIS — R293 Abnormal posture: Secondary | ICD-10-CM | POA: Diagnosis not present

## 2020-02-10 NOTE — Therapy (Signed)
[facility] [place], [place], [address] Phone: [phone]   Fax:  [phone]  Physical Therapy Treatment  Patient Details  Name: Joseph Larsen MRN: [id number] Date of Birth: October 09, 1946 Referring Provider (PT): Sydnee Cabal, MD   Encounter Date: 02/10/2020  PT End of Session - 02/10/20 0943    Visit Number  9    Number of Visits  12    Date for PT Re-Evaluation  02/25/20    Authorization Type  FOTO, Progress note every 10th visit, KX modifier at 15th visit    PT Start Time  0900    PT Stop Time  0952    PT Time Calculation (min)  52 min    Equipment Utilized During Treatment  Other (comment)    Activity Tolerance  Patient tolerated treatment well    Behavior During Therapy  [facility] for tasks assessed/performed       Past Medical History:  Diagnosis Date  . Abnormal ECG    a. 08/2012 Abnl ETT with ST changes in recovery;  b. 08/2012 Ex MV, ex time 10:00, EF 66%, no ischemia/infarct.  . Allergic rhinitis   . Anxiety   . Cataract   . Depression   . Diverticular disease   . Dyspnea on exertion   . ETOH abuse   . GERD (gastroesophageal reflux disease)   . Gout   . Hemorrhoids   . Hiatal hernia   . HTN (hypertension)   . Low testosterone   . MACROCYTIC ANEMIA   . NONSPEC ELEVATION OF LEVELS OF TRANSAMINASE/LDH   . Nonspecific abnormal finding in stool contents   . Palpitations   . PTSD (post-traumatic stress disorder)   . PTSD (post-traumatic stress disorder)    [name]    Past Surgical History:  Procedure Laterality Date  . CHOLECYSTECTOMY  2004  . KNEE SURGERY Bilateral   . SHOULDER ARTHROSCOPY Right 01/14/2020    There were no vitals filed for this visit.                    [facility] Adult PT Treatment/Exercise - 02/10/20 0001      Modalities   Modalities  Electrical Stimulation;Vasopneumatic      Acupuncturist Location  20    Electrical  Stimulation Action  IFC    Electrical Stimulation Parameters  80-150 Hz x 20 minutes.    Electrical Stimulation Goals  Pain      Vasopneumatic   Number Minutes Vasopneumatic   20 minutes    Vasopnuematic Location   --   Right shoulder.   Vasopneumatic Pressure  --   Pillow btw rt elbow and thorax.     Manual Therapy   Manual Therapy  Passive ROM    Passive ROM  PROM in supine to patient right shoulder x 24 minutes.               PT Short Term Goals - 01/21/20 1332      PT SHORT TERM GOAL #1   Title  STG=LTG        PT Long Term Goals - 01/21/20 0931      PT LONG TERM GOAL #1   Title  Patient will be independent with HEP and its progression    Time  4    Period  Weeks    Status  New      PT LONG TERM GOAL #2   Title  Patient will  demonstrate 140+ degrees of right shoulder flexion AROM to improve ability to perform overhead tasks.    Time  4    Period  Weeks    Status  New      PT LONG TERM GOAL #3   Title  Patient will demonstate 55+ degrees of right shoulder ER AROM to improve ability to don/doff apparel.    Time  4    Period  Weeks    Status  New      PT LONG TERM GOAL #4   Title  Patient will report ability to perform ADLs and home activities with right shoulder pain less than or equal to 4/10.    Time  4    Period  Weeks    Status  New      PT LONG TERM GOAL #5   Title  Patient will demonstrate 4/5 or greater right shoulder MMT in all planes to improve stability during functional tasks.    Time  4    Period  Weeks    Status  New            Plan - 02/10/20 ID:[id number]    Clinical Impression Statement  The patient did well today with right shoulder PROM.  Notable muscle guarding.    Personal Factors and Comorbidities  Comorbidity 2    Comorbidities  right shoulder scope 01/14/2020, HTN    Examination-Activity Limitations  Bathing;Locomotion Level;Hygiene/Grooming;Dressing    Examination-Participation Restrictions  Driving    Stability/Clinical  Decision Making  Stable/Uncomplicated    Rehab Potential  Good    PT Frequency  3x / week    PT Duration  4 weeks    PT Treatment/Interventions  ADLs/Self Care Home Management;Cryotherapy;Electrical Stimulation;Iontophoresis 4mg /ml Dexamethasone;Moist Heat;Ultrasound;Neuromuscular re-education;Therapeutic exercise;Therapeutic activities;Patient/family education;Functional mobility training;Manual techniques;Passive range of motion;Vasopneumatic Device;Taping    PT Next Visit Plan  PROM to right shoulder, modalities for pain relief    PT Home Exercise Plan  see patient education section    Consulted and Agree with Plan of Care  Patient       Patient will benefit from skilled therapeutic intervention in order to improve the following deficits and impairments:  Decreased activity tolerance, Decreased strength, Decreased range of motion, Pain, Postural dysfunction, Impaired UE functional use  Visit Diagnosis: Acute pain of right shoulder  Abnormal posture     Problem List Patient Active Problem List   Diagnosis Date Noted  . Aortic atherosclerosis ([place]) 03/01/2018  . Barrett's esophagus 01/03/2017  . Non-healing ulcer ([place]) 10/13/2015  . Neoplasm of uncertain behavior of skin 10/08/2015  . Skin lesion 09/07/2015  . Hypokalemia 03/03/2014  . BPH (benign prostatic hyperplasia) 03/03/2014  . Chest pain   . Palpitations   . Dyspnea on exertion   . ETOH abuse   . PTSD (post-traumatic stress disorder) 10/02/2013  . Abnormal stress test 08/29/2012  . GERD (gastroesophageal reflux disease)   . HTN (hypertension)   . Anxiety   . Depression   . Allergic rhinitis   . Diverticular disease   . Hemorrhoids   . Hiatal hernia   . Gout   . MACROCYTIC ANEMIA 08/09/2010  . NONSPEC ELEVATION OF LEVELS OF TRANSAMINASE/LDH 08/09/2010  . NONSPECIFIC ABNORMAL FINDING IN STOOL CONTENTS 08/09/2010    Viveca Beckstrom, [place] MPT 02/10/2020, 10:07 AM  [facility] [address] [place], [place], [address] Phone: [phone]   Fax:  [phone]  Name: SHARAN ASHMEAD MRN: [id number] Date of Birth: February 18, 1946

## 2020-02-12 ENCOUNTER — Ambulatory Visit: Payer: Medicare Other | Admitting: Physical Therapy

## 2020-02-12 ENCOUNTER — Other Ambulatory Visit: Payer: Self-pay

## 2020-02-12 DIAGNOSIS — R293 Abnormal posture: Secondary | ICD-10-CM

## 2020-02-12 DIAGNOSIS — M25511 Pain in right shoulder: Secondary | ICD-10-CM | POA: Diagnosis not present

## 2020-02-12 NOTE — Therapy (Addendum)
activity  tolerance, Decreased strength, Decreased range of motion, Pain, Postural dysfunction, Impaired UE functional use  Visit Diagnosis: Acute pain of right shoulder  Abnormal posture     Problem List Patient Active Problem List   Diagnosis Date Noted  . Aortic atherosclerosis ([place]) 03/01/2018  . Barrett's esophagus 01/03/2017  . Non-healing ulcer ([place]) 10/13/2015  . Neoplasm of uncertain behavior of skin 10/08/2015  . Skin lesion 09/07/2015  . Hypokalemia 03/03/2014  . BPH (benign prostatic hyperplasia) 03/03/2014  . Chest pain   . Palpitations   . Dyspnea on exertion   . ETOH abuse   . PTSD (post-traumatic stress disorder) 10/02/2013  . Abnormal stress test 08/29/2012  . GERD (gastroesophageal reflux disease)   . HTN (hypertension)   . Anxiety   . Depression   . Allergic rhinitis   . Diverticular disease   . Hemorrhoids   . Hiatal hernia   . Gout   . MACROCYTIC ANEMIA 08/09/2010  . NONSPEC ELEVATION OF LEVELS OF TRANSAMINASE/LDH 08/09/2010  . NONSPECIFIC ABNORMAL FINDING IN STOOL CONTENTS 08/09/2010    Progress Note Reporting Period 01/21/20 to 02/12/20  See note below for Objective Data and Assessment of Progress/Goals. Patient progressing well per protocol guidelines with a significant improvement in his right shoulder PROM.      Taron Conrey, [place] MPT 02/12/2020, 10:59 AM  [facility] [address] [place], [place], [address] Phone: [phone]   Fax:  [phone]  Name: KELSTON MASAR MRN: [id number] Date of Birth: 23-Nov-1946  activity  tolerance, Decreased strength, Decreased range of motion, Pain, Postural dysfunction, Impaired UE functional use  Visit Diagnosis: Acute pain of right shoulder  Abnormal posture     Problem List Patient Active Problem List   Diagnosis Date Noted  . Aortic atherosclerosis ([place]) 03/01/2018  . Barrett's esophagus 01/03/2017  . Non-healing ulcer ([place]) 10/13/2015  . Neoplasm of uncertain behavior of skin 10/08/2015  . Skin lesion 09/07/2015  . Hypokalemia 03/03/2014  . BPH (benign prostatic hyperplasia) 03/03/2014  . Chest pain   . Palpitations   . Dyspnea on exertion   . ETOH abuse   . PTSD (post-traumatic stress disorder) 10/02/2013  . Abnormal stress test 08/29/2012  . GERD (gastroesophageal reflux disease)   . HTN (hypertension)   . Anxiety   . Depression   . Allergic rhinitis   . Diverticular disease   . Hemorrhoids   . Hiatal hernia   . Gout   . MACROCYTIC ANEMIA 08/09/2010  . NONSPEC ELEVATION OF LEVELS OF TRANSAMINASE/LDH 08/09/2010  . NONSPECIFIC ABNORMAL FINDING IN STOOL CONTENTS 08/09/2010    Progress Note Reporting Period 01/21/20 to 02/12/20  See note below for Objective Data and Assessment of Progress/Goals. Patient progressing well per protocol guidelines with a significant improvement in his right shoulder PROM.      Taron Conrey, [place] MPT 02/12/2020, 10:59 AM  [facility] [address] [place], [place], [address] Phone: [phone]   Fax:  [phone]  Name: KELSTON MASAR MRN: [id number] Date of Birth: 23-Nov-1946  [facility] [place], [place], [address] Phone: [phone]   Fax:  [phone]  Physical Therapy Treatment  Patient Details  Name: BAYLEY BEAMESDERFER MRN: [id number] Date of Birth: [birth year] Referring Provider (PT): Sydnee Cabal, MD   Encounter Date: 02/12/2020  PT End of Session - 02/12/20 1040    Visit Number  10    Number of Visits  12    Date for PT Re-Evaluation  02/25/20    Authorization Type  FOTO, Progress note every 10th visit, KX modifier at 15th visit    PT Start Time  0900    PT Stop Time  0959    PT Time Calculation (min)  59 min    Activity Tolerance  Patient tolerated treatment well    Behavior During Therapy  [facility] for tasks assessed/performed       Past Medical History:  Diagnosis Date  . Abnormal ECG    a. 08/2012 Abnl ETT with ST changes in recovery;  b. 08/2012 Ex MV, ex time 10:00, EF 66%, no ischemia/infarct.  . Allergic rhinitis   . Anxiety   . Cataract   . Depression   . Diverticular disease   . Dyspnea on exertion   . ETOH abuse   . GERD (gastroesophageal reflux disease)   . Gout   . Hemorrhoids   . Hiatal hernia   . HTN (hypertension)   . Low testosterone   . MACROCYTIC ANEMIA   . NONSPEC ELEVATION OF LEVELS OF TRANSAMINASE/LDH   . Nonspecific abnormal finding in stool contents   . Palpitations   . PTSD (post-traumatic stress disorder)   . PTSD (post-traumatic stress disorder)    [name]    Past Surgical History:  Procedure Laterality Date  . CHOLECYSTECTOMY  2004  . KNEE SURGERY Bilateral   . SHOULDER ARTHROSCOPY Right 01/14/2020    There were no vitals filed for this visit.  Subjective Assessment - 02/12/20 1032    Subjective  COVID-19 screen performed prior to patient entering clinic.  Patient doing exercises twice a day, told him to do them 4 times a day.    Patient is accompained by:  Family member    Limitations  Lifting;House hold activities    Patient Stated  Goals  use arm again for home activities and shop work.    Currently in Pain?  Yes    Pain Score  5     Pain Location  Shoulder    Pain Orientation  Right    Pain Descriptors / Indicators  Aching;Discomfort    Pain Type  Surgical pain    Pain Onset  1 to 4 weeks ago         [facility] PT Assessment - 02/12/20 0001      PROM   Right Shoulder Flexion  130 Degrees    Right Shoulder External Rotation  45 Degrees                   [facility] Adult PT Treatment/Exercise - 02/12/20 0001      Modalities   Modalities  Electrical Stimulation;Vasopneumatic      Electrical Stimulation   Electrical Stimulation Location  20    Electrical Stimulation Action  IFC    Electrical Stimulation Parameters  80-150 Hz x 20 minutes.    Electrical Stimulation Goals  Pain      Vasopneumatic   Number Minutes Vasopneumatic   20 minutes    Vasopnuematic Location   --

## 2020-02-14 ENCOUNTER — Other Ambulatory Visit: Payer: Self-pay

## 2020-02-14 ENCOUNTER — Encounter: Payer: Self-pay | Admitting: Physical Therapy

## 2020-02-14 ENCOUNTER — Ambulatory Visit: Payer: Medicare Other | Admitting: Physical Therapy

## 2020-02-14 DIAGNOSIS — Z23 Encounter for immunization: Secondary | ICD-10-CM | POA: Diagnosis not present

## 2020-02-14 DIAGNOSIS — R293 Abnormal posture: Secondary | ICD-10-CM

## 2020-02-14 DIAGNOSIS — M25511 Pain in right shoulder: Secondary | ICD-10-CM

## 2020-02-14 NOTE — Therapy (Signed)
HTN (hypertension)   . Anxiety   . Depression   . Allergic rhinitis   . Diverticular disease   . Hemorrhoids   . Hiatal hernia   . Gout   . MACROCYTIC ANEMIA 08/09/2010  . NONSPEC ELEVATION OF LEVELS OF TRANSAMINASE/LDH 08/09/2010  . NONSPECIFIC ABNORMAL FINDING IN STOOL CONTENTS 08/09/2010    Kynnadi Dicenso, Joseph Larsen 02/14/2020, 11:10 AM  [facility] [address] [place], [place], [address] Phone: [phone]   Fax:  [phone]  Name: Joseph Larsen MRN: [id number] Date of Birth: [birth year]  [facility] [place], [place], [address] Phone: [phone]   Fax:  [phone]  Physical Therapy Treatment  Patient Details  Name: Joseph Larsen MRN: [id number] Date of Birth: Sep 14, 1946 Referring Provider (PT): Sydnee Cabal, MD   Encounter Date: 02/14/2020  PT End of Session - 02/14/20 1101    Visit Number  11    Number of Visits  12    Date for PT Re-Evaluation  02/25/20    Authorization Type  FOTO, Progress note every 10th visit, KX modifier at 15th visit    PT Start Time  0900    PT Stop Time  0958    PT Time Calculation (min)  58 min    Activity Tolerance  Patient tolerated treatment well    Behavior During Therapy  [facility] for tasks assessed/performed       Past Medical History:  Diagnosis Date  . Abnormal ECG    a. 08/2012 Abnl ETT with ST changes in recovery;  b. 08/2012 Ex MV, ex time 10:00, EF 66%, no ischemia/infarct.  . Allergic rhinitis   . Anxiety   . Cataract   . Depression   . Diverticular disease   . Dyspnea on exertion   . ETOH abuse   . GERD (gastroesophageal reflux disease)   . Gout   . Hemorrhoids   . Hiatal hernia   . HTN (hypertension)   . Low testosterone   . MACROCYTIC ANEMIA   . NONSPEC ELEVATION OF LEVELS OF TRANSAMINASE/LDH   . Nonspecific abnormal finding in stool contents   . Palpitations   . PTSD (post-traumatic stress disorder)   . PTSD (post-traumatic stress disorder)    [name]    Past Surgical History:  Procedure Laterality Date  . CHOLECYSTECTOMY  2004  . KNEE SURGERY Bilateral   . SHOULDER ARTHROSCOPY Right 01/14/2020    There were no vitals filed for this visit.  Subjective Assessment - 02/14/20 1022    Subjective  COVID-19 screen performed prior to patient entering clinic.  No new complaints.    Patient is accompained by:  Family member    Limitations  Lifting;House hold activities    Patient Stated Goals  use arm again for home activities and shop  work.    Currently in Pain?  Yes    Pain Score  5     Pain Location  Shoulder    Pain Orientation  Right    Pain Descriptors / Indicators  Aching;Discomfort    Pain Type  Surgical pain    Pain Onset  1 to 4 weeks ago                       [facility] Adult PT Treatment/Exercise - 02/14/20 0001      Modalities   Modalities  Electrical Stimulation;Vasopneumatic      Electrical Stimulation   Electrical Stimulation Location  15    Electrical Stimulation Action  IFC    Electrical Stimulation Parameters  80-150 Hz x 15 minutes.    Electrical Stimulation Goals  Pain      Vasopneumatic   Number Minutes Vasopneumatic   15 minutes    Vasopnuematic Location   --   RT shoulder.   Vasopneumatic Pressure  Low      Manual Therapy   Manual Therapy  Passive ROM    Passive ROM  In supine:  PROM into patient's right shoulder flexion, abduction, IR/ER x 30 minutes.  HTN (hypertension)   . Anxiety   . Depression   . Allergic rhinitis   . Diverticular disease   . Hemorrhoids   . Hiatal hernia   . Gout   . MACROCYTIC ANEMIA 08/09/2010  . NONSPEC ELEVATION OF LEVELS OF TRANSAMINASE/LDH 08/09/2010  . NONSPECIFIC ABNORMAL FINDING IN STOOL CONTENTS 08/09/2010    Kynnadi Dicenso, Joseph Larsen 02/14/2020, 11:10 AM  [facility] [address] [place], [place], [address] Phone: [phone]   Fax:  [phone]  Name: Joseph Larsen MRN: [id number] Date of Birth: [birth year]

## 2020-02-17 ENCOUNTER — Other Ambulatory Visit: Payer: Self-pay

## 2020-02-17 ENCOUNTER — Ambulatory Visit: Payer: Medicare Other | Admitting: Physical Therapy

## 2020-02-17 DIAGNOSIS — M25511 Pain in right shoulder: Secondary | ICD-10-CM | POA: Diagnosis not present

## 2020-02-17 DIAGNOSIS — R293 Abnormal posture: Secondary | ICD-10-CM | POA: Diagnosis not present

## 2020-02-17 NOTE — Therapy (Signed)
[facility] [place], [place], [address] Phone: [phone]   Fax:  [phone]  Physical Therapy Treatment  Patient Details  Name: Joseph Larsen MRN: [id number] Date of Birth: 07-10-1946 Referring Provider (PT): Sydnee Cabal, MD   Encounter Date: 02/17/2020  PT End of Session - 02/17/20 1048    Visit Number  12    Number of Visits  18    Date for PT Re-Evaluation  03/17/20    Authorization Type  FOTO, Progress note every 10th visit, KX modifier at 15th visit    PT Start Time  0901    PT Stop Time  0957    PT Time Calculation (min)  56 min    Equipment Utilized During Treatment  Other (comment)    Activity Tolerance  Patient tolerated treatment well    Behavior During Therapy  [facility] for tasks assessed/performed       Past Medical History:  Diagnosis Date  . Abnormal ECG    a. 08/2012 Abnl ETT with ST changes in recovery;  b. 08/2012 Ex MV, ex time 10:00, EF 66%, no ischemia/infarct.  . Allergic rhinitis   . Anxiety   . Cataract   . Depression   . Diverticular disease   . Dyspnea on exertion   . ETOH abuse   . GERD (gastroesophageal reflux disease)   . Gout   . Hemorrhoids   . Hiatal hernia   . HTN (hypertension)   . Low testosterone   . MACROCYTIC ANEMIA   . NONSPEC ELEVATION OF LEVELS OF TRANSAMINASE/LDH   . Nonspecific abnormal finding in stool contents   . Palpitations   . PTSD (post-traumatic stress disorder)   . PTSD (post-traumatic stress disorder)    [name]    Past Surgical History:  Procedure Laterality Date  . CHOLECYSTECTOMY  2004  . KNEE SURGERY Bilateral   . SHOULDER ARTHROSCOPY Right 01/14/2020    There were no vitals filed for this visit.  Subjective Assessment - 02/17/20 1040    Subjective  COVID-19 screen performed prior to patient entering clinic. Doing those home exercises.    Patient is accompained by:  Family member    Limitations  Lifting;House hold activities    Patient Stated Goals  use arm again for home activities and shop work.    Currently in Pain?  Yes    Pain Score  5     Pain Location  Shoulder    Pain Orientation  Right    Pain Descriptors / Indicators  Aching;Discomfort    Pain Type  Surgical pain    Pain Onset  1 to 4 weeks ago                       [facility] Adult PT Treatment/Exercise - 02/17/20 0001      Modalities   Modalities  Electrical Stimulation;Vasopneumatic      Electrical Stimulation   Electrical Stimulation Location  20    Electrical Stimulation Action  IFC    Electrical Stimulation Parameters  80-150 Hz x 20 minutes.      Vasopneumatic   Number Minutes Vasopneumatic   20 minutes    Vasopnuematic Location   --   RT SHLD.Marland KitchenMarland KitchenPillow bte thorax and elbow.   Vasopneumatic Pressure  Low      Manual Therapy   Manual Therapy  Passive ROM    Passive ROM  IN supine:  PROM to patient's right shoulder into flexion, ER,  [facility] [place], [place], [address] Phone: [phone]   Fax:  [phone]  Physical Therapy Treatment  Patient Details  Name: Joseph Larsen MRN: [id number] Date of Birth: 07-10-1946 Referring Provider (PT): Sydnee Cabal, MD   Encounter Date: 02/17/2020  PT End of Session - 02/17/20 1048    Visit Number  12    Number of Visits  18    Date for PT Re-Evaluation  03/17/20    Authorization Type  FOTO, Progress note every 10th visit, KX modifier at 15th visit    PT Start Time  0901    PT Stop Time  0957    PT Time Calculation (min)  56 min    Equipment Utilized During Treatment  Other (comment)    Activity Tolerance  Patient tolerated treatment well    Behavior During Therapy  [facility] for tasks assessed/performed       Past Medical History:  Diagnosis Date  . Abnormal ECG    a. 08/2012 Abnl ETT with ST changes in recovery;  b. 08/2012 Ex MV, ex time 10:00, EF 66%, no ischemia/infarct.  . Allergic rhinitis   . Anxiety   . Cataract   . Depression   . Diverticular disease   . Dyspnea on exertion   . ETOH abuse   . GERD (gastroesophageal reflux disease)   . Gout   . Hemorrhoids   . Hiatal hernia   . HTN (hypertension)   . Low testosterone   . MACROCYTIC ANEMIA   . NONSPEC ELEVATION OF LEVELS OF TRANSAMINASE/LDH   . Nonspecific abnormal finding in stool contents   . Palpitations   . PTSD (post-traumatic stress disorder)   . PTSD (post-traumatic stress disorder)    [name]    Past Surgical History:  Procedure Laterality Date  . CHOLECYSTECTOMY  2004  . KNEE SURGERY Bilateral   . SHOULDER ARTHROSCOPY Right 01/14/2020    There were no vitals filed for this visit.  Subjective Assessment - 02/17/20 1040    Subjective  COVID-19 screen performed prior to patient entering clinic. Doing those home exercises.    Patient is accompained by:  Family member    Limitations  Lifting;House hold activities    Patient Stated Goals  use arm again for home activities and shop work.    Currently in Pain?  Yes    Pain Score  5     Pain Location  Shoulder    Pain Orientation  Right    Pain Descriptors / Indicators  Aching;Discomfort    Pain Type  Surgical pain    Pain Onset  1 to 4 weeks ago                       [facility] Adult PT Treatment/Exercise - 02/17/20 0001      Modalities   Modalities  Electrical Stimulation;Vasopneumatic      Electrical Stimulation   Electrical Stimulation Location  20    Electrical Stimulation Action  IFC    Electrical Stimulation Parameters  80-150 Hz x 20 minutes.      Vasopneumatic   Number Minutes Vasopneumatic   20 minutes    Vasopnuematic Location   --   RT SHLD.Marland KitchenMarland KitchenPillow bte thorax and elbow.   Vasopneumatic Pressure  Low      Manual Therapy   Manual Therapy  Passive ROM    Passive ROM  IN supine:  PROM to patient's right shoulder into flexion, ER,  hyperplasia) 03/03/2014  . Chest pain   . Palpitations   . Dyspnea on exertion   . ETOH abuse   . PTSD (post-traumatic stress disorder) 10/02/2013  . Abnormal stress test 08/29/2012  . GERD (gastroesophageal reflux disease)   . HTN (hypertension)   . Anxiety   . Depression   . Allergic rhinitis   . Diverticular disease   . Hemorrhoids   . Hiatal hernia   . Gout   . MACROCYTIC ANEMIA 08/09/2010  . NONSPEC ELEVATION OF LEVELS OF TRANSAMINASE/LDH 08/09/2010  . NONSPECIFIC ABNORMAL FINDING IN STOOL CONTENTS 08/09/2010    Kiowa Peifer, [place] MPT 02/17/2020, 10:55 AM  [facility] [address] [place], [place], [address] Phone: [phone]   Fax:  [phone]  Name: Joseph Larsen MRN: [id number] Date of Birth: Oct 24, 1946

## 2020-02-19 ENCOUNTER — Other Ambulatory Visit: Payer: Self-pay

## 2020-02-19 ENCOUNTER — Ambulatory Visit: Payer: Medicare Other | Admitting: Physical Therapy

## 2020-02-19 ENCOUNTER — Encounter: Payer: Self-pay | Admitting: Physical Therapy

## 2020-02-19 DIAGNOSIS — M25511 Pain in right shoulder: Secondary | ICD-10-CM

## 2020-02-19 DIAGNOSIS — R293 Abnormal posture: Secondary | ICD-10-CM | POA: Diagnosis not present

## 2020-02-19 NOTE — Therapy (Signed)
[facility] [place], [place], [address] Phone: [phone]   Fax:  [phone]  Physical Therapy Treatment  Patient Details  Name: Joseph Larsen MRN: [id number] Date of Birth: 10-21-1946 Referring Provider (PT): Sydnee Cabal, MD   Encounter Date: 02/19/2020  PT End of Session - 02/19/20 0942    Visit Number  13    Number of Visits  18    Date for PT Re-Evaluation  03/17/20    Authorization Type  FOTO, Progress note every 10th visit, KX modifier at 15th visit    PT Start Time  0900    PT Stop Time  0958    PT Time Calculation (min)  58 min    Equipment Utilized During Treatment  Other (comment)   right sling   Activity Tolerance  Patient tolerated treatment well    Behavior During Therapy  [facility] for tasks assessed/performed       Past Medical History:  Diagnosis Date  . Abnormal ECG    a. 08/2012 Abnl ETT with ST changes in recovery;  b. 08/2012 Ex MV, ex time 10:00, EF 66%, no ischemia/infarct.  . Allergic rhinitis   . Anxiety   . Cataract   . Depression   . Diverticular disease   . Dyspnea on exertion   . ETOH abuse   . GERD (gastroesophageal reflux disease)   . Gout   . Hemorrhoids   . Hiatal hernia   . HTN (hypertension)   . Low testosterone   . MACROCYTIC ANEMIA   . NONSPEC ELEVATION OF LEVELS OF TRANSAMINASE/LDH   . Nonspecific abnormal finding in stool contents   . Palpitations   . PTSD (post-traumatic stress disorder)   . PTSD (post-traumatic stress disorder)    [name]    Past Surgical History:  Procedure Laterality Date  . CHOLECYSTECTOMY  2004  . KNEE SURGERY Bilateral   . SHOULDER ARTHROSCOPY Right 01/14/2020    There were no vitals filed for this visit.  Subjective Assessment - 02/19/20 0951    Subjective  COVID-19 screen performed prior to patient entering clinic. Reports doing well.    Patient is accompained by:  Family member    Limitations  Lifting;House hold activities     Patient Stated Goals  use arm again for home activities and shop work.    Currently in Pain?  No/denies         [facility] PT Assessment - 02/19/20 0001      Assessment   Medical Diagnosis  Encounter for other orthopedic aftercare    Referring Provider (PT)  Sydnee Cabal, MD    Onset Date/Surgical Date  01/14/20                   [facility] Adult PT Treatment/Exercise - 02/19/20 0001      Modalities   Modalities  Electrical Stimulation;Vasopneumatic      Electrical Stimulation   Electrical Stimulation Location  15    Electrical Stimulation Action  IFC    Electrical Stimulation Parameters  80-150 hz x15 mins    Electrical Stimulation Goals  Pain      Vasopneumatic   Number Minutes Vasopneumatic   15 minutes    Vasopnuematic Location   Shoulder    Vasopneumatic Pressure  Low    Vasopneumatic Temperature   34      Manual Therapy   Manual Therapy  Passive ROM    Passive ROM  PROM to patient's right shoulder into  ER  with 10" hold and intermittent oscillations to reduce guarding                PT Short Term Goals - 01/21/20 1332      PT SHORT TERM GOAL #1   Title  STG=LTG        PT Long Term Goals - 02/12/20 1055      PT LONG TERM GOAL #1   Title  Patient will be independent with HEP and its progression    Time  4    Period  Weeks    Status  On-going      PT LONG TERM GOAL #2   Title  Patient will demonstrate 140+ degrees of right shoulder flexion AROM to improve ability to perform overhead tasks.    Time  4    Period  Weeks    Status  On-going      PT LONG TERM GOAL #3   Title  Patient will demonstate 55+ degrees of right shoulder ER AROM to improve ability to don/doff apparel.    Time  4    Period  Weeks    Status  On-going      PT LONG TERM GOAL #4   Title  Patient will report ability to perform ADLs and home activities with right shoulder pain less than or equal to 4/10.    Time  4    Period  Weeks    Status  On-going      PT LONG  TERM GOAL #5   Title  Patient will demonstrate 4/5 or greater right shoulder MMT in all planes to improve stability during functional tasks.    Time  4    Period  Weeks    Status  On-going            Plan - 02/19/20 1001    Clinical Impression Statement  Patient responded well to therapy session but with increase discomfort with end range right shoulder ER PROM. Patient provided with intermittment oscillations to prevent muscle guarding. No adverse affects upon removal of modalities.    Personal Factors and Comorbidities  Comorbidity 2    Comorbidities  right shoulder scope 01/14/2020, HTN    Examination-Activity Limitations  Bathing;Locomotion Level;Hygiene/Grooming;Dressing    Examination-Participation Restrictions  Driving    Stability/Clinical Decision Making  Stable/Uncomplicated    Clinical Decision Making  Low    Rehab Potential  Good    PT Frequency  3x / week    PT Duration  4 weeks    PT Treatment/Interventions  ADLs/Self Care Home Management;Cryotherapy;Electrical Stimulation;Iontophoresis 4mg /ml Dexamethasone;Moist Heat;Ultrasound;Neuromuscular re-education;Therapeutic exercise;Therapeutic activities;Patient/family education;Functional mobility training;Manual techniques;Passive range of motion;Vasopneumatic Device;Taping    PT Next Visit Plan  PROM to right shoulder, modalities for pain relief    PT Home Exercise Plan  see patient education section    Consulted and Agree with Plan of Care  Patient       Patient will benefit from skilled therapeutic intervention in order to improve the following deficits and impairments:  Decreased activity tolerance, Decreased strength, Decreased range of motion, Pain, Postural dysfunction, Impaired UE functional use  Visit Diagnosis: Acute pain of right shoulder  Abnormal posture     Problem List Patient Active Problem List   Diagnosis Date Noted  . Aortic atherosclerosis ([place]) 03/01/2018  . Barrett's esophagus 01/03/2017  .  Non-healing ulcer ([place]) 10/13/2015  . Neoplasm of uncertain behavior of skin 10/08/2015  . Skin lesion 09/07/2015  . Hypokalemia 03/03/2014  .  BPH (benign prostatic hyperplasia) 03/03/2014  . Chest pain   . Palpitations   . Dyspnea on exertion   . ETOH abuse   . PTSD (post-traumatic stress disorder) 10/02/2013  . Abnormal stress test 08/29/2012  . GERD (gastroesophageal reflux disease)   . HTN (hypertension)   . Anxiety   . Depression   . Allergic rhinitis   . Diverticular disease   . Hemorrhoids   . Hiatal hernia   . Gout   . MACROCYTIC ANEMIA 08/09/2010  . NONSPEC ELEVATION OF LEVELS OF TRANSAMINASE/LDH 08/09/2010  . NONSPECIFIC ABNORMAL FINDING IN STOOL CONTENTS 08/09/2010    Gabriela Eves, PT, DPT 02/19/2020, 10:11 AM  [facility] [address] [place], [place], [address] Phone: [phone]   Fax:  [phone]  Name: Joseph Larsen MRN: [id number] Date of Birth: Oct 28, 1946

## 2020-02-21 ENCOUNTER — Other Ambulatory Visit: Payer: Self-pay

## 2020-02-21 ENCOUNTER — Ambulatory Visit: Payer: Medicare Other | Admitting: Physical Therapy

## 2020-02-21 DIAGNOSIS — M25511 Pain in right shoulder: Secondary | ICD-10-CM | POA: Diagnosis not present

## 2020-02-21 DIAGNOSIS — R293 Abnormal posture: Secondary | ICD-10-CM

## 2020-02-21 NOTE — Therapy (Signed)
disorder) 10/02/2013  . Abnormal stress test 08/29/2012  . GERD (gastroesophageal reflux disease)   . HTN (hypertension)   . Anxiety   . Depression   . Allergic rhinitis   . Diverticular disease   . Hemorrhoids   . Hiatal hernia   . Gout   . MACROCYTIC ANEMIA 08/09/2010  . NONSPEC ELEVATION OF LEVELS OF TRANSAMINASE/LDH 08/09/2010  . NONSPECIFIC ABNORMAL FINDING IN STOOL CONTENTS 08/09/2010    Alajia Schmelzer, [place] MPT 02/21/2020, 10:10 AM  [facility] [address] [place], [place], [address] Phone: [phone]   Fax:  [phone]  Name: Joseph Larsen MRN: [id number] Date of Birth: 04-Sep-1946  [facility] [place], [place], [address] Phone: [phone]   Fax:  [phone]  Physical Therapy Treatment  Patient Details  Name: Joseph Larsen MRN: [id number] Date of Birth: 06-09-1946 Referring Provider (PT): Sydnee Cabal, MD   Encounter Date: 02/21/2020  PT End of Session - 02/21/20 0949    Visit Number  14    Number of Visits  18    Date for PT Re-Evaluation  03/17/20    Authorization Type  FOTO, Progress note every 10th visit, KX modifier at 15th visit    PT Start Time  0900    PT Stop Time  0957    PT Time Calculation (min)  57 min    Equipment Utilized During Treatment  Other (comment)    Activity Tolerance  Patient tolerated treatment well    Behavior During Therapy  [facility] for tasks assessed/performed       Past Medical History:  Diagnosis Date  . Abnormal ECG    a. 08/2012 Abnl ETT with ST changes in recovery;  b. 08/2012 Ex MV, ex time 10:00, EF 66%, no ischemia/infarct.  . Allergic rhinitis   . Anxiety   . Cataract   . Depression   . Diverticular disease   . Dyspnea on exertion   . ETOH abuse   . GERD (gastroesophageal reflux disease)   . Gout   . Hemorrhoids   . Hiatal hernia   . HTN (hypertension)   . Low testosterone   . MACROCYTIC ANEMIA   . NONSPEC ELEVATION OF LEVELS OF TRANSAMINASE/LDH   . Nonspecific abnormal finding in stool contents   . Palpitations   . PTSD (post-traumatic stress disorder)   . PTSD (post-traumatic stress disorder)    [name]    Past Surgical History:  Procedure Laterality Date  . CHOLECYSTECTOMY  2004  . KNEE SURGERY Bilateral   . SHOULDER ARTHROSCOPY Right 01/14/2020    There were no vitals filed for this visit.  Subjective Assessment - 02/21/20 0943    Subjective  COVID-19 screen performed prior to patient entering clinic.  Going to doctor next week.    Patient is accompained by:  Family member    Limitations  Lifting;House hold activities    Patient Stated Goals  use arm again for home activities and shop work.    Currently in Pain?  Yes    Pain Score  5     Pain Orientation  Right    Pain Descriptors / Indicators  Aching;Discomfort    Pain Type  Surgical pain    Pain Onset  1 to 4 weeks ago                       [facility] Adult PT Treatment/Exercise - 02/21/20 0001      Modalities   Modalities  Electrical Stimulation;Vasopneumatic      Electrical Stimulation   Electrical Stimulation Location  20    Electrical Stimulation Action  IFC    Electrical Stimulation Parameters  80-150 Hz x 20 minutes.    Electrical Stimulation Goals  Pain      Vasopneumatic   Number Minutes Vasopneumatic   20 minutes    Vasopnuematic Location   --   Right shoulder.   Vasopneumatic Pressure  Low      Manual Therapy   Manual Therapy  Passive ROM    Passive ROM  PROM to patient's right shoulder into flexion, ER, IR and abduction x  disorder) 10/02/2013  . Abnormal stress test 08/29/2012  . GERD (gastroesophageal reflux disease)   . HTN (hypertension)   . Anxiety   . Depression   . Allergic rhinitis   . Diverticular disease   . Hemorrhoids   . Hiatal hernia   . Gout   . MACROCYTIC ANEMIA 08/09/2010  . NONSPEC ELEVATION OF LEVELS OF TRANSAMINASE/LDH 08/09/2010  . NONSPECIFIC ABNORMAL FINDING IN STOOL CONTENTS 08/09/2010    Alajia Schmelzer, [place] MPT 02/21/2020, 10:10 AM  [facility] [address] [place], [place], [address] Phone: [phone]   Fax:  [phone]  Name: Joseph Larsen MRN: [id number] Date of Birth: 04-Sep-1946

## 2020-02-24 ENCOUNTER — Ambulatory Visit: Payer: Medicare Other | Admitting: Physical Therapy

## 2020-02-24 ENCOUNTER — Other Ambulatory Visit: Payer: Self-pay

## 2020-02-24 DIAGNOSIS — R293 Abnormal posture: Secondary | ICD-10-CM | POA: Diagnosis not present

## 2020-02-24 DIAGNOSIS — M25511 Pain in right shoulder: Secondary | ICD-10-CM | POA: Diagnosis not present

## 2020-02-24 NOTE — Therapy (Signed)
PTSD (post-traumatic stress disorder) 10/02/2013  . Abnormal stress test 08/29/2012  . GERD (gastroesophageal reflux disease)   . HTN (hypertension)   . Anxiety   . Depression   . Allergic rhinitis   . Diverticular disease   . Hemorrhoids   . Hiatal hernia   . Gout   . MACROCYTIC ANEMIA 08/09/2010  . NONSPEC ELEVATION OF LEVELS OF TRANSAMINASE/LDH 08/09/2010  . NONSPECIFIC ABNORMAL FINDING IN STOOL CONTENTS 08/09/2010    Jarmal Lewelling, [place] MPT 02/24/2020, 1:20 PM  [facility] [address] [place], [place], [address] Phone: [phone]   Fax:  [phone]  Name: Joseph Larsen MRN: [id number] Date of Birth: September 23, 1946  [facility] [place], [place], [address] Phone: [phone]   Fax:  [phone]  Physical Therapy Treatment  Patient Details  Name: Joseph Larsen MRN: [id number] Date of Birth: 06-12-1946 Referring Provider (PT): Sydnee Cabal, MD   Encounter Date: 02/24/2020  PT End of Session - 02/24/20 1318    Visit Number  15    Number of Visits  18    Date for PT Re-Evaluation  03/17/20    Authorization Type  FOTO, Progress note every 10th visit, KX modifier at 15th visit    PT Start Time  0945    PT Stop Time  1041    PT Time Calculation (min)  56 min    Activity Tolerance  Patient tolerated treatment well    Behavior During Therapy  [facility] for tasks assessed/performed       Past Medical History:  Diagnosis Date  . Abnormal ECG    a. 08/2012 Abnl ETT with ST changes in recovery;  b. 08/2012 Ex MV, ex time 10:00, EF 66%, no ischemia/infarct.  . Allergic rhinitis   . Anxiety   . Cataract   . Depression   . Diverticular disease   . Dyspnea on exertion   . ETOH abuse   . GERD (gastroesophageal reflux disease)   . Gout   . Hemorrhoids   . Hiatal hernia   . HTN (hypertension)   . Low testosterone   . MACROCYTIC ANEMIA   . NONSPEC ELEVATION OF LEVELS OF TRANSAMINASE/LDH   . Nonspecific abnormal finding in stool contents   . Palpitations   . PTSD (post-traumatic stress disorder)   . PTSD (post-traumatic stress disorder)    [name]    Past Surgical History:  Procedure Laterality Date  . CHOLECYSTECTOMY  2004  . KNEE SURGERY Bilateral   . SHOULDER ARTHROSCOPY Right 01/14/2020    There were no vitals filed for this visit.  Subjective Assessment - 02/24/20 1315    Subjective  COVID-19 screen performed prior to patient entering clinic.  No sling today.    Patient is accompained by:  Family member    Limitations  Lifting;House hold activities    Patient Stated Goals  use arm again for home activities and shop work.     Currently in Pain?  Yes    Pain Score  5     Pain Location  Shoulder    Pain Orientation  Right    Pain Descriptors / Indicators  Aching;Discomfort    Pain Type  Surgical pain    Pain Onset  More than a month ago                       [facility] Adult PT Treatment/Exercise - 02/24/20 0001      Modalities   Modalities  Electrical Stimulation;Vasopneumatic      Electrical Stimulation   Electrical Stimulation Location  20    Electrical Stimulation Action  IFC    Electrical Stimulation Parameters  80-150 Hz x 20 minutes.    Electrical Stimulation Goals  Pain      Vasopneumatic   Number Minutes Vasopneumatic   20 minutes    Vasopnuematic Location   --   Right shoulder.  Pillow btw elbow and thorax.   Vasopneumatic Pressure  Low      Manual Therapy   Manual Therapy  Passive ROM    Passive ROM  In supine:  PROM x 24 minutes to patient's right  PTSD (post-traumatic stress disorder) 10/02/2013  . Abnormal stress test 08/29/2012  . GERD (gastroesophageal reflux disease)   . HTN (hypertension)   . Anxiety   . Depression   . Allergic rhinitis   . Diverticular disease   . Hemorrhoids   . Hiatal hernia   . Gout   . MACROCYTIC ANEMIA 08/09/2010  . NONSPEC ELEVATION OF LEVELS OF TRANSAMINASE/LDH 08/09/2010  . NONSPECIFIC ABNORMAL FINDING IN STOOL CONTENTS 08/09/2010    Jarmal Lewelling, [place] MPT 02/24/2020, 1:20 PM  [facility] [address] [place], [place], [address] Phone: [phone]   Fax:  [phone]  Name: Joseph Larsen MRN: [id number] Date of Birth: September 23, 1946

## 2020-02-26 ENCOUNTER — Encounter: Payer: Self-pay | Admitting: Physical Therapy

## 2020-02-26 ENCOUNTER — Ambulatory Visit: Payer: Medicare Other | Admitting: Physical Therapy

## 2020-02-26 ENCOUNTER — Other Ambulatory Visit: Payer: Self-pay

## 2020-02-26 DIAGNOSIS — M25511 Pain in right shoulder: Secondary | ICD-10-CM

## 2020-02-26 DIAGNOSIS — R293 Abnormal posture: Secondary | ICD-10-CM

## 2020-02-26 NOTE — Therapy (Signed)
[facility] [place], [place], [address] Phone: [phone]   Fax:  [phone]  Physical Therapy Treatment  Patient Details  Name: Joseph Larsen MRN: [id number] Date of Birth: 10-11-1946 Referring Provider (PT): Sydnee Cabal, MD   Encounter Date: 02/26/2020  PT End of Session - 02/26/20 1302    Visit Number  16    Number of Visits  18    Date for PT Re-Evaluation  03/17/20    Authorization Type  FOTO, Progress note every 10th visit, KX modifier at 15th visit    PT Start Time  1304    PT Stop Time  1346    PT Time Calculation (min)  42 min    Activity Tolerance  Patient tolerated treatment well    Behavior During Therapy  [facility] for tasks assessed/performed       Past Medical History:  Diagnosis Date  . Abnormal ECG    a. 08/2012 Abnl ETT with ST changes in recovery;  b. 08/2012 Ex MV, ex time 10:00, EF 66%, no ischemia/infarct.  . Allergic rhinitis   . Anxiety   . Cataract   . Depression   . Diverticular disease   . Dyspnea on exertion   . ETOH abuse   . GERD (gastroesophageal reflux disease)   . Gout   . Hemorrhoids   . Hiatal hernia   . HTN (hypertension)   . Low testosterone   . MACROCYTIC ANEMIA   . NONSPEC ELEVATION OF LEVELS OF TRANSAMINASE/LDH   . Nonspecific abnormal finding in stool contents   . Palpitations   . PTSD (post-traumatic stress disorder)   . PTSD (post-traumatic stress disorder)    [name]    Past Surgical History:  Procedure Laterality Date  . CHOLECYSTECTOMY  2004  . KNEE SURGERY Bilateral   . SHOULDER ARTHROSCOPY Right 01/14/2020    There were no vitals filed for this visit.  Subjective Assessment - 02/26/20 1302    Subjective  COVID-19 screen performed prior to patient entering clinic.  No sling today but feeling better everyday. MD thinks he is doing better than he expected.    Limitations  Lifting;House hold activities    Patient Stated Goals  use arm again for  home activities and shop work.    Currently in Pain?  Other (Comment)   No pain assessment provided by patient        [facility] PT Assessment - 02/26/20 0001      Assessment   Medical Diagnosis  Encounter for other orthopedic aftercare    Referring Provider (PT)  Sydnee Cabal, MD    Onset Date/Surgical Date  01/14/20    Next MD Visit  03/18/2020      Precautions   Precautions  Shoulder    Type of Shoulder Precautions  R shoulder scope, possible RTC and biceps tenodesis                   [facility] Adult PT Treatment/Exercise - 02/26/20 0001      Shoulder Exercises: Supine   Protraction  AAROM;Both;20 reps    External Rotation  AAROM;Right;20 reps    Flexion  AAROM;Both;20 reps      Modalities   Modalities  Psychologist, educational Location  R shoulder    Electrical Stimulation Action  IFC    Electrical Stimulation Parameters  80-150 hz x15 min    Electrical Stimulation Goals  Pain  Vasopneumatic   Number Minutes Vasopneumatic   15 minutes    Vasopnuematic Location   Shoulder    Vasopneumatic Pressure  Low    Vasopneumatic Temperature   34      Manual Therapy   Manual Therapy  Passive ROM    Passive ROM  PROM of R shoulder into flexion, ER with holds at end range               PT Short Term Goals - 01/21/20 1332      PT SHORT TERM GOAL #1   Title  STG=LTG        PT Long Term Goals - 02/12/20 1055      PT LONG TERM GOAL #1   Title  Patient will be independent with HEP and its progression    Time  4    Period  Weeks    Status  On-going      PT LONG TERM GOAL #2   Title  Patient will demonstrate 140+ degrees of right shoulder flexion AROM to improve ability to perform overhead tasks.    Time  4    Period  Weeks    Status  On-going      PT LONG TERM GOAL #3   Title  Patient will demonstate 55+ degrees of right shoulder ER AROM to improve ability to don/doff apparel.     Time  4    Period  Weeks    Status  On-going      PT LONG TERM GOAL #4   Title  Patient will report ability to perform ADLs and home activities with right shoulder pain less than or equal to 4/10.    Time  4    Period  Weeks    Status  On-going      PT LONG TERM GOAL #5   Title  Patient will demonstrate 4/5 or greater right shoulder MMT in all planes to improve stability during functional tasks.    Time  4    Period  Weeks    Status  On-going            Plan - 02/26/20 1419    Clinical Impression Statement  Patient presented in clinic with no reports of R shoulder pain. Patient progressed to light AAROM exercises today with no complaints of pain. Patient did report discomfort at end range flexion with overpressure. Firm end feels and smooth arc of motion noted during PROM of R shoulder in all directions. Normal modalities response noted following removal of the modalities.    Personal Factors and Comorbidities  Comorbidity 2    Comorbidities  right shoulder scope 01/14/2020, HTN    Examination-Activity Limitations  Bathing;Locomotion Level;Hygiene/Grooming;Dressing    Examination-Participation Restrictions  Driving    Stability/Clinical Decision Making  Stable/Uncomplicated    Rehab Potential  Good    PT Frequency  3x / week    PT Duration  4 weeks    PT Treatment/Interventions  ADLs/Self Care Home Management;Cryotherapy;Electrical Stimulation;Iontophoresis 4mg /ml Dexamethasone;Moist Heat;Ultrasound;Neuromuscular re-education;Therapeutic exercise;Therapeutic activities;Patient/family education;Functional mobility training;Manual techniques;Passive range of motion;Vasopneumatic Device;Taping    PT Next Visit Plan  Progress to pulleys and AAROM exercises per protocol.    PT Home Exercise Plan  see patient education section    Consulted and Agree with Plan of Care  Patient       Patient will benefit from skilled therapeutic intervention in order to improve the following deficits  and impairments:  Decreased activity tolerance, Decreased strength, Decreased range  of motion, Pain, Postural dysfunction, Impaired UE functional use  Visit Diagnosis: Acute pain of right shoulder  Abnormal posture     Problem List Patient Active Problem List   Diagnosis Date Noted  . Aortic atherosclerosis ([place]) 03/01/2018  . Barrett's esophagus 01/03/2017  . Non-healing ulcer ([place]) 10/13/2015  . Neoplasm of uncertain behavior of skin 10/08/2015  . Skin lesion 09/07/2015  . Hypokalemia 03/03/2014  . BPH (benign prostatic hyperplasia) 03/03/2014  . Chest pain   . Palpitations   . Dyspnea on exertion   . ETOH abuse   . PTSD (post-traumatic stress disorder) 10/02/2013  . Abnormal stress test 08/29/2012  . GERD (gastroesophageal reflux disease)   . HTN (hypertension)   . Anxiety   . Depression   . Allergic rhinitis   . Diverticular disease   . Hemorrhoids   . Hiatal hernia   . Gout   . MACROCYTIC ANEMIA 08/09/2010  . NONSPEC ELEVATION OF LEVELS OF TRANSAMINASE/LDH 08/09/2010  . NONSPECIFIC ABNORMAL FINDING IN STOOL CONTENTS 08/09/2010    Standley Brooking, PTA 02/26/2020, 2:27 PM  [facility] [address] [place], [place], [address] Phone: [phone]   Fax:  [phone]  Name: Joseph Larsen MRN: [id number] Date of Birth: January 26, 1946

## 2020-02-27 ENCOUNTER — Telehealth: Payer: Self-pay | Admitting: *Deleted

## 2020-02-27 DIAGNOSIS — Z01810 Encounter for preprocedural cardiovascular examination: Secondary | ICD-10-CM

## 2020-02-27 NOTE — Telephone Encounter (Signed)
Joseph Larsen with Dr. Samuel Jester office states she is returning [name]'s call to make her aware that propofol anesthesia will be used for the procedure. She states she would also like to inquire about how long apixaban (ELIQUIS) 5 MG TABS tablet medication may be held. Please return call to discuss at [phone].

## 2020-02-27 NOTE — Telephone Encounter (Signed)
Need BMET results before we can provide input on anticoag length of hold.

## 2020-02-27 NOTE — Telephone Encounter (Signed)
Patient with diagnosis of atrial fibrillation on Eliquis for anticoagulation.    Procedure: EGD with Biopsy Date of procedure: TBD  CHADS2-VASc score of  3 (HTN, AGE, CAD)  Patient has not had BMET and CBC checked since 2019. Will need to recheck BMET and CBC.  If Scr is normal on BMET, OK to hold Eliquis for 2 days prior to procedure.

## 2020-02-27 NOTE — Telephone Encounter (Signed)
I s/w pt and asked if he has any lab work done with PCP or GI in the last 6 months; Pt states he has not. I informed the pt that he will then need to come into our office for lab work (BMET, CBC). Pt agreeable to plan of care and will come in 03/03/20. Pt aware he can come in anywhere between 7:30-4:30 for lab appt. I will let pre op team know pt coming in for labs on 03/03/20.

## 2020-02-27 NOTE — Telephone Encounter (Signed)
Pt is coming in for labs on 03/03/20

## 2020-02-27 NOTE — Telephone Encounter (Signed)
   [facility] Pre-operative Risk Assessment    Request for surgical clearance:  1. What type of surgery is being performed? EGD w/BX   2. When is this surgery scheduled? TBD   3. What type of clearance is required (medical clearance vs. Pharmacy clearance to hold med vs. Both)? BOTH  4. Are there any medications that need to be held prior to surgery and how long? ELIQUIS   5. Practice name and name of physician performing surgery? [facility]; DR. Adria Devon   6. What is your office phone number [phone]    7.   What is your office fax number [phone]  8.   Anesthesia type (None, local, MAC, general) ? NOT LISTED; LEFT MESSAGE TO CALL BACK WITH ANESTHESIA BEING USED IF ANY.    Julaine Hua 02/27/2020, 12:12 PM  _________________________________________________________________   (provider comments below)

## 2020-02-28 ENCOUNTER — Other Ambulatory Visit: Payer: Self-pay

## 2020-02-28 ENCOUNTER — Ambulatory Visit: Payer: Medicare Other | Admitting: *Deleted

## 2020-02-28 DIAGNOSIS — R293 Abnormal posture: Secondary | ICD-10-CM

## 2020-02-28 DIAGNOSIS — M25511 Pain in right shoulder: Secondary | ICD-10-CM

## 2020-02-28 NOTE — Therapy (Signed)
[facility] [place], [place], [address] Phone: [phone]   Fax:  [phone]  Physical Therapy Treatment  Patient Details  Name: Joseph Larsen MRN: [id number] Date of Birth: 12-Oct-1946 Referring Provider (PT): Sydnee Cabal, MD   Encounter Date: 02/28/2020  PT End of Session - 02/28/20 0919    Visit Number  17    Number of Visits  26    Date for PT Re-Evaluation  04/03/20    Authorization Type  FOTO, Progress note every 10th visit, KX modifier at 15th visit    PT Start Time  0900    PT Stop Time  0957    PT Time Calculation (min)  57 min       Past Medical History:  Diagnosis Date  . Abnormal ECG    a. 08/2012 Abnl ETT with ST changes in recovery;  b. 08/2012 Ex MV, ex time 10:00, EF 66%, no ischemia/infarct.  . Allergic rhinitis   . Anxiety   . Cataract   . Depression   . Diverticular disease   . Dyspnea on exertion   . ETOH abuse   . GERD (gastroesophageal reflux disease)   . Gout   . Hemorrhoids   . Hiatal hernia   . HTN (hypertension)   . Low testosterone   . MACROCYTIC ANEMIA   . NONSPEC ELEVATION OF LEVELS OF TRANSAMINASE/LDH   . Nonspecific abnormal finding in stool contents   . Palpitations   . PTSD (post-traumatic stress disorder)   . PTSD (post-traumatic stress disorder)    [name]    Past Surgical History:  Procedure Laterality Date  . CHOLECYSTECTOMY  2004  . KNEE SURGERY Bilateral   . SHOULDER ARTHROSCOPY Right 01/14/2020    There were no vitals filed for this visit.  Subjective Assessment - 02/28/20 0918    Subjective  COVID-19 screen performed prior to patient entering clinic.  No sling today but feeling better everyday.  RT shldr is sore today    Patient is accompained by:  Family member    Limitations  Lifting;House hold activities    Patient Stated Goals  use arm again for home activities and shop work.    Currently in Pain?  Yes    Pain Score  5     Pain Location   Shoulder    Pain Orientation  Right    Pain Descriptors / Indicators  Aching;Discomfort    Pain Type  Surgical pain    Pain Onset  More than a month ago                       [facility] Adult PT Treatment/Exercise - 02/28/20 0001      Shoulder Exercises: Supine   Protraction  AAROM;Both;20 reps    External Rotation  AAROM;Right;20 reps    Flexion  AAROM;Both;20 reps      Shoulder Exercises: Pulleys   Flexion  5 minutes      Modalities   Modalities  Electrical Stimulation;Vasopneumatic      Electrical Stimulation   Electrical Stimulation Location  R shoulder    Electrical Stimulation Action  IFC    Electrical Stimulation Parameters  80-150hz  x 15 mins    Electrical Stimulation Goals  Pain      Vasopneumatic   Number Minutes Vasopneumatic   15 minutes    Vasopnuematic Location   Shoulder    Vasopneumatic Pressure  Low    Vasopneumatic Temperature  Barrett's esophagus 01/03/2017  . Non-healing ulcer  ([place]) 10/13/2015  . Neoplasm of uncertain behavior of skin 10/08/2015  . Skin lesion 09/07/2015  . Hypokalemia 03/03/2014  . BPH (benign prostatic hyperplasia) 03/03/2014  . Chest pain   . Palpitations   . Dyspnea on exertion   . ETOH abuse   . PTSD (post-traumatic stress disorder) 10/02/2013  . Abnormal stress test 08/29/2012  . GERD (gastroesophageal reflux disease)   . HTN (hypertension)   . Anxiety   . Depression   . Allergic rhinitis   . Diverticular disease   . Hemorrhoids   . Hiatal hernia   . Gout   . MACROCYTIC ANEMIA 08/09/2010  . NONSPEC ELEVATION OF LEVELS OF TRANSAMINASE/LDH 08/09/2010  . NONSPECIFIC ABNORMAL FINDING IN STOOL CONTENTS 08/09/2010   Gerald Stabs Montie Swiderski PTA [place] MPT  [facility] [address] [place], [place], [address] Phone: [phone]   Fax:  [phone]  Name: Joseph Larsen MRN: [id number] Date of Birth: Aug 10, 1946  Barrett's esophagus 01/03/2017  . Non-healing ulcer  ([place]) 10/13/2015  . Neoplasm of uncertain behavior of skin 10/08/2015  . Skin lesion 09/07/2015  . Hypokalemia 03/03/2014  . BPH (benign prostatic hyperplasia) 03/03/2014  . Chest pain   . Palpitations   . Dyspnea on exertion   . ETOH abuse   . PTSD (post-traumatic stress disorder) 10/02/2013  . Abnormal stress test 08/29/2012  . GERD (gastroesophageal reflux disease)   . HTN (hypertension)   . Anxiety   . Depression   . Allergic rhinitis   . Diverticular disease   . Hemorrhoids   . Hiatal hernia   . Gout   . MACROCYTIC ANEMIA 08/09/2010  . NONSPEC ELEVATION OF LEVELS OF TRANSAMINASE/LDH 08/09/2010  . NONSPECIFIC ABNORMAL FINDING IN STOOL CONTENTS 08/09/2010   Gerald Stabs Montie Swiderski PTA [place] MPT  [facility] [address] [place], [place], [address] Phone: [phone]   Fax:  [phone]  Name: Joseph Larsen MRN: [id number] Date of Birth: Aug 10, 1946

## 2020-03-02 ENCOUNTER — Other Ambulatory Visit: Payer: Self-pay

## 2020-03-02 ENCOUNTER — Ambulatory Visit: Payer: Medicare Other | Admitting: Physical Therapy

## 2020-03-02 DIAGNOSIS — R293 Abnormal posture: Secondary | ICD-10-CM | POA: Diagnosis not present

## 2020-03-02 DIAGNOSIS — M25511 Pain in right shoulder: Secondary | ICD-10-CM

## 2020-03-02 NOTE — Therapy (Signed)
[facility] [address] [place], [place], [address] Phone: [phone]   Fax:  [phone]  Physical Therapy Treatment  Patient Details  Name: Joseph Larsen MRN: [id number] Date of Birth: 1946-10-01 Referring Provider (PT): Eugenia Mcalpine, MD   Encounter Date: 03/02/2020  PT End of Session - 03/02/20 1447    Visit Number  18    Number of Visits  26    Date for PT Re-Evaluation  04/03/20    Authorization Type  FOTO, Progress note every 10th visit, KX modifier at 15th visit    PT Start Time  0145    PT Stop Time  0234    PT Time Calculation (min)  49 min    Activity Tolerance  Patient tolerated treatment well    Behavior During Therapy  [facility] for tasks assessed/performed       Past Medical History:  Diagnosis Date  . Abnormal ECG    a. 08/2012 Abnl ETT with ST changes in recovery;  b. 08/2012 Ex MV, ex time 10:00, EF 66%, no ischemia/infarct.  . Allergic rhinitis   . Anxiety   . Cataract   . Depression   . Diverticular disease   . Dyspnea on exertion   . ETOH abuse   . GERD (gastroesophageal reflux disease)   . Gout   . Hemorrhoids   . Hiatal hernia   . HTN (hypertension)   . Low testosterone   . MACROCYTIC ANEMIA   . NONSPEC ELEVATION OF LEVELS OF TRANSAMINASE/LDH   . Nonspecific abnormal finding in stool contents   . Palpitations   . PTSD (post-traumatic stress disorder)   . PTSD (post-traumatic stress disorder)    [place] [employer] - [employer]    Past Surgical History:  Procedure Laterality Date  . CHOLECYSTECTOMY  2004  . KNEE SURGERY Bilateral   . SHOULDER ARTHROSCOPY Right 01/14/2020    There were no vitals filed for this visit.  Subjective Assessment - 03/02/20 1430    Subjective  COVID-19 screen performed prior to patient entering clinic.  Doing the ball squeeze so much I hurt my shoulder.    Patient is accompained by:  Family member    Limitations  Lifting;House hold activities    Patient Stated Goals  use arm again  for home activities and shop work.    Currently in Pain?  Yes    Pain Score  6     Pain Location  Shoulder    Pain Orientation  Right    Pain Descriptors / Indicators  Aching;Discomfort    Pain Onset  More than a month ago                       [facility] Adult PT Treatment/Exercise - 03/02/20 0001      Exercises   Exercises  Shoulder      Shoulder Exercises: Pulleys   Flexion  5 minutes    Other Pulley Exercises  UE ranger on wall x 4 minutes into flexion and circle (CCW and CW).      Modalities   Modalities  Programmer, applications Location  RT shoulder.    Electrical Stimulation Action  IFC     Electrical Stimulation Parameters  80-150 Hz x 15 minutes.    Electrical Stimulation Goals  Pain      Vasopneumatic   Number Minutes Vasopneumatic   15 minutes    Vasopnuematic Location   --  Right shoulder.   Vasopneumatic Pressure  Low    Vasopneumatic Temperature   34      Manual Therapy   Manual Therapy  Passive ROM    Passive ROM  In supine:  PROM into right shoulder flexion and ER  x 14 minutes.               PT Short Term Goals - 01/21/20 1332      PT SHORT TERM GOAL #1   Title  STG=LTG        PT Long Term Goals - 02/12/20 1055      PT LONG TERM GOAL #1   Title  Patient will be independent with HEP and its progression    Time  4    Period  Weeks    Status  On-going      PT LONG TERM GOAL #2   Title  Patient will demonstrate 140+ degrees of right shoulder flexion AROM to improve ability to perform overhead tasks.    Time  4    Period  Weeks    Status  On-going      PT LONG TERM GOAL #3   Title  Patient will demonstate 55+ degrees of right shoulder ER AROM to improve ability to don/doff apparel.    Time  4    Period  Weeks    Status  On-going      PT LONG TERM GOAL #4   Title  Patient will report ability to perform ADLs and home activities with right shoulder pain  less than or equal to 4/10.    Time  4    Period  Weeks    Status  On-going      PT LONG TERM GOAL #5   Title  Patient will demonstrate 4/5 or greater right shoulder MMT in all planes to improve stability during functional tasks.    Time  4    Period  Weeks    Status  On-going            Plan - 03/02/20 1441    Clinical Impression Statement  Patient doing well.  He states he was doing the ball squeeze so much to help with his hand numbness and weakness that his shoulder hurts more.  He did well with the addition of the UE Ranger on the wall today.    Personal Factors and Comorbidities  Comorbidity 2    Comorbidities  right shoulder scope 01/14/2020, HTN    Examination-Activity Limitations  Bathing;Locomotion Level;Hygiene/Grooming;Dressing    Examination-Participation Restrictions  Driving    Stability/Clinical Decision Making  Stable/Uncomplicated    Rehab Potential  Good    PT Frequency  3x / week    PT Duration  4 weeks    PT Treatment/Interventions  ADLs/Self Care Home Management;Cryotherapy;Electrical Stimulation;Iontophoresis 4mg /ml Dexamethasone;Moist Heat;Ultrasound;Neuromuscular re-education;Therapeutic exercise;Therapeutic activities;Patient/family education;Functional mobility training;Manual techniques;Passive range of motion;Vasopneumatic Device;Taping    PT Next Visit Plan  Progress to pulleys and AAROM exercises per protocol.   Recert to cont PT    PT Home Exercise Plan  see patient education section    Consulted and Agree with Plan of Care  Patient       Patient will benefit from skilled therapeutic intervention in order to improve the following deficits and impairments:  Decreased activity tolerance, Decreased strength, Decreased range of motion, Pain, Postural dysfunction, Impaired UE functional use  Visit Diagnosis: Acute pain of right shoulder  Abnormal posture     Problem List Patient  Active Problem List   Diagnosis Date Noted  . Aortic  atherosclerosis (HCC) 03/01/2018  . Barrett's esophagus 01/03/2017  . Non-healing ulcer (HCC) 10/13/2015  . Neoplasm of uncertain behavior of skin 10/08/2015  . Skin lesion 09/07/2015  . Hypokalemia 03/03/2014  . BPH (benign prostatic hyperplasia) 03/03/2014  . Chest pain   . Palpitations   . Dyspnea on exertion   . ETOH abuse   . PTSD (post-traumatic stress disorder) 10/02/2013  . Abnormal stress test 08/29/2012  . GERD (gastroesophageal reflux disease)   . HTN (hypertension)   . Anxiety   . Depression   . Allergic rhinitis   . Diverticular disease   . Hemorrhoids   . Hiatal hernia   . Gout   . MACROCYTIC ANEMIA 08/09/2010  . NONSPEC ELEVATION OF LEVELS OF TRANSAMINASE/LDH 08/09/2010  . NONSPECIFIC ABNORMAL FINDING IN STOOL CONTENTS 08/09/2010    Darian Ace, [place] MPT 03/02/2020, 2:48 PM  [facility] [address] [place], [place], [address] Phone: [phone]   Fax:  [phone]  Name: Joseph Larsen MRN: [id number] Date of Birth: 1946/06/08

## 2020-03-03 ENCOUNTER — Other Ambulatory Visit: Payer: Medicare Other

## 2020-03-03 DIAGNOSIS — Z01810 Encounter for preprocedural cardiovascular examination: Secondary | ICD-10-CM | POA: Diagnosis not present

## 2020-03-03 LAB — CBC
Hematocrit: 47.7 % (ref 37.5–51.0)
Hemoglobin: 16.8 g/dL (ref 13.0–17.7)
MCH: 33.7 pg — ABNORMAL HIGH (ref 26.6–33.0)
MCHC: 35.2 g/dL (ref 31.5–35.7)
MCV: 96 fL (ref 79–97)
Platelets: 182 10*3/uL (ref 150–450)
RBC: 4.98 x10E6/uL (ref 4.14–5.80)
RDW: 14.1 % (ref 11.6–15.4)
WBC: 6.2 10*3/uL (ref 3.4–10.8)

## 2020-03-03 LAB — BASIC METABOLIC PANEL
BUN/Creatinine Ratio: 12 (ref 10–24)
BUN: 9 mg/dL (ref 8–27)
CO2: 29 mmol/L (ref 20–29)
Calcium: 9.3 mg/dL (ref 8.6–10.2)
Chloride: 98 mmol/L (ref 96–106)
Creatinine, Ser: 0.77 mg/dL (ref 0.76–1.27)
GFR calc Af Amer: 105 mL/min/{1.73_m2} (ref >59)
GFR calc non Af Amer: 91 mL/min/{1.73_m2} (ref >59)
Glucose: 89 mg/dL (ref 65–99)
Potassium: 3.3 mmol/L — ABNORMAL LOW (ref 3.5–5.2)
Sodium: 142 mmol/L (ref 134–144)

## 2020-03-03 NOTE — Telephone Encounter (Signed)
I will send FYI to pre op team and pharm D pt had lab draw this morning. Awaiting on results for pre op clearance.

## 2020-03-04 ENCOUNTER — Telehealth: Payer: Self-pay | Admitting: General Practice

## 2020-03-04 ENCOUNTER — Other Ambulatory Visit: Payer: Self-pay

## 2020-03-04 ENCOUNTER — Ambulatory Visit: Payer: Medicare Other | Admitting: Physical Therapy

## 2020-03-04 ENCOUNTER — Encounter: Payer: Self-pay | Admitting: Physical Therapy

## 2020-03-04 DIAGNOSIS — R293 Abnormal posture: Secondary | ICD-10-CM | POA: Diagnosis not present

## 2020-03-04 DIAGNOSIS — M25511 Pain in right shoulder: Secondary | ICD-10-CM

## 2020-03-04 NOTE — Telephone Encounter (Signed)
   Primary Cardiologist: Jenkins Rouge, MD  Chart reviewed as part of pre-operative protocol coverage. Given past medical history and time since last visit, based on ACC/AHA guidelines, Joseph Larsen would be at acceptable risk for the planned procedure without further cardiovascular testing.   His RCRI risk is a class I risk, 0.4% risk of major cardiac event.  He is able to complete more than 4 METS of physical activity.  SCr 0.77, CrCl > 127mL/min.  He may  hold Eliquis 2 days prior to procedure.  Please restart Eliquis as soon as hemostasis is achieved.  I will route this recommendation to the requesting party via Epic fax function and remove from pre-op pool.  Please call with questions.  Jossie Ng. [facility] [place] [address] Office [phone] Fax [phone]

## 2020-03-04 NOTE — Telephone Encounter (Signed)
SCr 0.77, CrCl > 179mL/min. With normal renal function, ok to hold Eliquis 2 days prior to procedure.

## 2020-03-04 NOTE — Telephone Encounter (Signed)
Signed            Primary Cardiologist: Jenkins Rouge, MD  Chart reviewed as part of pre-operative protocol coverage. Given past medical history and time since last visit, based on ACC/AHA guidelines, Joseph Larsen would be at acceptable risk for the planned procedure without further cardiovascular testing.   His RCRI risk is a class I risk, 0.4% risk of major cardiac event.  He is able to complete more than 4 METS of physical activity.  SCr 0.77, CrCl > 165mL/min.  He may  hold Eliquis 2 days prior to procedure.  Please restart Eliquis as soon as hemostasis is achieved.  I will route this recommendation to the requesting party via Epic fax function and remove from pre-op pool.  Please call with questions.  Jossie Ng. [facility] [place] [address] Office [phone] Fax [phone]

## 2020-03-04 NOTE — Therapy (Signed)
hyperplasia)  03/03/2014  . Chest pain   . Palpitations   . Dyspnea on exertion   . ETOH abuse   . PTSD (post-traumatic stress disorder) 10/02/2013  . Abnormal stress test 08/29/2012  . GERD (gastroesophageal reflux disease)   . HTN (hypertension)   . Anxiety   . Depression   . Allergic rhinitis   . Diverticular disease   . Hemorrhoids   . Hiatal hernia   . Gout   . MACROCYTIC ANEMIA 08/09/2010  . NONSPEC ELEVATION OF LEVELS OF TRANSAMINASE/LDH 08/09/2010  . NONSPECIFIC ABNORMAL FINDING IN STOOL CONTENTS 08/09/2010    [name], [place] MPT 03/04/2020, 3:02 PM  [facility] [address] [place], [place], [address] Phone: [phone]   Fax:  [phone]  Name: Joseph Larsen MRN: [id number] Date of Birth: Oct 06, 1946  [facility] [place], [place], [address] Phone: [phone]   Fax:  [phone]  Physical Therapy Treatment  Patient Details  Name: Joseph Larsen MRN: [id number] Date of Birth: 1946/01/29 Referring Provider (PT): Sydnee Cabal, MD   Encounter Date: 03/04/2020  PT End of Session - 03/04/20 1458    Visit Number  19    Number of Visits  26    Date for PT Re-Evaluation  04/03/20    Authorization Type  FOTO, Progress note every 10th visit, KX modifier at 15th visit    PT Start Time  0900    PT Stop Time  0947    PT Time Calculation (min)  47 min       Past Medical History:  Diagnosis Date  . Abnormal ECG    a. 08/2012 Abnl ETT with ST changes in recovery;  b. 08/2012 Ex MV, ex time 10:00, EF 66%, no ischemia/infarct.  . Allergic rhinitis   . Anxiety   . Cataract   . Depression   . Diverticular disease   . Dyspnea on exertion   . ETOH abuse   . GERD (gastroesophageal reflux disease)   . Gout   . Hemorrhoids   . Hiatal hernia   . HTN (hypertension)   . Low testosterone   . MACROCYTIC ANEMIA   . NONSPEC ELEVATION OF LEVELS OF TRANSAMINASE/LDH   . Nonspecific abnormal finding in stool contents   . Palpitations   . PTSD (post-traumatic stress disorder)   . PTSD (post-traumatic stress disorder)    [name] - [employer]    Past Surgical History:  Procedure Laterality Date  . CHOLECYSTECTOMY  2004  . KNEE SURGERY Bilateral   . SHOULDER ARTHROSCOPY Right 01/14/2020    There were no vitals filed for this visit.  Subjective Assessment - 03/04/20 1431    Subjective  COVID-19 screen performed prior to patient entering clinic.  Went to put my arm in my coat sleeve and felt sharp pain in my shoulder.  Hand still numb and can't grip things well.    Patient is accompained by:  Family member    Limitations  Lifting;House hold activities    Patient Stated Goals  use arm again for home activities and shop work.    Currently in  Pain?  Yes    Pain Score  7     Pain Location  Shoulder    Pain Orientation  Right    Pain Descriptors / Indicators  Aching;Discomfort    Pain Type  Surgical pain    Pain Onset  More than a month ago                       [facility] Adult PT Treatment/Exercise - 03/04/20 0001      Exercises   Exercises  Shoulder      Shoulder Exercises: Pulleys   Flexion  5 minutes    Other Pulley Exercises  UE Ranger on wall into right shoulder ER x 3 minutes.      Modalities   Modalities  Corporate treasurer Location  RT shoulder.    Electrical Stimulation Action  IFC    Electrical Stimulation Parameters  80-150 Hz x 20 minutes.    Electrical Stimulation Goals  Pain      Ultrasound   Ultrasound Location  Right UT    Ultrasound Parameters  Low level combo e'stim/U/S at  [facility] [place], [place], [address] Phone: [phone]   Fax:  [phone]  Physical Therapy Treatment  Patient Details  Name: Joseph Larsen MRN: [id number] Date of Birth: 1946/01/29 Referring Provider (PT): Sydnee Cabal, MD   Encounter Date: 03/04/2020  PT End of Session - 03/04/20 1458    Visit Number  19    Number of Visits  26    Date for PT Re-Evaluation  04/03/20    Authorization Type  FOTO, Progress note every 10th visit, KX modifier at 15th visit    PT Start Time  0900    PT Stop Time  0947    PT Time Calculation (min)  47 min       Past Medical History:  Diagnosis Date  . Abnormal ECG    a. 08/2012 Abnl ETT with ST changes in recovery;  b. 08/2012 Ex MV, ex time 10:00, EF 66%, no ischemia/infarct.  . Allergic rhinitis   . Anxiety   . Cataract   . Depression   . Diverticular disease   . Dyspnea on exertion   . ETOH abuse   . GERD (gastroesophageal reflux disease)   . Gout   . Hemorrhoids   . Hiatal hernia   . HTN (hypertension)   . Low testosterone   . MACROCYTIC ANEMIA   . NONSPEC ELEVATION OF LEVELS OF TRANSAMINASE/LDH   . Nonspecific abnormal finding in stool contents   . Palpitations   . PTSD (post-traumatic stress disorder)   . PTSD (post-traumatic stress disorder)    [name]    Past Surgical History:  Procedure Laterality Date  . CHOLECYSTECTOMY  2004  . KNEE SURGERY Bilateral   . SHOULDER ARTHROSCOPY Right 01/14/2020    There were no vitals filed for this visit.  Subjective Assessment - 03/04/20 1431    Subjective  COVID-19 screen performed prior to patient entering clinic.  Went to put my arm in my coat sleeve and felt sharp pain in my shoulder.  Hand still numb and can't grip things well.    Patient is accompained by:  Family member    Limitations  Lifting;House hold activities    Patient Stated Goals  use arm again for home activities and shop work.    Currently in  Pain?  Yes    Pain Score  7     Pain Location  Shoulder    Pain Orientation  Right    Pain Descriptors / Indicators  Aching;Discomfort    Pain Type  Surgical pain    Pain Onset  More than a month ago                       [facility] Adult PT Treatment/Exercise - 03/04/20 0001      Exercises   Exercises  Shoulder      Shoulder Exercises: Pulleys   Flexion  5 minutes    Other Pulley Exercises  UE Ranger on wall into right shoulder ER x 3 minutes.      Modalities   Modalities  Corporate treasurer Location  RT shoulder.    Electrical Stimulation Action  IFC    Electrical Stimulation Parameters  80-150 Hz x 20 minutes.    Electrical Stimulation Goals  Pain      Ultrasound   Ultrasound Location  Right UT    Ultrasound Parameters  Low level combo e'stim/U/S at

## 2020-03-06 ENCOUNTER — Ambulatory Visit: Payer: Medicare Other | Admitting: *Deleted

## 2020-03-06 ENCOUNTER — Other Ambulatory Visit: Payer: Self-pay

## 2020-03-06 DIAGNOSIS — R293 Abnormal posture: Secondary | ICD-10-CM

## 2020-03-06 DIAGNOSIS — M25511 Pain in right shoulder: Secondary | ICD-10-CM

## 2020-03-06 NOTE — Therapy (Signed)
[facility] [place], [place], [address] Phone: [phone]   Fax:  [phone]  Physical Therapy Treatment  Patient Details  Name: Joseph Larsen MRN: [id number] Date of Birth: [birth year] Referring Provider (PT): Sydnee Cabal, MD   Encounter Date: 03/06/2020  PT End of Session - 03/06/20 0918    Visit Number  20    Number of Visits  26    Date for PT Re-Evaluation  04/03/20    Authorization Type  FOTO, Progress note every 10th visit, KX modifier at 15th visit   20th visit 41%    PT Start Time  0900    PT Stop Time  0956    PT Time Calculation (min)  56 min       Past Medical History:  Diagnosis Date  . Abnormal ECG    a. 08/2012 Abnl ETT with ST changes in recovery;  b. 08/2012 Ex MV, ex time 10:00, EF 66%, no ischemia/infarct.  . Allergic rhinitis   . Anxiety   . Cataract   . Depression   . Diverticular disease   . Dyspnea on exertion   . ETOH abuse   . GERD (gastroesophageal reflux disease)   . Gout   . Hemorrhoids   . Hiatal hernia   . HTN (hypertension)   . Low testosterone   . MACROCYTIC ANEMIA   . NONSPEC ELEVATION OF LEVELS OF TRANSAMINASE/LDH   . Nonspecific abnormal finding in stool contents   . Palpitations   . PTSD (post-traumatic stress disorder)   . PTSD (post-traumatic stress disorder)    [name]    Past Surgical History:  Procedure Laterality Date  . CHOLECYSTECTOMY  2004  . KNEE SURGERY Bilateral   . SHOULDER ARTHROSCOPY Right 01/14/2020    There were no vitals filed for this visit.  Subjective Assessment - 03/06/20 0914    Subjective  COVID-19 screen performed prior to patient entering clinic. Marland Kitchen  Hand still numb and can't grip things well.    Limitations  Lifting;House hold activities    Patient Stated Goals  use arm again for home activities and shop work.    Currently in Pain?  Yes    Pain Score  2     Pain Location  Shoulder    Pain Orientation  Right    Pain  Descriptors / Indicators  Aching;Discomfort    Pain Type  Surgical pain    Pain Onset  More than a month ago                       [facility] Adult PT Treatment/Exercise - 03/06/20 0001      Exercises   Exercises  Shoulder      Shoulder Exercises: Pulleys   Flexion  5 minutes    Other Pulley Exercises  UE Ranger on wall into right shoulder ER x 2 minutes.and elevation x 2 mins      Modalities   Modalities  Electrical Stimulation;Ultrasound;Vasopneumatic      Electrical Stimulation   Electrical Stimulation Location  RT shoulder.    Electrical Stimulation Action  IFC    Electrical Stimulation Parameters   80-150hz  x 15 mins    Electrical Stimulation Goals  Pain      Ultrasound   Ultrasound Location  RT UT    Ultrasound Parameters  low level combo 1.5 w/cm2 x 8 mins    Ultrasound Goals  Pain      Vasopneumatic  Patient Active Problem List   Diagnosis Date Noted  . Aortic atherosclerosis ([place]) 03/01/2018  . Barrett's esophagus 01/03/2017  .  Non-healing ulcer ([place]) 10/13/2015  . Neoplasm of uncertain behavior of skin 10/08/2015  . Skin lesion 09/07/2015  . Hypokalemia 03/03/2014  . BPH (benign prostatic hyperplasia) 03/03/2014  . Chest pain   . Palpitations   . Dyspnea on exertion   . ETOH abuse   . PTSD (post-traumatic stress disorder) 10/02/2013  . Abnormal stress test 08/29/2012  . GERD (gastroesophageal reflux disease)   . HTN (hypertension)   . Anxiety   . Depression   . Allergic rhinitis   . Diverticular disease   . Hemorrhoids   . Hiatal hernia   . Gout   . MACROCYTIC ANEMIA 08/09/2010  . NONSPEC ELEVATION OF LEVELS OF TRANSAMINASE/LDH 08/09/2010  . NONSPECIFIC ABNORMAL FINDING IN STOOL CONTENTS 08/09/2010    Dorris Vangorder,CHRIS, PTA 03/06/2020, 12:52 PM  [facility] [address] [place], [place], [address] Phone: [phone]   Fax:  [phone]  Name: Joseph Larsen MRN: [id number] Date of Birth: 16-Sep-1946  [facility] [place], [place], [address] Phone: [phone]   Fax:  [phone]  Physical Therapy Treatment  Patient Details  Name: Joseph Larsen MRN: [id number] Date of Birth: [birth year] Referring Provider (PT): Sydnee Cabal, MD   Encounter Date: 03/06/2020  PT End of Session - 03/06/20 0918    Visit Number  20    Number of Visits  26    Date for PT Re-Evaluation  04/03/20    Authorization Type  FOTO, Progress note every 10th visit, KX modifier at 15th visit   20th visit 41%    PT Start Time  0900    PT Stop Time  0956    PT Time Calculation (min)  56 min       Past Medical History:  Diagnosis Date  . Abnormal ECG    a. 08/2012 Abnl ETT with ST changes in recovery;  b. 08/2012 Ex MV, ex time 10:00, EF 66%, no ischemia/infarct.  . Allergic rhinitis   . Anxiety   . Cataract   . Depression   . Diverticular disease   . Dyspnea on exertion   . ETOH abuse   . GERD (gastroesophageal reflux disease)   . Gout   . Hemorrhoids   . Hiatal hernia   . HTN (hypertension)   . Low testosterone   . MACROCYTIC ANEMIA   . NONSPEC ELEVATION OF LEVELS OF TRANSAMINASE/LDH   . Nonspecific abnormal finding in stool contents   . Palpitations   . PTSD (post-traumatic stress disorder)   . PTSD (post-traumatic stress disorder)    [name]    Past Surgical History:  Procedure Laterality Date  . CHOLECYSTECTOMY  2004  . KNEE SURGERY Bilateral   . SHOULDER ARTHROSCOPY Right 01/14/2020    There were no vitals filed for this visit.  Subjective Assessment - 03/06/20 0914    Subjective  COVID-19 screen performed prior to patient entering clinic. Marland Kitchen  Hand still numb and can't grip things well.    Limitations  Lifting;House hold activities    Patient Stated Goals  use arm again for home activities and shop work.    Currently in Pain?  Yes    Pain Score  2     Pain Location  Shoulder    Pain Orientation  Right    Pain  Descriptors / Indicators  Aching;Discomfort    Pain Type  Surgical pain    Pain Onset  More than a month ago                       [facility] Adult PT Treatment/Exercise - 03/06/20 0001      Exercises   Exercises  Shoulder      Shoulder Exercises: Pulleys   Flexion  5 minutes    Other Pulley Exercises  UE Ranger on wall into right shoulder ER x 2 minutes.and elevation x 2 mins      Modalities   Modalities  Electrical Stimulation;Ultrasound;Vasopneumatic      Electrical Stimulation   Electrical Stimulation Location  RT shoulder.    Electrical Stimulation Action  IFC    Electrical Stimulation Parameters   80-150hz  x 15 mins    Electrical Stimulation Goals  Pain      Ultrasound   Ultrasound Location  RT UT    Ultrasound Parameters  low level combo 1.5 w/cm2 x 8 mins    Ultrasound Goals  Pain      Vasopneumatic

## 2020-03-09 ENCOUNTER — Other Ambulatory Visit: Payer: Self-pay

## 2020-03-09 ENCOUNTER — Ambulatory Visit: Payer: Medicare Other | Admitting: Physical Therapy

## 2020-03-09 ENCOUNTER — Encounter: Payer: Self-pay | Admitting: Physical Therapy

## 2020-03-09 DIAGNOSIS — M25511 Pain in right shoulder: Secondary | ICD-10-CM

## 2020-03-09 DIAGNOSIS — R293 Abnormal posture: Secondary | ICD-10-CM

## 2020-03-09 NOTE — Therapy (Signed)
[facility] [place], [place], [address] Phone: [phone]   Fax:  [phone]  Physical Therapy Treatment  Patient Details  Name: Joseph Larsen MRN: [id number] Date of Birth: Jul 27, 1946 Referring Provider (PT): Sydnee Cabal, MD   Encounter Date: 03/09/2020  PT End of Session - 03/09/20 0913    Visit Number  21    Number of Visits  26    Date for PT Re-Evaluation  04/03/20    Authorization Type  FOTO, Progress note every 10th visit, KX modifier at 15th visit   20th visit 41%    PT Start Time  0900    PT Stop Time  0955    PT Time Calculation (min)  55 min    Equipment Utilized During Treatment  Other (comment)    Activity Tolerance  Patient tolerated treatment well    Behavior During Therapy  [facility] for tasks assessed/performed       Past Medical History:  Diagnosis Date  . Abnormal ECG    a. 08/2012 Abnl ETT with ST changes in recovery;  b. 08/2012 Ex MV, ex time 10:00, EF 66%, no ischemia/infarct.  . Allergic rhinitis   . Anxiety   . Cataract   . Depression   . Diverticular disease   . Dyspnea on exertion   . ETOH abuse   . GERD (gastroesophageal reflux disease)   . Gout   . Hemorrhoids   . Hiatal hernia   . HTN (hypertension)   . Low testosterone   . MACROCYTIC ANEMIA   . NONSPEC ELEVATION OF LEVELS OF TRANSAMINASE/LDH   . Nonspecific abnormal finding in stool contents   . Palpitations   . PTSD (post-traumatic stress disorder)   . PTSD (post-traumatic stress disorder)    [name]    Past Surgical History:  Procedure Laterality Date  . CHOLECYSTECTOMY  2004  . KNEE SURGERY Bilateral   . SHOULDER ARTHROSCOPY Right 01/14/2020    There were no vitals filed for this visit.  Subjective Assessment - 03/09/20 0909    Subjective  COVID-19 screen performed prior to patient entering clinic. Marland Kitchen  Pt reporting no pain at rest and 3/10 pain with movement.    Patient is accompained by:  Family member    Limitations  Lifting;House hold activities    Patient Stated Goals  use arm again for home activities and shop work.    Currently in Pain?  Yes    Pain Score  3     Pain Location  Shoulder    Pain Orientation  Right    Pain Descriptors / Indicators  Aching    Pain Type  Surgical pain    Pain Onset  More than a month ago                       [facility] Adult PT Treatment/Exercise - 03/09/20 0001      Exercises   Exercises  Shoulder      Shoulder Exercises: Supine   External Rotation  AAROM;15 reps    Flexion  AAROM;15 reps    Other Supine Exercises  twisting red thera-gripper strip x 15 reps      Shoulder Exercises: Pulleys   Flexion  3 minutes    Other Pulley Exercises  UE Ranger on wall into right shoulder ER x 2 minutes.and elevation x 2 mins      Modalities   Modalities  Electrical Stimulation;Ultrasound;Vasopneumatic  Administrator, arts  IFC    Electrical Stimulation Parameters  80-150 Hz x 15 minutes    Electrical Stimulation Goals  Pain      Vasopneumatic   Number Minutes Vasopneumatic   15 minutes    Vasopnuematic Location   Shoulder    Vasopneumatic Pressure  Low    Vasopneumatic Temperature   34      Manual Therapy   Manual Therapy  Passive ROM    Passive ROM    PROM into right shoulder  ER               PT Education - 03/09/20 0913    Education Details  pt edu in exercise form and techniques    Person(s) Educated  Patient    Methods  Explanation;Demonstration    Comprehension  Verbalized understanding;Returned demonstration       PT Short Term Goals - 01/21/20 1332      PT SHORT TERM GOAL #1   Title  STG=LTG        PT Long Term Goals - 03/09/20 0917      PT LONG TERM GOAL #1   Title  Patient will be independent with HEP and its progression    Time  4    Period  Weeks    Status  On-going      PT LONG TERM GOAL #2   Title  Patient will  demonstrate 140+ degrees of right shoulder flexion AROM to improve ability to perform overhead tasks.    Time  4    Period  Weeks      PT LONG TERM GOAL #3   Title  Patient will demonstate 55+ degrees of right shoulder ER AROM to improve ability to don/doff apparel.    Time  4    Period  Weeks      PT LONG TERM GOAL #4   Title  Patient will report ability to perform ADLs and home activities with right shoulder pain less than or equal to 4/10.    Time  4    Period  Weeks    Status  On-going      PT LONG TERM GOAL #5   Period  Weeks    Status  On-going            Plan - 03/09/20 0915    Clinical Impression Statement  Pt arriving to therapy reporting 3/10 R shoulder pain. Pt still reporting decreased grip strength and difficulty with turning caps off bottles. Pt was able to improve his execises with AAROM with no reports of increased pain. Continue skilled PT.    Personal Factors and Comorbidities  Comorbidity 2    Comorbidities  right shoulder scope 01/14/2020, HTN    Examination-Activity Limitations  Bathing;Locomotion Level;Hygiene/Grooming;Dressing    Examination-Participation Restrictions  Driving    Stability/Clinical Decision Making  Stable/Uncomplicated    Rehab Potential  Good    PT Frequency  3x / week    PT Duration  4 weeks    PT Treatment/Interventions  ADLs/Self Care Home Management;Cryotherapy;Electrical Stimulation;Iontophoresis 4mg /ml Dexamethasone;Moist Heat;Ultrasound;Neuromuscular re-education;Therapeutic exercise;Therapeutic activities;Patient/family education;Functional mobility training;Manual techniques;Passive range of motion;Vasopneumatic Device;Taping    PT Next Visit Plan  Progress to pulleys and AAROM exercises per protocol.   Recert to cont PT    PT Home Exercise Plan  see patient education section    Consulted and Agree with Plan of Care  Patient  Patient will benefit from skilled therapeutic intervention in order to improve the following  deficits and impairments:  Decreased activity tolerance, Decreased strength, Decreased range of motion, Pain, Postural dysfunction, Impaired UE functional use  Visit Diagnosis: Acute pain of right shoulder  Abnormal posture     Problem List Patient Active Problem List   Diagnosis Date Noted  . Aortic atherosclerosis ([place]) 03/01/2018  . Barrett's esophagus 01/03/2017  . Non-healing ulcer ([place]) 10/13/2015  . Neoplasm of uncertain behavior of skin 10/08/2015  . Skin lesion 09/07/2015  . Hypokalemia 03/03/2014  . BPH (benign prostatic hyperplasia) 03/03/2014  . Chest pain   . Palpitations   . Dyspnea on exertion   . ETOH abuse   . PTSD (post-traumatic stress disorder) 10/02/2013  . Abnormal stress test 08/29/2012  . GERD (gastroesophageal reflux disease)   . HTN (hypertension)   . Anxiety   . Depression   . Allergic rhinitis   . Diverticular disease   . Hemorrhoids   . Hiatal hernia   . Gout   . MACROCYTIC ANEMIA 08/09/2010  . NONSPEC ELEVATION OF LEVELS OF TRANSAMINASE/LDH 08/09/2010  . NONSPECIFIC ABNORMAL FINDING IN STOOL CONTENTS 08/09/2010    Oretha Caprice, MPT 03/09/2020, 9:42 AM  [facility] [address] [place], [place], [address] Phone: [phone]   Fax:  [phone]  Name: Joseph Larsen MRN: [id number] Date of Birth: 12-01-1946

## 2020-03-11 ENCOUNTER — Encounter: Payer: Self-pay | Admitting: Physical Therapy

## 2020-03-11 ENCOUNTER — Other Ambulatory Visit: Payer: Self-pay

## 2020-03-11 ENCOUNTER — Ambulatory Visit: Payer: Medicare Other | Admitting: Physical Therapy

## 2020-03-11 DIAGNOSIS — M25511 Pain in right shoulder: Secondary | ICD-10-CM | POA: Diagnosis not present

## 2020-03-11 DIAGNOSIS — R293 Abnormal posture: Secondary | ICD-10-CM

## 2020-03-11 NOTE — Therapy (Signed)
[facility] [place], [place], [address] Phone: [phone]   Fax:  [phone]  Physical Therapy Treatment  Patient Details  Name: Joseph Larsen MRN: [id number] Date of Birth: Jan 10, 1946 Referring Provider (PT): Sydnee Cabal, MD   Encounter Date: 03/11/2020  PT End of Session - 03/11/20 1302    Visit Number  22    Number of Visits  26    Date for PT Re-Evaluation  04/03/20    Authorization Type  FOTO, Progress note every 10th visit, KX modifier at 15th visit   20th visit 41%    PT Start Time  1301    PT Stop Time  1348    PT Time Calculation (min)  47 min    Activity Tolerance  Patient tolerated treatment well    Behavior During Therapy  [facility] for tasks assessed/performed       Past Medical History:  Diagnosis Date  . Abnormal ECG    a. 08/2012 Abnl ETT with ST changes in recovery;  b. 08/2012 Ex MV, ex time 10:00, EF 66%, no ischemia/infarct.  . Allergic rhinitis   . Anxiety   . Cataract   . Depression   . Diverticular disease   . Dyspnea on exertion   . ETOH abuse   . GERD (gastroesophageal reflux disease)   . Gout   . Hemorrhoids   . Hiatal hernia   . HTN (hypertension)   . Low testosterone   . MACROCYTIC ANEMIA   . NONSPEC ELEVATION OF LEVELS OF TRANSAMINASE/LDH   . Nonspecific abnormal finding in stool contents   . Palpitations   . PTSD (post-traumatic stress disorder)   . PTSD (post-traumatic stress disorder)    [name]    Past Surgical History:  Procedure Laterality Date  . CHOLECYSTECTOMY  2004  . KNEE SURGERY Bilateral   . SHOULDER ARTHROSCOPY Right 01/14/2020    There were no vitals filed for this visit.  Subjective Assessment - 03/11/20 1302    Subjective  COVID-19 screen performed prior to patient entering clinic. Reports he mowed yesterday with his zero turn and had some pain late last night.    Limitations  Lifting;House hold activities    Patient Stated Goals  use arm again  for home activities and shop work.    Currently in Pain?  Yes    Pain Score  2     Pain Location  Shoulder    Pain Orientation  Right    Pain Descriptors / Indicators  Discomfort    Pain Type  Surgical pain    Pain Onset  More than a month ago    Pain Frequency  Constant         [facility] PT Assessment - 03/11/20 0001      Assessment   Medical Diagnosis  Encounter for other orthopedic aftercare    Referring Provider (PT)  Sydnee Cabal, MD    Onset Date/Surgical Date  01/14/20    Next MD Visit  03/18/2020      Precautions   Precautions  Shoulder    Type of Shoulder Precautions  R shoulder scope, possible RTC and biceps tenodesis                   [facility] Adult PT Treatment/Exercise - 03/11/20 0001      Shoulder Exercises: Supine   Protraction  AAROM;Both;20 reps    External Rotation  AAROM;20 reps    Flexion  AAROM;Both;20 reps  Shoulder Exercises: Pulleys   Flexion  5 minutes      Shoulder Exercises: ROM/Strengthening   Ranger  Standing RUE ranger into flexion x20 reps    Wall Wash  into flex to fatigue      Modalities   Modalities  Electrical Stimulation;Vasopneumatic      Acupuncturist Location  R shoulder    Electrical Stimulation Action  Pre-Mod    Electrical Stimulation Parameters  80-150 hz x15 min    Electrical Stimulation Goals  Pain      Vasopneumatic   Number Minutes Vasopneumatic   15 minutes    Vasopnuematic Location   Shoulder    Vasopneumatic Pressure  Low    Vasopneumatic Temperature   34      Manual Therapy   Manual Therapy  Passive ROM    Passive ROM  PROM of R shoulder into flexion, ER, IR with holds at end range               PT Short Term Goals - 01/21/20 1332      PT SHORT TERM GOAL #1   Title  STG=LTG        PT Long Term Goals - 03/09/20 0917      PT LONG TERM GOAL #1   Title  Patient will be independent with HEP and its progression    Time  4    Period  Weeks    Status   On-going      PT LONG TERM GOAL #2   Title  Patient will demonstrate 140+ degrees of right shoulder flexion AROM to improve ability to perform overhead tasks.    Time  4    Period  Weeks      PT LONG TERM GOAL #3   Title  Patient will demonstate 55+ degrees of right shoulder ER AROM to improve ability to don/doff apparel.    Time  4    Period  Weeks      PT LONG TERM GOAL #4   Title  Patient will report ability to perform ADLs and home activities with right shoulder pain less than or equal to 4/10.    Time  4    Period  Weeks    Status  On-going      PT LONG TERM GOAL #5   Period  Weeks    Status  On-going            Plan - 03/11/20 1554    Clinical Impression Statement  Patient presented in clinic with reports of low level R shoulder discomfort. Patient able to demonstrate good ROM in antigravity position such as flexion at wall or with UE ranger. Firm end feels and smooth arc of motion noted during PROM of R shoulder in all directions. Normal modalities response noted following removal of the modalities.    Personal Factors and Comorbidities  Comorbidity 2    Comorbidities  right shoulder scope 01/14/2020, HTN    Examination-Activity Limitations  Bathing;Locomotion Level;Hygiene/Grooming;Dressing    Examination-Participation Restrictions  Driving    Stability/Clinical Decision Making  Stable/Uncomplicated    Rehab Potential  Good    PT Frequency  3x / week    PT Duration  4 weeks    PT Treatment/Interventions  ADLs/Self Care Home Management;Cryotherapy;Electrical Stimulation;Iontophoresis 4mg /ml Dexamethasone;Moist Heat;Ultrasound;Neuromuscular re-education;Therapeutic exercise;Therapeutic activities;Patient/family education;Functional mobility training;Manual techniques;Passive range of motion;Vasopneumatic Device;Taping    PT Next Visit Plan  Progress to pulleys and AAROM exercises per protocol.  Recert to cont PT    PT Home Exercise Plan  see patient education section     Consulted and Agree with Plan of Care  Patient       Patient will benefit from skilled therapeutic intervention in order to improve the following deficits and impairments:  Decreased activity tolerance, Decreased strength, Decreased range of motion, Pain, Postural dysfunction, Impaired UE functional use  Visit Diagnosis: Acute pain of right shoulder  Abnormal posture     Problem List Patient Active Problem List   Diagnosis Date Noted  . Aortic atherosclerosis ([place]) 03/01/2018  . Barrett's esophagus 01/03/2017  . Non-healing ulcer ([place]) 10/13/2015  . Neoplasm of uncertain behavior of skin 10/08/2015  . Skin lesion 09/07/2015  . Hypokalemia 03/03/2014  . BPH (benign prostatic hyperplasia) 03/03/2014  . Chest pain   . Palpitations   . Dyspnea on exertion   . ETOH abuse   . PTSD (post-traumatic stress disorder) 10/02/2013  . Abnormal stress test 08/29/2012  . GERD (gastroesophageal reflux disease)   . HTN (hypertension)   . Anxiety   . Depression   . Allergic rhinitis   . Diverticular disease   . Hemorrhoids   . Hiatal hernia   . Gout   . MACROCYTIC ANEMIA 08/09/2010  . NONSPEC ELEVATION OF LEVELS OF TRANSAMINASE/LDH 08/09/2010  . NONSPECIFIC ABNORMAL FINDING IN STOOL CONTENTS 08/09/2010    Standley Brooking, PTA 03/11/2020, 3:59 PM  [facility] [address] [place], [place], [address] Phone: [phone]   Fax:  [phone]  Name: Joseph Larsen MRN: [id number] Date of Birth: 21-Nov-1946

## 2020-03-16 ENCOUNTER — Other Ambulatory Visit: Payer: Self-pay

## 2020-03-16 ENCOUNTER — Encounter: Payer: Self-pay | Admitting: Physical Therapy

## 2020-03-16 ENCOUNTER — Ambulatory Visit: Payer: Medicare Other | Attending: Specialist | Admitting: Physical Therapy

## 2020-03-16 DIAGNOSIS — R293 Abnormal posture: Secondary | ICD-10-CM | POA: Diagnosis not present

## 2020-03-16 DIAGNOSIS — M25511 Pain in right shoulder: Secondary | ICD-10-CM | POA: Diagnosis not present

## 2020-03-16 NOTE — Therapy (Signed)
[facility] [place], [place], [address] Phone: [phone]   Fax:  [phone]  Physical Therapy Treatment  Patient Details  Name: Joseph Larsen MRN: [id number] Date of Birth: 07/21/1946 Referring Provider (PT): Sydnee Cabal, MD   Encounter Date: 03/16/2020  PT End of Session - 03/16/20 0739    Visit Number  23    Number of Visits  34    Date for PT Re-Evaluation  05/08/20    Authorization Type  FOTO, Progress note every 10th visit, KX modifier at 15th visit   20th visit 41%    PT Start Time  0730    PT Stop Time  0826    PT Time Calculation (min)  56 min    Activity Tolerance  Patient tolerated treatment well    Behavior During Therapy  [facility] for tasks assessed/performed       Past Medical History:  Diagnosis Date  . Abnormal ECG    a. 08/2012 Abnl ETT with ST changes in recovery;  b. 08/2012 Ex MV, ex time 10:00, EF 66%, no ischemia/infarct.  . Allergic rhinitis   . Anxiety   . Cataract   . Depression   . Diverticular disease   . Dyspnea on exertion   . ETOH abuse   . GERD (gastroesophageal reflux disease)   . Gout   . Hemorrhoids   . Hiatal hernia   . HTN (hypertension)   . Low testosterone   . MACROCYTIC ANEMIA   . NONSPEC ELEVATION OF LEVELS OF TRANSAMINASE/LDH   . Nonspecific abnormal finding in stool contents   . Palpitations   . PTSD (post-traumatic stress disorder)   . PTSD (post-traumatic stress disorder)    [name]    Past Surgical History:  Procedure Laterality Date  . CHOLECYSTECTOMY  2004  . KNEE SURGERY Bilateral   . SHOULDER ARTHROSCOPY Right 01/14/2020    There were no vitals filed for this visit.  Subjective Assessment - 03/16/20 0737    Subjective  COVID-19 screen performed prior to patient entering clinic. Reports he mowed for about 5 hrs yesterday 2/10 dull pain right now.    Limitations  Lifting;House hold activities    Patient Stated Goals  use arm again for home  activities and shop work.    Currently in Pain?  Yes    Pain Score  2     Pain Location  Shoulder    Pain Orientation  Right    Pain Descriptors / Indicators  Dull;Sore    Pain Type  Surgical pain    Pain Onset  More than a month ago    Pain Frequency  Intermittent         [facility] PT Assessment - 03/16/20 0001      Assessment   Medical Diagnosis  Encounter for other orthopedic aftercare    Referring Provider (PT)  Sydnee Cabal, MD    Onset Date/Surgical Date  01/14/20    Next MD Visit  03/18/2020      Precautions   Precautions  Shoulder    Type of Shoulder Precautions  R shoulder scope, possible RTC and biceps tenodesis      PROM   Right/Left Shoulder  Right    Right Shoulder Flexion  155 Degrees    Right Shoulder External Rotation  64 Degrees                   [facility] Adult PT Treatment/Exercise - 03/16/20 0001  Exercises   Exercises  Shoulder      Shoulder Exercises: Supine   Protraction  AAROM;Both;20 reps    External Rotation  AAROM;20 reps    Flexion  AAROM;Both;20 reps      Shoulder Exercises: Pulleys   Flexion  5 minutes      Shoulder Exercises: Therapy Ball   Flexion  Right;20 reps      Shoulder Exercises: ROM/Strengthening   Ranger  Standing RUE ranger into flexion, CW, CCW, and ER x2 mins each      Modalities   Modalities  Electrical Stimulation;Vasopneumatic      Electrical Stimulation   Electrical Stimulation Location  R shoulder    Electrical Stimulation Action  pre-mod    Electrical Stimulation Parameters  80-150 hz x15 mins    Electrical Stimulation Goals  Pain      Vasopneumatic   Number Minutes Vasopneumatic   15 minutes    Vasopnuematic Location   Shoulder    Vasopneumatic Pressure  Low    Vasopneumatic Temperature   34      Manual Therapy   Manual Therapy  Passive ROM    Passive ROM  PROM of R shoulder into flexion, ER, IR with holds at end range               PT Short Term Goals - 01/21/20 1332      PT  SHORT TERM GOAL #1   Title  STG=LTG        PT Long Term Goals - 03/09/20 0917      PT LONG TERM GOAL #1   Title  Patient will be independent with HEP and its progression    Time  4    Period  Weeks    Status  On-going      PT LONG TERM GOAL #2   Title  Patient will demonstrate 140+ degrees of right shoulder flexion AROM to improve ability to perform overhead tasks.    Time  4    Period  Weeks      PT LONG TERM GOAL #3   Title  Patient will demonstate 55+ degrees of right shoulder ER AROM to improve ability to don/doff apparel.    Time  4    Period  Weeks      PT LONG TERM GOAL #4   Title  Patient will report ability to perform ADLs and home activities with right shoulder pain less than or equal to 4/10.    Time  4    Period  Weeks    Status  On-going      PT LONG TERM GOAL #5   Period  Weeks    Status  On-going            Plan - 03/16/20 0744    Clinical Impression Statement  Patient responded well to therapy session with minimal reports of increased discomfort. Patient noted with smooth arcs of motion with PROM. Goals ongoing at this time. Patient 9 weeks post op 03/17/2020. Begin AROM TEs per tolerance.  Normal response to modalities.    Personal Factors and Comorbidities  Comorbidity 2    Comorbidities  right shoulder scope 01/14/2020, HTN    Examination-Activity Limitations  Bathing;Locomotion Level;Hygiene/Grooming;Dressing    Examination-Participation Restrictions  Driving    Stability/Clinical Decision Making  Stable/Uncomplicated    Clinical Decision Making  Low    Rehab Potential  Good    PT Frequency  3x / week    PT Duration  4 weeks    PT Treatment/Interventions  ADLs/Self Care Home Management;Cryotherapy;Electrical Stimulation;Iontophoresis 4mg /ml Dexamethasone;Moist Heat;Ultrasound;Neuromuscular re-education;Therapeutic exercise;Therapeutic activities;Patient/family education;Functional mobility training;Manual techniques;Passive range of  motion;Vasopneumatic Device;Taping    PT Next Visit Plan  Progress to pulleys, AAROM to AROM exercises per protocol.   Recert sent cont PT    PT Home Exercise Plan  see patient education section    Consulted and Agree with Plan of Care  Patient       Patient will benefit from skilled therapeutic intervention in order to improve the following deficits and impairments:  Decreased activity tolerance, Decreased strength, Decreased range of motion, Pain, Postural dysfunction, Impaired UE functional use  Visit Diagnosis: Acute pain of right shoulder - Plan: PT plan of care cert/re-cert  Abnormal posture - Plan: PT plan of care cert/re-cert     Problem List Patient Active Problem List   Diagnosis Date Noted  . Aortic atherosclerosis ([place]) 03/01/2018  . Barrett's esophagus 01/03/2017  . Non-healing ulcer ([place]) 10/13/2015  . Neoplasm of uncertain behavior of skin 10/08/2015  . Skin lesion 09/07/2015  . Hypokalemia 03/03/2014  . BPH (benign prostatic hyperplasia) 03/03/2014  . Chest pain   . Palpitations   . Dyspnea on exertion   . ETOH abuse   . PTSD (post-traumatic stress disorder) 10/02/2013  . Abnormal stress test 08/29/2012  . GERD (gastroesophageal reflux disease)   . HTN (hypertension)   . Anxiety   . Depression   . Allergic rhinitis   . Diverticular disease   . Hemorrhoids   . Hiatal hernia   . Gout   . MACROCYTIC ANEMIA 08/09/2010  . NONSPEC ELEVATION OF LEVELS OF TRANSAMINASE/LDH 08/09/2010  . NONSPECIFIC ABNORMAL FINDING IN STOOL CONTENTS 08/09/2010    Gabriela Eves, PT, DPT 03/16/2020, 8:36 AM  [facility] [address] [place], [place], [address] Phone: [phone]   Fax:  [phone]  Name: Joseph Larsen MRN: [id number] Date of Birth: 1946-07-17

## 2020-03-18 ENCOUNTER — Ambulatory Visit: Payer: Medicare Other | Admitting: Physical Therapy

## 2020-03-18 ENCOUNTER — Encounter: Payer: Self-pay | Admitting: Physical Therapy

## 2020-03-18 ENCOUNTER — Other Ambulatory Visit: Payer: Self-pay

## 2020-03-18 DIAGNOSIS — M25511 Pain in right shoulder: Secondary | ICD-10-CM

## 2020-03-18 DIAGNOSIS — R293 Abnormal posture: Secondary | ICD-10-CM

## 2020-03-18 NOTE — Therapy (Signed)
[facility] [place], [place], [address] Phone: [phone]   Fax:  [phone]  Physical Therapy Treatment  Patient Details  Name: Joseph Larsen MRN: [id number] Date of Birth: June 27, 1946 Referring Provider (PT): Sydnee Cabal, MD   Encounter Date: 03/18/2020  PT End of Session - 03/18/20 0817    Visit Number  24    Number of Visits  34    Date for PT Re-Evaluation  05/08/20    Authorization Type  FOTO, Progress note every 10th visit, KX modifier at 15th visit   20th visit 41%    PT Start Time  0731    PT Stop Time  0827    PT Time Calculation (min)  56 min    Activity Tolerance  Patient tolerated treatment well    Behavior During Therapy  [facility] for tasks assessed/performed       Past Medical History:  Diagnosis Date  . Abnormal ECG    a. 08/2012 Abnl ETT with ST changes in recovery;  b. 08/2012 Ex MV, ex time 10:00, EF 66%, no ischemia/infarct.  . Allergic rhinitis   . Anxiety   . Cataract   . Depression   . Diverticular disease   . Dyspnea on exertion   . ETOH abuse   . GERD (gastroesophageal reflux disease)   . Gout   . Hemorrhoids   . Hiatal hernia   . HTN (hypertension)   . Low testosterone   . MACROCYTIC ANEMIA   . NONSPEC ELEVATION OF LEVELS OF TRANSAMINASE/LDH   . Nonspecific abnormal finding in stool contents   . Palpitations   . PTSD (post-traumatic stress disorder)   . PTSD (post-traumatic stress disorder)    [name]    Past Surgical History:  Procedure Laterality Date  . CHOLECYSTECTOMY  2004  . KNEE SURGERY Bilateral   . SHOULDER ARTHROSCOPY Right 01/14/2020    There were no vitals filed for this visit.  Subjective Assessment - 03/18/20 0814    Subjective  COVID-19 screen performed prior to patient entering clinic. Reports still sore from mowing the yard. 2/10.    Limitations  Lifting;House hold activities    Patient Stated Goals  use arm again for home activities and shop work.     Currently in Pain?  Yes    Pain Score  2     Pain Location  Shoulder    Pain Orientation  Right    Pain Descriptors / Indicators  Dull    Pain Type  Surgical pain    Pain Onset  More than a month ago    Pain Frequency  Intermittent         [facility] PT Assessment - 03/18/20 0001      Assessment   Medical Diagnosis  Encounter for other orthopedic aftercare    Referring Provider (PT)  Sydnee Cabal, MD    Onset Date/Surgical Date  01/14/20    Next MD Visit  03/18/2020      Precautions   Precautions  Shoulder    Type of Shoulder Precautions  R shoulder scope, possible RTC and biceps tenodesis                   [facility] Adult PT Treatment/Exercise - 03/18/20 0001      Exercises   Exercises  Shoulder      Shoulder Exercises: Supine   Protraction  Both;20 reps;AROM    Horizontal ABduction  AAROM;10 reps  Shoulder Exercises: Sidelying   External Rotation  AROM;Right;20 reps      Shoulder Exercises: Standing   External Rotation  AROM;Right;20 reps    Flexion  AROM;20 reps   3 sets to bottom middle and top shelf   Flexion Limitations  with cone, followed by AROM      ABduction  AROM;Both;20 reps    ABduction Limitations  to about 70 degrees preventing compensatory motions      Shoulder Exercises: ROM/Strengthening   Ranger  Standing RUE ranger into flexion x3 mins      Modalities   Modalities  Electrical Stimulation;Vasopneumatic      Acupuncturist Location  R shoulder     Electrical Stimulation Action  pre-mod    Electrical Stimulation Parameters  80-150 hz x15 mins    Electrical Stimulation Goals  Pain      Vasopneumatic   Number Minutes Vasopneumatic   10 minutes    Vasopnuematic Location   Shoulder    Vasopneumatic Pressure  Low    Vasopneumatic Temperature   34      Manual Therapy   Manual Therapy  Passive ROM    Passive ROM  PROM of R shoulder into ER, IR with holds at end range followed by rhythmic  stabilization in varying degres of ER                PT Short Term Goals - 01/21/20 1332      PT SHORT TERM GOAL #1   Title  STG=LTG        PT Long Term Goals - 03/09/20 0917      PT LONG TERM GOAL #1   Title  Patient will be independent with HEP and its progression    Time  4    Period  Weeks    Status  On-going      PT LONG TERM GOAL #2   Title  Patient will demonstrate 140+ degrees of right shoulder flexion AROM to improve ability to perform overhead tasks.    Time  4    Period  Weeks      PT LONG TERM GOAL #3   Title  Patient will demonstate 55+ degrees of right shoulder ER AROM to improve ability to don/doff apparel.    Time  4    Period  Weeks      PT LONG TERM GOAL #4   Title  Patient will report ability to perform ADLs and home activities with right shoulder pain less than or equal to 4/10.    Time  4    Period  Weeks    Status  On-going      PT LONG TERM GOAL #5   Period  Weeks    Status  On-going            Plan - 03/18/20 0831    Clinical Impression Statement  Patient responded well to progession of AROM TEs. Patient demonstrated good scapulohumeral rhythm and minimal UT compensatory motions with AROM flexion. Patient noted with increased pain with abduction beyond 90 degrees and UT compensation therefore patient instructed to stay below 90 degrees to prevent pain and compensatory motions. Patient responded well to rhythmic stabilization. No adverse affects upon removal of modalities.    Personal Factors and Comorbidities  Comorbidity 2    Comorbidities  right shoulder scope 01/14/2020, HTN    Examination-Activity Limitations  Bathing;Locomotion Level;Hygiene/Grooming;Dressing    Examination-Participation Restrictions  Driving    Stability/Clinical  Decision Making  Stable/Uncomplicated    Clinical Decision Making  Low    Rehab Potential  Good    PT Frequency  3x / week    PT Duration  4 weeks    PT Treatment/Interventions  ADLs/Self Care Home  Management;Cryotherapy;Electrical Stimulation;Iontophoresis 4mg /ml Dexamethasone;Moist Heat;Ultrasound;Neuromuscular re-education;Therapeutic exercise;Therapeutic activities;Patient/family education;Functional mobility training;Manual techniques;Passive range of motion;Vasopneumatic Device;Taping    PT Next Visit Plan  Progress AAROM to AROM exercises per protocol, cont PROM to maintain ROM modalities PRN for pain relief    Consulted and Agree with Plan of Care  Patient       Patient will benefit from skilled therapeutic intervention in order to improve the following deficits and impairments:  Decreased activity tolerance, Decreased strength, Decreased range of motion, Pain, Postural dysfunction, Impaired UE functional use  Visit Diagnosis: Acute pain of right shoulder  Abnormal posture     Problem List Patient Active Problem List   Diagnosis Date Noted  . Aortic atherosclerosis ([place]) 03/01/2018  . Barrett's esophagus 01/03/2017  . Non-healing ulcer ([place]) 10/13/2015  . Neoplasm of uncertain behavior of skin 10/08/2015  . Skin lesion 09/07/2015  . Hypokalemia 03/03/2014  . BPH (benign prostatic hyperplasia) 03/03/2014  . Chest pain   . Palpitations   . Dyspnea on exertion   . ETOH abuse   . PTSD (post-traumatic stress disorder) 10/02/2013  . Abnormal stress test 08/29/2012  . GERD (gastroesophageal reflux disease)   . HTN (hypertension)   . Anxiety   . Depression   . Allergic rhinitis   . Diverticular disease   . Hemorrhoids   . Hiatal hernia   . Gout   . MACROCYTIC ANEMIA 08/09/2010  . NONSPEC ELEVATION OF LEVELS OF TRANSAMINASE/LDH 08/09/2010  . NONSPECIFIC ABNORMAL FINDING IN STOOL CONTENTS 08/09/2010    Gabriela Eves, PT, DPT 03/18/2020, 8:34 AM  [facility] [address] [place], [place], [address] Phone: [phone]   Fax:  [phone]  Name: BRODIX SALVINO MRN: [id number] Date of Birth: September 22, 1946

## 2020-03-19 ENCOUNTER — Ambulatory Visit: Payer: Medicare Other | Admitting: Family Medicine

## 2020-03-20 ENCOUNTER — Ambulatory Visit: Payer: Medicare Other | Admitting: Physical Therapy

## 2020-03-20 DIAGNOSIS — Z01818 Encounter for other preprocedural examination: Secondary | ICD-10-CM | POA: Diagnosis not present

## 2020-03-23 ENCOUNTER — Other Ambulatory Visit: Payer: Self-pay

## 2020-03-23 ENCOUNTER — Encounter: Payer: Self-pay | Admitting: Physical Therapy

## 2020-03-23 ENCOUNTER — Ambulatory Visit: Payer: Medicare Other | Admitting: Physical Therapy

## 2020-03-23 DIAGNOSIS — M25511 Pain in right shoulder: Secondary | ICD-10-CM

## 2020-03-23 DIAGNOSIS — R293 Abnormal posture: Secondary | ICD-10-CM

## 2020-03-23 NOTE — Therapy (Signed)
[facility] [place], [place], [address] Phone: [phone]   Fax:  [phone]  Physical Therapy Treatment  Patient Details  Name: Joseph Larsen MRN: [id number] Date of Birth: Jun 15, 1946 Referring Provider (PT): Sydnee Cabal, MD   Encounter Date: 03/23/2020  PT End of Session - 03/23/20 0840    Visit Number  25    Number of Visits  34    Date for PT Re-Evaluation  05/08/20    Authorization Type  FOTO, Progress note every 10th visit, KX modifier at 15th visit   20th visit 41%    PT Start Time  0815    PT Stop Time  0901    PT Time Calculation (min)  46 min    Activity Tolerance  Patient tolerated treatment well    Behavior During Therapy  [facility] for tasks assessed/performed       Past Medical History:  Diagnosis Date  . Abnormal ECG    a. 08/2012 Abnl ETT with ST changes in recovery;  b. 08/2012 Ex MV, ex time 10:00, EF 66%, no ischemia/infarct.  . Allergic rhinitis   . Anxiety   . Cataract   . Depression   . Diverticular disease   . Dyspnea on exertion   . ETOH abuse   . GERD (gastroesophageal reflux disease)   . Gout   . Hemorrhoids   . Hiatal hernia   . HTN (hypertension)   . Low testosterone   . MACROCYTIC ANEMIA   . NONSPEC ELEVATION OF LEVELS OF TRANSAMINASE/LDH   . Nonspecific abnormal finding in stool contents   . Palpitations   . PTSD (post-traumatic stress disorder)   . PTSD (post-traumatic stress disorder)    [name]    Past Surgical History:  Procedure Laterality Date  . CHOLECYSTECTOMY  2004  . KNEE SURGERY Bilateral   . SHOULDER ARTHROSCOPY Right 01/14/2020    There were no vitals filed for this visit.  Subjective Assessment - 03/23/20 0816    Subjective  COVID-19 screen performed prior to patient entering clinic. Patient arrived with some ongoing discomfort yet overall improved. Went to MD and is doing well.    Limitations  Lifting;House hold activities    Patient Stated  Goals  use arm again for home activities and shop work.    Currently in Pain?  Yes    Pain Score  2     Pain Location  Shoulder    Pain Orientation  Right    Pain Descriptors / Indicators  Discomfort;Dull    Pain Type  Surgical pain    Pain Onset  More than a month ago    Pain Frequency  Intermittent    Aggravating Factors   movement in a certain way or sleeping on it    Pain Relieving Factors  rest and meds         [facility] PT Assessment - 03/23/20 0001      ROM / Strength   AROM / PROM / Strength  AROM;PROM      AROM   AROM Assessment Site  Shoulder    Right/Left Shoulder  Right    Right Shoulder Flexion  135 Degrees    Right Shoulder External Rotation  45 Degrees      PROM   Right/Left Shoulder  Right    Right Shoulder Flexion  154 Degrees    Right Shoulder External Rotation  60 Degrees  [facility] Adult PT Treatment/Exercise - 03/23/20 0001      Shoulder Exercises: Prone   Other Prone Exercises  kneeling for rows and ext 1# 3x10 each      Shoulder Exercises: Standing   Flexion  Strengthening;Right;20 reps   to 90 degrees     Shoulder Exercises: Pulleys   Flexion  5 minutes      Shoulder Exercises: ROM/Strengthening   Ranger  Standing RUE ranger into flexion x3 mins      Electrical Stimulation   Electrical Stimulation Location  R shoulder     Electrical Stimulation Action  premod    Electrical Stimulation Parameters  80-150hz  x32min    Electrical Stimulation Goals  Pain      Vasopneumatic   Number Minutes Vasopneumatic   10 minutes    Vasopnuematic Location   Shoulder    Vasopneumatic Pressure  Low    Vasopneumatic Temperature   34      Manual Therapy   Manual Therapy  Passive ROM    Passive ROM  PROM of R shoulder into flexion, ER, IR with holds at end range                PT Short Term Goals - 01/21/20 1332      PT SHORT TERM GOAL #1   Title  STG=LTG        PT Long Term Goals - 03/23/20 0845      PT LONG TERM  GOAL #1   Title  Patient will be independent with HEP and its progression    Time  4    Period  Weeks    Status  On-going      PT LONG TERM GOAL #2   Title  Patient will demonstrate 140+ degrees of right shoulder flexion AROM to improve ability to perform overhead tasks.    Time  4    Period  Weeks    Status  On-going   AROM 135 degrees 03/23/20     PT LONG TERM GOAL #3   Title  Patient will demonstate 55+ degrees of right shoulder ER AROM to improve ability to don/doff apparel.    Time  4    Period  Weeks    Status  On-going      PT LONG TERM GOAL #4   Title  Patient will report ability to perform ADLs and home activities with right shoulder pain less than or equal to 4/10.    Time  4    Period  Weeks    Status  On-going      PT LONG TERM GOAL #5   Title  Patient will demonstrate 4/5 or greater right shoulder MMT in all planes to improve stability during functional tasks.    Time  4    Period  Weeks    Status  On-going            Plan - 03/23/20 [id number]    Clinical Impression Statement  Patient tolerated treatment well today. Focused on ROM for right shoulder to improve mobility. Today some tightness upon arrival yet was able to improve after manual stretching. Patient went to MD and is to cont therapy for ROM and then strengthening. Patient doing well with light ADL's and some ongoing pain with certain movements ans sleeping on shoulder. Goals progressing.    Personal Factors and Comorbidities  Comorbidity 2    Comorbidities  right shoulder scope 01/14/2020, HTN    Examination-Activity Limitations  Bathing;Locomotion Level;Hygiene/Grooming;Dressing  Examination-Participation Restrictions  Driving    Stability/Clinical Decision Making  Stable/Uncomplicated    Rehab Potential  Good    PT Frequency  3x / week    PT Duration  4 weeks    PT Treatment/Interventions  ADLs/Self Care Home Management;Cryotherapy;Electrical Stimulation;Iontophoresis 4mg /ml Dexamethasone;Moist  Heat;Ultrasound;Neuromuscular re-education;Therapeutic exercise;Therapeutic activities;Patient/family education;Functional mobility training;Manual techniques;Passive range of motion;Vasopneumatic Device;Taping    PT Next Visit Plan  Progress AAROM to AROM exercises per protocol, cont PROM to maintain ROM modalities PRN for pain relief    Consulted and Agree with Plan of Care  Patient       Patient will benefit from skilled therapeutic intervention in order to improve the following deficits and impairments:  Decreased activity tolerance, Decreased strength, Decreased range of motion, Pain, Postural dysfunction, Impaired UE functional use  Visit Diagnosis: Acute pain of right shoulder  Abnormal posture     Problem List Patient Active Problem List   Diagnosis Date Noted  . Aortic atherosclerosis ([place]) 03/01/2018  . Barrett's esophagus 01/03/2017  . Non-healing ulcer ([place]) 10/13/2015  . Neoplasm of uncertain behavior of skin 10/08/2015  . Skin lesion 09/07/2015  . Hypokalemia 03/03/2014  . BPH (benign prostatic hyperplasia) 03/03/2014  . Chest pain   . Palpitations   . Dyspnea on exertion   . ETOH abuse   . PTSD (post-traumatic stress disorder) 10/02/2013  . Abnormal stress test 08/29/2012  . GERD (gastroesophageal reflux disease)   . HTN (hypertension)   . Anxiety   . Depression   . Allergic rhinitis   . Diverticular disease   . Hemorrhoids   . Hiatal hernia   . Gout   . MACROCYTIC ANEMIA 08/09/2010  . NONSPEC ELEVATION OF LEVELS OF TRANSAMINASE/LDH 08/09/2010  . NONSPECIFIC ABNORMAL FINDING IN STOOL CONTENTS 08/09/2010    Phillips Climes, PTA 03/23/2020, 9:06 AM  [facility] [place], [place], [address] Phone: [phone]   Fax:  [phone]  Name: SCOTTI LARACUENTE MRN: [id number] Date of Birth: 1946/09/05

## 2020-03-24 ENCOUNTER — Telehealth: Payer: Self-pay | Admitting: Cardiovascular Disease

## 2020-03-24 DIAGNOSIS — K3189 Other diseases of stomach and duodenum: Secondary | ICD-10-CM | POA: Diagnosis not present

## 2020-03-24 DIAGNOSIS — I10 Essential (primary) hypertension: Secondary | ICD-10-CM | POA: Diagnosis not present

## 2020-03-24 DIAGNOSIS — I4891 Unspecified atrial fibrillation: Secondary | ICD-10-CM | POA: Diagnosis not present

## 2020-03-24 DIAGNOSIS — Z9049 Acquired absence of other specified parts of digestive tract: Secondary | ICD-10-CM | POA: Diagnosis not present

## 2020-03-24 DIAGNOSIS — K219 Gastro-esophageal reflux disease without esophagitis: Secondary | ICD-10-CM | POA: Diagnosis not present

## 2020-03-24 NOTE — Telephone Encounter (Signed)
Patient states he had an Endoscopy on today, 03/24/20. Prior to Endoscopy procedure he was advised to hold apixaban (ELIQUIS) 5 MG TABS tablet for 2 days. He states he would like to inquire about how long he is to hold apixaban (ELIQUIS) 5 MG TABS tablet medication post Endoscopy. Please advise.

## 2020-03-24 NOTE — Telephone Encounter (Signed)
He will need to call GI doctor and ask when they feel its safe for him to resume.

## 2020-03-24 NOTE — Telephone Encounter (Signed)
I s/w pt's wife (DPR) and advised the pt will need to reach out to the GI doctor who preformed his procedure as to when they feel it is safe for him to resume the Eliquis, this is per our Pharm-D Melissa. Pt's wife thanked me for the call and the help.

## 2020-03-25 ENCOUNTER — Ambulatory Visit: Payer: Medicare Other | Admitting: Physical Therapy

## 2020-03-25 ENCOUNTER — Other Ambulatory Visit: Payer: Self-pay

## 2020-03-25 ENCOUNTER — Encounter: Payer: Self-pay | Admitting: Physical Therapy

## 2020-03-25 DIAGNOSIS — R293 Abnormal posture: Secondary | ICD-10-CM | POA: Diagnosis not present

## 2020-03-25 DIAGNOSIS — M25511 Pain in right shoulder: Secondary | ICD-10-CM

## 2020-03-25 NOTE — Therapy (Signed)
[facility] [place], [place], [address] Phone: [phone]   Fax:  [phone]  Physical Therapy Treatment  Patient Details  Name: Joseph Larsen MRN: [id number] Date of Birth: 1946/09/03 Referring Provider (PT): Sydnee Cabal, MD   Encounter Date: 03/25/2020  PT End of Session - 03/25/20 0906    Visit Number  26    Number of Visits  34    Date for PT Re-Evaluation  05/08/20    Authorization Type  FOTO, Progress note every 10th visit, KX modifier at 15th visit   20th visit 41%    PT Start Time  0900    PT Stop Time  0945    PT Time Calculation (min)  45 min    Activity Tolerance  Patient tolerated treatment well    Behavior During Therapy  [facility] for tasks assessed/performed       Past Medical History:  Diagnosis Date  . Abnormal ECG    a. 08/2012 Abnl ETT with ST changes in recovery;  b. 08/2012 Ex MV, ex time 10:00, EF 66%, no ischemia/infarct.  . Allergic rhinitis   . Anxiety   . Cataract   . Depression   . Diverticular disease   . Dyspnea on exertion   . ETOH abuse   . GERD (gastroesophageal reflux disease)   . Gout   . Hemorrhoids   . Hiatal hernia   . HTN (hypertension)   . Low testosterone   . MACROCYTIC ANEMIA   . NONSPEC ELEVATION OF LEVELS OF TRANSAMINASE/LDH   . Nonspecific abnormal finding in stool contents   . Palpitations   . PTSD (post-traumatic stress disorder)   . PTSD (post-traumatic stress disorder)    [name]    Past Surgical History:  Procedure Laterality Date  . CHOLECYSTECTOMY  2004  . KNEE SURGERY Bilateral   . SHOULDER ARTHROSCOPY Right 01/14/2020    There were no vitals filed for this visit.  Subjective Assessment - 03/25/20 0906    Subjective  COVID-19 screen performed prior to patient entering clinic. Patient arrived with some ongoing discomfort yet overall improved. Went to MD and is doing well.    Limitations  Lifting;House hold activities    Patient Stated  Goals  use arm again for home activities and shop work.    Currently in Pain?  No/denies         [facility] PT Assessment - 03/25/20 0001      Assessment   Medical Diagnosis  Encounter for other orthopedic aftercare    Referring Provider (PT)  Sydnee Cabal, MD    Onset Date/Surgical Date  01/14/20    Next MD Visit  04/2020      Precautions   Precautions  Shoulder    Type of Shoulder Precautions  R shoulder scope, possible RTC and biceps tenodesis      ROM / Strength   AROM / PROM / Strength  AROM      AROM   Overall AROM   Within functional limits for tasks performed    AROM Assessment Site  Shoulder    Right/Left Shoulder  Right    Right Shoulder Flexion  143 Degrees   supine     PROM   Overall PROM   Within functional limits for tasks performed    PROM Assessment Site  Shoulder    Right/Left Shoulder  Right    Right Shoulder External Rotation  65 Degrees  [facility] Adult PT Treatment/Exercise - 03/25/20 0001      Shoulder Exercises: Supine   Protraction  AROM;Right;20 reps    Flexion  AROM;Right;20 reps      Shoulder Exercises: Pulleys   Flexion  5 minutes      Shoulder Exercises: ROM/Strengthening   Wall Wash  into flex x15 reps, clock wash 12-3 x10 reps      Modalities   Modalities  Electrical Stimulation;Vasopneumatic      Electrical Stimulation   Electrical Stimulation Location  R shoulder    Electrical Stimulation Action  Pre-Mod    Electrical Stimulation Parameters  80-150 hz x10 min    Electrical Stimulation Goals  Pain      Vasopneumatic   Number Minutes Vasopneumatic   10 minutes    Vasopnuematic Location   Shoulder    Vasopneumatic Pressure  Low    Vasopneumatic Temperature   34      Manual Therapy   Manual Therapy  Passive ROM    Passive ROM  PROM of R shoulder into flexion, ER, IR with holds at end range                PT Short Term Goals - 01/21/20 1332      PT SHORT TERM GOAL #1   Title  STG=LTG         PT Long Term Goals - 03/23/20 0845      PT LONG TERM GOAL #1   Title  Patient will be independent with HEP and its progression    Time  4    Period  Weeks    Status  On-going      PT LONG TERM GOAL #2   Title  Patient will demonstrate 140+ degrees of right shoulder flexion AROM to improve ability to perform overhead tasks.    Time  4    Period  Weeks    Status  On-going   AROM 135 degrees 03/23/20     PT LONG TERM GOAL #3   Title  Patient will demonstate 55+ degrees of right shoulder ER AROM to improve ability to don/doff apparel.    Time  4    Period  Weeks    Status  On-going      PT LONG TERM GOAL #4   Title  Patient will report ability to perform ADLs and home activities with right shoulder pain less than or equal to 4/10.    Time  4    Period  Weeks    Status  On-going      PT LONG TERM GOAL #5   Title  Patient will demonstrate 4/5 or greater right shoulder MMT in all planes to improve stability during functional tasks.    Time  4    Period  Weeks    Status  On-going            Plan - 03/25/20 0957    Clinical Impression Statement  Patient presented in clinic with reports of pain with only certain motions. Patient did report some discomfort with clock reaching at wall. Firm end feels and smooth arc of motion noted in all directions of PROM. AROM/PROM measurements have improved as described in today's note although discomfort reported at end range ER. Normal modalities response noted following removal of the modalities.    Personal Factors and Comorbidities  Comorbidity 2    Comorbidities  right shoulder scope 01/14/2020, HTN    Examination-Activity Limitations  Bathing;Locomotion Level;Hygiene/Grooming;Dressing  Examination-Participation Restrictions  Driving    Stability/Clinical Decision Making  Stable/Uncomplicated    Rehab Potential  Good    PT Frequency  3x / week    PT Duration  4 weeks    PT Treatment/Interventions  ADLs/Self Care Home  Management;Cryotherapy;Electrical Stimulation;Iontophoresis 4mg /ml Dexamethasone;Moist Heat;Ultrasound;Neuromuscular re-education;Therapeutic exercise;Therapeutic activities;Patient/family education;Functional mobility training;Manual techniques;Passive range of motion;Vasopneumatic Device;Taping    PT Next Visit Plan  Progress AAROM to AROM exercises per protocol, cont PROM to maintain ROM modalities PRN for pain relief    PT Home Exercise Plan  see patient education section    Consulted and Agree with Plan of Care  Patient       Patient will benefit from skilled therapeutic intervention in order to improve the following deficits and impairments:  Decreased activity tolerance, Decreased strength, Decreased range of motion, Pain, Postural dysfunction, Impaired UE functional use  Visit Diagnosis: Acute pain of right shoulder  Abnormal posture     Problem List Patient Active Problem List   Diagnosis Date Noted  . Aortic atherosclerosis ([place]) 03/01/2018  . Barrett's esophagus 01/03/2017  . Non-healing ulcer ([place]) 10/13/2015  . Neoplasm of uncertain behavior of skin 10/08/2015  . Skin lesion 09/07/2015  . Hypokalemia 03/03/2014  . BPH (benign prostatic hyperplasia) 03/03/2014  . Chest pain   . Palpitations   . Dyspnea on exertion   . ETOH abuse   . PTSD (post-traumatic stress disorder) 10/02/2013  . Abnormal stress test 08/29/2012  . GERD (gastroesophageal reflux disease)   . HTN (hypertension)   . Anxiety   . Depression   . Allergic rhinitis   . Diverticular disease   . Hemorrhoids   . Hiatal hernia   . Gout   . MACROCYTIC ANEMIA 08/09/2010  . NONSPEC ELEVATION OF LEVELS OF TRANSAMINASE/LDH 08/09/2010  . NONSPECIFIC ABNORMAL FINDING IN STOOL CONTENTS 08/09/2010    Standley Brooking, PTA 03/25/2020, 10:01 AM  [facility] [address] [place], [place], [address] Phone: [phone]   Fax:  [phone]  Name: ANATOLI NOLA MRN:  [id number] Date of Birth: 08/18/1946

## 2020-03-27 ENCOUNTER — Encounter: Payer: Self-pay | Admitting: Physical Therapy

## 2020-03-27 ENCOUNTER — Other Ambulatory Visit: Payer: Self-pay

## 2020-03-27 ENCOUNTER — Ambulatory Visit: Payer: Medicare Other | Admitting: Physical Therapy

## 2020-03-27 DIAGNOSIS — R293 Abnormal posture: Secondary | ICD-10-CM | POA: Diagnosis not present

## 2020-03-27 DIAGNOSIS — M25511 Pain in right shoulder: Secondary | ICD-10-CM

## 2020-03-27 NOTE — Therapy (Signed)
[facility] [address] [place], [place], [address] Phone: [phone]   Fax:  [phone]  Physical Therapy Treatment  Patient Details  Name: Joseph Larsen MRN: [id number] Date of Birth: [birth year] Referring Provider (PT): Eugenia Mcalpine, MD   Encounter Date: 03/27/2020  PT End of Session - 03/27/20 1011    Visit Number  27    Number of Visits  34    Date for PT Re-Evaluation  05/08/20    Authorization Type  FOTO, Progress note every 10th visit, KX modifier at 15th visit   20th visit 41%    PT Start Time  0901    PT Stop Time  0958    PT Time Calculation (min)  57 min    Activity Tolerance  Patient tolerated treatment well    Behavior During Therapy  [facility] for tasks assessed/performed       Past Medical History:  Diagnosis Date  . Abnormal ECG    a. 08/2012 Abnl ETT with ST changes in recovery;  b. 08/2012 Ex MV, ex time 10:00, EF 66%, no ischemia/infarct.  . Allergic rhinitis   . Anxiety   . Cataract   . Depression   . Diverticular disease   . Dyspnea on exertion   . ETOH abuse   . GERD (gastroesophageal reflux disease)   . Gout   . Hemorrhoids   . Hiatal hernia   . HTN (hypertension)   . Low testosterone   . MACROCYTIC ANEMIA   . NONSPEC ELEVATION OF LEVELS OF TRANSAMINASE/LDH   . Nonspecific abnormal finding in stool contents   . Palpitations   . PTSD (post-traumatic stress disorder)   . PTSD (post-traumatic stress disorder)    [place] [employer]    Past Surgical History:  Procedure Laterality Date  . CHOLECYSTECTOMY  2004  . KNEE SURGERY Bilateral   . SHOULDER ARTHROSCOPY Right 01/14/2020    There were no vitals filed for this visit.  Subjective Assessment - 03/27/20 0956    Subjective  COVID-19 screen performed prior to patient entering clinic.  I'm pleased with my progress.    Patient is accompained by:  Family member    Limitations  Lifting;House hold activities                        [facility] Adult PT Treatment/Exercise - 03/27/20 0001      Exercises   Exercises  Shoulder      Shoulder Exercises: Standing   Other Standing Exercises  RW 4 with yellow band all performed to fatigue with excellent technique (total 6 minutes.      Shoulder Exercises: Pulleys   Flexion  --   6 minutes.   Other Pulley Exercises  UE ranger on wall x 4 minutes.      Modalities   Modalities  Programmer, applications Location  Right shoulder    Electrical Stimulation Action  Pre-mod.    Electrical Stimulation Parameters  80-150 Hz x 20 minutes.    Electrical Stimulation Goals  Pain      Vasopneumatic   Number Minutes Vasopneumatic   20 minutes    Vasopnuematic Location   --   Right shoulder.   Vasopneumatic Pressure  Low      Manual Therapy   Manual Therapy  Passive ROM    Passive ROM  PROM of patient's right shoulder in supine with focus on flexiona and  ER x 10 minutes.             PT Education - 03/27/20 1010    Education Details  Yellow theraband RW4.    Person(s) Educated  Patient    Methods  Explanation    Comprehension  Verbalized understanding;Returned demonstration       PT Short Term Goals - 01/21/20 1332      PT SHORT TERM GOAL #1   Title  STG=LTG        PT Long Term Goals - 03/23/20 0845      PT LONG TERM GOAL #1   Title  Patient will be independent with HEP and its progression    Time  4    Period  Weeks    Status  On-going      PT LONG TERM GOAL #2   Title  Patient will demonstrate 140+ degrees of right shoulder flexion AROM to improve ability to perform overhead tasks.    Time  4    Period  Weeks    Status  On-going   AROM 135 degrees 03/23/20     PT LONG TERM GOAL #3   Title  Patient will demonstate 55+ degrees of right shoulder ER AROM to improve ability to don/doff apparel.    Time  4    Period  Weeks    Status  On-going      PT LONG  TERM GOAL #4   Title  Patient will report ability to perform ADLs and home activities with right shoulder pain less than or equal to 4/10.    Time  4    Period  Weeks    Status  On-going      PT LONG TERM GOAL #5   Title  Patient will demonstrate 4/5 or greater right shoulder MMT in all planes to improve stability during functional tasks.    Time  4    Period  Weeks    Status  On-going            Plan - 03/27/20 1009    Clinical Impression Statement  Excellent job with the addition of RW 4 exercise with yelow band.  He performed without pain or complaint.    Personal Factors and Comorbidities  Comorbidity 2    Comorbidities  right shoulder scope 01/14/2020, HTN    Examination-Activity Limitations  Bathing;Locomotion Level;Hygiene/Grooming;Dressing    Examination-Participation Restrictions  Driving    Stability/Clinical Decision Making  Stable/Uncomplicated    Rehab Potential  Good    PT Frequency  3x / week    PT Duration  4 weeks    PT Treatment/Interventions  ADLs/Self Care Home Management;Cryotherapy;Electrical Stimulation;Iontophoresis 4mg /ml Dexamethasone;Moist Heat;Ultrasound;Neuromuscular re-education;Therapeutic exercise;Therapeutic activities;Patient/family education;Functional mobility training;Manual techniques;Passive range of motion;Vasopneumatic Device;Taping    PT Next Visit Plan  Progress AAROM to AROM exercises per protocol, cont PROM to maintain ROM modalities PRN for pain relief    PT Home Exercise Plan  see patient education section    Consulted and Agree with Plan of Care  Patient       Patient will benefit from skilled therapeutic intervention in order to improve the following deficits and impairments:  Decreased activity tolerance, Decreased strength, Decreased range of motion, Pain, Postural dysfunction, Impaired UE functional use  Visit Diagnosis: Acute pain of right shoulder  Abnormal posture     Problem List Patient Active Problem List    Diagnosis Date Noted  . Aortic atherosclerosis (HCC) 03/01/2018  . Barrett's esophagus 01/03/2017  .  Non-healing ulcer (HCC) 10/13/2015  . Neoplasm of uncertain behavior of skin 10/08/2015  . Skin lesion 09/07/2015  . Hypokalemia 03/03/2014  . BPH (benign prostatic hyperplasia) 03/03/2014  . Chest pain   . Palpitations   . Dyspnea on exertion   . ETOH abuse   . PTSD (post-traumatic stress disorder) 10/02/2013  . Abnormal stress test 08/29/2012  . GERD (gastroesophageal reflux disease)   . HTN (hypertension)   . Anxiety   . Depression   . Allergic rhinitis   . Diverticular disease   . Hemorrhoids   . Hiatal hernia   . Gout   . MACROCYTIC ANEMIA 08/09/2010  . NONSPEC ELEVATION OF LEVELS OF TRANSAMINASE/LDH 08/09/2010  . NONSPECIFIC ABNORMAL FINDING IN STOOL CONTENTS 08/09/2010    Arin Peral, [place] MPT 03/27/2020, 10:15 AM  [facility] [address] [place], [place], [address] Phone: [phone]   Fax:  [phone]  Name: Joseph Larsen MRN: [id number] Date of Birth: 08-Feb-1946

## 2020-03-30 ENCOUNTER — Ambulatory Visit: Payer: Medicare Other | Admitting: Physical Therapy

## 2020-03-30 ENCOUNTER — Other Ambulatory Visit: Payer: Self-pay

## 2020-03-30 DIAGNOSIS — R293 Abnormal posture: Secondary | ICD-10-CM | POA: Diagnosis not present

## 2020-03-30 DIAGNOSIS — M25511 Pain in right shoulder: Secondary | ICD-10-CM

## 2020-03-30 NOTE — Progress Notes (Signed)
Cardiology Office Note   Date:  04/06/2020   ID:  Joseph Larsen, DOB Mar 16, 1946, MRN [id number]  PCP:  [name], DO  Cardiologist:   Jenkins Rouge, MD   No chief complaint on file.     History of Present Illness: Joseph Larsen is a 74 y.o. male who presents for f//u regarding chest pain and PAF  Previous smoker with HTN.  History of ETOH abuse beer and peach brandy History of abnormal ECG with normal myovue in 2013. Previous palpitations with holder 2015 showing only PAC;s. 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  Had f/u EGD at [facility] 03/24/20 had previous EMR of lesion in fundus with HGD no lesions this time   Has felt weak and some dyspnea Compliant with meds BP actually running low Some palpitations Or feeling his heart beat hard no chest pain    Past Medical History:  Diagnosis Date  . Abnormal ECG    a. 08/2012 Abnl ETT with ST changes in recovery;  b. 08/2012 Ex MV, ex time 10:00, EF 66%, no ischemia/infarct.  . Allergic rhinitis   . Anxiety   . Cataract   . Depression   . Diverticular disease   . Dyspnea on exertion   . ETOH abuse   . GERD (gastroesophageal reflux disease)   . Gout   . Hemorrhoids   . Hiatal hernia   . HTN (hypertension)   . Low testosterone   . MACROCYTIC ANEMIA   . NONSPEC ELEVATION OF LEVELS OF TRANSAMINASE/LDH   . Nonspecific abnormal finding in stool contents   . Palpitations   . PTSD (post-traumatic stress disorder)   . PTSD (post-traumatic stress disorder)    [name] - [employer]    Past Surgical History:  Procedure Laterality Date  . CHOLECYSTECTOMY  2004  . KNEE SURGERY Bilateral   . SHOULDER ARTHROSCOPY Right 01/14/2020     Current Outpatient Medications   Medication Sig Dispense Refill  . amLODipine (NORVASC) 10 MG tablet Take 5 mg by mouth daily.    Marland Kitchen apixaban (ELIQUIS) 5 MG TABS tablet Take 1 tablet (5 mg total) by mouth 2 (two) times daily. 60 tablet 0  . atenolol (TENORMIN) 25 MG tablet Take 1 tablet (25 mg total) by mouth 2 (two) times daily. 60 tablet 3  . cholecalciferol (VITAMIN D) 1000 UNITS tablet Take 1,000 Units by mouth daily.    . diazepam (VALIUM) 10 MG tablet Take 1 tablet (10 mg total) by mouth every 8 (eight) hours as needed for anxiety. (Patient taking differently: Take 10 mg by mouth every 12 (twelve) hours as needed for anxiety. ) 90 tablet 1  . hydrochlorothiazide (HYDRODIURIL) 25 MG tablet Take 25 mg by mouth daily.     Marland Kitchen losartan (COZAAR) 100 MG tablet Take 50 mg by mouth daily.    . Misc Natural Products (OSTEO BI-FLEX/5-LOXIN ADVANCED PO) Take 2 tablets by mouth daily.     . Omega-3 Fatty Acids (FISH OIL) 1000 MG CAPS Take 4 capsules by mouth daily.      Current Facility-Administered Medications  Medication Dose Route Frequency Provider Last Rate Last Admin  . cyanocobalamin ((  VITAMIN B-12)) injection 1,000 mcg  1,000 mcg Intramuscular Q30 days Timmothy Euler, MD   1,000 mcg at 02/13/18 [id number]    Allergies:   Terazosin, Enalapril maleate, Enalapril maleate, Lisinopril, and Penicillins    Social History:  The patient  reports that he quit smoking about 37 years ago. His smoking use included cigarettes. He started smoking about 55 years ago. He smoked 3.00 packs per day. He has never used smokeless tobacco. He reports current alcohol use of about 14.0 standard drinks of alcohol per week. He reports that he does not use drugs.   Family History:  The patient's family history includes Heart disease in his father and mother; Heart murmur in his sister; Hypertension in his brother, sister, sister, sister, and sister; Liver disease in his brother.    ROS:  Please see the history of present illness.   Otherwise, review of  systems are positive for none.   All other systems are reviewed and negative.    PHYSICAL EXAM: VS:  BP 110/70   Pulse 69   Ht 5\' 11"  (1.803 m)   Wt 189 lb (85.7 kg)   SpO2 97%   BMI 26.36 kg/m  , BMI Body mass index is 26.36 kg/m. Affect appropriate Healthy:  appears stated age 77: normal Neck supple with no adenopathy JVP normal no bruits no thyromegaly Lungs clear with no wheezing and good diaphragmatic motion Heart:  S1/S2 no murmur, no rub, gallop or click PMI normal Abdomen: benighn, BS positve, no tenderness, no AAA no bruit.  No HSM or HJR Distal pulses intact with no bruits No edema Neuro non-focal Skin warm and dry No muscular weakness    EKG:  SR rate 56 normal 04/26/18 09/27/19 SR rate 53 LVH    Recent Labs: 03/03/2020: BUN 9; Creatinine, Ser 0.77; Hemoglobin 16.8; Platelets 182; Potassium 3.3; Sodium 142    Lipid Panel    Component Value Date/Time   CHOL 88 (L) 08/31/2018 1021   TRIG 36 08/31/2018 1021   TRIG 82 03/25/2015 0950   HDL 33 (L) 08/31/2018 1021   HDL 38 (L) 03/25/2015 0950   CHOLHDL 2.7 08/31/2018 1021   LDLCALC 48 08/31/2018 1021   LDLCALC 54 07/17/2014 0828   LDLDIRECT 74 01/03/2017 1100      Wt Readings from Last 3 Encounters:  04/06/20 189 lb (85.7 kg)  09/27/19 195 lb (88.5 kg)  01/07/19 179 lb (81.2 kg)      Other studies Reviewed: Additional studies/ records that were reviewed today include: notes from cardiology 2015 TTE, holter myovue old ECG Notes Dr Laurance Flatten [facility] .04/26/18     ASSESSMENT AND PLAN:  1.  Chest Pain:  Normal myovue 2015 and 2019 observe  2. HTN:  BP actually low and with his fatigue d/c normvasc   3. PAF:  Continue eliquis and atenolol  CHADVASC 2 ? Having paroxysms will get 14 day event monitor with  His fatigue also check TTE. He indicates having lab work at [place] that was fine 3 months ago  4. HLD :  Continue statin labs with primary  Seems to have myalgias will hold statin for 6 weeks and see if  helps OTC Co Enzyme Q 5. Orto: post right shoulder replacement [place] still needs PT/OT but ROM better    Current medicines are reviewed at length with the patient today.  The patient does not have concerns regarding medicines.  The following changes have been made:  no change  Labs/ tests ordered today  include: None   Echo  14 day monitor  D/c norvasc    Disposition:   FU with cardiology  6-8 weeks    Signed, Jenkins Rouge, MD  04/06/2020 9:22 AM    [facility] [place], [place], [place]  [address] Phone: [phone]; Fax: [phone]

## 2020-03-30 NOTE — Therapy (Signed)
[facility] [address] [place], [place], [address] Phone: [phone]   Fax:  [phone]  Physical Therapy Treatment  Patient Details  Name: Joseph Larsen MRN: [id number] Date of Birth: 04-Oct-1946 Referring Provider (PT): Eugenia Mcalpine, MD   Encounter Date: 03/30/2020  PT End of Session - 03/30/20 1008    Visit Number  28    Number of Visits  34    Date for PT Re-Evaluation  05/08/20    Authorization Type  FOTO, Progress note every 10th visit, KX modifier at 15th visit   20th visit 41%    PT Start Time  0900    PT Stop Time  0950    PT Time Calculation (min)  50 min    Activity Tolerance  Patient tolerated treatment well    Behavior During Therapy  [facility] for tasks assessed/performed       Past Medical History:  Diagnosis Date  . Abnormal ECG    a. 08/2012 Abnl ETT with ST changes in recovery;  b. 08/2012 Ex MV, ex time 10:00, EF 66%, no ischemia/infarct.  . Allergic rhinitis   . Anxiety   . Cataract   . Depression   . Diverticular disease   . Dyspnea on exertion   . ETOH abuse   . GERD (gastroesophageal reflux disease)   . Gout   . Hemorrhoids   . Hiatal hernia   . HTN (hypertension)   . Low testosterone   . MACROCYTIC ANEMIA   . NONSPEC ELEVATION OF LEVELS OF TRANSAMINASE/LDH   . Nonspecific abnormal finding in stool contents   . Palpitations   . PTSD (post-traumatic stress disorder)   . PTSD (post-traumatic stress disorder)    [place] [employer] - [employer]    Past Surgical History:  Procedure Laterality Date  . CHOLECYSTECTOMY  2004  . KNEE SURGERY Bilateral   . SHOULDER ARTHROSCOPY Right 01/14/2020    There were no vitals filed for this visit.  Subjective Assessment - 03/30/20 0940    Subjective  COVID-19 screen performed prior to patient entering clinic.  Doing good.    Patient is accompained by:  Family member    Limitations  Lifting;House hold activities    Patient Stated Goals  use arm again for home activities  and shop work.    Currently in Pain?  Yes    Pain Score  2     Pain Location  Shoulder    Pain Orientation  Right    Pain Descriptors / Indicators  Discomfort;Dull    Pain Onset  More than a month ago                       [facility] Adult PT Treatment/Exercise - 03/30/20 0001      Exercises   Exercises  Shoulder      Shoulder Exercises: Pulleys   Flexion  5 minutes    Other Pulley Exercises  UE ranger on wall x 5 minutes.      Modalities   Modalities  Programmer, applications Location  RT shoulder.    Electrical Stimulation Action  IFC    Electrical Stimulation Parameters  80-150 Hz x 15 minutes.    Electrical Stimulation Goals  Pain      Vasopneumatic   Number Minutes Vasopneumatic   15 minutes    Vasopnuematic Location   --   Rt shoulder.   Vasopneumatic Pressure  Low  Manual Therapy   Manual Therapy  Passive ROM    Passive ROM  PROM in supine to patient's right shoulder x 14 minutes.               PT Short Term Goals - 01/21/20 1332      PT SHORT TERM GOAL #1   Title  STG=LTG        PT Long Term Goals - 03/23/20 0845      PT LONG TERM GOAL #1   Title  Patient will be independent with HEP and its progression    Time  4    Period  Weeks    Status  On-going      PT LONG TERM GOAL #2   Title  Patient will demonstrate 140+ degrees of right shoulder flexion AROM to improve ability to perform overhead tasks.    Time  4    Period  Weeks    Status  On-going   AROM 135 degrees 03/23/20     PT LONG TERM GOAL #3   Title  Patient will demonstate 55+ degrees of right shoulder ER AROM to improve ability to don/doff apparel.    Time  4    Period  Weeks    Status  On-going      PT LONG TERM GOAL #4   Title  Patient will report ability to perform ADLs and home activities with right shoulder pain less than or equal to 4/10.    Time  4    Period  Weeks    Status  On-going       PT LONG TERM GOAL #5   Title  Patient will demonstrate 4/5 or greater right shoulder MMT in all planes to improve stability during functional tasks.    Time  4    Period  Weeks    Status  On-going            Plan - 03/30/20 1001    Clinical Impression Statement  The patient is making excellent progress.  His passive ER is approaching normal.    Personal Factors and Comorbidities  Comorbidity 2    Comorbidities  right shoulder scope 01/14/2020, HTN    Examination-Activity Limitations  Bathing;Locomotion Level;Hygiene/Grooming;Dressing    Examination-Participation Restrictions  Driving    Stability/Clinical Decision Making  Stable/Uncomplicated    Rehab Potential  Good    PT Frequency  3x / week    PT Duration  4 weeks    PT Treatment/Interventions  ADLs/Self Care Home Management;Cryotherapy;Electrical Stimulation;Iontophoresis 4mg /ml Dexamethasone;Moist Heat;Ultrasound;Neuromuscular re-education;Therapeutic exercise;Therapeutic activities;Patient/family education;Functional mobility training;Manual techniques;Passive range of motion;Vasopneumatic Device;Taping    PT Next Visit Plan  Progress AAROM to AROM exercises per protocol, cont PROM to maintain ROM modalities PRN for pain relief    PT Home Exercise Plan  see patient education section    Consulted and Agree with Plan of Care  Patient       Patient will benefit from skilled therapeutic intervention in order to improve the following deficits and impairments:  Decreased activity tolerance, Decreased strength, Decreased range of motion, Pain, Postural dysfunction, Impaired UE functional use  Visit Diagnosis: Acute pain of right shoulder  Abnormal posture     Problem List Patient Active Problem List   Diagnosis Date Noted  . Aortic atherosclerosis (HCC) 03/01/2018  . Barrett's esophagus 01/03/2017  . Non-healing ulcer (HCC) 10/13/2015  . Neoplasm of uncertain behavior of skin 10/08/2015  . Skin lesion 09/07/2015  .  Hypokalemia 03/03/2014  .  BPH (benign prostatic hyperplasia) 03/03/2014  . Chest pain   . Palpitations   . Dyspnea on exertion   . ETOH abuse   . PTSD (post-traumatic stress disorder) 10/02/2013  . Abnormal stress test 08/29/2012  . GERD (gastroesophageal reflux disease)   . HTN (hypertension)   . Anxiety   . Depression   . Allergic rhinitis   . Diverticular disease   . Hemorrhoids   . Hiatal hernia   . Gout   . MACROCYTIC ANEMIA 08/09/2010  . NONSPEC ELEVATION OF LEVELS OF TRANSAMINASE/LDH 08/09/2010  . NONSPECIFIC ABNORMAL FINDING IN STOOL CONTENTS 08/09/2010    Adelin Ventrella, [place] MPT 03/30/2020, 10:15 AM  [facility] [address] [place], [place], [address] Phone: [phone]   Fax:  [phone]  Name: Joseph Larsen MRN: [id number] Date of Birth: 1946/03/28

## 2020-04-01 ENCOUNTER — Other Ambulatory Visit: Payer: Self-pay

## 2020-04-01 ENCOUNTER — Ambulatory Visit: Payer: Medicare Other | Admitting: Physical Therapy

## 2020-04-01 ENCOUNTER — Encounter: Payer: Self-pay | Admitting: Physical Therapy

## 2020-04-01 DIAGNOSIS — R293 Abnormal posture: Secondary | ICD-10-CM

## 2020-04-01 DIAGNOSIS — M25511 Pain in right shoulder: Secondary | ICD-10-CM

## 2020-04-01 NOTE — Therapy (Signed)
[facility] [place], [place], [address] Phone: [phone]   Fax:  [phone]  Physical Therapy Treatment  Patient Details  Name: Joseph Larsen MRN: [id number] Date of Birth: 06-May-1946 Referring Provider (PT): Sydnee Cabal, MD   Encounter Date: 04/01/2020  PT End of Session - 04/01/20 0946    Visit Number  29    Number of Visits  34    Date for PT Re-Evaluation  05/08/20    Authorization Type  FOTO, Progress note every 10th visit, KX modifier at 15th visit /  20th visit 41%    PT Start Time  0900    PT Stop Time  0955    PT Time Calculation (min)  55 min    Activity Tolerance  Patient tolerated treatment well    Behavior During Therapy  [facility] for tasks assessed/performed       Past Medical History:  Diagnosis Date  . Abnormal ECG    a. 08/2012 Abnl ETT with ST changes in recovery;  b. 08/2012 Ex MV, ex time 10:00, EF 66%, no ischemia/infarct.  . Allergic rhinitis   . Anxiety   . Cataract   . Depression   . Diverticular disease   . Dyspnea on exertion   . ETOH abuse   . GERD (gastroesophageal reflux disease)   . Gout   . Hemorrhoids   . Hiatal hernia   . HTN (hypertension)   . Low testosterone   . MACROCYTIC ANEMIA   . NONSPEC ELEVATION OF LEVELS OF TRANSAMINASE/LDH   . Nonspecific abnormal finding in stool contents   . Palpitations   . PTSD (post-traumatic stress disorder)   . PTSD (post-traumatic stress disorder)    [name] - [employer]    Past Surgical History:  Procedure Laterality Date  . CHOLECYSTECTOMY  2004  . KNEE SURGERY Bilateral   . SHOULDER ARTHROSCOPY Right 01/14/2020    There were no vitals filed for this visit.  Subjective Assessment - 04/01/20 0908    Subjective  COVID-19 screen performed prior to patient entering clinic.  Patient reported doing well overall    Limitations  Lifting;House hold activities    Patient Stated Goals  use arm again for home activities and shop work.     Currently in Pain?  Yes    Pain Score  2     Pain Location  Shoulder    Pain Orientation  Right    Pain Descriptors / Indicators  Discomfort    Pain Type  Surgical pain    Pain Onset  More than a month ago    Pain Frequency  Intermittent    Aggravating Factors   movement    Pain Relieving Factors  rest         [facility] PT Assessment - 04/01/20 0001      AROM   AROM Assessment Site  Shoulder    Right/Left Shoulder  Right    Right Shoulder Flexion  140 Degrees    Right Shoulder External Rotation  60 Degrees      PROM   PROM Assessment Site  Shoulder    Right/Left Shoulder  Right    Right Shoulder Flexion  150 Degrees    Right Shoulder External Rotation  67 Degrees                   [facility] Adult PT Treatment/Exercise - 04/01/20 0001      Shoulder Exercises: Standing   Protraction  Strengthening;Right;Theraband;20 reps  Theraband Level (Shoulder Protraction)  Level 1 (Yellow)    External Rotation  Strengthening;Right;20 reps;Theraband    Theraband Level (Shoulder External Rotation)  Level 1 (Yellow)    Internal Rotation  Strengthening;Right;20 reps;Theraband    Theraband Level (Shoulder Internal Rotation)  Level 1 (Yellow)    Retraction  Strengthening;Right;20 reps;Theraband    Theraband Level (Shoulder Retraction)  Level 1 (Yellow)    Other Standing Exercises  standing scaption 3x10 no weight      Shoulder Exercises: Pulleys   Flexion  5 minutes    Other Pulley Exercises  UE ranger on wall x 4 minutes.      Acupuncturist Location  RT shoulder.    Chartered certified accountant  IFC    Electrical Stimulation Parameters  80-150hz  x73mn    Electrical Stimulation Goals  Pain      Vasopneumatic   Number Minutes Vasopneumatic   15 minutes    Vasopnuematic Location   Shoulder    Vasopneumatic Pressure  Low    Vasopneumatic Temperature   34 for edema      Manual Therapy   Manual Therapy  Passive ROM    Passive ROM  manul  PROM to right shoulder for flexion, IR, ER with holds to improve mobility               PT Short Term Goals - 01/21/20 1332      PT SHORT TERM GOAL #1   Title  STG=LTG        PT Long Term Goals - 04/01/20 0916      PT LONG TERM GOAL #1   Title  Patient will be independent with HEP and its progression    Time  4    Period  Weeks    Status  On-going      PT LONG TERM GOAL #2   Title  Patient will demonstrate 140+ degrees of right shoulder flexion AROM to improve ability to perform overhead tasks.    Time  4    Period  Weeks    Status  Achieved   AROM 140 degrees 04/01/20     PT LONG TERM GOAL #3   Title  Patient will demonstate 55+ degrees of right shoulder ER AROM to improve ability to don/doff apparel.    Time  4    Period  Weeks    Status  Achieved   AROM 60 degrees 04/01/20     PT LONG TERM GOAL #4   Title  Patient will report ability to perform ADLs and home activities with right shoulder pain less than or equal to 4/10.    Time  4    Period  Weeks    Status  On-going      PT LONG TERM GOAL #5   Title  Patient will demonstrate 4/5 or greater right shoulder MMT in all planes to improve stability during functional tasks.    Time  4    Period  Weeks    Status  On-going   NT 04/01/20           Plan - 04/01/20 0947    Clinical Impression Statement  Patient tolerated treatment well today. Patient doing well with PRE's today. Patient has overall decreased pain and improved with AROM in shoulder. Patient has limitations with ADL's due to weakness in right shoulder. MET LTG #2 and #3, others progressing.    Personal Factors and Comorbidities  Comorbidity 2    Comorbidities  right shoulder scope 01/14/2020, HTN    Examination-Activity Limitations  Bathing;Locomotion Level;Hygiene/Grooming;Dressing    Examination-Participation Restrictions  Driving    Stability/Clinical Decision Making  Stable/Uncomplicated    Rehab Potential  Good    PT Frequency  3x / week     PT Duration  4 weeks    PT Treatment/Interventions  ADLs/Self Care Home Management;Cryotherapy;Electrical Stimulation;Iontophoresis 75m/ml Dexamethasone;Moist Heat;Ultrasound;Neuromuscular re-education;Therapeutic exercise;Therapeutic activities;Patient/family education;Functional mobility training;Manual techniques;Passive range of motion;Vasopneumatic Device;Taping    PT Next Visit Plan  Progress AAROM to AROM exercises per protocol, cont PROM to maintain ROM modalities PRN for pain relief    Consulted and Agree with Plan of Care  Patient       Patient will benefit from skilled therapeutic intervention in order to improve the following deficits and impairments:  Decreased activity tolerance, Decreased strength, Decreased range of motion, Pain, Postural dysfunction, Impaired UE functional use  Visit Diagnosis: Acute pain of right shoulder  Abnormal posture     Problem List Patient Active Problem List   Diagnosis Date Noted  . Aortic atherosclerosis ([place] 03/01/2018  . Barrett's esophagus 01/03/2017  . Non-healing ulcer ([place] 10/13/2015  . Neoplasm of uncertain behavior of skin 10/08/2015  . Skin lesion 09/07/2015  . Hypokalemia 03/03/2014  . BPH (benign prostatic hyperplasia) 03/03/2014  . Chest pain   . Palpitations   . Dyspnea on exertion   . ETOH abuse   . PTSD (post-traumatic stress disorder) 10/02/2013  . Abnormal stress test 08/29/2012  . GERD (gastroesophageal reflux disease)   . HTN (hypertension)   . Anxiety   . Depression   . Allergic rhinitis   . Diverticular disease   . Hemorrhoids   . Hiatal hernia   . Gout   . MACROCYTIC ANEMIA 08/09/2010  . NONSPEC ELEVATION OF LEVELS OF TRANSAMINASE/LDH 08/09/2010  . NONSPECIFIC ABNORMAL FINDING IN STOOL CONTENTS 08/09/2010    DPhillips Climes PTA 04/01/2020, 9:56 AM  [facility] [address]Phone: [phone]  Fax:   [phone] Name: Joseph ROMASMRN: [id number] of Birth: [birth year]

## 2020-04-03 ENCOUNTER — Other Ambulatory Visit: Payer: Self-pay

## 2020-04-03 ENCOUNTER — Ambulatory Visit: Payer: Medicare Other | Admitting: Physical Therapy

## 2020-04-03 DIAGNOSIS — R293 Abnormal posture: Secondary | ICD-10-CM | POA: Diagnosis not present

## 2020-04-03 DIAGNOSIS — M25511 Pain in right shoulder: Secondary | ICD-10-CM

## 2020-04-03 NOTE — Therapy (Signed)
[facility] [address] [place], [place], [address] Phone: [phone]   Fax:  [phone]  Physical Therapy Treatment  Patient Details  Name: Joseph Larsen MRN: [id number] Date of Birth: Nov 17, 1946 Referring Provider (PT): Eugenia Mcalpine, MD   Encounter Date: 04/03/2020  PT End of Session - 04/03/20 1027    Visit Number  30    Number of Visits  34    Date for PT Re-Evaluation  05/08/20    Authorization Type  FOTO, Progress note every 10th visit, KX modifier at 15th visit /  20th visit 41%    PT Start Time  0901    PT Stop Time  0946    PT Time Calculation (min)  45 min    Activity Tolerance  Patient tolerated treatment well    Behavior During Therapy  [facility] for tasks assessed/performed       Past Medical History:  Diagnosis Date  . Abnormal ECG    a. 08/2012 Abnl ETT with ST changes in recovery;  b. 08/2012 Ex MV, ex time 10:00, EF 66%, no ischemia/infarct.  . Allergic rhinitis   . Anxiety   . Cataract   . Depression   . Diverticular disease   . Dyspnea on exertion   . ETOH abuse   . GERD (gastroesophageal reflux disease)   . Gout   . Hemorrhoids   . Hiatal hernia   . HTN (hypertension)   . Low testosterone   . MACROCYTIC ANEMIA   . NONSPEC ELEVATION OF LEVELS OF TRANSAMINASE/LDH   . Nonspecific abnormal finding in stool contents   . Palpitations   . PTSD (post-traumatic stress disorder)   . PTSD (post-traumatic stress disorder)    [place] [employer] - [employer]    Past Surgical History:  Procedure Laterality Date  . CHOLECYSTECTOMY  2004  . KNEE SURGERY Bilateral   . SHOULDER ARTHROSCOPY Right 01/14/2020    There were no vitals filed for this visit.  Subjective Assessment - 04/03/20 0952    Subjective  COVID-19 screen performed prior to patient entering clinic.  Doing good.    Patient is accompained by:  Family member    Limitations  Lifting;House hold activities    Patient Stated Goals  use arm again for home activities  and shop work.    Currently in Pain?  Yes    Pain Score  2     Pain Location  Shoulder    Pain Orientation  Right    Pain Descriptors / Indicators  Discomfort    Pain Onset  More than a month ago                       [facility] Adult PT Treatment/Exercise - 04/03/20 0001      Exercises   Exercises  Shoulder      Shoulder Exercises: Standing   Other Standing Exercises  RW4 with red theraband to fatigue all direction.      Shoulder Exercises: Pulleys   Flexion  5 minutes      Shoulder Exercises: ROM/Strengthening   UBE (Upper Arm Bike)  120 RPM's x 8 minutes.      IT consultant Action  IFC    Electrical Stimulation Parameters  80-150 Hz x 10 minutes.    Electrical Stimulation Goals  Pain      Manual Therapy   Manual Therapy  Passive ROM  Passive ROM  In supine:  PROM to patient's right shoulder into flexion and ER x 6 minutes.               PT Short Term Goals - 01/21/20 1332      PT SHORT TERM GOAL #1   Title  STG=LTG        PT Long Term Goals - 04/01/20 0916      PT LONG TERM GOAL #1   Title  Patient will be independent with HEP and its progression    Time  4    Period  Weeks    Status  On-going      PT LONG TERM GOAL #2   Title  Patient will demonstrate 140+ degrees of right shoulder flexion AROM to improve ability to perform overhead tasks.    Time  4    Period  Weeks    Status  Achieved   AROM 140 degrees 04/01/20     PT LONG TERM GOAL #3   Title  Patient will demonstate 55+ degrees of right shoulder ER AROM to improve ability to don/doff apparel.    Time  4    Period  Weeks    Status  Achieved   AROM 60 degrees 04/01/20     PT LONG TERM GOAL #4   Title  Patient will report ability to perform ADLs and home activities with right shoulder pain less than or equal to 4/10.    Time  4    Period  Weeks    Status  On-going      PT LONG TERM GOAL #5    Title  Patient will demonstrate 4/5 or greater right shoulder MMT in all planes to improve stability during functional tasks.    Time  4    Period  Weeks    Status  On-going   NT 04/01/20             Patient will benefit from skilled therapeutic intervention in order to improve the following deficits and impairments:     Visit Diagnosis: Acute pain of right shoulder  Abnormal posture     Problem List Patient Active Problem List   Diagnosis Date Noted  . Aortic atherosclerosis (HCC) 03/01/2018  . Barrett's esophagus 01/03/2017  . Non-healing ulcer (HCC) 10/13/2015  . Neoplasm of uncertain behavior of skin 10/08/2015  . Skin lesion 09/07/2015  . Hypokalemia 03/03/2014  . BPH (benign prostatic hyperplasia) 03/03/2014  . Chest pain   . Palpitations   . Dyspnea on exertion   . ETOH abuse   . PTSD (post-traumatic stress disorder) 10/02/2013  . Abnormal stress test 08/29/2012  . GERD (gastroesophageal reflux disease)   . HTN (hypertension)   . Anxiety   . Depression   . Allergic rhinitis   . Diverticular disease   . Hemorrhoids   . Hiatal hernia   . Gout   . MACROCYTIC ANEMIA 08/09/2010  . NONSPEC ELEVATION OF LEVELS OF TRANSAMINASE/LDH 08/09/2010  . NONSPECIFIC ABNORMAL FINDING IN STOOL CONTENTS 08/09/2010    Progress Note Reporting Period 01/21/20 to 04/03/20  See note below for Objective Data and Assessment of Progress/Goals. Patient doing very well and progressing toward goals per protocol guidelines.     Jaz Mallick, [place] MPT 04/03/2020, 10:44 AM  [facility] [address] [place], [place], [address] Phone: [phone]   Fax:  [phone]  Name: Joseph Larsen MRN: [id number] Date of Birth: 16-Feb-1946

## 2020-04-06 ENCOUNTER — Ambulatory Visit (INDEPENDENT_AMBULATORY_CARE_PROVIDER_SITE_OTHER): Payer: Medicare Other | Admitting: Cardiovascular Disease

## 2020-04-06 ENCOUNTER — Encounter: Payer: Self-pay | Admitting: Cardiovascular Disease

## 2020-04-06 ENCOUNTER — Other Ambulatory Visit: Payer: Self-pay

## 2020-04-06 ENCOUNTER — Ambulatory Visit: Payer: Medicare Other | Admitting: Physical Therapy

## 2020-04-06 ENCOUNTER — Encounter: Payer: Self-pay | Admitting: *Deleted

## 2020-04-06 VITALS — BP 110/70 | HR 69 | Ht 71.0 in | Wt 189.0 lb

## 2020-04-06 DIAGNOSIS — E785 Hyperlipidemia, unspecified: Secondary | ICD-10-CM

## 2020-04-06 DIAGNOSIS — I10 Essential (primary) hypertension: Secondary | ICD-10-CM | POA: Diagnosis not present

## 2020-04-06 DIAGNOSIS — R293 Abnormal posture: Secondary | ICD-10-CM

## 2020-04-06 DIAGNOSIS — M25511 Pain in right shoulder: Secondary | ICD-10-CM | POA: Diagnosis not present

## 2020-04-06 DIAGNOSIS — I480 Paroxysmal atrial fibrillation: Secondary | ICD-10-CM

## 2020-04-06 DIAGNOSIS — R002 Palpitations: Secondary | ICD-10-CM | POA: Diagnosis not present

## 2020-04-06 DIAGNOSIS — R0600 Dyspnea, unspecified: Secondary | ICD-10-CM | POA: Diagnosis not present

## 2020-04-06 NOTE — Progress Notes (Signed)
Patient ID: Joseph Larsen, male   DOB: 1946/06/25, 74 y.o.   MRN: [id number] Patient enrolled for [employer] to ship a 14 day ZIO XT long term holter monitor to his home.

## 2020-04-06 NOTE — Therapy (Signed)
[facility] [address] [place], [place], [address] Phone: [phone]   Fax:  [phone]  Physical Therapy Treatment  Patient Details  Name: Joseph Larsen MRN: [id number] Date of Birth: January 29, 1946 Referring Provider (PT): Eugenia Mcalpine, MD   Encounter Date: 04/06/2020  PT End of Session - 04/06/20 1548    Visit Number  31    Number of Visits  34    Date for PT Re-Evaluation  05/08/20    Authorization Type  FOTO, Progress note every 10th visit, KX modifier at 15th visit /  20th visit 41%    PT Start Time  0147    PT Stop Time  0240    PT Time Calculation (min)  53 min    Equipment Utilized During Treatment  Other (comment)    Activity Tolerance  Patient tolerated treatment well    Behavior During Therapy  [facility] for tasks assessed/performed       Past Medical History:  Diagnosis Date  . Abnormal ECG    a. 08/2012 Abnl ETT with ST changes in recovery;  b. 08/2012 Ex MV, ex time 10:00, EF 66%, no ischemia/infarct.  . Allergic rhinitis   . Anxiety   . Cataract   . Depression   . Diverticular disease   . Dyspnea on exertion   . ETOH abuse   . GERD (gastroesophageal reflux disease)   . Gout   . Hemorrhoids   . Hiatal hernia   . HTN (hypertension)   . Low testosterone   . MACROCYTIC ANEMIA   . NONSPEC ELEVATION OF LEVELS OF TRANSAMINASE/LDH   . Nonspecific abnormal finding in stool contents   . Palpitations   . PTSD (post-traumatic stress disorder)   . PTSD (post-traumatic stress disorder)    [place] [employer]    Past Surgical History:  Procedure Laterality Date  . CHOLECYSTECTOMY  2004  . KNEE SURGERY Bilateral   . SHOULDER ARTHROSCOPY Right 01/14/2020    There were no vitals filed for this visit.  Subjective Assessment - 04/06/20 1355    Subjective  COVID-19 screen performed prior to patient entering clinic.  Doing good.    Patient is accompained by:  Family member    Limitations  Lifting;House hold activities    Patient Stated Goals  use arm again for home activities and shop work.    Currently in Pain?  Yes    Pain Score  2     Pain Location  Shoulder    Pain Orientation  Right    Pain Descriptors / Indicators  Discomfort    Pain Type  Surgical pain    Pain Onset  More than a month ago                       [facility] Adult PT Treatment/Exercise - 04/06/20 0001      Exercises   Exercises  Shoulder      Shoulder Exercises: Sidelying   Other Sidelying Exercises  2# sdly ER to fatigue x 2.      Shoulder Exercises: Pulleys   Flexion  5 minutes    Other Pulley Exercises  UE ranger on wall x 3 minutes.        Shoulder Exercises: ROM/Strengthening   UBE (Upper Arm Bike)  90 RPM's x 8 minutes (4 minutes forward and 4 minutes backward).      Modalities   Modalities  Vasopneumatic      Vasopneumatic   Number Minutes  Vasopneumatic   15 minutes    Vasopnuematic Location   --   Right shoulder.   Vasopneumatic Pressure  Low      Manual Therapy   Manual Therapy  Passive ROM    Passive ROM  In supine:  PROM into right shoulder flexion and ER x 7 minutes.               PT Short Term Goals - 01/21/20 1332      PT SHORT TERM GOAL #1   Title  STG=LTG        PT Long Term Goals - 04/01/20 0916      PT LONG TERM GOAL #1   Title  Patient will be independent with HEP and its progression    Time  4    Period  Weeks    Status  On-going      PT LONG TERM GOAL #2   Title  Patient will demonstrate 140+ degrees of right shoulder flexion AROM to improve ability to perform overhead tasks.    Time  4    Period  Weeks    Status  Achieved   AROM 140 degrees 04/01/20     PT LONG TERM GOAL #3   Title  Patient will demonstate 55+ degrees of right shoulder ER AROM to improve ability to don/doff apparel.    Time  4    Period  Weeks    Status  Achieved   AROM 60 degrees 04/01/20     PT LONG TERM GOAL #4   Title  Patient will report ability to perform ADLs and home activities  with right shoulder pain less than or equal to 4/10.    Time  4    Period  Weeks    Status  On-going      PT LONG TERM GOAL #5   Title  Patient will demonstrate 4/5 or greater right shoulder MMT in all planes to improve stability during functional tasks.    Time  4    Period  Weeks    Status  On-going   NT 04/01/20           Plan - 04/06/20 1537    Clinical Impression Statement  Patient progressing very well.  Added sdly ER.  He lacking some right shoulder flexion but otherwise he is progressing toward goals.    Personal Factors and Comorbidities  Comorbidity 2    Comorbidities  right shoulder scope 01/14/2020, HTN    Examination-Activity Limitations  Bathing;Locomotion Level;Hygiene/Grooming;Dressing    Examination-Participation Restrictions  Driving    Stability/Clinical Decision Making  Stable/Uncomplicated    Rehab Potential  Good    PT Frequency  3x / week    PT Duration  4 weeks    PT Treatment/Interventions  ADLs/Self Care Home Management;Cryotherapy;Electrical Stimulation;Iontophoresis 4mg /ml Dexamethasone;Moist Heat;Ultrasound;Neuromuscular re-education;Therapeutic exercise;Therapeutic activities;Patient/family education;Functional mobility training;Manual techniques;Passive range of motion;Vasopneumatic Device;Taping    PT Next Visit Plan  Progress AAROM to AROM exercises per protocol, cont PROM to maintain ROM modalities PRN for pain relief    PT Home Exercise Plan  see patient education section    Consulted and Agree with Plan of Care  Patient       Patient will benefit from skilled therapeutic intervention in order to improve the following deficits and impairments:  Decreased activity tolerance, Decreased strength, Decreased range of motion, Pain, Postural dysfunction, Impaired UE functional use  Visit Diagnosis: Acute pain of right shoulder  Abnormal posture     Problem  List Patient Active Problem List   Diagnosis Date Noted  . Aortic atherosclerosis (HCC)  03/01/2018  . Barrett's esophagus 01/03/2017  . Non-healing ulcer (HCC) 10/13/2015  . Neoplasm of uncertain behavior of skin 10/08/2015  . Skin lesion 09/07/2015  . Hypokalemia 03/03/2014  . BPH (benign prostatic hyperplasia) 03/03/2014  . Chest pain   . Palpitations   . Dyspnea on exertion   . ETOH abuse   . PTSD (post-traumatic stress disorder) 10/02/2013  . Abnormal stress test 08/29/2012  . GERD (gastroesophageal reflux disease)   . HTN (hypertension)   . Anxiety   . Depression   . Allergic rhinitis   . Diverticular disease   . Hemorrhoids   . Hiatal hernia   . Gout   . MACROCYTIC ANEMIA 08/09/2010  . NONSPEC ELEVATION OF LEVELS OF TRANSAMINASE/LDH 08/09/2010  . NONSPECIFIC ABNORMAL FINDING IN STOOL CONTENTS 08/09/2010    Omaira Mellen, [place] MPT 04/06/2020, 3:52 PM  [facility] [address] [place], [place], [address] Phone: [phone]   Fax:  [phone]  Name: LAVANCE BRATZ MRN: [id number] Date of Birth: 1946/03/18

## 2020-04-06 NOTE — Patient Instructions (Addendum)
Medication Instructions:  Your physician has recommended you make the following change in your medication:  1-STOP Norvasc  *If you need a refill on your cardiac medications before your next appointment, please call your pharmacy*  Lab Work: If you have labs (blood work) drawn today and your tests are completely normal, you will receive your results only by: Marland Kitchen MyChart Message (if you have MyChart) OR . A paper copy in the mail If you have any lab test that is abnormal or we need to change your treatment, we will call you to review the results.  Testing/Procedures: Your physician has requested that you have an echocardiogram. Echocardiography is a painless test that uses sound waves to create images of your heart. It provides your doctor with information about the size and shape of your heart and how well your heart's chambers and valves are working. This procedure takes approximately one hour. There are no restrictions for this procedure.  ZIO XT- Long Term Monitor Instructions   Your physician has requested you wear your ZIO patch monitor__14___days.   This is a single patch monitor.  [employer] supplies one patch monitor per enrollment.  Additional stickers are not available.   Please do not apply patch if you will be having a Nuclear Stress Test, Echocardiogram, Cardiac CT, MRI, or Chest Xray during the time frame you would be wearing the monitor. The patch cannot be worn during these tests.  You cannot remove and re-apply the ZIO XT patch monitor.   Your ZIO patch monitor will be sent USPS Priority mail from [facility] directly to your home address. The monitor may also be mailed to a PO BOX if home delivery is not available.   It may take 3-5 days to receive your monitor after you have been enrolled.   Once you have received you monitor, please review enclosed instructions.  Your monitor has already been registered assigning a specific monitor serial # to you.   Applying the  monitor   Shave hair from upper left chest.   Hold abrader disc by orange tab.  Rub abrader in 40 strokes over left upper chest as indicated in your monitor instructions.   Clean area with 4 enclosed alcohol pads .  Use all pads to assure are is cleaned thoroughly.  Let dry.   Apply patch as indicated in monitor instructions.  Patch will be place under collarbone on left side of chest with arrow pointing upward.   Rub patch adhesive wings for 2 minutes.Remove white label marked "1".  Remove white label marked "2".  Rub patch adhesive wings for 2 additional minutes.   While looking in a mirror, press and release button in center of patch.  A small green light will flash 3-4 times .  This will be your only indicator the monitor has been turned on.     Do not shower for the first 24 hours.  You may shower after the first 24 hours.   Press button if you feel a symptom. You will hear a small click.  Record Date, Time and Symptom in the Patient Log Book.   When you are ready to remove patch, follow instructions on last 2 pages of Patient Log Book.  Stick patch monitor onto last page of Patient Log Book.   Place Patient Log Book in [place] box.  Use locking tab on box and tape box closed securely.  The [employer] has [employer] on it.  Please place in mailbox as  soon as possible.  Your physician should have your test results approximately 7 days after the monitor has been mailed back to [facility].   Call [facility] at [phone] if you have questions regarding your ZIO XT patch monitor.  Call them immediately if you see an orange light blinking on your monitor.   If your monitor falls off in less than 4 days contact our Monitor department at [phone].  If your monitor becomes loose or falls off after 4 days call [employer] at [phone] for suggestions on securing your monitor.    Follow-Up: At [facility], you and your health needs are our priority.   As part of our continuing mission to provide you with exceptional heart care, we have created designated Provider Care Teams.  These Care Teams include your primary Cardiologist (physician) and Advanced Practice Providers (APPs -  Physician Assistants and Nurse Practitioners) who all work together to provide you with the care you need, when you need it.  We recommend signing up for the patient portal called "MyChart".  Sign up information is provided on this After Visit Summary.  MyChart is used to connect with patients for Virtual Visits (Telemedicine).  Patients are able to view lab/test results, encounter notes, upcoming appointments, etc.  Non-urgent messages can be sent to your provider as well.   To learn more about what you can do with MyChart, go to [email or web address].    Your next appointment:   6 to 8 weeks  The format for your next appointment:   In Person  Provider:   You may see Jenkins Rouge, MD or one of the following Advanced Practice Providers on your designated Care Team:    Truitt Merle, NP  Cecilie Kicks, NP  Kathyrn Drown, NP

## 2020-04-08 ENCOUNTER — Ambulatory Visit: Payer: Medicare Other | Admitting: Physical Therapy

## 2020-04-08 ENCOUNTER — Encounter: Payer: Self-pay | Admitting: Physical Therapy

## 2020-04-08 ENCOUNTER — Other Ambulatory Visit: Payer: Self-pay

## 2020-04-08 DIAGNOSIS — M25511 Pain in right shoulder: Secondary | ICD-10-CM | POA: Diagnosis not present

## 2020-04-08 DIAGNOSIS — R293 Abnormal posture: Secondary | ICD-10-CM | POA: Diagnosis not present

## 2020-04-08 NOTE — Therapy (Signed)
[facility] [place], [place], [address] Phone: [phone]   Fax:  [phone]  Physical Therapy Treatment  Patient Details  Name: Joseph Larsen MRN: [id number] Date of Birth: 08/19/1946 Referring Provider (PT): Sydnee Cabal, MD   Encounter Date: 04/08/2020  PT End of Session - 04/08/20 0927    Visit Number  32    Number of Visits  34    Date for PT Re-Evaluation  05/08/20    Authorization Type  FOTO, Progress note every 10th visit, KX modifier at 15th visit /  20th visit 41%    PT Start Time  0900    PT Stop Time  0942    PT Time Calculation (min)  42 min    Activity Tolerance  Patient tolerated treatment well    Behavior During Therapy  [facility] for tasks assessed/performed       Past Medical History:  Diagnosis Date  . Abnormal ECG    a. 08/2012 Abnl ETT with ST changes in recovery;  b. 08/2012 Ex MV, ex time 10:00, EF 66%, no ischemia/infarct.  . Allergic rhinitis   . Anxiety   . Cataract   . Depression   . Diverticular disease   . Dyspnea on exertion   . ETOH abuse   . GERD (gastroesophageal reflux disease)   . Gout   . Hemorrhoids   . Hiatal hernia   . HTN (hypertension)   . Low testosterone   . MACROCYTIC ANEMIA   . NONSPEC ELEVATION OF LEVELS OF TRANSAMINASE/LDH   . Nonspecific abnormal finding in stool contents   . Palpitations   . PTSD (post-traumatic stress disorder)   . PTSD (post-traumatic stress disorder)    [name]    Past Surgical History:  Procedure Laterality Date  . CHOLECYSTECTOMY  2004  . KNEE SURGERY Bilateral   . SHOULDER ARTHROSCOPY Right 01/14/2020    There were no vitals filed for this visit.  Subjective Assessment - 04/08/20 0902    Subjective  COVID-19 screen performed prior to patient entering clinic.  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    Currently in Pain?  Yes    Pain Score  4     Pain Location  Shoulder    Pain Orientation  Right    Pain Descriptors / Indicators  Discomfort    Pain Type  Surgical pain    Pain Onset  More than a month ago    Pain Frequency  Intermittent    Aggravating Factors   increased movements    Pain Relieving Factors  at rest                       [facility] Adult PT Treatment/Exercise - 04/08/20 0001      Shoulder Exercises: Prone   Retraction  Strengthening;Right;20 reps;10 reps;Weights    Retraction Weight (lbs)  2    Extension  Strengthening;Right;20 reps;10 reps;Weights    Extension Weight (lbs)  2      Shoulder Exercises: Sidelying   Other Sidelying Exercises  2# sdly ER to fatigue x 2.      Shoulder Exercises: Standing   Protraction  Strengthening;Right;Theraband;20 reps    Theraband Level (Shoulder Protraction)  Level 2 (Red)    External Rotation  Strengthening;Right;20 reps;Theraband    Theraband  Level (Shoulder External Rotation)  Level 2 (Red)    Internal Rotation  Strengthening;Right;20 reps;Theraband    Theraband Level (Shoulder Internal Rotation)  Level 2 (Red)    Retraction  Strengthening;Right;20 reps;Theraband    Theraband Level (Shoulder Retraction)  Level 2 (Red)      Shoulder Exercises: Pulleys   Other Pulley Exercises  UE ranger on wall x 3 minutes.        Shoulder Exercises: ROM/Strengthening   UBE (Upper Arm Bike)  90 RPM's x 8 minutes (4 minutes forward and 4 minutes backward).    Other ROM/Strengthening Exercises  standing wall slide with eccentric lowering x20      Vasopneumatic   Number Minutes Vasopneumatic   15 minutes    Vasopnuematic Location   Shoulder    Vasopneumatic Pressure  Low    Vasopneumatic Temperature   34 for edema               PT Short Term Goals - 01/21/20 1332      PT SHORT TERM GOAL #1   Title  STG=LTG        PT Long Term Goals - 04/01/20 0916      PT LONG TERM GOAL #1   Title  Patient  will be independent with HEP and its progression    Time  4    Period  Weeks    Status  On-going      PT LONG TERM GOAL #2   Title  Patient will demonstrate 140+ degrees of right shoulder flexion AROM to improve ability to perform overhead tasks.    Time  4    Period  Weeks    Status  Achieved   AROM 140 degrees 04/01/20     PT LONG TERM GOAL #3   Title  Patient will demonstate 55+ degrees of right shoulder ER AROM to improve ability to don/doff apparel.    Time  4    Period  Weeks    Status  Achieved   AROM 60 degrees 04/01/20     PT LONG TERM GOAL #4   Title  Patient will report ability to perform ADLs and home activities with right shoulder pain less than or equal to 4/10.    Time  4    Period  Weeks    Status  On-going      PT LONG TERM GOAL #5   Title  Patient will demonstrate 4/5 or greater right shoulder MMT in all planes to improve stability during functional tasks.    Time  4    Period  Weeks    Status  On-going   NT 04/01/20           Plan - 04/08/20 0930    Clinical Impression Statement  Patient tolerated treatment well today. Patient able to progress with PRE's today. Patient has some soreness with ADL's today. Patient has full ROM this week. Patient goals ongoing due to strength and pain deficts.    Personal Factors and Comorbidities  Comorbidity 2    Comorbidities  right shoulder scope 01/14/2020, HTN    Examination-Activity Limitations  Bathing;Locomotion Level;Hygiene/Grooming;Dressing    Examination-Participation Restrictions  Driving    Stability/Clinical Decision Making  Stable/Uncomplicated    Rehab Potential  Good    PT Frequency  3x / week    PT Duration  4 weeks    PT Treatment/Interventions  ADLs/Self Care Home Management;Cryotherapy;Electrical Stimulation;Iontophoresis 4mg /ml Dexamethasone;Moist Heat;Ultrasound;Neuromuscular re-education;Therapeutic exercise;Therapeutic activities;Patient/family education;Functional mobility training;Manual  techniques;Passive range  of motion;Vasopneumatic Device;Taping    PT Next Visit Plan  Progress AAROM to AROM exercises per protocol, cont PROM to maintain ROM modalities PRN for pain relief    Consulted and Agree with Plan of Care  Patient       Patient will benefit from skilled therapeutic intervention in order to improve the following deficits and impairments:  Decreased activity tolerance, Decreased strength, Decreased range of motion, Pain, Postural dysfunction, Impaired UE functional use  Visit Diagnosis: Acute pain of right shoulder  Abnormal posture     Problem List Patient Active Problem List   Diagnosis Date Noted  . Aortic atherosclerosis ([place]) 03/01/2018  . Barrett's esophagus 01/03/2017  . Non-healing ulcer ([place]) 10/13/2015  . Neoplasm of uncertain behavior of skin 10/08/2015  . Skin lesion 09/07/2015  . Hypokalemia 03/03/2014  . BPH (benign prostatic hyperplasia) 03/03/2014  . Chest pain   . Palpitations   . Dyspnea on exertion   . ETOH abuse   . PTSD (post-traumatic stress disorder) 10/02/2013  . Abnormal stress test 08/29/2012  . GERD (gastroesophageal reflux disease)   . HTN (hypertension)   . Anxiety   . Depression   . Allergic rhinitis   . Diverticular disease   . Hemorrhoids   . Hiatal hernia   . Gout   . MACROCYTIC ANEMIA 08/09/2010  . NONSPEC ELEVATION OF LEVELS OF TRANSAMINASE/LDH 08/09/2010  . NONSPECIFIC ABNORMAL FINDING IN STOOL CONTENTS 08/09/2010    Phillips Climes, PTA 04/08/2020, 9:45 AM  [facility] [place], [place], [address] Phone: [phone]   Fax:  [phone]  Name: NIRVAAN CASTIGLIA MRN: [id number] Date of Birth: 15-Dec-1945

## 2020-04-08 NOTE — Therapy (Signed)
[facility] [place], [place], [address] Phone: [phone]   Fax:  [phone]  Physical Therapy Treatment  Patient Details  Name: Joseph Larsen MRN: [id number] Date of Birth: 1946/04/14 Referring Provider (PT): Sydnee Cabal, MD   Encounter Date: 04/08/2020  PT End of Session - 04/08/20 0927    Visit Number  32    Number of Visits  34    Date for PT Re-Evaluation  05/08/20    Authorization Type  FOTO, Progress note every 10th visit, KX modifier at 15th visit /  20th visit 41%    PT Start Time  0900    PT Stop Time  0942    PT Time Calculation (min)  42 min    Activity Tolerance  Patient tolerated treatment well    Behavior During Therapy  [facility] for tasks assessed/performed       Past Medical History:  Diagnosis Date  . Abnormal ECG    a. 08/2012 Abnl ETT with ST changes in recovery;  b. 08/2012 Ex MV, ex time 10:00, EF 66%, no ischemia/infarct.  . Allergic rhinitis   . Anxiety   . Cataract   . Depression   . Diverticular disease   . Dyspnea on exertion   . ETOH abuse   . GERD (gastroesophageal reflux disease)   . Gout   . Hemorrhoids   . Hiatal hernia   . HTN (hypertension)   . Low testosterone   . MACROCYTIC ANEMIA   . NONSPEC ELEVATION OF LEVELS OF TRANSAMINASE/LDH   . Nonspecific abnormal finding in stool contents   . Palpitations   . PTSD (post-traumatic stress disorder)   . PTSD (post-traumatic stress disorder)    [name]    Past Surgical History:  Procedure Laterality Date  . CHOLECYSTECTOMY  2004  . KNEE SURGERY Bilateral   . SHOULDER ARTHROSCOPY Right 01/14/2020    There were no vitals filed for this visit.  Subjective Assessment - 04/08/20 0902    Subjective  COVID-19 screen performed prior to patient entering clinic.  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    Currently in Pain?  Yes    Pain Score  4     Pain Location  Shoulder    Pain Orientation  Right    Pain Descriptors / Indicators  Discomfort    Pain Type  Surgical pain    Pain Onset  More than a month ago    Pain Frequency  Intermittent    Aggravating Factors   increased movements    Pain Relieving Factors  at rest                       [facility] Adult PT Treatment/Exercise - 04/08/20 0001      Shoulder Exercises: Prone   Retraction  Strengthening;Right;20 reps;10 reps;Weights    Retraction Weight (lbs)  2    Extension  Strengthening;Right;20 reps;10 reps;Weights    Extension Weight (lbs)  2      Shoulder Exercises: Sidelying   Other Sidelying Exercises  2# sdly ER to fatigue x 2.      Shoulder Exercises: Standing   Protraction  Strengthening;Right;Theraband;20 reps    Theraband Level (Shoulder Protraction)  Level 2 (Red)    External Rotation  Strengthening;Right;20 reps;Theraband    Theraband  Level (Shoulder External Rotation)  Level 2 (Red)    Internal Rotation  Strengthening;Right;20 reps;Theraband    Theraband Level (Shoulder Internal Rotation)  Level 2 (Red)    Retraction  Strengthening;Right;20 reps;Theraband    Theraband Level (Shoulder Retraction)  Level 2 (Red)      Shoulder Exercises: Pulleys   Other Pulley Exercises  UE ranger on wall x 3 minutes.        Shoulder Exercises: ROM/Strengthening   UBE (Upper Arm Bike)  90 RPM's x 8 minutes (4 minutes forward and 4 minutes backward).    Other ROM/Strengthening Exercises  standing wall slide with eccentric lowering x20      Vasopneumatic   Number Minutes Vasopneumatic   15 minutes    Vasopnuematic Location   Shoulder    Vasopneumatic Pressure  Low    Vasopneumatic Temperature   34 for edema               PT Short Term Goals - 01/21/20 1332      PT SHORT TERM GOAL #1   Title  STG=LTG        PT Long Term Goals - 04/01/20 0916      PT LONG TERM GOAL #1   Title  Patient  will be independent with HEP and its progression    Time  4    Period  Weeks    Status  On-going      PT LONG TERM GOAL #2   Title  Patient will demonstrate 140+ degrees of right shoulder flexion AROM to improve ability to perform overhead tasks.    Time  4    Period  Weeks    Status  Achieved   AROM 140 degrees 04/01/20     PT LONG TERM GOAL #3   Title  Patient will demonstate 55+ degrees of right shoulder ER AROM to improve ability to don/doff apparel.    Time  4    Period  Weeks    Status  Achieved   AROM 60 degrees 04/01/20     PT LONG TERM GOAL #4   Title  Patient will report ability to perform ADLs and home activities with right shoulder pain less than or equal to 4/10.    Time  4    Period  Weeks    Status  On-going      PT LONG TERM GOAL #5   Title  Patient will demonstrate 4/5 or greater right shoulder MMT in all planes to improve stability during functional tasks.    Time  4    Period  Weeks    Status  On-going   NT 04/01/20           Plan - 04/08/20 0930    Clinical Impression Statement  Patient tolerated treatment well today. Patient able to progress with PRE's today. Patient has some soreness with ADL's today. Patient has full ROM this week. Patient goals ongoing due to strength and pain deficts.    Personal Factors and Comorbidities  Comorbidity 2    Comorbidities  right shoulder scope 01/14/2020, HTN    Examination-Activity Limitations  Bathing;Locomotion Level;Hygiene/Grooming;Dressing    Examination-Participation Restrictions  Driving    Stability/Clinical Decision Making  Stable/Uncomplicated    Rehab Potential  Good    PT Frequency  3x / week    PT Duration  4 weeks    PT Treatment/Interventions  ADLs/Self Care Home Management;Cryotherapy;Electrical Stimulation;Iontophoresis 4mg /ml Dexamethasone;Moist Heat;Ultrasound;Neuromuscular re-education;Therapeutic exercise;Therapeutic activities;Patient/family education;Functional mobility training;Manual  techniques;Passive range  of motion;Vasopneumatic Device;Taping    PT Next Visit Plan  Progress AAROM to AROM exercises per protocol, cont PROM to maintain ROM / FOTO NEXT VISIT    Consulted and Agree with Plan of Care  Patient       Patient will benefit from skilled therapeutic intervention in order to improve the following deficits and impairments:  Decreased activity tolerance, Decreased strength, Decreased range of motion, Pain, Postural dysfunction, Impaired UE functional use  Visit Diagnosis: Acute pain of right shoulder  Abnormal posture     Problem List Patient Active Problem List   Diagnosis Date Noted  . Aortic atherosclerosis ([place]) 03/01/2018  . Barrett's esophagus 01/03/2017  . Non-healing ulcer ([place]) 10/13/2015  . Neoplasm of uncertain behavior of skin 10/08/2015  . Skin lesion 09/07/2015  . Hypokalemia 03/03/2014  . BPH (benign prostatic hyperplasia) 03/03/2014  . Chest pain   . Palpitations   . Dyspnea on exertion   . ETOH abuse   . PTSD (post-traumatic stress disorder) 10/02/2013  . Abnormal stress test 08/29/2012  . GERD (gastroesophageal reflux disease)   . HTN (hypertension)   . Anxiety   . Depression   . Allergic rhinitis   . Diverticular disease   . Hemorrhoids   . Hiatal hernia   . Gout   . MACROCYTIC ANEMIA 08/09/2010  . NONSPEC ELEVATION OF LEVELS OF TRANSAMINASE/LDH 08/09/2010  . NONSPECIFIC ABNORMAL FINDING IN STOOL CONTENTS 08/09/2010    Phillips Climes 04/08/2020, 9:47 AM  [facility] [place], [place], [address] Phone: [phone]   Fax:  [phone]  Name: Joseph Larsen MRN: [id number] Date of Birth: [birth year]

## 2020-04-09 ENCOUNTER — Ambulatory Visit (INDEPENDENT_AMBULATORY_CARE_PROVIDER_SITE_OTHER): Payer: Medicare Other

## 2020-04-09 DIAGNOSIS — I10 Essential (primary) hypertension: Secondary | ICD-10-CM | POA: Diagnosis not present

## 2020-04-09 DIAGNOSIS — I480 Paroxysmal atrial fibrillation: Secondary | ICD-10-CM | POA: Diagnosis not present

## 2020-04-09 DIAGNOSIS — R0600 Dyspnea, unspecified: Secondary | ICD-10-CM

## 2020-04-09 DIAGNOSIS — E785 Hyperlipidemia, unspecified: Secondary | ICD-10-CM

## 2020-04-09 DIAGNOSIS — R002 Palpitations: Secondary | ICD-10-CM | POA: Diagnosis not present

## 2020-04-10 ENCOUNTER — Other Ambulatory Visit: Payer: Self-pay

## 2020-04-10 ENCOUNTER — Ambulatory Visit: Payer: Medicare Other | Admitting: Physical Therapy

## 2020-04-10 DIAGNOSIS — R293 Abnormal posture: Secondary | ICD-10-CM | POA: Diagnosis not present

## 2020-04-10 DIAGNOSIS — M25511 Pain in right shoulder: Secondary | ICD-10-CM | POA: Diagnosis not present

## 2020-04-10 NOTE — Therapy (Signed)
Hiatal hernia   . Gout   . MACROCYTIC ANEMIA 08/09/2010  . NONSPEC ELEVATION OF LEVELS OF TRANSAMINASE/LDH 08/09/2010  . NONSPECIFIC ABNORMAL FINDING IN STOOL CONTENTS 08/09/2010    Valla Pacey, [place] MPT 04/10/2020, 10:05 AM  [facility] [place], [place], [address] Phone: [phone]   Fax:  [phone]  Name: Joseph Larsen MRN: [id number] Date of Birth: 11/13/1946  [facility] [place], [place], [address] Phone: [phone]   Fax:  [phone]  Physical Therapy Treatment  Patient Details  Name: Joseph Larsen MRN: [id number] Date of Birth: 09-19-1946 Referring Provider (PT): Sydnee Cabal, MD   Encounter Date: 04/10/2020  PT End of Session - 04/10/20 1004    Visit Number  33    Number of Visits  34    Date for PT Re-Evaluation  05/08/20    Authorization Type  FOTO, Progress note every 10th visit, KX modifier at 15th visit /  20th visit 41%    PT Start Time  0900    PT Stop Time  0957    PT Time Calculation (min)  57 min    Activity Tolerance  Patient tolerated treatment well    Behavior During Therapy  [facility] for tasks assessed/performed       Past Medical History:  Diagnosis Date  . Abnormal ECG    a. 08/2012 Abnl ETT with ST changes in recovery;  b. 08/2012 Ex MV, ex time 10:00, EF 66%, no ischemia/infarct.  . Allergic rhinitis   . Anxiety   . Cataract   . Depression   . Diverticular disease   . Dyspnea on exertion   . ETOH abuse   . GERD (gastroesophageal reflux disease)   . Gout   . Hemorrhoids   . Hiatal hernia   . HTN (hypertension)   . Low testosterone   . MACROCYTIC ANEMIA   . NONSPEC ELEVATION OF LEVELS OF TRANSAMINASE/LDH   . Nonspecific abnormal finding in stool contents   . Palpitations   . PTSD (post-traumatic stress disorder)   . PTSD (post-traumatic stress disorder)    [name]    Past Surgical History:  Procedure Laterality Date  . CHOLECYSTECTOMY  2004  . KNEE SURGERY Bilateral   . SHOULDER ARTHROSCOPY Right 01/14/2020    There were no vitals filed for this visit.                    [facility] Adult PT Treatment/Exercise - 04/10/20 0001      Exercises   Exercises  Shoulder      Shoulder Exercises: Sidelying   Other Sidelying Exercises  2# SDLY ER to fatigue x 2.      Shoulder Exercises: Standing   Other Standing  Exercises  2# into right shoulder flexion, full can, abduction and bicep curls to fatigue.       Shoulder Exercises: Pulleys   Flexion  5 minutes    Other Pulley Exercises  Ball on wall x 3 minutes.      Shoulder Exercises: ROM/Strengthening   UBE (Upper Arm Bike)  90 RPM's x 10 minutes.      Modalities   Modalities  Vasopneumatic      Vasopneumatic   Number Minutes Vasopneumatic   15 minutes    Vasopnuematic Location   --   Right shoulder.   Vasopneumatic Pressure  Low      Manual Therapy   Manual Therapy  Passive ROM    Passive ROM  In supine:  PROM into right shoulder ER x 5 minutes.               PT Short Term Goals - 01/21/20 1332      PT SHORT TERM GOAL #1   Title  STG=LTG        PT Long Term Goals - 04/01/20 [id number]  Hiatal hernia   . Gout   . MACROCYTIC ANEMIA 08/09/2010  . NONSPEC ELEVATION OF LEVELS OF TRANSAMINASE/LDH 08/09/2010  . NONSPECIFIC ABNORMAL FINDING IN STOOL CONTENTS 08/09/2010    Valla Pacey, [place] MPT 04/10/2020, 10:05 AM  [facility] [place], [place], [address] Phone: [phone]   Fax:  [phone]  Name: Joseph Larsen MRN: [id number] Date of Birth: 11/13/1946

## 2020-04-13 ENCOUNTER — Encounter: Payer: Self-pay | Admitting: Family Medicine

## 2020-04-13 ENCOUNTER — Ambulatory Visit (INDEPENDENT_AMBULATORY_CARE_PROVIDER_SITE_OTHER): Payer: Medicare Other | Admitting: Family Medicine

## 2020-04-13 ENCOUNTER — Other Ambulatory Visit: Payer: Self-pay

## 2020-04-13 VITALS — BP 138/88 | HR 75 | Temp 98.3°F | Ht 71.0 in | Wt 189.0 lb

## 2020-04-13 DIAGNOSIS — K22710 Barrett's esophagus with low grade dysplasia: Secondary | ICD-10-CM | POA: Diagnosis not present

## 2020-04-13 DIAGNOSIS — E876 Hypokalemia: Secondary | ICD-10-CM | POA: Diagnosis not present

## 2020-04-13 DIAGNOSIS — R3914 Feeling of incomplete bladder emptying: Secondary | ICD-10-CM

## 2020-04-13 DIAGNOSIS — I10 Essential (primary) hypertension: Secondary | ICD-10-CM | POA: Diagnosis not present

## 2020-04-13 DIAGNOSIS — I4891 Unspecified atrial fibrillation: Secondary | ICD-10-CM | POA: Diagnosis not present

## 2020-04-13 DIAGNOSIS — N401 Enlarged prostate with lower urinary tract symptoms: Secondary | ICD-10-CM | POA: Diagnosis not present

## 2020-04-13 DIAGNOSIS — I700 Atherosclerosis of aorta: Secondary | ICD-10-CM

## 2020-04-13 DIAGNOSIS — R5383 Other fatigue: Secondary | ICD-10-CM | POA: Diagnosis not present

## 2020-04-13 DIAGNOSIS — Z7689 Persons encountering health services in other specified circumstances: Secondary | ICD-10-CM | POA: Diagnosis not present

## 2020-04-13 NOTE — Progress Notes (Signed)
Subjective: CC: Establish care, atrial fibrillation, low energy PCP: [name], DO Joseph Larsen is a 74 y.o. male presenting to clinic today for:  1.  Atrial fibrillation, hypertension Patient reports compliance with losartan 100 mg daily, atenolol 25 mg and Eliquis 5 mg twice daily.  No chest pain, shortness of breath, hematochezia or melena.  Medications are actually managed by his provider at the [place].  He does admit to low energy.  Uses benzodiazepine extremely rarely and only at half a tablet at bedtime.  He is also treated with Seroquel.   ROS: Per HPI  Allergies  Allergen Reactions  . Terazosin Rash    Rash when in sun  . Enalapril Maleate     REACTION: rash  . Enalapril Maleate Rash    REACTION: rash  . Lisinopril Rash and Itching  . Penicillins Rash and Itching    REACTION: rash, itching REACTION: rash, itching   Past Medical History:  Diagnosis Date  . Abnormal ECG    a. 08/2012 Abnl ETT with ST changes in recovery;  b. 08/2012 Ex MV, ex time 10:00, EF 66%, no ischemia/infarct.  . Allergic rhinitis   . Anxiety   . Cataract   . Depression   . Diverticular disease   . Dyspnea on exertion   . ETOH abuse   . GERD (gastroesophageal reflux disease)   . Gout   . Hemorrhoids   . Hiatal hernia   . HTN (hypertension)   . Low testosterone   . MACROCYTIC ANEMIA   . NONSPEC ELEVATION OF LEVELS OF TRANSAMINASE/LDH   . Nonspecific abnormal finding in stool contents   . Palpitations   . PTSD (post-traumatic stress disorder)   . PTSD (post-traumatic stress disorder)    [name]    Current Outpatient Medications:  .  ALPRAZolam (XANAX) 0.5 MG tablet, Take 0.5 mg by mouth 3 (three) times daily as needed. Outside provider, Disp: , Rfl:  .  apixaban (ELIQUIS) 5 MG TABS tablet, Take 1 tablet (5 mg total) by mouth 2 (two) times daily., Disp: 60 tablet, Rfl: 0 .  atenolol (TENORMIN) 25 MG tablet, Take 1 tablet (25 mg total) by mouth 2 (two) times daily.,  Disp: 60 tablet, Rfl: 3 .  cholecalciferol (VITAMIN D) 1000 UNITS tablet, Take 1,000 Units by mouth daily., Disp: , Rfl:  .  hydrochlorothiazide (HYDRODIURIL) 25 MG tablet, Take 25 mg by mouth daily. , Disp: , Rfl:  .  losartan (COZAAR) 100 MG tablet, Take 50 mg by mouth daily., Disp: , Rfl:  .  Misc Natural Products (OSTEO BI-FLEX/5-LOXIN ADVANCED PO), Take 2 tablets by mouth daily. , Disp: , Rfl:  .  Omega-3 Fatty Acids (FISH OIL) 1000 MG CAPS, Take 4 capsules by mouth daily. , Disp: , Rfl:   Current Facility-Administered Medications:  .  cyanocobalamin ((VITAMIN B-12)) injection 1,000 mcg, 1,000 mcg, Intramuscular, Q30 days, Timmothy Euler, MD, 1,000 mcg at 02/13/18 0932 Social History   Socioeconomic History  . Marital status: Married    Spouse name: Joseph Larsen  . Number of children: 1  . Years of education: Not on file  . Highest education level: Not on file  Occupational History  . Occupation: disabled    Employer: RETIRED    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  Tobacco Use  . Smoking status: Former Smoker    Packs/day: 3.00    Types: Cigarettes    Start date: 02/09/1965    Quit date: 05/12/1982  Years since quitting: 37.9  . Smokeless tobacco: Never Used  Substance and Sexual Activity  . Alcohol use: Yes    Alcohol/week: 14.0 standard drinks    Types: 14 Cans of beer per week    Comment: drinks 2-3 cans of beer/day    . Drug use: No    Comment: 1 cup of decaf coffee/day.  Marland Kitchen Sexual activity: Yes  Other Topics Concern  . Not on file  Social History Narrative   Lives with wife in [place] in a one story home.  Exercises @ gym 3x/wk - stationary bike and light weight training.   Social Determinants of Health   Financial Resource Strain:   . Difficulty of Paying Living Expenses:   Food Insecurity:   . Worried About Charity fundraiser in the Last Year:   . Arboriculturist in the Last Year:   Transportation Needs:   . Film/video editor (Medical):   Marland Kitchen  Lack of Transportation (Non-Medical):   Physical Activity:   . Days of Exercise per Week:   . Minutes of Exercise per Session:   Stress:   . Feeling of Stress :   Social Connections:   . Frequency of Communication with Friends and Family:   . Frequency of Social Gatherings with Friends and Family:   . Attends Religious Services:   . Active Member of Clubs or Organizations:   . Attends Archivist Meetings:   Marland Kitchen Marital Status:   Intimate Partner Violence:   . Fear of Current or Ex-Partner:   . Emotionally Abused:   Marland Kitchen Physically Abused:   . Sexually Abused:    Family History  Problem Relation Age of Onset  . Heart disease Mother        "died of chf"  . Heart disease Father        "died of old age"  . Hypertension Sister   . Liver disease Brother   . Hypertension Brother   . Hypertension Sister   . Hypertension Sister   . Hypertension Sister   . Heart murmur Sister   . Colon cancer Neg Hx     Objective: Office vital signs reviewed. BP 138/88   Pulse 75   Temp 98.3 F (36.8 C) (Temporal)   Ht 5' 11"  (1.803 m)   Wt 189 lb (85.7 kg)   SpO2 97%   BMI 26.36 kg/m   Physical Examination:  General: Awake, alert, well nourished, No acute distress HEENT: Normal, sclera white, MMM; no goiter Cardio: irregularly irregular, S1S2 heard, no murmurs appreciated; Zio patch on left side of chest. Pulm: clear to auscultation bilaterally, no wheezes, rhonchi or rales; normal work of breathing on room air Extremities: warm, well perfused, No edema, cyanosis or clubbing; +2 pulses bilaterally MSK: normal gait and station Psych: Mood stable, speech appropriate, pleasant and interactive  Depression screen [facility] 2/9 04/13/2020 01/07/2019 12/21/2018  Decreased Interest 0 0 0  Down, Depressed, Hopeless 0 0 0  PHQ - 2 Score 0 0 0  Altered sleeping - 0 -  Tired, decreased energy - 0 -  Change in appetite - 0 -  Feeling bad or failure about yourself  - 0 -  Trouble concentrating - 0  -  Moving slowly or fidgety/restless - 0 -  Suicidal thoughts - 0 -  PHQ-9 Score - 0 -  Difficult doing work/chores - Not difficult at all -     Assessment/ Plan: 74 y.o. male   1. Atrial fibrillation,  unspecified type ([place]) Irregularly irregular but rate controlled. No red flags. Zio patch on - CBC  2. Essential hypertension Controlled. Check fasting labs - Lipid Panel - CMP14+EGFR - TSH  3. Aortic atherosclerosis (HCC) - Lipid Panel - CMP14+EGFR - TSH  4. Barrett's esophagus with low grade dysplasia - CBC  5. Benign prostatic hyperplasia with incomplete bladder emptying - PSA  6. Establishing care with new doctor, encounter for  7. Hypokalemia - Magnesium - CMP14+EGFR  8. Low energy Uncertain etiology.  ?medication induced.  Check for anemia, metabolic disturbance. - Testosterone,Free and Total   No orders of the defined types were placed in this encounter.  No orders of the defined types were placed in this encounter.    [name], DO [place] [phone]

## 2020-04-13 NOTE — Patient Instructions (Signed)

## 2020-04-14 ENCOUNTER — Telehealth: Payer: Self-pay | Admitting: Family Medicine

## 2020-04-14 ENCOUNTER — Other Ambulatory Visit: Payer: Self-pay | Admitting: Family Medicine

## 2020-04-14 DIAGNOSIS — E876 Hypokalemia: Secondary | ICD-10-CM

## 2020-04-14 MED ORDER — POTASSIUM CHLORIDE CRYS ER 20 MEQ PO TBCR: 40.0000 meq | EXTENDED_RELEASE_TABLET | Freq: Every day | ORAL | 0 refills | Status: DC

## 2020-04-14 NOTE — Telephone Encounter (Signed)
LM to call back for appt - jhb 04/14/20

## 2020-04-15 ENCOUNTER — Other Ambulatory Visit: Payer: Self-pay

## 2020-04-15 ENCOUNTER — Encounter: Payer: Self-pay | Admitting: Physical Therapy

## 2020-04-15 ENCOUNTER — Ambulatory Visit: Payer: Medicare Other | Attending: Specialist | Admitting: Physical Therapy

## 2020-04-15 DIAGNOSIS — M25511 Pain in right shoulder: Secondary | ICD-10-CM | POA: Diagnosis not present

## 2020-04-15 DIAGNOSIS — R293 Abnormal posture: Secondary | ICD-10-CM | POA: Insufficient documentation

## 2020-04-15 LAB — CMP14+EGFR
ALT: 31 IU/L (ref 0–44)
AST: 32 IU/L (ref 0–40)
Albumin/Globulin Ratio: 1.9 (ref 1.2–2.2)
Albumin: 4.1 g/dL (ref 3.7–4.7)
Alkaline Phosphatase: 55 IU/L (ref 39–117)
BUN/Creatinine Ratio: 11 (ref 10–24)
BUN: 9 mg/dL (ref 8–27)
Bilirubin Total: 0.5 mg/dL (ref 0.0–1.2)
CO2: 24 mmol/L (ref 20–29)
Calcium: 8.9 mg/dL (ref 8.6–10.2)
Chloride: 99 mmol/L (ref 96–106)
Creatinine, Ser: 0.8 mg/dL (ref 0.76–1.27)
GFR calc Af Amer: 103 mL/min/{1.73_m2} (ref >59)
GFR calc non Af Amer: 89 mL/min/{1.73_m2} (ref >59)
Globulin, Total: 2.2 g/dL (ref 1.5–4.5)
Glucose: 79 mg/dL (ref 65–99)
Potassium: 3.1 mmol/L — ABNORMAL LOW (ref 3.5–5.2)
Sodium: 141 mmol/L (ref 134–144)
Total Protein: 6.3 g/dL (ref 6.0–8.5)

## 2020-04-15 LAB — LIPID PANEL
Chol/HDL Ratio: 3.5 ratio (ref 0.0–5.0)
Cholesterol, Total: 98 mg/dL — ABNORMAL LOW (ref 100–199)
HDL: 28 mg/dL — ABNORMAL LOW (ref >39)
LDL Chol Calc (NIH): 55 mg/dL (ref 0–99)
Triglycerides: 68 mg/dL (ref 0–149)
VLDL Cholesterol Cal: 15 mg/dL (ref 5–40)

## 2020-04-15 LAB — MAGNESIUM: Magnesium: 1.5 mg/dL — ABNORMAL LOW (ref 1.6–2.3)

## 2020-04-15 LAB — TSH: TSH: 2.56 u[IU]/mL (ref 0.450–4.500)

## 2020-04-15 LAB — CBC
Hematocrit: 47.9 % (ref 37.5–51.0)
Hemoglobin: 16.9 g/dL (ref 13.0–17.7)
MCH: 33.7 pg — ABNORMAL HIGH (ref 26.6–33.0)
MCHC: 35.3 g/dL (ref 31.5–35.7)
MCV: 95 fL (ref 79–97)
Platelets: 159 10*3/uL (ref 150–450)
RBC: 5.02 x10E6/uL (ref 4.14–5.80)
RDW: 13.2 % (ref 11.6–15.4)
WBC: 5.8 10*3/uL (ref 3.4–10.8)

## 2020-04-15 LAB — TESTOSTERONE,FREE AND TOTAL
Testosterone, Free: 4.9 pg/mL — ABNORMAL LOW (ref 6.6–18.1)
Testosterone: 430 ng/dL (ref 264–916)

## 2020-04-15 LAB — PSA: Prostate Specific Ag, Serum: 0.8 ng/mL (ref 0.0–4.0)

## 2020-04-15 NOTE — Therapy (Signed)
[facility] [place], [place], [address] Phone: [phone]   Fax:  [phone]  Physical Therapy Treatment  Patient Details  Name: Joseph Larsen MRN: [id number] Date of Birth: 03-25-1946 Referring Provider (PT): Sydnee Cabal, MD   Encounter Date: 04/15/2020  PT End of Session - 04/15/20 1034    Visit Number  34    Number of Visits  34    Date for PT Re-Evaluation  05/08/20    Authorization Type  FOTO, Progress note every 10th visit, KX modifier at 15th visit /  20th visit 41%    PT Start Time  0902    PT Stop Time  0946    PT Time Calculation (min)  44 min    Activity Tolerance  Patient tolerated treatment well    Behavior During Therapy  [facility] for tasks assessed/performed       Past Medical History:  Diagnosis Date  . Abnormal ECG    a. 08/2012 Abnl ETT with ST changes in recovery;  b. 08/2012 Ex MV, ex time 10:00, EF 66%, no ischemia/infarct.  . Allergic rhinitis   . Anxiety   . Cataract   . Depression   . Diverticular disease   . Dyspnea on exertion   . ETOH abuse   . GERD (gastroesophageal reflux disease)   . Gout   . Hemorrhoids   . Hiatal hernia   . HTN (hypertension)   . Low testosterone   . MACROCYTIC ANEMIA   . NONSPEC ELEVATION OF LEVELS OF TRANSAMINASE/LDH   . Nonspecific abnormal finding in stool contents   . Palpitations   . PTSD (post-traumatic stress disorder)   . PTSD (post-traumatic stress disorder)    [name]    Past Surgical History:  Procedure Laterality Date  . CHOLECYSTECTOMY  2004  . KNEE SURGERY Bilateral   . SHOULDER ARTHROSCOPY Right 01/14/2020    There were no vitals filed for this visit.  Subjective Assessment - 04/15/20 0901    Subjective  COVID-19 screen performed prior to patient entering clinic. No pain upon arrival but certain activities cause pain intermittantly. Has some stiffness with mowing his yard with zero turn lawnmower.    Limitations  Lifting;House  hold activities    Patient Stated Goals  use arm again for home activities and shop work.    Currently in Pain?  No/denies         [facility] PT Assessment - 04/15/20 0001      Assessment   Medical Diagnosis  Encounter for other orthopedic aftercare    Referring Provider (PT)  Sydnee Cabal, MD    Onset Date/Surgical Date  01/14/20    Next MD Visit  04/2020      Precautions   Precautions  Shoulder    Type of Shoulder Precautions  R shoulder scope, possible RTC and biceps tenodesis                   [facility] Adult PT Treatment/Exercise - 04/15/20 0001      Shoulder Exercises: Standing   Protraction  Strengthening;Right;Theraband;20 reps    Theraband Level (Shoulder Protraction)  Level 2 (Red)    Protraction Limitations  x30 reps    External Rotation  Strengthening;Right;Weights    Theraband Level (Shoulder External Rotation)  Level 2 (Red)    External Rotation Limitations  x30 reps    Internal Rotation  Strengthening;Right;Theraband    Theraband Level (Shoulder Internal Rotation)  Level 2 (Red)  Internal Rotation Limitations  x30 reps    Flexion  Strengthening;Right;20 reps;Weights    Shoulder Flexion Weight (lbs)  2    ABduction  Strengthening;Right;20 reps;Weights    Shoulder ABduction Weight (lbs)  2    Extension  Strengthening;Right;Theraband    Theraband Level (Shoulder Extension)  Level 2 (Red)    Extension Limitations  x30 reps    Diagonals  AROM;Right;10 reps    Other Standing Exercises  2# R shoulder scaption x20 reps      Shoulder Exercises: Pulleys   Flexion  5 minutes      Shoulder Exercises: ROM/Strengthening   UBE (Upper Arm Bike)  120 RPM x6 min      Modalities   Modalities  Vasopneumatic      Vasopneumatic   Number Minutes Vasopneumatic   15 minutes    Vasopnuematic Location   Shoulder    Vasopneumatic Pressure  Low    Vasopneumatic Temperature   34 for edema               PT Short Term Goals - 01/21/20 1332      PT SHORT TERM  GOAL #1   Title  STG=LTG        PT Long Term Goals - 04/01/20 0916      PT LONG TERM GOAL #1   Title  Patient will be independent with HEP and its progression    Time  4    Period  Weeks    Status  On-going      PT LONG TERM GOAL #2   Title  Patient will demonstrate 140+ degrees of right shoulder flexion AROM to improve ability to perform overhead tasks.    Time  4    Period  Weeks    Status  Achieved   AROM 140 degrees 04/01/20     PT LONG TERM GOAL #3   Title  Patient will demonstate 55+ degrees of right shoulder ER AROM to improve ability to don/doff apparel.    Time  4    Period  Weeks    Status  Achieved   AROM 60 degrees 04/01/20     PT LONG TERM GOAL #4   Title  Patient will report ability to perform ADLs and home activities with right shoulder pain less than or equal to 4/10.    Time  4    Period  Weeks    Status  On-going      PT LONG TERM GOAL #5   Title  Patient will demonstrate 4/5 or greater right shoulder MMT in all planes to improve stability during functional tasks.    Time  4    Period  Weeks    Status  On-going   NT 04/01/20           Plan - 04/15/20 1031    Clinical Impression Statement  Patient presented in clinic with reports of only intermittant pain with certain activities. Patient guided through more R shoulder strengthening exercises with only report of fatigue. Clicking at [facility] joint noted with resisted protraction strengthening and discomfort reported with AROM D2 exercise. Normal vasopneumatic response noted following removal of the modality.    Personal Factors and Comorbidities  Comorbidity 2    Comorbidities  right shoulder scope 01/14/2020, HTN    Examination-Activity Limitations  Bathing;Locomotion Level;Hygiene/Grooming;Dressing    Examination-Participation Restrictions  Driving    Stability/Clinical Decision Making  Stable/Uncomplicated    Rehab Potential  Good    PT Frequency  3x / week    PT Duration  4 weeks    PT  Treatment/Interventions  ADLs/Self Care Home Management;Cryotherapy;Electrical Stimulation;Iontophoresis 4mg /ml Dexamethasone;Moist Heat;Ultrasound;Neuromuscular re-education;Therapeutic exercise;Therapeutic activities;Patient/family education;Functional mobility training;Manual techniques;Passive range of motion;Vasopneumatic Device;Taping    PT Next Visit Plan  Progress strengthening and AROM exercises as tolerated.    PT Home Exercise Plan  see patient education section    Consulted and Agree with Plan of Care  Patient       Patient will benefit from skilled therapeutic intervention in order to improve the following deficits and impairments:  Decreased activity tolerance, Decreased strength, Decreased range of motion, Pain, Postural dysfunction, Impaired UE functional use  Visit Diagnosis: Acute pain of right shoulder  Abnormal posture     Problem List Patient Active Problem List   Diagnosis Date Noted  . Aortic atherosclerosis ([place]) 03/01/2018  . Barrett's esophagus 01/03/2017  . Non-healing ulcer ([place]) 10/13/2015  . Neoplasm of uncertain behavior of skin 10/08/2015  . Skin lesion 09/07/2015  . Hypokalemia 03/03/2014  . BPH (benign prostatic hyperplasia) 03/03/2014  . Chest pain   . Palpitations   . Dyspnea on exertion   . ETOH abuse   . PTSD (post-traumatic stress disorder) 10/02/2013  . Abnormal stress test 08/29/2012  . GERD (gastroesophageal reflux disease)   . HTN (hypertension)   . Anxiety   . Depression   . Allergic rhinitis   . Diverticular disease   . Hemorrhoids   . Hiatal hernia   . Gout   . MACROCYTIC ANEMIA 08/09/2010  . NONSPEC ELEVATION OF LEVELS OF TRANSAMINASE/LDH 08/09/2010  . NONSPECIFIC ABNORMAL FINDING IN STOOL CONTENTS 08/09/2010    Standley Brooking, PTA 04/15/20 10:35 AM   [facility] [place], [place], [address] Phone: [phone]   Fax:  [phone]  Name: Joseph Larsen MRN:  [id number] Date of Birth: [birth year]

## 2020-04-16 NOTE — Telephone Encounter (Signed)
Aware of all results.

## 2020-04-17 ENCOUNTER — Other Ambulatory Visit: Payer: Self-pay

## 2020-04-17 ENCOUNTER — Ambulatory Visit: Payer: Medicare Other | Admitting: Physical Therapy

## 2020-04-17 ENCOUNTER — Encounter: Payer: Self-pay | Admitting: Physical Therapy

## 2020-04-17 DIAGNOSIS — R293 Abnormal posture: Secondary | ICD-10-CM | POA: Diagnosis not present

## 2020-04-17 DIAGNOSIS — M25511 Pain in right shoulder: Secondary | ICD-10-CM

## 2020-04-17 NOTE — Therapy (Signed)
[facility] [place], [place], [address] Phone: [phone]   Fax:  [phone]  Physical Therapy Treatment  Patient Details  Name: Joseph Larsen MRN: [id number] Date of Birth: March 01, 1946 Referring Provider (PT): Sydnee Cabal, MD   Encounter Date: 04/17/2020  PT End of Session - 04/17/20 1008    Visit Number  35    Number of Visits  40    Date for PT Re-Evaluation  05/08/20    Authorization Type  FOTO, Progress note every 10th visit, KX modifier at 15th visit /  20th visit 41%    PT Start Time  0900    PT Stop Time  0948    PT Time Calculation (min)  48 min    Activity Tolerance  Patient tolerated treatment well    Behavior During Therapy  [facility] for tasks assessed/performed       Past Medical History:  Diagnosis Date  . Abnormal ECG    a. 08/2012 Abnl ETT with ST changes in recovery;  b. 08/2012 Ex MV, ex time 10:00, EF 66%, no ischemia/infarct.  . Allergic rhinitis   . Anxiety   . Cataract   . Depression   . Diverticular disease   . Dyspnea on exertion   . ETOH abuse   . GERD (gastroesophageal reflux disease)   . Gout   . Hemorrhoids   . Hiatal hernia   . HTN (hypertension)   . Low testosterone   . MACROCYTIC ANEMIA   . NONSPEC ELEVATION OF LEVELS OF TRANSAMINASE/LDH   . Nonspecific abnormal finding in stool contents   . Palpitations   . PTSD (post-traumatic stress disorder)   . PTSD (post-traumatic stress disorder)    [name]    Past Surgical History:  Procedure Laterality Date  . CHOLECYSTECTOMY  2004  . KNEE SURGERY Bilateral   . SHOULDER ARTHROSCOPY Right 01/14/2020    There were no vitals filed for this visit.  Subjective Assessment - 04/17/20 1009    Subjective  COVID-19 screen performed prior to patient entering clinic. Patient reports doing well at home but is still fearful with performing jerking motions for starting up his lawn care machinery.    Patient is accompained by:  Family  member    Limitations  Lifting;House hold activities    Patient Stated Goals  use arm again for home activities and shop work.    Currently in Pain?  No/denies         [facility] PT Assessment - 04/17/20 0001      Assessment   Medical Diagnosis  Encounter for other orthopedic aftercare    Referring Provider (PT)  Sydnee Cabal, MD    Onset Date/Surgical Date  01/14/20    Next MD Visit  04/2020      Precautions   Precautions  Shoulder    Type of Shoulder Precautions  R shoulder scope, possible RTC and biceps tenodesis                   [facility] Adult PT Treatment/Exercise - 04/17/20 0001      Exercises   Exercises  Shoulder      Shoulder Exercises: Standing   Protraction  Strengthening;Right;Theraband;20 reps    Theraband Level (Shoulder Protraction)  Level 2 (Red)    Protraction Limitations  x30 reps    External Rotation  Strengthening;Right;Weights    Theraband Level (Shoulder External Rotation)  Level 2 (Red)    External Rotation Limitations  x30 reps  Internal Rotation  Strengthening;Right;Theraband    Theraband Level (Shoulder Internal Rotation)  Level 2 (Red)    Internal Rotation Limitations  x30 reps    Flexion  Strengthening;Right;20 reps;Weights    Shoulder Flexion Weight (lbs)  2    Extension  Strengthening;Right;Theraband    Theraband Level (Shoulder Extension)  Level 2 (Red)    Extension Limitations  x30 reps    Diagonals  AROM;Right;15 reps    Theraband Level (Shoulder Diagonals)  Level 1 (Yellow)    Diagonals Limitations  D1 and D2 flexion     Other Standing Exercises  scapular wall clocks yellow theraband      Shoulder Exercises: Pulleys   Flexion  5 minutes      Shoulder Exercises: ROM/Strengthening   UBE (Upper Arm Bike)  120 RPM x6 min      Modalities   Modalities  Vasopneumatic      Vasopneumatic   Number Minutes Vasopneumatic   15 minutes    Vasopnuematic Location   Shoulder    Vasopneumatic Pressure  Low    Vasopneumatic Temperature    34 for edema               PT Short Term Goals - 01/21/20 1332      PT SHORT TERM GOAL #1   Title  STG=LTG        PT Long Term Goals - 04/01/20 0916      PT LONG TERM GOAL #1   Title  Patient will be independent with HEP and its progression    Time  4    Period  Weeks    Status  On-going      PT LONG TERM GOAL #2   Title  Patient will demonstrate 140+ degrees of right shoulder flexion AROM to improve ability to perform overhead tasks.    Time  4    Period  Weeks    Status  Achieved   AROM 140 degrees 04/01/20     PT LONG TERM GOAL #3   Title  Patient will demonstate 55+ degrees of right shoulder ER AROM to improve ability to don/doff apparel.    Time  4    Period  Weeks    Status  Achieved   AROM 60 degrees 04/01/20     PT LONG TERM GOAL #4   Title  Patient will report ability to perform ADLs and home activities with right shoulder pain less than or equal to 4/10.    Time  4    Period  Weeks    Status  On-going      PT LONG TERM GOAL #5   Title  Patient will demonstrate 4/5 or greater right shoulder MMT in all planes to improve stability during functional tasks.    Time  4    Period  Weeks    Status  On-going   NT 04/01/20           Plan - 04/17/20 0956    Clinical Impression Statement  Patient responded well to therapy session but with reports of fatigue. Patient demonstrated good form with resisted D1 and D2 flexion. Patient provided with intermittent cues to relax shoulder with good carryover. No adverse affects upon removal.    Personal Factors and Comorbidities  Comorbidity 2    Comorbidities  right shoulder scope 01/14/2020, HTN    Examination-Activity Limitations  Bathing;Locomotion Level;Hygiene/Grooming;Dressing    Examination-Participation Restrictions  Driving    Stability/Clinical Decision Making  Stable/Uncomplicated  Clinical Decision Making  Low    Rehab Potential  Good    PT Frequency  3x / week    PT Duration  4 weeks    PT  Treatment/Interventions  ADLs/Self Care Home Management;Cryotherapy;Electrical Stimulation;Iontophoresis 4mg /ml Dexamethasone;Moist Heat;Ultrasound;Neuromuscular re-education;Therapeutic exercise;Therapeutic activities;Patient/family education;Functional mobility training;Manual techniques;Passive range of motion;Vasopneumatic Device;Taping    PT Next Visit Plan  Progress strengthening and AROM exercises as tolerated.    PT Home Exercise Plan  see patient education section    Consulted and Agree with Plan of Care  Patient       Patient will benefit from skilled therapeutic intervention in order to improve the following deficits and impairments:  Decreased activity tolerance, Decreased strength, Decreased range of motion, Pain, Postural dysfunction, Impaired UE functional use  Visit Diagnosis: Acute pain of right shoulder  Abnormal posture     Problem List Patient Active Problem List   Diagnosis Date Noted  . Aortic atherosclerosis ([place]) 03/01/2018  . Barrett's esophagus 01/03/2017  . Non-healing ulcer ([place]) 10/13/2015  . Neoplasm of uncertain behavior of skin 10/08/2015  . Skin lesion 09/07/2015  . Hypokalemia 03/03/2014  . BPH (benign prostatic hyperplasia) 03/03/2014  . Chest pain   . Palpitations   . Dyspnea on exertion   . ETOH abuse   . PTSD (post-traumatic stress disorder) 10/02/2013  . Abnormal stress test 08/29/2012  . GERD (gastroesophageal reflux disease)   . HTN (hypertension)   . Anxiety   . Depression   . Allergic rhinitis   . Diverticular disease   . Hemorrhoids   . Hiatal hernia   . Gout   . MACROCYTIC ANEMIA 08/09/2010  . NONSPEC ELEVATION OF LEVELS OF TRANSAMINASE/LDH 08/09/2010  . NONSPECIFIC ABNORMAL FINDING IN STOOL CONTENTS 08/09/2010    Gabriela Eves, PT, DPT 04/17/2020, 10:10 AM  [facility] [place], [place], [address] Phone: [phone]   Fax:  [phone]  Name: Joseph Larsen MRN: [id number] Date of Birth: 14-Mar-1946

## 2020-04-21 ENCOUNTER — Other Ambulatory Visit: Payer: Medicare Other

## 2020-04-21 ENCOUNTER — Ambulatory Visit: Payer: Medicare Other | Admitting: Physical Therapy

## 2020-04-21 ENCOUNTER — Other Ambulatory Visit: Payer: Self-pay

## 2020-04-21 DIAGNOSIS — E876 Hypokalemia: Secondary | ICD-10-CM | POA: Diagnosis not present

## 2020-04-22 ENCOUNTER — Encounter: Payer: Self-pay | Admitting: Physical Therapy

## 2020-04-22 ENCOUNTER — Ambulatory Visit: Payer: Medicare Other | Admitting: Physical Therapy

## 2020-04-22 ENCOUNTER — Other Ambulatory Visit: Payer: Self-pay

## 2020-04-22 DIAGNOSIS — M25511 Pain in right shoulder: Secondary | ICD-10-CM

## 2020-04-22 DIAGNOSIS — R293 Abnormal posture: Secondary | ICD-10-CM

## 2020-04-22 LAB — BASIC METABOLIC PANEL
BUN/Creatinine Ratio: 14 (ref 10–24)
BUN: 13 mg/dL (ref 8–27)
CO2: 26 mmol/L (ref 20–29)
Calcium: 9.4 mg/dL (ref 8.6–10.2)
Chloride: 100 mmol/L (ref 96–106)
Creatinine, Ser: 0.9 mg/dL (ref 0.76–1.27)
GFR calc Af Amer: 98 mL/min/{1.73_m2} (ref >59)
GFR calc non Af Amer: 85 mL/min/{1.73_m2} (ref >59)
Glucose: 81 mg/dL (ref 65–99)
Potassium: 3.6 mmol/L (ref 3.5–5.2)
Sodium: 143 mmol/L (ref 134–144)

## 2020-04-22 LAB — MAGNESIUM: Magnesium: 1.6 mg/dL (ref 1.6–2.3)

## 2020-04-22 NOTE — Therapy (Signed)
[facility] [place], [place], [address] Phone: [phone]   Fax:  [phone]  Physical Therapy Treatment  Patient Details  Name: Joseph Larsen MRN: [id number] Date of Birth: 01/24/1946 Referring Provider (PT): Sydnee Cabal, MD   Encounter Date: 04/22/2020  PT End of Session - 04/22/20 0903    Visit Number  36    Number of Visits  40    Date for PT Re-Evaluation  05/08/20    Authorization Type  FOTO, Progress note every 10th visit, KX modifier at 15th visit /  20th visit 41%    PT Start Time  0900    PT Stop Time  0945    PT Time Calculation (min)  45 min    Activity Tolerance  Patient tolerated treatment well    Behavior During Therapy  [facility] for tasks assessed/performed       Past Medical History:  Diagnosis Date  . Abnormal ECG    a. 08/2012 Abnl ETT with ST changes in recovery;  b. 08/2012 Ex MV, ex time 10:00, EF 66%, no ischemia/infarct.  . Allergic rhinitis   . Anxiety   . Cataract   . Depression   . Diverticular disease   . Dyspnea on exertion   . ETOH abuse   . GERD (gastroesophageal reflux disease)   . Gout   . Hemorrhoids   . Hiatal hernia   . HTN (hypertension)   . Low testosterone   . MACROCYTIC ANEMIA   . NONSPEC ELEVATION OF LEVELS OF TRANSAMINASE/LDH   . Nonspecific abnormal finding in stool contents   . Palpitations   . PTSD (post-traumatic stress disorder)   . PTSD (post-traumatic stress disorder)    [name]    Past Surgical History:  Procedure Laterality Date  . CHOLECYSTECTOMY  2004  . KNEE SURGERY Bilateral   . SHOULDER ARTHROSCOPY Right 01/14/2020    There were no vitals filed for this visit.  Subjective Assessment - 04/22/20 0902    Subjective  COVID 19 screening performed on patient upon arrival. Patient reports he is afraid to pull much with arm. Reports that still has pain in certain motions.    Limitations  Lifting;House hold activities    Patient Stated Goals   use arm again for home activities and shop work.    Currently in Pain?  No/denies         [facility] PT Assessment - 04/22/20 0001      Assessment   Medical Diagnosis  Encounter for other orthopedic aftercare    Referring Provider (PT)  Sydnee Cabal, MD    Onset Date/Surgical Date  01/14/20    Next MD Visit  04/2020      Precautions   Precautions  Shoulder    Type of Shoulder Precautions  R shoulder scope, possible RTC and biceps tenodesis                   [facility] Adult PT Treatment/Exercise - 04/22/20 0001      Shoulder Exercises: Sidelying   External Rotation  Strengthening;Right;20 reps;Weights    External Rotation Weight (lbs)  2      Shoulder Exercises: Standing   Flexion  Strengthening;Right;20 reps;Weights    Shoulder Flexion Weight (lbs)  2    Extension  Strengthening;Right;20 reps;Weights    Extension Weight (lbs)  3    Row  Strengthening;Right;20 reps;Weights    Row Weight (lbs)  3    Diagonals  Strengthening;Right;20 reps;Weights  Diagonals Weight (lbs)  2    Other Standing Exercises  2# R shoulder scaption x20 reps      Shoulder Exercises: Pulleys   Flexion  5 minutes      Shoulder Exercises: ROM/Strengthening   Other ROM/Strengthening Exercises  R shoulder wall clock 4# ball x5 reps (12-5)      Modalities   Modalities  Vasopneumatic      Vasopneumatic   Number Minutes Vasopneumatic   10 minutes    Vasopnuematic Location   Shoulder    Vasopneumatic Pressure  Low    Vasopneumatic Temperature   34 for edema      Manual Therapy   Manual Therapy  Passive ROM    Passive ROM  PROM of R shoulder into flexion, ER with holds at end range               PT Short Term Goals - 01/21/20 1332      PT SHORT TERM GOAL #1   Title  STG=LTG        PT Long Term Goals - 04/01/20 0916      PT LONG TERM GOAL #1   Title  Patient will be independent with HEP and its progression    Time  4    Period  Weeks    Status  On-going      PT LONG  TERM GOAL #2   Title  Patient will demonstrate 140+ degrees of right shoulder flexion AROM to improve ability to perform overhead tasks.    Time  4    Period  Weeks    Status  Achieved   AROM 140 degrees 04/01/20     PT LONG TERM GOAL #3   Title  Patient will demonstate 55+ degrees of right shoulder ER AROM to improve ability to don/doff apparel.    Time  4    Period  Weeks    Status  Achieved   AROM 60 degrees 04/01/20     PT LONG TERM GOAL #4   Title  Patient will report ability to perform ADLs and home activities with right shoulder pain less than or equal to 4/10.    Time  4    Period  Weeks    Status  On-going      PT LONG TERM GOAL #5   Title  Patient will demonstrate 4/5 or greater right shoulder MMT in all planes to improve stability during functional tasks.    Time  4    Period  Weeks    Status  On-going   NT 04/01/20           Plan - 04/22/20 0947    Clinical Impression Statement  Patient presented in clinic with only reports of discomfort with certain movements and fear of pulling. Patient continues to experience intermittant clicking of R AC joint as well during strengthening session. Muscle fatigue notable during RUE strengthening session. Firm end feels and smooth arc of motion noted during PROM in all directions. Normal vasopneumatic response noted following removal of the modality.    Personal Factors and Comorbidities  Comorbidity 2    Comorbidities  right shoulder scope 01/14/2020, HTN    Examination-Activity Limitations  Bathing;Locomotion Level;Hygiene/Grooming;Dressing    Examination-Participation Restrictions  Driving    Stability/Clinical Decision Making  Stable/Uncomplicated    Rehab Potential  Good    PT Frequency  3x / week    PT Duration  4 weeks    PT Treatment/Interventions  ADLs/Self Care  Home Management;Cryotherapy;Electrical Stimulation;Iontophoresis 4mg /ml Dexamethasone;Moist Heat;Ultrasound;Neuromuscular re-education;Therapeutic  exercise;Therapeutic activities;Patient/family education;Functional mobility training;Manual techniques;Passive range of motion;Vasopneumatic Device;Taping    PT Next Visit Plan  Progress strengthening and AROM exercises as tolerated.    PT Home Exercise Plan  see patient education section    Consulted and Agree with Plan of Care  Patient       Patient will benefit from skilled therapeutic intervention in order to improve the following deficits and impairments:  Decreased activity tolerance, Decreased strength, Decreased range of motion, Pain, Postural dysfunction, Impaired UE functional use  Visit Diagnosis: Acute pain of right shoulder  Abnormal posture     Problem List Patient Active Problem List   Diagnosis Date Noted  . Aortic atherosclerosis ([place]) 03/01/2018  . Barrett's esophagus 01/03/2017  . Non-healing ulcer ([place]) 10/13/2015  . Neoplasm of uncertain behavior of skin 10/08/2015  . Skin lesion 09/07/2015  . Hypokalemia 03/03/2014  . BPH (benign prostatic hyperplasia) 03/03/2014  . Chest pain   . Palpitations   . Dyspnea on exertion   . ETOH abuse   . PTSD (post-traumatic stress disorder) 10/02/2013  . Abnormal stress test 08/29/2012  . GERD (gastroesophageal reflux disease)   . HTN (hypertension)   . Anxiety   . Depression   . Allergic rhinitis   . Diverticular disease   . Hemorrhoids   . Hiatal hernia   . Gout   . MACROCYTIC ANEMIA 08/09/2010  . NONSPEC ELEVATION OF LEVELS OF TRANSAMINASE/LDH 08/09/2010  . NONSPECIFIC ABNORMAL FINDING IN STOOL CONTENTS 08/09/2010    Standley Brooking, PTA 04/22/2020, 9:54 AM  [facility] [address] [place], [place], [address] Phone: [phone]   Fax:  [phone]  Name: ELIC ALBERTI MRN: [id number] Date of Birth: Dec 21, 1945

## 2020-04-23 ENCOUNTER — Ambulatory Visit (HOSPITAL_COMMUNITY): Payer: Medicare Other | Attending: Cardiovascular Disease

## 2020-04-23 DIAGNOSIS — R002 Palpitations: Secondary | ICD-10-CM | POA: Diagnosis not present

## 2020-04-23 DIAGNOSIS — I480 Paroxysmal atrial fibrillation: Secondary | ICD-10-CM | POA: Diagnosis not present

## 2020-04-23 DIAGNOSIS — I10 Essential (primary) hypertension: Secondary | ICD-10-CM

## 2020-04-23 DIAGNOSIS — R0600 Dyspnea, unspecified: Secondary | ICD-10-CM | POA: Insufficient documentation

## 2020-04-23 DIAGNOSIS — E785 Hyperlipidemia, unspecified: Secondary | ICD-10-CM | POA: Insufficient documentation

## 2020-04-24 ENCOUNTER — Ambulatory Visit: Payer: Medicare Other | Admitting: *Deleted

## 2020-04-24 ENCOUNTER — Other Ambulatory Visit: Payer: Self-pay

## 2020-04-24 DIAGNOSIS — R293 Abnormal posture: Secondary | ICD-10-CM | POA: Diagnosis not present

## 2020-04-24 DIAGNOSIS — M25511 Pain in right shoulder: Secondary | ICD-10-CM

## 2020-04-24 NOTE — Therapy (Signed)
[facility] [address] [place], [place], [address] Phone: [phone]   Fax:  [phone]  Physical Therapy Treatment  Patient Details  Name: SHAKER SARKER MRN: [id number] Date of Birth: Jun 20, 1946 Referring Provider (PT): Eugenia Mcalpine, MD   Encounter Date: 04/24/2020  PT End of Session - 04/24/20 1257    Visit Number  37    Number of Visits  40    Date for PT Re-Evaluation  05/08/20    Authorization Type  FOTO, Progress note every 10th visit, KX modifier at 15th visit /  20th visit 41%    PT Start Time  0900    PT Stop Time  1950    PT Time Calculation (min)  650 min       Past Medical History:  Diagnosis Date  . Abnormal ECG    a. 08/2012 Abnl ETT with ST changes in recovery;  b. 08/2012 Ex MV, ex time 10:00, EF 66%, no ischemia/infarct.  . Allergic rhinitis   . Anxiety   . Cataract   . Depression   . Diverticular disease   . Dyspnea on exertion   . ETOH abuse   . GERD (gastroesophageal reflux disease)   . Gout   . Hemorrhoids   . Hiatal hernia   . HTN (hypertension)   . Low testosterone   . MACROCYTIC ANEMIA   . NONSPEC ELEVATION OF LEVELS OF TRANSAMINASE/LDH   . Nonspecific abnormal finding in stool contents   . Palpitations   . PTSD (post-traumatic stress disorder)   . PTSD (post-traumatic stress disorder)    [place] V[employer] - [employer]    Past Surgical History:  Procedure Laterality Date  . CHOLECYSTECTOMY  2004  . KNEE SURGERY Bilateral   . SHOULDER ARTHROSCOPY Right 01/14/2020    There were no vitals filed for this visit.  Subjective Assessment - 04/24/20 0917    Subjective  COVID 19 screening performed on patient upon arrival. Patient reports he is afraid to pull much with arm. Reports that still has pain in certain motions. 3/10 today    Limitations  Lifting;House hold activities    Patient Stated Goals  use arm again for home activities and shop work.    Pain Score  3     Pain Location  Shoulder    Pain  Orientation  Right    Pain Descriptors / Indicators  Discomfort    Pain Onset  More than a month ago                        [facility] Adult PT Treatment/Exercise - 04/24/20 0001      Exercises   Exercises  Shoulder      Shoulder Exercises: Standing   Flexion  Strengthening;Right;10 reps   5 x 10   Shoulder Flexion Weight (lbs)  1    Extension  Strengthening;Right;20 reps;Weights;10 reps    Extension Weight (lbs)  3    Row  Strengthening;Right;20 reps;Weights;10 reps    Row Weight (lbs)  3    Shoulder Elevation  Strengthening;Right;10 reps;Standing   scaption   Other Standing Exercises  bicep curl 3# x 30      Shoulder Exercises: Pulleys   Flexion  5 minutes      Shoulder Exercises: ROM/Strengthening   UBE (Upper Arm Bike)  90  RPM x6 min      Modalities   Modalities  Vasopneumatic      Vasopneumatic   Number Minutes Vasopneumatic  10 minutes    Vasopnuematic Location   Shoulder    Vasopneumatic Pressure  Low    Vasopneumatic Temperature   34 for edema      Manual Therapy   Manual Therapy  Passive ROM    Passive ROM  PROM of R shoulder into flexion, ER with holds at end range               PT Short Term Goals - 01/21/20 1332      PT SHORT TERM GOAL #1   Title  STG=LTG        PT Long Term Goals - 04/01/20 0916      PT LONG TERM GOAL #1   Title  Patient will be independent with HEP and its progression    Time  4    Period  Weeks    Status  On-going      PT LONG TERM GOAL #2   Title  Patient will demonstrate 140+ degrees of right shoulder flexion AROM to improve ability to perform overhead tasks.    Time  4    Period  Weeks    Status  Achieved   AROM 140 degrees 04/01/20     PT LONG TERM GOAL #3   Title  Patient will demonstate 55+ degrees of right shoulder ER AROM to improve ability to don/doff apparel.    Time  4    Period  Weeks    Status  Achieved   AROM 60 degrees 04/01/20     PT LONG TERM GOAL #4   Title  Patient will  report ability to perform ADLs and home activities with right shoulder pain less than or equal to 4/10.    Time  4    Period  Weeks    Status  On-going      PT LONG TERM GOAL #5   Title  Patient will demonstrate 4/5 or greater right shoulder MMT in all planes to improve stability during functional tasks.    Time  4    Period  Weeks    Status  On-going   NT 04/01/20           Plan - 04/24/20 1258    Clinical Impression Statement  Pt arrived today doing fairly well with cc being a clicking / popping in RT shldr when he raises it. Pt was guided through strengthening exs today and did well. AROM flexion in standing to 145 degrees and ER to 70 degrees.    Comorbidities  right shoulder scope 01/14/2020, HTN    Examination-Activity Limitations  Bathing;Locomotion Level;Hygiene/Grooming;Dressing    Rehab Potential  Good    PT Frequency  3x / week    PT Duration  4 weeks    PT Treatment/Interventions  ADLs/Self Care Home Management;Cryotherapy;Electrical Stimulation;Iontophoresis 4mg /ml Dexamethasone;Moist Heat;Ultrasound;Neuromuscular re-education;Therapeutic exercise;Therapeutic activities;Patient/family education;Functional mobility training;Manual techniques;Passive range of motion;Vasopneumatic Device;Taping    PT Next Visit Plan  Progress strengthening and AROM exercises as tolerated.       Patient will benefit from skilled therapeutic intervention in order to improve the following deficits and impairments:     Visit Diagnosis: Acute pain of right shoulder  Abnormal posture     Problem List Patient Active Problem List   Diagnosis Date Noted  . Aortic atherosclerosis ([facility]) 03/01/2018  . Barrett's esophagus 01/03/2017  . Non-healing ulcer ([facility]) 10/13/2015  . Neoplasm of uncertain behavior of skin 10/08/2015  . Skin lesion 09/07/2015  . Hypokalemia 03/03/2014  . BPH (benign prostatic  hyperplasia) 03/03/2014  . Chest pain   . Palpitations   . Dyspnea on exertion   . ETOH  abuse   . PTSD (post-traumatic stress disorder) 10/02/2013  . Abnormal stress test 08/29/2012  . GERD (gastroesophageal reflux disease)   . HTN (hypertension)   . Anxiety   . Depression   . Allergic rhinitis   . Diverticular disease   . Hemorrhoids   . Hiatal hernia   . Gout   . MACROCYTIC ANEMIA 08/09/2010  . NONSPEC ELEVATION OF LEVELS OF TRANSAMINASE/LDH 08/09/2010  . NONSPECIFIC ABNORMAL FINDING IN STOOL CONTENTS 08/09/2010    Aziya Arena,CHRIS , PTA 04/24/2020, 1:04 PM  [facility] [address] [place], [place], [address] Phone: [phone]   Fax:  [phone]  Name: SLADER VASCONEZ MRN: [id number] Date of Birth: 1946/03/12

## 2020-04-28 ENCOUNTER — Ambulatory Visit: Payer: Medicare Other | Admitting: Physical Therapy

## 2020-04-28 ENCOUNTER — Encounter: Payer: Self-pay | Admitting: Physical Therapy

## 2020-04-28 ENCOUNTER — Other Ambulatory Visit: Payer: Self-pay

## 2020-04-28 DIAGNOSIS — M25511 Pain in right shoulder: Secondary | ICD-10-CM

## 2020-04-28 DIAGNOSIS — R293 Abnormal posture: Secondary | ICD-10-CM

## 2020-04-28 NOTE — Therapy (Signed)
[facility] [place], [place], [address] Phone: [phone]   Fax:  [phone]  Physical Therapy Treatment  Patient Details  Name: Joseph Larsen MRN: [id number] Date of Birth: Jul 16, 1946 Referring Provider (PT): Sydnee Cabal, MD   Encounter Date: 04/28/2020  PT End of Session - 04/28/20 0740    Visit Number  38    Number of Visits  40    Date for PT Re-Evaluation  05/08/20    Authorization Type  FOTO, Progress note every 10th visit, KX modifier at 15th visit /  20th visit 41%    PT Start Time  0730    PT Stop Time  0818    PT Time Calculation (min)  48 min    Activity Tolerance  Patient tolerated treatment well    Behavior During Therapy  [facility] for tasks assessed/performed       Past Medical History:  Diagnosis Date  . Abnormal ECG    a. 08/2012 Abnl ETT with ST changes in recovery;  b. 08/2012 Ex MV, ex time 10:00, EF 66%, no ischemia/infarct.  . Allergic rhinitis   . Anxiety   . Cataract   . Depression   . Diverticular disease   . Dyspnea on exertion   . ETOH abuse   . GERD (gastroesophageal reflux disease)   . Gout   . Hemorrhoids   . Hiatal hernia   . HTN (hypertension)   . Low testosterone   . MACROCYTIC ANEMIA   . NONSPEC ELEVATION OF LEVELS OF TRANSAMINASE/LDH   . Nonspecific abnormal finding in stool contents   . Palpitations   . PTSD (post-traumatic stress disorder)   . PTSD (post-traumatic stress disorder)    [name]    Past Surgical History:  Procedure Laterality Date  . CHOLECYSTECTOMY  2004  . KNEE SURGERY Bilateral   . SHOULDER ARTHROSCOPY Right 01/14/2020    There were no vitals filed for this visit.  Subjective Assessment - 04/28/20 0740    Subjective  COVID 19 screening performed on patient upon arrival. No complaints upon arrival.    Limitations  Lifting;House hold activities    Patient Stated Goals  use arm again for home activities and shop work.    Currently in Pain?   No/denies         [facility] PT Assessment - 04/28/20 0001      Assessment   Medical Diagnosis  Encounter for other orthopedic aftercare    Referring Provider (PT)  Sydnee Cabal, MD    Onset Date/Surgical Date  01/14/20    Next MD Visit  04/2020      Precautions   Precautions  Shoulder    Type of Shoulder Precautions  R shoulder scope, possible RTC and biceps tenodesis                    [facility] Adult PT Treatment/Exercise - 04/28/20 0001      Shoulder Exercises: Supine   Protraction  Strengthening;Right;20 reps;10 reps;Weights    Protraction Weight (lbs)  4      Shoulder Exercises: Seated   Horizontal ABduction  Strengthening;Both;20 reps;Theraband    Theraband Level (Shoulder Horizontal ABduction)  Level 2 (Red)      Shoulder Exercises: Sidelying   External Rotation  Strengthening;Right;20 reps;10 reps;Weights    External Rotation Weight (lbs)  2      Shoulder Exercises: Standing   Flexion  Strengthening;Right;20 reps;10 reps;Weights    Shoulder Flexion Weight (lbs)  2    ABduction  Strengthening;Right;20 reps;10 reps;Weights    Shoulder ABduction Weight (lbs)  2    Extension  Strengthening;Right;20 reps;10 reps;Weights    Extension Weight (lbs)  4    Row  Strengthening;Right;20 reps;10 reps;Weights    Row Weight (lbs)  4    Diagonals  Strengthening;Right;15 reps;Theraband    Theraband Level (Shoulder Diagonals)  Level 2 (Red)    Other Standing Exercises  2# R shoulder scaption x20 reps    Other Standing Exercises  bicep curl 4# x 30      Shoulder Exercises: ROM/Strengthening   UBE (Upper Arm Bike)  90 RPM x8 min    Wall Wash  CW and CCW x fatigue each   more discomfort with CCW     Modalities   Modalities  Vasopneumatic      Vasopneumatic   Number Minutes Vasopneumatic   10 minutes    Vasopnuematic Location   Shoulder    Vasopneumatic Pressure  Low    Vasopneumatic Temperature   34 for edema      Manual Therapy   Manual Therapy  Passive ROM     Passive ROM  PROM of R shoulder into flexion, ER with holds at end range               PT Short Term Goals - 01/21/20 1332      PT SHORT TERM GOAL #1   Title  STG=LTG        PT Long Term Goals - 04/01/20 0916      PT LONG TERM GOAL #1   Title  Patient will be independent with HEP and its progression    Time  4    Period  Weeks    Status  On-going      PT LONG TERM GOAL #2   Title  Patient will demonstrate 140+ degrees of right shoulder flexion AROM to improve ability to perform overhead tasks.    Time  4    Period  Weeks    Status  Achieved   AROM 140 degrees 04/01/20     PT LONG TERM GOAL #3   Title  Patient will demonstate 55+ degrees of right shoulder ER AROM to improve ability to don/doff apparel.    Time  4    Period  Weeks    Status  Achieved   AROM 60 degrees 04/01/20     PT LONG TERM GOAL #4   Title  Patient will report ability to perform ADLs and home activities with right shoulder pain less than or equal to 4/10.    Time  4    Period  Weeks    Status  On-going      PT LONG TERM GOAL #5   Title  Patient will demonstrate 4/5 or greater right shoulder MMT in all planes to improve stability during functional tasks.    Time  4    Period  Weeks    Status  On-going   NT 04/01/20           Plan - 04/28/20 [id number]    Clinical Impression Statement  Patient presented in clinic with reports of R shoulder fatigue during treatment as well as R shoulder clicking. Patient limited with reps intermittantly during treatment due to muscle fatigue. Firm end feels and smooth arc of motion noted during PROM of R shoulder. Normal vasopnuematic response noted following removal of the modality.    Personal Factors and Comorbidities  Comorbidity 2  Comorbidities  right shoulder scope 01/14/2020, HTN    Examination-Activity Limitations  Bathing;Locomotion Level;Hygiene/Grooming;Dressing    Examination-Participation Restrictions  Driving    Stability/Clinical Decision  Making  Stable/Uncomplicated    Rehab Potential  Good    PT Frequency  3x / week    PT Duration  4 weeks    PT Treatment/Interventions  ADLs/Self Care Home Management;Cryotherapy;Electrical Stimulation;Iontophoresis 4mg /ml Dexamethasone;Moist Heat;Ultrasound;Neuromuscular re-education;Therapeutic exercise;Therapeutic activities;Patient/family education;Functional mobility training;Manual techniques;Passive range of motion;Vasopneumatic Device;Taping    PT Next Visit Plan  Progress strengthening and AROM exercises as tolerated.    PT Home Exercise Plan  see patient education section    Consulted and Agree with Plan of Care  Patient       Patient will benefit from skilled therapeutic intervention in order to improve the following deficits and impairments:  Decreased activity tolerance, Decreased strength, Decreased range of motion, Pain, Postural dysfunction, Impaired UE functional use  Visit Diagnosis: Acute pain of right shoulder  Abnormal posture     Problem List Patient Active Problem List   Diagnosis Date Noted  . Aortic atherosclerosis ([place]) 03/01/2018  . Barrett's esophagus 01/03/2017  . Non-healing ulcer ([place]) 10/13/2015  . Neoplasm of uncertain behavior of skin 10/08/2015  . Skin lesion 09/07/2015  . Hypokalemia 03/03/2014  . BPH (benign prostatic hyperplasia) 03/03/2014  . Chest pain   . Palpitations   . Dyspnea on exertion   . ETOH abuse   . PTSD (post-traumatic stress disorder) 10/02/2013  . Abnormal stress test 08/29/2012  . GERD (gastroesophageal reflux disease)   . HTN (hypertension)   . Anxiety   . Depression   . Allergic rhinitis   . Diverticular disease   . Hemorrhoids   . Hiatal hernia   . Gout   . MACROCYTIC ANEMIA 08/09/2010  . NONSPEC ELEVATION OF LEVELS OF TRANSAMINASE/LDH 08/09/2010  . NONSPECIFIC ABNORMAL FINDING IN STOOL CONTENTS 08/09/2010    Standley Brooking, PTA 04/28/2020, 8:24 AM  [facility] [address] [place], [place], [address] Phone: [phone]   Fax:  [phone]  Name: Joseph Larsen MRN: [id number] Date of Birth: 11-Sep-1946

## 2020-04-29 ENCOUNTER — Encounter: Payer: Medicare Other | Admitting: Physical Therapy

## 2020-04-29 DIAGNOSIS — Z9889 Other specified postprocedural states: Secondary | ICD-10-CM | POA: Diagnosis not present

## 2020-04-29 DIAGNOSIS — M19011 Primary osteoarthritis, right shoulder: Secondary | ICD-10-CM | POA: Diagnosis not present

## 2020-05-01 ENCOUNTER — Other Ambulatory Visit: Payer: Self-pay

## 2020-05-01 ENCOUNTER — Ambulatory Visit: Payer: Medicare Other | Admitting: Physical Therapy

## 2020-05-01 ENCOUNTER — Encounter: Payer: Self-pay | Admitting: Physical Therapy

## 2020-05-01 DIAGNOSIS — R293 Abnormal posture: Secondary | ICD-10-CM | POA: Diagnosis not present

## 2020-05-01 DIAGNOSIS — M25511 Pain in right shoulder: Secondary | ICD-10-CM | POA: Diagnosis not present

## 2020-05-01 NOTE — Therapy (Signed)
[facility] [place], [place], [address] Phone: [phone]   Fax:  [phone]  Physical Therapy Treatment  Patient Details  Name: Joseph Larsen MRN: [id number] Date of Birth: 1946-08-11 Referring Provider (PT): Sydnee Cabal, MD   Encounter Date: 05/01/2020  PT End of Session - 05/01/20 0935    Visit Number  39    Number of Visits  40    Date for PT Re-Evaluation  05/08/20    Authorization Type  FOTO, Progress note every 10th visit, KX modifier at 15th visit /  20th visit 41%    PT Start Time  0900    PT Stop Time  0944    PT Time Calculation (min)  44 min    Activity Tolerance  Patient tolerated treatment well    Behavior During Therapy  [facility] for tasks assessed/performed       Past Medical History:  Diagnosis Date  . Abnormal ECG    a. 08/2012 Abnl ETT with ST changes in recovery;  b. 08/2012 Ex MV, ex time 10:00, EF 66%, no ischemia/infarct.  . Allergic rhinitis   . Anxiety   . Cataract   . Depression   . Diverticular disease   . Dyspnea on exertion   . ETOH abuse   . GERD (gastroesophageal reflux disease)   . Gout   . Hemorrhoids   . Hiatal hernia   . HTN (hypertension)   . Low testosterone   . MACROCYTIC ANEMIA   . NONSPEC ELEVATION OF LEVELS OF TRANSAMINASE/LDH   . Nonspecific abnormal finding in stool contents   . Palpitations   . PTSD (post-traumatic stress disorder)   . PTSD (post-traumatic stress disorder)    [name] - [employer]    Past Surgical History:  Procedure Laterality Date  . CHOLECYSTECTOMY  2004  . KNEE SURGERY Bilateral   . SHOULDER ARTHROSCOPY Right 01/14/2020    There were no vitals filed for this visit.  Subjective Assessment - 05/01/20 0905    Subjective  COVID 19 screening performed on patient upon arrival. Patient arrived with no complaints    Limitations  Lifting;House hold activities    Patient Stated Goals  use arm again for home activities and shop work.    Currently  in Pain?  No/denies         [facility] PT Assessment - 05/01/20 0001      AROM   AROM Assessment Site  Shoulder    Right/Left Shoulder  Right    Right Shoulder Flexion  140 Degrees    Right Shoulder External Rotation  61 Degrees                    [facility] Adult PT Treatment/Exercise - 05/01/20 0001      Shoulder Exercises: Supine   Protraction  Strengthening;Right;20 reps;10 reps;Weights    Protraction Weight (lbs)  4      Shoulder Exercises: Sidelying   External Rotation  Strengthening;Right;20 reps;10 reps;Weights    External Rotation Weight (lbs)  2      Shoulder Exercises: Standing   Flexion  Strengthening;Right;20 reps;10 reps;Weights    Shoulder Flexion Weight (lbs)  2    ABduction  Strengthening;Right;20 reps;10 reps;Weights    Shoulder ABduction Weight (lbs)  2    Extension  Strengthening;Right;20 reps;10 reps;Weights    Extension Weight (lbs)  4    Row  Strengthening;Right;20 reps;10 reps;Weights    Row Weight (lbs)  4    Diagonals  Strengthening;Right;15  reps;Theraband    Theraband Level (Shoulder Diagonals)  Level 2 (Red)    Other Standing Exercises  bicep curl 4# x 30      Shoulder Exercises: ROM/Strengthening   UBE (Upper Arm Bike)  90 RPM x8 min    Wall Pushups  20 reps    Other ROM/Strengthening Exercises  red band around bil UE abd/flex on wall x fatigue      Vasopneumatic   Number Minutes Vasopneumatic   10 minutes    Vasopnuematic Location   Shoulder    Vasopneumatic Pressure  Low    Vasopneumatic Temperature   34 for edema               PT Short Term Goals - 01/21/20 1332      PT SHORT TERM GOAL #1   Title  STG=LTG        PT Long Term Goals - 05/01/20 0906      PT LONG TERM GOAL #1   Title  Patient will be independent with HEP and its progression    Time  4    Period  Weeks    Status  On-going      PT LONG TERM GOAL #2   Title  Patient will demonstrate 140+ degrees of right shoulder flexion AROM to improve ability to  perform overhead tasks.    Baseline  Met 04/01/20    Time  4    Period  Weeks    Status  Achieved      PT LONG TERM GOAL #3   Title  Patient will demonstate 55+ degrees of right shoulder ER AROM to improve ability to don/doff apparel.    Baseline  Met 04/01/20    Time  4    Period  Weeks    Status  Achieved      PT LONG TERM GOAL #4   Title  Patient will report ability to perform ADLs and home activities with right shoulder pain less than or equal to 4/10.    Baseline  Met 05/01/20    Time  4    Period  Weeks    Status  Achieved      PT LONG TERM GOAL #5   Title  Patient will demonstrate 4/5 or greater right shoulder MMT in all planes to improve stability during functional tasks.    Time  4    Period  Weeks    Status  On-going            Plan - 05/01/20 0940    Clinical Impression Statement  Patient tolerated treatment well today. Patient able to perform all exercises with fatigue only. Patient AROM is WNL. Patient is able to perform ADL's with no pain, he uses caution with activity. Patient met LTG#4 today, close to meeting all other goals.    Personal Factors and Comorbidities  Comorbidity 2    Comorbidities  right shoulder scope 01/14/2020, HTN    Examination-Activity Limitations  Bathing;Locomotion Level;Hygiene/Grooming;Dressing    Examination-Participation Restrictions  Driving    Stability/Clinical Decision Making  Stable/Uncomplicated    Rehab Potential  Good    PT Duration  4 weeks    PT Treatment/Interventions  ADLs/Self Care Home Management;Cryotherapy;Electrical Stimulation;Iontophoresis 19m/ml Dexamethasone;Moist Heat;Ultrasound;Neuromuscular re-education;Therapeutic exercise;Therapeutic activities;Patient/family education;Functional mobility training;Manual techniques;Passive range of motion;Vasopneumatic Device;Taping    PT Next Visit Plan  cont with 1 visit, assess remaining goals/HEP/FOTO    Consulted and Agree with Plan of Care  Patient  Patient will  benefit from skilled therapeutic intervention in order to improve the following deficits and impairments:  Decreased activity tolerance, Decreased strength, Decreased range of motion, Pain, Postural dysfunction, Impaired UE functional use  Visit Diagnosis: Acute pain of right shoulder  Abnormal posture     Problem List Patient Active Problem List   Diagnosis Date Noted  . Aortic atherosclerosis ([place]) 03/01/2018  . Barrett's esophagus 01/03/2017  . Non-healing ulcer ([place]) 10/13/2015  . Neoplasm of uncertain behavior of skin 10/08/2015  . Skin lesion 09/07/2015  . Hypokalemia 03/03/2014  . BPH (benign prostatic hyperplasia) 03/03/2014  . Chest pain   . Palpitations   . Dyspnea on exertion   . ETOH abuse   . PTSD (post-traumatic stress disorder) 10/02/2013  . Abnormal stress test 08/29/2012  . GERD (gastroesophageal reflux disease)   . HTN (hypertension)   . Anxiety   . Depression   . Allergic rhinitis   . Diverticular disease   . Hemorrhoids   . Hiatal hernia   . Gout   . MACROCYTIC ANEMIA 08/09/2010  . NONSPEC ELEVATION OF LEVELS OF TRANSAMINASE/LDH 08/09/2010  . NONSPECIFIC ABNORMAL FINDING IN STOOL CONTENTS 08/09/2010    Phillips Climes, PTA 05/01/2020, 9:53 AM  [facility] [place], [place], [address] Phone: [phone]   Fax:  [phone]  Name: TANYA MARVIN MRN: [id number] Date of Birth: [birth year]

## 2020-05-04 DIAGNOSIS — I480 Paroxysmal atrial fibrillation: Secondary | ICD-10-CM | POA: Diagnosis not present

## 2020-05-04 DIAGNOSIS — R002 Palpitations: Secondary | ICD-10-CM | POA: Diagnosis not present

## 2020-05-06 ENCOUNTER — Encounter (HOSPITAL_COMMUNITY): Payer: Self-pay | Admitting: Nurse Practitioner

## 2020-05-06 ENCOUNTER — Ambulatory Visit (HOSPITAL_COMMUNITY)
Admission: RE | Admit: 2020-05-06 | Discharge: 2020-05-06 | Disposition: A | Discharge status: Home / Self Care | Payer: Medicare Other | Source: Ambulatory Visit | Attending: Nurse Practitioner | Admitting: Nurse Practitioner

## 2020-05-06 ENCOUNTER — Encounter: Payer: Self-pay | Admitting: Physical Therapy

## 2020-05-06 ENCOUNTER — Other Ambulatory Visit (HOSPITAL_COMMUNITY): Payer: Self-pay | Admitting: *Deleted

## 2020-05-06 ENCOUNTER — Other Ambulatory Visit: Payer: Self-pay

## 2020-05-06 ENCOUNTER — Ambulatory Visit: Payer: Medicare Other | Admitting: Physical Therapy

## 2020-05-06 VITALS — BP 166/100 | HR 63 | Ht 71.0 in | Wt 188.6 lb

## 2020-05-06 DIAGNOSIS — Z87891 Personal history of nicotine dependence: Secondary | ICD-10-CM | POA: Insufficient documentation

## 2020-05-06 DIAGNOSIS — F419 Anxiety disorder, unspecified: Secondary | ICD-10-CM | POA: Insufficient documentation

## 2020-05-06 DIAGNOSIS — I10 Essential (primary) hypertension: Secondary | ICD-10-CM | POA: Diagnosis not present

## 2020-05-06 DIAGNOSIS — R293 Abnormal posture: Secondary | ICD-10-CM | POA: Diagnosis not present

## 2020-05-06 DIAGNOSIS — M25511 Pain in right shoulder: Secondary | ICD-10-CM | POA: Diagnosis not present

## 2020-05-06 DIAGNOSIS — M109 Gout, unspecified: Secondary | ICD-10-CM | POA: Insufficient documentation

## 2020-05-06 DIAGNOSIS — D6869 Other thrombophilia: Secondary | ICD-10-CM

## 2020-05-06 DIAGNOSIS — I480 Paroxysmal atrial fibrillation: Secondary | ICD-10-CM

## 2020-05-06 DIAGNOSIS — D539 Nutritional anemia, unspecified: Secondary | ICD-10-CM | POA: Insufficient documentation

## 2020-05-06 DIAGNOSIS — Z7901 Long term (current) use of anticoagulants: Secondary | ICD-10-CM | POA: Diagnosis not present

## 2020-05-06 DIAGNOSIS — F329 Major depressive disorder, single episode, unspecified: Secondary | ICD-10-CM | POA: Insufficient documentation

## 2020-05-06 DIAGNOSIS — K219 Gastro-esophageal reflux disease without esophagitis: Secondary | ICD-10-CM | POA: Insufficient documentation

## 2020-05-06 DIAGNOSIS — F4310 Post-traumatic stress disorder, unspecified: Secondary | ICD-10-CM | POA: Diagnosis not present

## 2020-05-06 DIAGNOSIS — Z79899 Other long term (current) drug therapy: Secondary | ICD-10-CM | POA: Diagnosis not present

## 2020-05-06 DIAGNOSIS — I4819 Other persistent atrial fibrillation: Secondary | ICD-10-CM | POA: Insufficient documentation

## 2020-05-06 NOTE — Therapy (Signed)
[facility] [address] [place], [place], [address] Phone: [phone]   Fax:  [phone]  Physical Therapy Treatment  Patient Details  Name: Joseph Larsen MRN: [id number] Date of Birth: 1946-01-23 Referring Provider (PT): Eugenia Mcalpine, MD   Encounter Date: 05/06/2020  PT End of Session - 05/06/20 0855    Visit Number  40    Number of Visits  40    Date for PT Re-Evaluation  05/08/20    Authorization Type  FOTO, Progress note every 10th visit, KX modifier at 15th visit /  20th visit 41%    PT Start Time  0815    PT Stop Time  0902    PT Time Calculation (min)  47 min    Equipment Utilized During Treatment  Other (comment)    Activity Tolerance  Patient tolerated treatment well    Behavior During Therapy  [facility] for tasks assessed/performed       Past Medical History:  Diagnosis Date  . Abnormal ECG    a. 08/2012 Abnl ETT with ST changes in recovery;  b. 08/2012 Ex MV, ex time 10:00, EF 66%, no ischemia/infarct.  . Allergic rhinitis   . Anxiety   . Cataract   . Depression   . Diverticular disease   . Dyspnea on exertion   . ETOH abuse   . GERD (gastroesophageal reflux disease)   . Gout   . Hemorrhoids   . Hiatal hernia   . HTN (hypertension)   . Low testosterone   . MACROCYTIC ANEMIA   . NONSPEC ELEVATION OF LEVELS OF TRANSAMINASE/LDH   . Nonspecific abnormal finding in stool contents   . Palpitations   . PTSD (post-traumatic stress disorder)   . PTSD (post-traumatic stress disorder)    [place] V[employer] - [employer]    Past Surgical History:  Procedure Laterality Date  . CHOLECYSTECTOMY  2004  . KNEE SURGERY Bilateral   . SHOULDER ARTHROSCOPY Right 01/14/2020    There were no vitals filed for this visit.  Subjective Assessment - 05/06/20 0843    Subjective  COVID-19 screen performed prior to patient entering clinic.  Doing great.    Limitations  Lifting;House hold activities    Patient Stated Goals  use arm again for home  activities and shop work.    Currently in Pain?  Yes    Pain Score  2     Pain Onset  More than a month ago                        [facility] Adult PT Treatment/Exercise - 05/06/20 0001      Shoulder Exercises: Standing   Other Standing Exercises  2# right shoulder flexion, full can and abduction to fatigue x 2.    Other Standing Exercises  5# bicep curls to fatigue x 2.  2# bent rows and extension 2 sets to fatigue.      Shoulder Exercises: Pulleys   Flexion  --   7 minutes.     Shoulder Exercises: ROM/Strengthening   UBE (Upper Arm Bike)  10 minutes.               PT Short Term Goals - 01/21/20 1332      PT SHORT TERM GOAL #1   Title  STG=LTG        PT Long Term Goals - 05/06/20 0851      PT LONG TERM GOAL #1   Title  Patient will  be independent with HEP and its progression    Time  4    Period  Weeks    Status  Achieved      PT LONG TERM GOAL #2   Title  Patient will demonstrate 140+ degrees of right shoulder flexion AROM to improve ability to perform overhead tasks.    Baseline  Met 04/01/20    Time  4    Period  Weeks    Status  Achieved      PT LONG TERM GOAL #3   Title  Patient will demonstate 55+ degrees of right shoulder ER AROM to improve ability to don/doff apparel.    Baseline  Met 04/01/20    Time  4    Period  Weeks      PT LONG TERM GOAL #4   Title  Patient will report ability to perform ADLs and home activities with right shoulder pain less than or equal to 4/10.    Baseline  Met 05/01/20    Time  4    Period  Weeks    Status  Achieved      PT LONG TERM GOAL #5   Title  Patient will demonstrate 4/5 or greater right shoulder MMT in all planes to improve stability during functional tasks.    Time  4    Period  Weeks    Status  Achieved            Plan - 05/06/20 4034    Clinical Impression Statement  Excellent progress.  See discharge summary.    Personal Factors and Comorbidities  Comorbidity 2    Comorbidities   right shoulder scope 01/14/2020, HTN    Examination-Activity Limitations  Bathing;Locomotion Level;Hygiene/Grooming;Dressing    Examination-Participation Restrictions  Driving    Stability/Clinical Decision Making  Stable/Uncomplicated    Rehab Potential  Good    PT Frequency  3x / week    PT Duration  4 weeks    PT Treatment/Interventions  ADLs/Self Care Home Management;Cryotherapy;Electrical Stimulation;Iontophoresis 4mg /ml Dexamethasone;Moist Heat;Ultrasound;Neuromuscular re-education;Therapeutic exercise;Therapeutic activities;Patient/family education;Functional mobility training;Manual techniques;Passive range of motion;Vasopneumatic Device;Taping    PT Next Visit Plan  cont with 1 visit, assess remaining goals/HEP/FOTO    PT Home Exercise Plan  see patient education section       Patient will benefit from skilled therapeutic intervention in order to improve the following deficits and impairments:  Decreased activity tolerance, Decreased strength, Decreased range of motion, Pain, Postural dysfunction, Impaired UE functional use  Visit Diagnosis: Acute pain of right shoulder  Abnormal posture     Problem List Patient Active Problem List   Diagnosis Date Noted  . Aortic atherosclerosis ([facility]) 03/01/2018  . Barrett's esophagus 01/03/2017  . Non-healing ulcer ([facility]) 10/13/2015  . Neoplasm of uncertain behavior of skin 10/08/2015  . Skin lesion 09/07/2015  . Hypokalemia 03/03/2014  . BPH (benign prostatic hyperplasia) 03/03/2014  . Chest pain   . Palpitations   . Dyspnea on exertion   . ETOH abuse   . PTSD (post-traumatic stress disorder) 10/02/2013  . Abnormal stress test 08/29/2012  . GERD (gastroesophageal reflux disease)   . HTN (hypertension)   . Anxiety   . Depression   . Allergic rhinitis   . Diverticular disease   . Hemorrhoids   . Hiatal hernia   . Gout   . MACROCYTIC ANEMIA 08/09/2010  . NONSPEC ELEVATION OF LEVELS OF TRANSAMINASE/LDH 08/09/2010  . NONSPECIFIC  ABNORMAL FINDING IN STOOL CONTENTS 08/09/2010    PHYSICAL THERAPY DISCHARGE  SUMMARY  Visits from Start of Care: 40  Current functional level related to goals / functional outcomes: See above.   Remaining deficits: All goals met.   Education / Equipment: HEP. Plan: Patient agrees to discharge.  Patient goals were met. Patient is being discharged due to meeting the stated rehab goals.  ?????      Joyleen Haselton, [place] MPT 05/06/2020, 9:03 AM  [facility] [address] [place], [place], [address] Phone: [phone]   Fax:  [phone]  Name: ZYKEL OZMENT MRN: [id number] Date of Birth: [birth year]

## 2020-05-06 NOTE — Patient Instructions (Signed)
Cardioversion scheduled for  - Arrive at the [facility] and go to admitting at  - Do not eat or drink anything after midnight the night prior to your procedure.  - Take all your morning medication (except diabetic medications) with a sip of water prior to arrival.  - You will not be able to drive home after your procedure.  - Do NOT miss any doses of your blood thinner - if you should miss a dose please notify our office immediately.

## 2020-05-06 NOTE — H&P (View-Only) (Signed)
Primary Care Physician: Joseph Norlander, DO Referring Physician: Dr. Vella Larsen is a 74 y.o. male with a h/o paroxysmal afib on eliquis 5 mg bid for a CHA2DS2VASc score of 2. I saw pt last in 2019 when his atenolol was adjusted for afib and he has been in rhythm until recently.  He was seen by Dr. Johnsie Larsen for a check-up recently and mentioned that he did feel as well for a couple of months. A monitor was placed and now he is in the afib clinic to be set up for cardioversion. No missed eliquis for at least 3 weeks. No  known trigger. He has had both covid shots.  Today, he denies symptoms of palpitations, chest pain, shortness of breath, orthopnea, PND, lower extremity edema, dizziness, presyncope, syncope, or neurologic sequela. The patient is tolerating medications without difficulties and is otherwise without complaint today.   Past Medical History:  Diagnosis Date  . Abnormal ECG    a. 08/2012 Abnl ETT with ST changes in recovery;  b. 08/2012 Ex MV, ex time 10:00, EF 66%, no ischemia/infarct.  . Allergic rhinitis   . Anxiety   . Cataract   . Depression   . Diverticular disease   . Dyspnea on exertion   . ETOH abuse   . GERD (gastroesophageal reflux disease)   . Gout   . Hemorrhoids   . Hiatal hernia   . HTN (hypertension)   . Low testosterone   . MACROCYTIC ANEMIA   . NONSPEC ELEVATION OF LEVELS OF TRANSAMINASE/LDH   . Nonspecific abnormal finding in stool contents   . Palpitations   . PTSD (post-traumatic stress disorder)   . PTSD (post-traumatic stress disorder)    [name] - [employer]   Past Surgical History:  Procedure Laterality Date  . CHOLECYSTECTOMY  2004  . KNEE SURGERY Bilateral   . SHOULDER ARTHROSCOPY Right 01/14/2020    Current Outpatient Medications  Medication Sig Dispense Refill  . ALPRAZolam (XANAX) 0.5 MG tablet Take 0.5 mg by mouth at bedtime. Outside provider    . apixaban (ELIQUIS) 5 MG TABS tablet Take 1 tablet (5 mg total) by mouth 2  (two) times daily. 60 tablet 0  . atenolol (TENORMIN) 25 MG tablet Take 1 tablet (25 mg total) by mouth 2 (two) times daily. 60 tablet 3  . cholecalciferol (VITAMIN D) 1000 UNITS tablet Take 1,000 Units by mouth daily.    . hydrochlorothiazide (HYDRODIURIL) 25 MG tablet Take 25 mg by mouth daily.     Marland Kitchen losartan (COZAAR) 100 MG tablet Take 50 mg by mouth daily.    . Misc Natural Products (OSTEO BI-FLEX/5-LOXIN ADVANCED PO) Take 1 tablet by mouth daily.     . Omega-3 Fatty Acids (FISH OIL) 1000 MG CAPS Take 4 capsules by mouth daily.     . QUEtiapine (SEROQUEL) 100 MG tablet Take 100 mg by mouth at bedtime. Pt only takes 1/4 tab    . omeprazole (PRILOSEC) 40 MG capsule Take 40 mg by mouth as needed.      Current Facility-Administered Medications  Medication Dose Route Frequency Provider Last Rate Last Admin  . cyanocobalamin ((VITAMIN B-12)) injection 1,000 mcg  1,000 mcg Intramuscular Q30 days Joseph Euler, MD   1,000 mcg at 02/13/18 0932    Allergies  Allergen Reactions  . Terazosin Rash    Rash when in sun  . Enalapril Maleate     REACTION: rash  . Enalapril Maleate Rash  REACTION: rash  . Lisinopril Rash and Itching  . Penicillins Rash and Itching    REACTION: rash, itching REACTION: rash, itching    Social History   Socioeconomic History  . Marital status: Married    Spouse name: Joseph Larsen  . Number of children: 1  . Years of education: Not on file  . Highest education level: Not on file  Occupational History  . Occupation: disabled    Employer: RETIRED    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  Tobacco Use  . Smoking status: Former Smoker    Packs/day: 3.00    Types: Cigarettes    Start date: 02/09/1965    Quit date: 05/12/1982    Years since quitting: 38.0  . Smokeless tobacco: Never Used  Substance and Sexual Activity  . Alcohol use: Yes    Alcohol/week: 14.0 standard drinks    Types: 14 Cans of beer per week    Comment: drinks 2-3 cans of beer/day     . Drug use: No    Comment: 1 cup of decaf coffee/day.  Marland Kitchen Sexual activity: Yes  Other Topics Concern  . Not on file  Social History Narrative   Lives with wife in [place] in a one story home.  Exercises @ gym 3x/wk - stationary bike and light weight training.   Social Determinants of Health   Financial Resource Strain:   . Difficulty of Paying Living Expenses:   Food Insecurity:   . Worried About Charity fundraiser in the Last Year:   . Arboriculturist in the Last Year:   Transportation Needs:   . Film/video editor (Medical):   Marland Kitchen Lack of Transportation (Non-Medical):   Physical Activity:   . Days of Exercise per Week:   . Minutes of Exercise per Session:   Stress:   . Feeling of Stress :   Social Connections:   . Frequency of Communication with Friends and Family:   . Frequency of Social Gatherings with Friends and Family:   . Attends Religious Services:   . Active Member of Clubs or Organizations:   . Attends Archivist Meetings:   Marland Kitchen Marital Status:   Intimate Partner Violence:   . Fear of Current or Ex-Partner:   . Emotionally Abused:   Marland Kitchen Physically Abused:   . Sexually Abused:     Family History  Problem Relation Age of Onset  . Heart disease Mother        "died of chf"  . Heart disease Father        "died of old age"  . Hypertension Sister   . Liver disease Brother   . Hypertension Brother   . Hypertension Sister   . Hypertension Sister   . Hypertension Sister   . Heart murmur Sister   . Colon cancer Neg Hx     ROS- All systems are reviewed and negative except as per the HPI above  Physical Exam: Vitals:   05/06/20 1137  BP: (!) 166/100  Pulse: 63  Weight: 85.5 kg  Height: 5\' 11"  (1.803 m)   Wt Readings from Last 3 Encounters:  05/06/20 85.5 kg  04/13/20 85.7 kg  04/06/20 85.7 kg    Labs: Lab Results  Component Value Date   NA 143 04/21/2020   K 3.6 04/21/2020   CL 100 04/21/2020   CO2 26 04/21/2020   GLUCOSE 81  04/21/2020   BUN 13 04/21/2020   CREATININE 0.90 04/21/2020   CALCIUM 9.4 04/21/2020  MG 1.6 04/21/2020   Lab Results  Component Value Date   INR 1.1 ratio (H) 08/09/2010   Lab Results  Component Value Date   CHOL 98 (L) 04/13/2020   HDL 28 (L) 04/13/2020   LDLCALC 55 04/13/2020   TRIG 68 04/13/2020     GEN- The patient is well appearing, alert and oriented x 3 today.   Head- normocephalic, atraumatic Eyes-  Sclera clear, conjunctiva pink Ears- hearing intact Oropharynx- clear Neck- supple, no JVP Lymph- no cervical lymphadenopathy Lungs- Clear to ausculation bilaterally, normal work of breathing Heart- irregular rate and rhythm, no murmurs, rubs or gallops, PMI not laterally displaced GI- soft, NT, ND, + BS Extremities- no clubbing, cyanosis, or edema MS- no significant deformity or atrophy Skin- no rash or lesion Psych- euthymic mood, full affect Neuro- strength and sensation are intact  EKG-afib at 63 bpm, qrs int 99 ms, qtc at 429 ms Zio patch reviewed which showed afib 100% of the time worn.     Assessment and Plan: 1. Persistent  afib  Has been in afib possibly for the last 2-3 months Will schedule for cardioversion, will try to arrange with Dr. Kyla Balzarine schedule  He is rate controlled Continue  usual dose of atenolol  2. CHA2DS2VASc score of 2 States no missed doses of eliquis 5 mg bid for at least 3 weeks, reminded not to miss any doses  F/u in [place] 6/7 as scheduled   Geroge Baseman. Latanya Hemmer, [facility] [address] [place], [place] [address] [phone]

## 2020-05-06 NOTE — Progress Notes (Signed)
Primary Care Physician: [name], DO Referring Physician: Dr. Vella Kohler is a 74 y.o. male with a h/o paroxysmal afib on eliquis 5 mg bid for a CHA2DS2VASc score of 2. I saw pt last in 2019 when his atenolol was adjusted for afib and he has been in rhythm until recently.  He was seen by Dr. Johnsie Cancel for a check-up recently and mentioned that he did feel as well for a couple of months. A monitor was placed and now he is in the afib clinic to be set up for cardioversion. No missed eliquis for at least 3 weeks. No  known trigger. He has had both covid shots.  Today, he denies symptoms of palpitations, chest pain, shortness of breath, orthopnea, PND, lower extremity edema, dizziness, presyncope, syncope, or neurologic sequela. The patient is tolerating medications without difficulties and is otherwise without complaint today.   Past Medical History:  Diagnosis Date  . Abnormal ECG    a. 08/2012 Abnl ETT with ST changes in recovery;  b. 08/2012 Ex MV, ex time 10:00, EF 66%, no ischemia/infarct.  . Allergic rhinitis   . Anxiety   . Cataract   . Depression   . Diverticular disease   . Dyspnea on exertion   . ETOH abuse   . GERD (gastroesophageal reflux disease)   . Gout   . Hemorrhoids   . Hiatal hernia   . HTN (hypertension)   . Low testosterone   . MACROCYTIC ANEMIA   . NONSPEC ELEVATION OF LEVELS OF TRANSAMINASE/LDH   . Nonspecific abnormal finding in stool contents   . Palpitations   . PTSD (post-traumatic stress disorder)   . PTSD (post-traumatic stress disorder)    [name]   Past Surgical History:  Procedure Laterality Date  . CHOLECYSTECTOMY  2004  . KNEE SURGERY Bilateral   . SHOULDER ARTHROSCOPY Right 01/14/2020    Current Outpatient Medications  Medication Sig Dispense Refill  . ALPRAZolam (XANAX) 0.5 MG tablet Take 0.5 mg by mouth at bedtime. Outside provider    . apixaban (ELIQUIS) 5 MG TABS tablet Take 1 tablet (5 mg total) by mouth 2  (two) times daily. 60 tablet 0  . atenolol (TENORMIN) 25 MG tablet Take 1 tablet (25 mg total) by mouth 2 (two) times daily. 60 tablet 3  . cholecalciferol (VITAMIN D) 1000 UNITS tablet Take 1,000 Units by mouth daily.    . hydrochlorothiazide (HYDRODIURIL) 25 MG tablet Take 25 mg by mouth daily.     Marland Kitchen losartan (COZAAR) 100 MG tablet Take 50 mg by mouth daily.    . Misc Natural Products (OSTEO BI-FLEX/5-LOXIN ADVANCED PO) Take 1 tablet by mouth daily.     . Omega-3 Fatty Acids (FISH OIL) 1000 MG CAPS Take 4 capsules by mouth daily.     . QUEtiapine (SEROQUEL) 100 MG tablet Take 100 mg by mouth at bedtime. Pt only takes 1/4 tab    . omeprazole (PRILOSEC) 40 MG capsule Take 40 mg by mouth as needed.      Current Facility-Administered Medications  Medication Dose Route Frequency Provider Last Rate Last Admin  . cyanocobalamin ((VITAMIN B-12)) injection 1,000 mcg  1,000 mcg Intramuscular Q30 days Timmothy Euler, MD   1,000 mcg at 02/13/18 0932    Allergies  Allergen Reactions  . Terazosin Rash    Rash when in sun  . Enalapril Maleate     REACTION: rash  . Enalapril Maleate Rash  REACTION: rash  . Lisinopril Rash and Itching  . Penicillins Rash and Itching    REACTION: rash, itching REACTION: rash, itching    Social History   Socioeconomic History  . Marital status: Married    Spouse name: Suanne Marker  . Number of children: 1  . Years of education: Not on file  . Highest education level: Not on file  Occupational History  . Occupation: disabled    Employer: RETIRED    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  Tobacco Use  . Smoking status: Former Smoker    Packs/day: 3.00    Types: Cigarettes    Start date: 02/09/1965    Quit date: 05/12/1982    Years since quitting: 38.0  . Smokeless tobacco: Never Used  Substance and Sexual Activity  . Alcohol use: Yes    Alcohol/week: 14.0 standard drinks    Types: 14 Cans of beer per week    Comment: drinks 2-3 cans of beer/day     . Drug use: No    Comment: 1 cup of decaf coffee/day.  Marland Kitchen Sexual activity: Yes  Other Topics Concern  . Not on file  Social History Narrative   Lives with wife in [place] in a one story home.  Exercises @ gym 3x/wk - stationary bike and light weight training.   Social Determinants of Health   Financial Resource Strain:   . Difficulty of Paying Living Expenses:   Food Insecurity:   . Worried About Charity fundraiser in the Last Year:   . Arboriculturist in the Last Year:   Transportation Needs:   . Film/video editor (Medical):   Marland Kitchen Lack of Transportation (Non-Medical):   Physical Activity:   . Days of Exercise per Week:   . Minutes of Exercise per Session:   Stress:   . Feeling of Stress :   Social Connections:   . Frequency of Communication with Friends and Family:   . Frequency of Social Gatherings with Friends and Family:   . Attends Religious Services:   . Active Member of Clubs or Organizations:   . Attends Archivist Meetings:   Marland Kitchen Marital Status:   Intimate Partner Violence:   . Fear of Current or Ex-Partner:   . Emotionally Abused:   Marland Kitchen Physically Abused:   . Sexually Abused:     Family History  Problem Relation Age of Onset  . Heart disease Mother        "died of chf"  . Heart disease Father        "died of old age"  . Hypertension Sister   . Liver disease Brother   . Hypertension Brother   . Hypertension Sister   . Hypertension Sister   . Hypertension Sister   . Heart murmur Sister   . Colon cancer Neg Hx     ROS- All systems are reviewed and negative except as per the HPI above  Physical Exam: Vitals:   05/06/20 1137  BP: (!) 166/100  Pulse: 63  Weight: 85.5 kg  Height: 5\' 11"  (1.803 m)   Wt Readings from Last 3 Encounters:  05/06/20 85.5 kg  04/13/20 85.7 kg  04/06/20 85.7 kg    Labs: Lab Results  Component Value Date   NA 143 04/21/2020   K 3.6 04/21/2020   CL 100 04/21/2020   CO2 26 04/21/2020   GLUCOSE 81  04/21/2020   BUN 13 04/21/2020   CREATININE 0.90 04/21/2020   CALCIUM 9.4 04/21/2020  MG 1.6 04/21/2020   Lab Results  Component Value Date   INR 1.1 ratio (H) 08/09/2010   Lab Results  Component Value Date   CHOL 98 (L) 04/13/2020   HDL 28 (L) 04/13/2020   LDLCALC 55 04/13/2020   TRIG 68 04/13/2020     GEN- The patient is well appearing, alert and oriented x 3 today.   Head- normocephalic, atraumatic Eyes-  Sclera clear, conjunctiva pink Ears- hearing intact Oropharynx- clear Neck- supple, no JVP Lymph- no cervical lymphadenopathy Lungs- Clear to ausculation bilaterally, normal work of breathing Heart- irregular rate and rhythm, no murmurs, rubs or gallops, PMI not laterally displaced GI- soft, NT, ND, + BS Extremities- no clubbing, cyanosis, or edema MS- no significant deformity or atrophy Skin- no rash or lesion Psych- euthymic mood, full affect Neuro- strength and sensation are intact  EKG-afib at 63 bpm, qrs int 99 ms, qtc at 429 ms Zio patch reviewed which showed afib 100% of the time worn.     Assessment and Plan: 1. Persistent  afib  Has been in afib possibly for the last 2-3 months Will schedule for cardioversion, will try to arrange with Dr. Kyla Balzarine schedule  He is rate controlled Continue  usual dose of atenolol  2. CHA2DS2VASc score of 2 States no missed doses of eliquis 5 mg bid for at least 3 weeks, reminded not to miss any doses  F/u in [place] 6/7 as scheduled   Geroge Baseman. Bernestine Holsapple, [facility] [address] [place], [place] [address] [phone]

## 2020-05-11 NOTE — Progress Notes (Signed)
Cardiology Office Note   Date:  05/18/2020   ID:  Joseph Larsen, DOB 1946-08-02, MRN [id number]  PCP:  Joseph Norlander, DO  Cardiologist:  Dr. Johnsie Cancel, MD   Chief Complaint  Patient presents with  . Atrial Fibrillation    History of Present Illness: Joseph Larsen is a 74 y.o. male who presents for 6-week follow-up, seen for Joseph Larsen.   [name] has a history of former tobacco use, HTN, history of EtOH and PAF.  He had a Myoview stress test in 2013 which was normal.  Has a history of previous palpitations at which time he wore a Holter monitor in 2015 which showed PACs only.  Echocardiogram at that time showed mild LVH, EF at 55 to 60% with mild MR and an aortic root measuring 3.9 cm.  Repeat monitor 04/28/2018 with PAF therefore he was started on atenolol and Eliquis for anticoagulation and a CHA2DS2VASc of 2.  Repeat Myoview 05/15/2018 with no ischemia, normal.  Repeat echocardiogram 05/15/2018 with normal EF, no significant valve disease.    At last office visit with Joseph Larsen 03/2020 he had complaints of weakness and dyspnea with some palpitations. 14-day monitor showed atrial fibrillation.  He was then referred to the atrial fibrillation for DCCV workup. Plan was for consistent eliquis dosing for at least 3 weeks>>then set up for f/u EKG/assessment with plans for OP DCCV which was performed 05/15/2020  Today he is here for follow up. EKG shows rate controlled AF.  Discussed case with Joseph Larsen, [place], who suggests initiation of flecainide 50 mg twice daily with follow-up exercise stress test 2 weeks after initiation.  Patient has no history of CAD.  Underwent Myoview stress testing in 2015 and again in 2019 with no evidence of ischemia.  He denies chest pain, palpitations, shortness of breath, LE edema, PND, dizziness or syncope.  He is asymptomatic with AF.  Is been compliant with Eliquis with no signs or symptoms of acute bleeding in stool or urine.  Discussed flecainide medication in  depth with all questions answered.  Patient agrees and understands.   Past Medical History:  Diagnosis Date  . Abnormal ECG    a. 08/2012 Abnl ETT with ST changes in recovery;  b. 08/2012 Ex MV, ex time 10:00, EF 66%, no ischemia/infarct.  . Allergic rhinitis   . Anxiety   . Cataract   . Depression   . Diverticular disease   . Dyspnea on exertion   . ETOH abuse   . GERD (gastroesophageal reflux disease)   . Gout   . Hemorrhoids   . Hiatal hernia   . HTN (hypertension)   . Low testosterone   . MACROCYTIC ANEMIA   . NONSPEC ELEVATION OF LEVELS OF TRANSAMINASE/LDH   . Nonspecific abnormal finding in stool contents   . Palpitations   . PTSD (post-traumatic stress disorder)   . PTSD (post-traumatic stress disorder)    [name]    Past Surgical History:  Procedure Laterality Date  . CARDIOVERSION N/A 05/15/2020   Procedure: CARDIOVERSION;  Surgeon: Joseph Margarita, MD;  Location: [facility] ENDOSCOPY;  Service: Cardiovascular;  Laterality: N/A;  . CHOLECYSTECTOMY  2004  . KNEE SURGERY Bilateral   . SHOULDER ARTHROSCOPY Right 01/14/2020     Current Outpatient Medications  Medication Sig Dispense Refill  . ALPRAZolam (XANAX) 0.5 MG tablet Take 0.25 mg by mouth at bedtime. Outside provider    . amLODipine (NORVASC) 10 MG tablet Take  10 mg by mouth daily. Per patient taking 1/2 tablet of 10 mg    . apixaban (ELIQUIS) 5 MG TABS tablet Take 1 tablet (5 mg total) by mouth 2 (two) times daily. 60 tablet 0  . atenolol (TENORMIN) 25 MG tablet Take 1 tablet (25 mg total) by mouth 2 (two) times daily. 60 tablet 3  . cholecalciferol (VITAMIN D) 1000 UNITS tablet Take 1,000 Units by mouth daily.    . hydrochlorothiazide (HYDRODIURIL) 25 MG tablet Take 25 mg by mouth daily.     Marland Kitchen losartan (COZAAR) 100 MG tablet Take 50 mg by mouth daily.    . Misc Natural Products (OSTEO BI-FLEX/5-LOXIN ADVANCED PO) Take 1 tablet by mouth daily.     . Omega-3 Fatty Acids (FISH OIL) 1000 MG CAPS Take 2  capsules by mouth in the morning and at bedtime.     Marland Kitchen omeprazole (PRILOSEC) 20 MG capsule Take 40 mg by mouth in the morning and at bedtime.     . potassium chloride SA (KLOR-CON) 20 MEQ tablet Take 1 tablet (20 mEq total) by mouth daily. 30 tablet 1  . QUEtiapine (SEROQUEL) 100 MG tablet Take 25 mg by mouth at bedtime. Pt only takes 1/4 tab     . flecainide (TAMBOCOR) 50 MG tablet Take 1 tablet (50 mg total) by mouth 2 (two) times daily. 60 tablet 6   Current Facility-Administered Medications  Medication Dose Route Frequency Provider Last Rate Last Admin  . cyanocobalamin ((VITAMIN B-12)) injection 1,000 mcg  1,000 mcg Intramuscular Q30 days Joseph Euler, MD   1,000 mcg at 02/13/18 [id number]    Allergies:   Terazosin, Enalapril maleate, Enalapril maleate, Lisinopril, and Penicillins    Social History:  The patient  reports that he quit smoking about 38 years ago. His smoking use included cigarettes. He started smoking about 55 years ago. He smoked 3.00 packs per day. He has never used smokeless tobacco. He reports current alcohol use of about 14.0 standard drinks of alcohol per week. He reports that he does not use drugs.   Family History:  The patient's family history includes Heart disease in his father and mother; Heart murmur in his sister; Hypertension in his brother, sister, sister, sister, and sister; Liver disease in his brother.    ROS:  Please see the history of present illness.   Otherwise, review of systems are positive for none.  All other systems are reviewed and negative.    PHYSICAL EXAM: VS:  BP (!) 114/92   Pulse 60   Ht 5\' 11"  (1.803 m)   Wt 182 lb 12.8 oz (82.9 kg)   SpO2 98%   BMI 25.50 kg/m  , BMI Body mass index is 25.5 kg/m.   General: Well developed, well nourished, NAD Skin: Warm, dry, intact  Lungs:Clear to ausculation bilaterally. No wheezes, rales, or rhonchi. Breathing is unlabored. Cardiovascular: Irregularly irregular with S1 S2. Extremities: No  edema. Radial pulses 2+ bilaterally Neuro: Alert and oriented. No focal deficits. No facial asymmetry. MAE spontaneously. Psych: Responds to questions appropriately with normal affect.    EKG:  EKG is ordered today. The ekg ordered today demonstrates atrial fibrillation with HR 72 bpm   Recent Labs: 04/13/2020: ALT 31; Platelets 159; TSH 2.560 04/21/2020: Magnesium 1.6 05/15/2020: BUN 13; Creatinine, Ser 0.70; Hemoglobin 18.4; Potassium 3.0; Sodium 143    Lipid Panel    Component Value Date/Time   CHOL 98 (L) 04/13/2020 0839   TRIG 68 04/13/2020 0839   TRIG  82 03/25/2015 0950   HDL 28 (L) 04/13/2020 0839   HDL 38 (L) 03/25/2015 0950   CHOLHDL 3.5 04/13/2020 0839   LDLCALC 55 04/13/2020 0839   LDLCALC 54 07/17/2014 0828   LDLDIRECT 74 01/03/2017 1100     Wt Readings from Last 3 Encounters:  05/18/20 182 lb 12.8 oz (82.9 kg)  05/15/20 188 lb 7.9 oz (85.5 kg)  05/06/20 188 lb 9.6 oz (85.5 kg)    Other studies Reviewed: Additional studies/ records that were reviewed today include:  Review of the above records demonstrates:  Echocardiogram 04/2020:  1. Left ventricular ejection fraction, by estimation, is 55 to 60%. The  left ventricle has normal function. The left ventricle has no regional  wall motion abnormalities. There is mild concentric left ventricular  hypertrophy. Left ventricular diastolic  parameters are indeterminate.  2. Right ventricular systolic function is normal. The right ventricular  size is normal. There is normal pulmonary artery systolic pressure.  3. Left atrial size was severely dilated.  4. The mitral valve is normal in structure. No evidence of mitral valve  regurgitation. No evidence of mitral stenosis.  5. The aortic valve is normal in structure. Aortic valve regurgitation is  not visualized. No aortic stenosis is present.  6. Aortic dilatation noted. Aneurysm of the ascending aorta, measuring 40  mm. There is mild dilatation of the aortic  root measuring 39 mm.  7. The inferior vena cava is dilated in size with <50% respiratory  variability, suggesting right atrial pressure of 15 mmHg.   Monitor for 2021:  Rhythm Afib Rates 41-165 average 81 bpm Longes R-R 3.1 second pause PVCls rare <1% total beats    ASSESSMENT AND PLAN:  1.  Persistent atrial fibrillation: -Recently seen by Joseph Larsen and in the AF clinic>> felt to possibly be in A. fib for the last 2 to 3 months per monitor  -OP DCCV on 05/15/2020>>EKG today with rate controlled atrial fibrillation -Continue Eliquis for [facility] -Atenolol for rate control -Initiate flecainide 50 mg p.o. twice daily and follow with exercise stress test 2 weeks after initiation -Reports no missed Eliquis doses  -Echocardiogram with normal EF>>RA is normal in size.  -CHA2DS2VASc 2  2. Hx chest pain: -Normal Myoview stress test in 2015 and 2019 -No recurrence>>denies today   3.  HTN: -Stable, 114/92 -Continue current regimen   4.  HLD: -Last LDL, 48 on 08/31/2018 -Lab work per [employer] -Myalgias with statin therapy therefore this was held for 6 weeks with OTC Co Q 10   Current medicines are reviewed at length with the patient today.  The patient does not have concerns regarding medicines.  The following changes have been made:  Add Flecainide 50mg  PO BID   Labs/ tests ordered today include: BMET, Mg  Orders Placed This Encounter  Procedures  . EKG 12-Lead    Disposition:   FU with atrial fibrillation clinic in 3 weeks  Signed, Kathyrn Drown, NP  05/18/2020 10:25 AM    [facility] [place], [place], [place]  [address] Phone: [phone]; Fax: [phone]

## 2020-05-12 ENCOUNTER — Other Ambulatory Visit (HOSPITAL_COMMUNITY)
Admission: RE | Admit: 2020-05-12 | Discharge: 2020-05-12 | Disposition: A | Discharge status: Home / Self Care | Payer: Medicare Other | Source: Ambulatory Visit | Attending: Cardiology | Admitting: Cardiology

## 2020-05-12 ENCOUNTER — Other Ambulatory Visit: Payer: Self-pay

## 2020-05-12 DIAGNOSIS — Z01812 Encounter for preprocedural laboratory examination: Secondary | ICD-10-CM | POA: Diagnosis not present

## 2020-05-12 DIAGNOSIS — Z20822 Contact with and (suspected) exposure to covid-19: Secondary | ICD-10-CM | POA: Diagnosis not present

## 2020-05-12 LAB — SARS CORONAVIRUS 2 (TAT 6-24 HRS): SARS Coronavirus 2: NEGATIVE

## 2020-05-15 ENCOUNTER — Other Ambulatory Visit (HOSPITAL_COMMUNITY): Payer: Self-pay | Admitting: *Deleted

## 2020-05-15 ENCOUNTER — Encounter (HOSPITAL_COMMUNITY)
Admission: RE | Disposition: A | Discharge status: Home / Self Care | Payer: Self-pay | Source: Home / Self Care | Attending: Cardiology

## 2020-05-15 ENCOUNTER — Other Ambulatory Visit (HOSPITAL_COMMUNITY): Payer: Medicare Other | Admitting: Nurse Practitioner

## 2020-05-15 ENCOUNTER — Ambulatory Visit (HOSPITAL_COMMUNITY)
Admission: RE | Admit: 2020-05-15 | Discharge: 2020-05-15 | Disposition: A | Discharge status: Home / Self Care | Payer: Medicare Other | Attending: Cardiology | Admitting: Cardiology

## 2020-05-15 ENCOUNTER — Ambulatory Visit (HOSPITAL_COMMUNITY): Payer: Medicare Other | Admitting: Certified Registered Nurse Anesthetist

## 2020-05-15 ENCOUNTER — Other Ambulatory Visit: Payer: Self-pay

## 2020-05-15 ENCOUNTER — Encounter (HOSPITAL_COMMUNITY): Payer: Self-pay | Admitting: Cardiology

## 2020-05-15 DIAGNOSIS — Z79899 Other long term (current) drug therapy: Secondary | ICD-10-CM | POA: Diagnosis not present

## 2020-05-15 DIAGNOSIS — F4310 Post-traumatic stress disorder, unspecified: Secondary | ICD-10-CM | POA: Diagnosis not present

## 2020-05-15 DIAGNOSIS — K219 Gastro-esophageal reflux disease without esophagitis: Secondary | ICD-10-CM | POA: Diagnosis not present

## 2020-05-15 DIAGNOSIS — I4891 Unspecified atrial fibrillation: Secondary | ICD-10-CM | POA: Diagnosis not present

## 2020-05-15 DIAGNOSIS — I4819 Other persistent atrial fibrillation: Secondary | ICD-10-CM | POA: Diagnosis not present

## 2020-05-15 DIAGNOSIS — Z7901 Long term (current) use of anticoagulants: Secondary | ICD-10-CM | POA: Diagnosis not present

## 2020-05-15 DIAGNOSIS — Z87891 Personal history of nicotine dependence: Secondary | ICD-10-CM | POA: Diagnosis not present

## 2020-05-15 DIAGNOSIS — M109 Gout, unspecified: Secondary | ICD-10-CM | POA: Insufficient documentation

## 2020-05-15 DIAGNOSIS — F419 Anxiety disorder, unspecified: Secondary | ICD-10-CM | POA: Insufficient documentation

## 2020-05-15 DIAGNOSIS — F329 Major depressive disorder, single episode, unspecified: Secondary | ICD-10-CM | POA: Insufficient documentation

## 2020-05-15 DIAGNOSIS — Z888 Allergy status to other drugs, medicaments and biological substances status: Secondary | ICD-10-CM | POA: Insufficient documentation

## 2020-05-15 DIAGNOSIS — Z880 Allergy status to penicillin: Secondary | ICD-10-CM | POA: Insufficient documentation

## 2020-05-15 DIAGNOSIS — J309 Allergic rhinitis, unspecified: Secondary | ICD-10-CM | POA: Diagnosis not present

## 2020-05-15 DIAGNOSIS — I10 Essential (primary) hypertension: Secondary | ICD-10-CM | POA: Insufficient documentation

## 2020-05-15 HISTORY — PX: CARDIOVERSION: SHX1299

## 2020-05-15 LAB — POCT I-STAT, CHEM 8
BUN: 13 mg/dL (ref 8–23)
Calcium, Ion: 1.08 mmol/L — ABNORMAL LOW (ref 1.15–1.40)
Chloride: 98 mmol/L (ref 98–111)
Creatinine, Ser: 0.7 mg/dL (ref 0.61–1.24)
Glucose, Bld: 85 mg/dL (ref 70–99)
HCT: 54 % — ABNORMAL HIGH (ref 39.0–52.0)
Hemoglobin: 18.4 g/dL — ABNORMAL HIGH (ref 13.0–17.0)
Potassium: 3 mmol/L — ABNORMAL LOW (ref 3.5–5.1)
Sodium: 143 mmol/L (ref 135–145)
TCO2: 29 mmol/L (ref 22–32)

## 2020-05-15 SURGERY — CARDIOVERSION
Anesthesia: General

## 2020-05-15 MED ORDER — POTASSIUM CHLORIDE CRYS ER 20 MEQ PO TBCR
40.0000 meq | EXTENDED_RELEASE_TABLET | Freq: Once | ORAL | Status: AC
  Administered 2020-05-15: 40 meq via ORAL

## 2020-05-15 MED ORDER — PROPOFOL 10 MG/ML IV BOLUS
INTRAVENOUS | Status: DC | PRN
  Administered 2020-05-15: 100 mg via INTRAVENOUS

## 2020-05-15 MED ORDER — SODIUM CHLORIDE 0.9 % IV SOLN
INTRAVENOUS | Status: AC | PRN
  Administered 2020-05-15: 500 mL via INTRAMUSCULAR

## 2020-05-15 MED ORDER — POTASSIUM CHLORIDE CRYS ER 20 MEQ PO TBCR: 20.0000 meq | EXTENDED_RELEASE_TABLET | Freq: Every day | ORAL | 1 refills | Status: DC

## 2020-05-15 MED ORDER — POTASSIUM CHLORIDE CRYS ER 20 MEQ PO TBCR
EXTENDED_RELEASE_TABLET | ORAL | Status: AC
  Filled 2020-05-15: qty 2

## 2020-05-15 MED ORDER — POTASSIUM CHLORIDE 10 MEQ/100ML IV SOLN
10.0000 meq | Freq: Once | INTRAVENOUS | Status: AC
  Administered 2020-05-15: 10 meq via INTRAVENOUS
  Filled 2020-05-15: qty 100

## 2020-05-15 MED ORDER — LIDOCAINE 2% (20 MG/ML) 5 ML SYRINGE
INTRAMUSCULAR | Status: DC | PRN
  Administered 2020-05-15: 80 mg via INTRAVENOUS

## 2020-05-15 NOTE — CV Procedure (Signed)
Electrical Cardioversion Procedure Note Joseph Larsen [id number] 1946-09-04  Procedure: Electrical Cardioversion Indications:  Atrial Fibrillation  Time Out: Verified patient identification, verified procedure,medications/allergies/relevent history reviewed, required imaging and test results available.  Performed  Procedure Details  The patient was NPO after midnight. Anesthesia was administered at the beside  by Dr.Miller with 100mg  of propofol and 80mg  Lidocaine.  Cardioversion was done with synchronized biphasic defibrillation with AP pads with 150watts.  The patient converted to normal sinus rhythm. The patient tolerated the procedure well   IMPRESSION:  Successful cardioversion of atrial fibrillation    Joseph Larsen 05/15/2020, 9:49 AM

## 2020-05-15 NOTE — Discharge Instructions (Signed)
Electrical Cardioversion Electrical cardioversion is the delivery of a jolt of electricity to restore a normal rhythm to the heart. A rhythm that is too fast or is not regular keeps the heart from pumping well. In this procedure, sticky patches or metal paddles are placed on the chest to deliver electricity to the heart from a device. This procedure may be done in an emergency if:  There is low or no blood pressure as a result of the heart rhythm.  Normal rhythm must be restored as fast as possible to protect the brain and heart from further damage.  It may save a life. This may also be a scheduled procedure for irregular or fast heart rhythms that are not immediately life-threatening. Tell a health care provider about:  Any allergies you have.  All medicines you are taking, including vitamins, herbs, eye drops, creams, and over-the-counter medicines.  Any problems you or family members have had with anesthetic medicines.  Any blood disorders you have.  Any surgeries you have had.  Any medical conditions you have.  Whether you are pregnant or may be pregnant. What are the risks? Generally, this is a safe procedure. However, problems may occur, including:  Allergic reactions to medicines.  A blood clot that breaks free and travels to other parts of your body.  The possible return of an abnormal heart rhythm within hours or days after the procedure.  Your heart stopping (cardiac arrest). This is rare. What happens before the procedure? Medicines  Your health care provider may have you start taking: ? Blood-thinning medicines (anticoagulants) so your blood does not clot as easily. ? Medicines to help stabilize your heart rate and rhythm.  Ask your health care provider about: ? Changing or stopping your regular medicines. This is especially important if you are taking diabetes medicines or blood thinners. ? Taking medicines such as aspirin and ibuprofen. These medicines can  thin your blood. Do not take these medicines unless your health care provider tells you to take them. ? Taking over-the-counter medicines, vitamins, herbs, and supplements. General instructions  Follow instructions from your health care provider about eating or drinking restrictions.  Plan to have someone take you home from the hospital or clinic.  If you will be going home right after the procedure, plan to have someone with you for 24 hours.  Ask your health care provider what steps will be taken to help prevent infection. These may include washing your skin with a germ-killing soap. What happens during the procedure?   An IV will be inserted into one of your veins.  Sticky patches (electrodes) or metal paddles may be placed on your chest.  You will be given a medicine to help you relax (sedative).  An electrical shock will be delivered. The procedure may vary among health care providers and hospitals. What can I expect after the procedure?  Your blood pressure, heart rate, breathing rate, and blood oxygen level will be monitored until you leave the hospital or clinic.  Your heart rhythm will be watched to make sure it does not change.  You may have some redness on the skin where the shocks were given. Follow these instructions at home:  Do not drive for 24 hours if you were given a sedative during your procedure.  Take over-the-counter and prescription medicines only as told by your health care provider.  Ask your health care provider how to check your pulse. Check it often.  Rest for 48 hours after the procedure or   as told by your health care provider.  Avoid or limit your caffeine use as told by your health care provider.  Keep all follow-up visits as told by your health care provider. This is important. Contact a health care provider if:  You feel like your heart is beating too quickly or your pulse is not regular.  You have a serious muscle cramp that does not go  away. Get help right away if:  You have discomfort in your chest.  You are dizzy or you feel faint.  You have trouble breathing or you are short of breath.  Your speech is slurred.  You have trouble moving an arm or leg on one side of your body.  Your fingers or toes turn cold or blue. Summary  Electrical cardioversion is the delivery of a jolt of electricity to restore a normal rhythm to the heart.  This procedure may be done right away in an emergency or may be a scheduled procedure if the condition is not an emergency.  Generally, this is a safe procedure.  After the procedure, check your pulse often as told by your health care provider. This information is not intended to replace advice given to you by your health care provider. Make sure you discuss any questions you have with your health care provider. Document Revised: 07/01/2019 Document Reviewed: 07/01/2019 Elsevier Patient Education  2020 [employer]  

## 2020-05-15 NOTE — Anesthesia Postprocedure Evaluation (Signed)
Anesthesia Post Note  Patient: Joseph Larsen  Procedure(s) Performed: CARDIOVERSION (N/A )     Patient location during evaluation: Endoscopy Anesthesia Type: General Level of consciousness: awake and alert Pain management: pain level controlled Vital Signs Assessment: post-procedure vital signs reviewed and stable Respiratory status: spontaneous breathing, nonlabored ventilation and respiratory function stable Cardiovascular status: blood pressure returned to baseline and stable Postop Assessment: no apparent nausea or vomiting Anesthetic complications: no    Last Vitals:  Vitals:   05/15/20 1305 05/15/20 1315  BP: 138/82 (!) 144/87  Pulse: (!) 58 (!) 55  Resp: (!) 21 17  Temp:    SpO2: 96% 97%    Last Pain:  Vitals:   05/15/20 1248  TempSrc: Axillary  PainSc: 0-No pain                 Lynda Rainwater

## 2020-05-15 NOTE — Anesthesia Preprocedure Evaluation (Signed)
Anesthesia Evaluation  Patient identified by MRN, date of birth, ID band Patient awake    Reviewed: Allergy & Precautions, NPO status , Patient's Chart, lab work & pertinent test results  Airway Mallampati: II  TM Distance: >3 FB Neck ROM: Full    Dental no notable dental hx.    Pulmonary neg pulmonary ROS, former smoker,    Pulmonary exam normal breath sounds clear to auscultation       Cardiovascular hypertension, Pt. on medications Normal cardiovascular exam+ dysrhythmias Atrial Fibrillation  Rhythm:Regular Rate:Normal     Neuro/Psych Anxiety Depression negative neurological ROS  negative psych ROS   GI/Hepatic Neg liver ROS, GERD  ,  Endo/Other  negative endocrine ROS  Renal/GU negative Renal ROS  negative genitourinary   Musculoskeletal negative musculoskeletal ROS (+)   Abdominal   Peds negative pediatric ROS (+)  Hematology negative hematology ROS (+)   Anesthesia Other Findings   Reproductive/Obstetrics negative OB ROS                             Anesthesia Physical Anesthesia Plan  ASA: III  Anesthesia Plan: General   Post-op Pain Management:    Induction: Intravenous  PONV Risk Score and Plan: 2 and Treatment may vary due to age or medical condition  Airway Management Planned: Mask  Additional Equipment:   Intra-op Plan:   Post-operative Plan:   Informed Consent: I have reviewed the patients History and Physical, chart, labs and discussed the procedure including the risks, benefits and alternatives for the proposed anesthesia with the patient or authorized representative who has indicated his/her understanding and acceptance.     Dental advisory given  Plan Discussed with: CRNA  Anesthesia Plan Comments:         Anesthesia Quick Evaluation

## 2020-05-15 NOTE — Interval H&P Note (Signed)
History and Physical Interval Note:  05/15/2020 10:20 AM  Joseph Larsen  has presented today for surgery, with the diagnosis of AFIB.  The various methods of treatment have been discussed with the patient and family. After consideration of risks, benefits and other options for treatment, the patient has consented to  Procedure(s): CARDIOVERSION (N/A) as a surgical intervention.  The patient's history has been reviewed, patient examined, no change in status, stable for surgery.  I have reviewed the patient's chart and labs.  Questions were answered to the patient's satisfaction.     [name]

## 2020-05-15 NOTE — Transfer of Care (Signed)
Immediate Anesthesia Transfer of Care Note  Patient: Joseph Larsen  Procedure(s) Performed: CARDIOVERSION (N/A )  Patient Location: Endoscopy Unit  Anesthesia Type:General  Level of Consciousness: drowsy  Airway & Oxygen Therapy: Patient Spontanous Breathing  Post-op Assessment: Report given to RN and Post -op Vital signs reviewed and stable  Post vital signs: Reviewed and stable  Last Vitals:  Vitals Value Taken Time  BP 133/85   Temp    Pulse 58   Resp 16   SpO2 98     Last Pain:  Vitals:   05/15/20 0958  TempSrc: Oral  PainSc:          Complications: No apparent anesthesia complications

## 2020-05-15 NOTE — Interval H&P Note (Signed)
History and Physical Interval Note:  05/15/2020 9:48 AM  Joseph Larsen  has presented today for surgery, with the diagnosis of AFIB.  The various methods of treatment have been discussed with the patient and family. After consideration of risks, benefits and other options for treatment, the patient has consented to  Procedure(s): CARDIOVERSION (N/A) as a surgical intervention.  The patient's history has been reviewed, patient examined, no change in status, stable for surgery.  I have reviewed the patient's chart and labs.  Questions were answered to the patient's satisfaction.     [name]

## 2020-05-18 ENCOUNTER — Ambulatory Visit (INDEPENDENT_AMBULATORY_CARE_PROVIDER_SITE_OTHER): Payer: Medicare Other | Admitting: Cardiology

## 2020-05-18 ENCOUNTER — Other Ambulatory Visit: Payer: Medicare Other

## 2020-05-18 ENCOUNTER — Other Ambulatory Visit (HOSPITAL_COMMUNITY): Payer: Self-pay | Admitting: *Deleted

## 2020-05-18 ENCOUNTER — Encounter: Payer: Self-pay | Admitting: Cardiology

## 2020-05-18 ENCOUNTER — Telehealth (HOSPITAL_COMMUNITY): Payer: Self-pay | Admitting: Nurse Practitioner

## 2020-05-18 ENCOUNTER — Other Ambulatory Visit: Payer: Self-pay

## 2020-05-18 VITALS — BP 114/92 | HR 60 | Ht 71.0 in | Wt 182.8 lb

## 2020-05-18 DIAGNOSIS — I4819 Other persistent atrial fibrillation: Secondary | ICD-10-CM | POA: Diagnosis not present

## 2020-05-18 DIAGNOSIS — I10 Essential (primary) hypertension: Secondary | ICD-10-CM

## 2020-05-18 LAB — BASIC METABOLIC PANEL
BUN/Creatinine Ratio: 16 (ref 10–24)
BUN: 14 mg/dL (ref 8–27)
CO2: 25 mmol/L (ref 20–29)
Calcium: 9.8 mg/dL (ref 8.6–10.2)
Chloride: 100 mmol/L (ref 96–106)
Creatinine, Ser: 0.86 mg/dL (ref 0.76–1.27)
GFR calc Af Amer: 100 mL/min/{1.73_m2} (ref >59)
GFR calc non Af Amer: 87 mL/min/{1.73_m2} (ref >59)
Glucose: 96 mg/dL (ref 65–99)
Potassium: 3.6 mmol/L (ref 3.5–5.2)
Sodium: 141 mmol/L (ref 134–144)

## 2020-05-18 LAB — MAGNESIUM: Magnesium: 1.7 mg/dL (ref 1.6–2.3)

## 2020-05-18 MED ORDER — FLECAINIDE ACETATE 50 MG PO TABS: 50.0000 mg | ORAL_TABLET | Freq: Two times a day (BID) | ORAL | 6 refills | Status: DC

## 2020-05-18 NOTE — Patient Instructions (Signed)
Medication Instructions:   Your physician has recommended you make the following change in your medication:   1) Start Flecainide 50 mg, 1 tablet by mouth twice a day  *If you need a refill on your cardiac medications before your next appointment, please call your pharmacy*  Lab Work:  None ordered today  Testing/Procedures:  Your physician has requested that you have en exercise stress myoview in 2 weeks. For further information please visit [email or web address]. Please follow instruction sheet, as given.  Follow-Up: At [facility], you and your health needs are our priority.  As part of our continuing mission to provide you with exceptional heart care, we have created designated Provider Care Teams.  These Care Teams include your primary Cardiologist (physician) and Advanced Practice Providers (APPs -  Physician Assistants and Nurse Practitioners) who all work together to provide you with the care you need, when you need it.  We recommend signing up for the patient portal called "MyChart".  Sign up information is provided on this After Visit Summary.  MyChart is used to connect with patients for Virtual Visits (Telemedicine).  Patients are able to view lab/test results, encounter notes, upcoming appointments, etc.  Non-urgent messages can be sent to your provider as well.   To learn more about what you can do with MyChart, go to [email or web address].    Your next appointment:   3 month(s)  The format for your next appointment:   In Person  Provider:   Jenkins Rouge, MD

## 2020-05-18 NOTE — Telephone Encounter (Signed)
Called and left message for patient to call back to make 1 week f/u for flecainide start per Rushie Goltz, RN.

## 2020-05-19 ENCOUNTER — Telehealth: Payer: Self-pay | Admitting: Cardiovascular Disease

## 2020-05-19 ENCOUNTER — Other Ambulatory Visit (HOSPITAL_COMMUNITY): Payer: Medicare Other

## 2020-05-19 NOTE — Telephone Encounter (Signed)
Called patient back. Patient stated he could do the test next Friday on 05/29/20, which would be in the window to have test done after starting flecainide.

## 2020-05-19 NOTE — Telephone Encounter (Signed)
Patient is requesting to reschedule COVID-19 test previously scheduled for today, 05/19/20 due to rescheduling Myocardial Perfusion for 06/16/20. Please call to assist with rescheduling.

## 2020-05-19 NOTE — Telephone Encounter (Signed)
Left message for patient to call back  

## 2020-05-19 NOTE — Telephone Encounter (Signed)
Follow up ° ° °Patient is returning your call. Please call. ° ° ° °

## 2020-05-22 ENCOUNTER — Inpatient Hospital Stay (HOSPITAL_COMMUNITY): Admission: RE | Admit: 2020-05-22 | Payer: Medicare Other | Source: Ambulatory Visit

## 2020-05-25 ENCOUNTER — Other Ambulatory Visit (HOSPITAL_COMMUNITY): Payer: Medicare Other

## 2020-05-26 ENCOUNTER — Other Ambulatory Visit: Payer: Self-pay

## 2020-05-26 ENCOUNTER — Other Ambulatory Visit (HOSPITAL_COMMUNITY): Payer: Medicare Other

## 2020-05-26 ENCOUNTER — Ambulatory Visit (HOSPITAL_COMMUNITY)
Admission: RE | Admit: 2020-05-26 | Discharge: 2020-05-26 | Disposition: A | Discharge status: Home / Self Care | Payer: Medicare Other | Source: Ambulatory Visit | Attending: Nurse Practitioner | Admitting: Nurse Practitioner

## 2020-05-26 VITALS — BP 120/72 | HR 67 | Ht 71.0 in | Wt 181.8 lb

## 2020-05-26 DIAGNOSIS — K219 Gastro-esophageal reflux disease without esophagitis: Secondary | ICD-10-CM | POA: Diagnosis not present

## 2020-05-26 DIAGNOSIS — Z79899 Other long term (current) drug therapy: Secondary | ICD-10-CM | POA: Diagnosis not present

## 2020-05-26 DIAGNOSIS — Z8249 Family history of ischemic heart disease and other diseases of the circulatory system: Secondary | ICD-10-CM | POA: Diagnosis not present

## 2020-05-26 DIAGNOSIS — J309 Allergic rhinitis, unspecified: Secondary | ICD-10-CM | POA: Diagnosis not present

## 2020-05-26 DIAGNOSIS — Z87891 Personal history of nicotine dependence: Secondary | ICD-10-CM | POA: Insufficient documentation

## 2020-05-26 DIAGNOSIS — I4819 Other persistent atrial fibrillation: Secondary | ICD-10-CM | POA: Insufficient documentation

## 2020-05-26 DIAGNOSIS — F419 Anxiety disorder, unspecified: Secondary | ICD-10-CM | POA: Diagnosis not present

## 2020-05-26 DIAGNOSIS — K449 Diaphragmatic hernia without obstruction or gangrene: Secondary | ICD-10-CM | POA: Insufficient documentation

## 2020-05-26 DIAGNOSIS — Z7901 Long term (current) use of anticoagulants: Secondary | ICD-10-CM | POA: Diagnosis not present

## 2020-05-26 DIAGNOSIS — Z880 Allergy status to penicillin: Secondary | ICD-10-CM | POA: Diagnosis not present

## 2020-05-26 DIAGNOSIS — F329 Major depressive disorder, single episode, unspecified: Secondary | ICD-10-CM | POA: Diagnosis not present

## 2020-05-26 DIAGNOSIS — M109 Gout, unspecified: Secondary | ICD-10-CM | POA: Insufficient documentation

## 2020-05-26 DIAGNOSIS — Z8719 Personal history of other diseases of the digestive system: Secondary | ICD-10-CM | POA: Diagnosis not present

## 2020-05-26 DIAGNOSIS — F4310 Post-traumatic stress disorder, unspecified: Secondary | ICD-10-CM | POA: Insufficient documentation

## 2020-05-26 DIAGNOSIS — H269 Unspecified cataract: Secondary | ICD-10-CM | POA: Insufficient documentation

## 2020-05-26 DIAGNOSIS — Z888 Allergy status to other drugs, medicaments and biological substances status: Secondary | ICD-10-CM | POA: Diagnosis not present

## 2020-05-26 DIAGNOSIS — I10 Essential (primary) hypertension: Secondary | ICD-10-CM | POA: Insufficient documentation

## 2020-05-26 DIAGNOSIS — D6869 Other thrombophilia: Secondary | ICD-10-CM

## 2020-05-26 LAB — BASIC METABOLIC PANEL
Anion gap: 9 (ref 5–15)
BUN: 11 mg/dL (ref 8–23)
CO2: 28 mmol/L (ref 22–32)
Calcium: 9.2 mg/dL (ref 8.9–10.3)
Chloride: 105 mmol/L (ref 98–111)
Creatinine, Ser: 0.9 mg/dL (ref 0.61–1.24)
GFR calc Af Amer: >60 mL/min (ref >60)
GFR calc non Af Amer: >60 mL/min (ref >60)
Glucose, Bld: 93 mg/dL (ref 70–99)
Potassium: 3.4 mmol/L — ABNORMAL LOW (ref 3.5–5.1)
Sodium: 142 mmol/L (ref 135–145)

## 2020-05-26 LAB — CBC
HCT: 52.6 % — ABNORMAL HIGH (ref 39.0–52.0)
Hemoglobin: 17.9 g/dL — ABNORMAL HIGH (ref 13.0–17.0)
MCH: 33.1 pg (ref 26.0–34.0)
MCHC: 34 g/dL (ref 30.0–36.0)
MCV: 97.2 fL (ref 80.0–100.0)
Platelets: 137 10*3/uL — ABNORMAL LOW (ref 150–400)
RBC: 5.41 MIL/uL (ref 4.22–5.81)
RDW: 12.5 % (ref 11.5–15.5)
WBC: 6.2 10*3/uL (ref 4.0–10.5)
nRBC: 0 % (ref 0.0–0.2)

## 2020-05-26 MED ORDER — FLECAINIDE ACETATE 50 MG PO TABS
100.0000 mg | ORAL_TABLET | Freq: Two times a day (BID) | ORAL | 6 refills | Status: DC
Start: 2020-05-26 — End: 2020-08-05

## 2020-05-26 NOTE — Progress Notes (Addendum)
Primary Care Physician: [name], DO Referring Physician: Dr. Vella Kohler is a 74 y.o. male with a h/o paroxysmal afib on eliquis 5 mg bid for a CHA2DS2VASc score of 2. I saw pt last in 2019 when his atenolol was adjusted for afib and he has been in rhythm until recently.  He was seen by Dr. Johnsie Cancel for a check-up recently and mentioned that he did feel as well for a couple of months. A monitor was placed and now he is in the afib clinic to be set up for cardioversion. No missed eliquis for at least 3 weeks. No  known trigger. He has had both covid shots.  F/u in afib clinic, 6/15. He had a successful cardioversion but had ERAF on f/u with Kathyrn Drown, NP. She discussed with Dr. Verlin Grills recommended start of flecainide 50 mg bid and he is also scheduled for a stress test on Friday. He is tolerating the 50 mg flecainide well. I plan  to increase to 100 mg bid and schedule for a cardioversion on Monday. No missed anticoagulation. He has had both covid shots.  Today, he denies symptoms of palpitations, chest pain, shortness of breath, orthopnea, PND, lower extremity edema, dizziness, presyncope, syncope, or neurologic sequela. The patient is tolerating medications without difficulties and is otherwise without complaint today.   Past Medical History:  Diagnosis Date  . Abnormal ECG    a. 08/2012 Abnl ETT with ST changes in recovery;  b. 08/2012 Ex MV, ex time 10:00, EF 66%, no ischemia/infarct.  . Allergic rhinitis   . Anxiety   . Cataract   . Depression   . Diverticular disease   . Dyspnea on exertion   . ETOH abuse   . GERD (gastroesophageal reflux disease)   . Gout   . Hemorrhoids   . Hiatal hernia   . HTN (hypertension)   . Low testosterone   . MACROCYTIC ANEMIA   . NONSPEC ELEVATION OF LEVELS OF TRANSAMINASE/LDH   . Nonspecific abnormal finding in stool contents   . Palpitations   . PTSD (post-traumatic stress disorder)   . PTSD (post-traumatic stress  disorder)    [name]   Past Surgical History:  Procedure Laterality Date  . CARDIOVERSION N/A 05/15/2020   Procedure: CARDIOVERSION;  Surgeon: Sueanne Margarita, MD;  Location: [facility] ENDOSCOPY;  Service: Cardiovascular;  Laterality: N/A;  . CHOLECYSTECTOMY  2004  . KNEE SURGERY Bilateral   . SHOULDER ARTHROSCOPY Right 01/14/2020    Current Outpatient Medications  Medication Sig Dispense Refill  . ALPRAZolam (XANAX) 0.5 MG tablet Take 0.25 mg by mouth at bedtime. Outside provider    . amLODipine (NORVASC) 10 MG tablet Take 5 mg by mouth daily.     Marland Kitchen apixaban (ELIQUIS) 5 MG TABS tablet Take 1 tablet (5 mg total) by mouth 2 (two) times daily. 60 tablet 0  . atenolol (TENORMIN) 25 MG tablet Take 1 tablet (25 mg total) by mouth 2 (two) times daily. 60 tablet 3  . cholecalciferol (VITAMIN D) 1000 UNITS tablet Take 1,000 Units by mouth daily.    . flecainide (TAMBOCOR) 50 MG tablet Take 2 tablets (100 mg total) by mouth 2 (two) times daily. 60 tablet 6  . hydrochlorothiazide (HYDRODIURIL) 25 MG tablet Take 25 mg by mouth daily.     Marland Kitchen losartan (COZAAR) 100 MG tablet Take 50 mg by mouth daily.    . Misc Natural Products (OSTEO BI-FLEX/5-LOXIN ADVANCED PO) Take 1  tablet by mouth daily.     . Omega-3 Fatty Acids (FISH OIL) 1000 MG CAPS Take 2 capsules by mouth in the morning and at bedtime.     . potassium chloride SA (KLOR-CON) 20 MEQ tablet Take 1 tablet (20 mEq total) by mouth daily. 30 tablet 1  . QUEtiapine (SEROQUEL) 100 MG tablet Take 25 mg by mouth at bedtime. Pt only takes 1/4 tab     . omeprazole (PRILOSEC) 20 MG capsule Take 40 mg by mouth in the morning and at bedtime.  (Patient not taking: Reported on 05/26/2020)     Current Facility-Administered Medications  Medication Dose Route Frequency Provider Last Rate Last Admin  . cyanocobalamin ((VITAMIN B-12)) injection 1,000 mcg  1,000 mcg Intramuscular Q30 days Timmothy Euler, MD   1,000 mcg at 02/13/18 0932    Allergies   Allergen Reactions  . Terazosin Rash    Rash when in sun  . Enalapril Maleate     REACTION: rash  . Enalapril Maleate Rash    REACTION: rash  . Lisinopril Rash and Itching  . Penicillins Rash and Itching    REACTION: rash, itching REACTION: rash, itching    Social History   Socioeconomic History  . Marital status: Married    Spouse name: Suanne Marker  . Number of children: 1  . Years of education: Not on file  . Highest education level: Not on file  Occupational History  . Occupation: disabled    Employer: RETIRED    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  Tobacco Use  . Smoking status: Former Smoker    Packs/day: 3.00    Types: Cigarettes    Start date: 02/09/1965    Quit date: 05/12/1982    Years since quitting: 38.0  . Smokeless tobacco: Never Used  Vaping Use  . Vaping Use: Never used  Substance and Sexual Activity  . Alcohol use: Yes    Alcohol/week: 14.0 standard drinks    Types: 14 Cans of beer per week    Comment: drinks 2-3 cans of beer/day    . Drug use: No    Comment: 1 cup of decaf coffee/day.  Marland Kitchen Sexual activity: Yes  Other Topics Concern  . Not on file  Social History Narrative   Lives with wife in [place] in a one story home.  Exercises @ gym 3x/wk - stationary bike and light weight training.   Social Determinants of Health   Financial Resource Strain:   . Difficulty of Paying Living Expenses:   Food Insecurity:   . Worried About Charity fundraiser in the Last Year:   . Arboriculturist in the Last Year:   Transportation Needs:   . Film/video editor (Medical):   Marland Kitchen Lack of Transportation (Non-Medical):   Physical Activity:   . Days of Exercise per Week:   . Minutes of Exercise per Session:   Stress:   . Feeling of Stress :   Social Connections:   . Frequency of Communication with Friends and Family:   . Frequency of Social Gatherings with Friends and Family:   . Attends Religious Services:   . Active Member of Clubs or  Organizations:   . Attends Archivist Meetings:   Marland Kitchen Marital Status:   Intimate Partner Violence:   . Fear of Current or Ex-Partner:   . Emotionally Abused:   Marland Kitchen Physically Abused:   . Sexually Abused:     Family History  Problem Relation Age of Onset  .  Heart disease Mother        "died of chf"  . Heart disease Father        "died of old age"  . Hypertension Sister   . Liver disease Brother   . Hypertension Brother   . Hypertension Sister   . Hypertension Sister   . Hypertension Sister   . Heart murmur Sister   . Colon cancer Neg Hx     ROS- All systems are reviewed and negative except as per the HPI above  Physical Exam: Vitals:   05/26/20 1030  BP: 120/72  Pulse: 67  Weight: 82.5 kg  Height: 5\' 11"  (1.803 m)   Wt Readings from Last 3 Encounters:  05/26/20 82.5 kg  05/18/20 82.9 kg  05/15/20 85.5 kg    Labs: Lab Results  Component Value Date   NA 141 05/18/2020   K 3.6 05/18/2020   CL 100 05/18/2020   CO2 25 05/18/2020   GLUCOSE 96 05/18/2020   BUN 14 05/18/2020   CREATININE 0.86 05/18/2020   CALCIUM 9.8 05/18/2020   MG 1.7 05/18/2020   Lab Results  Component Value Date   INR 1.1 ratio (H) 08/09/2010   Lab Results  Component Value Date   CHOL 98 (L) 04/13/2020   HDL 28 (L) 04/13/2020   LDLCALC 55 04/13/2020   TRIG 68 04/13/2020     GEN- The patient is well appearing, alert and oriented x 3 today.   Head- normocephalic, atraumatic Eyes-  Sclera clear, conjunctiva pink Ears- hearing intact Oropharynx- clear Neck- supple, no JVP Lymph- no cervical lymphadenopathy Lungs- Clear to ausculation bilaterally, normal work of breathing Heart- irregular rate and rhythm, no murmurs, rubs or gallops, PMI not laterally displaced GI- soft, NT, ND, + BS Extremities- no clubbing, cyanosis, or edema MS- no significant deformity or atrophy Skin- no rash or lesion Psych- euthymic mood, full affect Neuro- strength and sensation are  intact  EKG-afib at 63 bpm, qrs int 99 ms, qtc at 429 ms Zio patch reviewed which showed afib 100% of the time worn.     Assessment and Plan: 1. Persistent  afib  Had been in afib possibly for the last 2-3 months Successful cardioversion 6/4  but ERAF Now has been started on 50 mg flecainide bid  Will place on increasing to 100 mg bid with repeat cardioversion 6/21 Continue  usual dose of atenolol He has a myoview pending on Friday, will watch for results and if abnormal, will have to stop flecainide  Cbc/bmet today   2. CHA2DS2VASc score of 2 States no missed doses of eliquis 5 mg bid for at least 3 weeks, reminded not to miss any doses  F/u after cardioversion, pt is planning to leave for the beach the day after cardioversion and will see back in 2 weeks   Addendum: 7/21- I saw results of Myoview Friday PM which showed low risk test but possible prior inferior infarct, no ischemia. I discussed with Dr. Johnsie Cancel, who said to continue flecainide for now. If pt does cardiovert and stays in SR, he plans to order a Cardiac CT to further evaluate. If DCCV is not successful, he would stop flecainide, He wanted to have a f/u with pt and I will request this appointment.   Geroge Baseman Daizha Anand, [facility] [address] [place], [place] [address] [phone]

## 2020-05-26 NOTE — H&P (View-Only) (Signed)
Primary Care Physician: [name], DO Referring Physician: Dr. Vella Kohler is a 74 y.o. male with a h/o paroxysmal afib on eliquis 5 mg bid for a CHA2DS2VASc score of 2. I saw pt last in 2019 when his atenolol was adjusted for afib and he has been in rhythm until recently.  He was seen by Dr. Johnsie Cancel for a check-up recently and mentioned that he did feel as well for a couple of months. A monitor was placed and now he is in the afib clinic to be set up for cardioversion. No missed eliquis for at least 3 weeks. No  known trigger. He has had both covid shots.  F/u in afib clinic, 6/15. He had a successful cardioversion but had ERAF on f/u with Kathyrn Drown, NP. She discussed with Dr. Verlin Grills recommended start of flecainide 50 mg bid and he is also scheduled for a stress test on Friday. He is tolerating the 50 mg flecainide well. I plan  to increase to 100 mg bid and schedule for a cardioversion on Monday. No missed anticoagulation. He has had both covid shots.  Today, he denies symptoms of palpitations, chest pain, shortness of breath, orthopnea, PND, lower extremity edema, dizziness, presyncope, syncope, or neurologic sequela. The patient is tolerating medications without difficulties and is otherwise without complaint today.   Past Medical History:  Diagnosis Date  . Abnormal ECG    a. 08/2012 Abnl ETT with ST changes in recovery;  b. 08/2012 Ex MV, ex time 10:00, EF 66%, no ischemia/infarct.  . Allergic rhinitis   . Anxiety   . Cataract   . Depression   . Diverticular disease   . Dyspnea on exertion   . ETOH abuse   . GERD (gastroesophageal reflux disease)   . Gout   . Hemorrhoids   . Hiatal hernia   . HTN (hypertension)   . Low testosterone   . MACROCYTIC ANEMIA   . NONSPEC ELEVATION OF LEVELS OF TRANSAMINASE/LDH   . Nonspecific abnormal finding in stool contents   . Palpitations   . PTSD (post-traumatic stress disorder)   . PTSD (post-traumatic stress  disorder)    [name]   Past Surgical History:  Procedure Laterality Date  . CARDIOVERSION N/A 05/15/2020   Procedure: CARDIOVERSION;  Surgeon: Sueanne Margarita, MD;  Location: [facility] ENDOSCOPY;  Service: Cardiovascular;  Laterality: N/A;  . CHOLECYSTECTOMY  2004  . KNEE SURGERY Bilateral   . SHOULDER ARTHROSCOPY Right 01/14/2020    Current Outpatient Medications  Medication Sig Dispense Refill  . ALPRAZolam (XANAX) 0.5 MG tablet Take 0.25 mg by mouth at bedtime. Outside provider    . amLODipine (NORVASC) 10 MG tablet Take 5 mg by mouth daily.     Marland Kitchen apixaban (ELIQUIS) 5 MG TABS tablet Take 1 tablet (5 mg total) by mouth 2 (two) times daily. 60 tablet 0  . atenolol (TENORMIN) 25 MG tablet Take 1 tablet (25 mg total) by mouth 2 (two) times daily. 60 tablet 3  . cholecalciferol (VITAMIN D) 1000 UNITS tablet Take 1,000 Units by mouth daily.    . flecainide (TAMBOCOR) 50 MG tablet Take 2 tablets (100 mg total) by mouth 2 (two) times daily. 60 tablet 6  . hydrochlorothiazide (HYDRODIURIL) 25 MG tablet Take 25 mg by mouth daily.     Marland Kitchen losartan (COZAAR) 100 MG tablet Take 50 mg by mouth daily.    . Misc Natural Products (OSTEO BI-FLEX/5-LOXIN ADVANCED PO) Take 1  tablet by mouth daily.     . Omega-3 Fatty Acids (FISH OIL) 1000 MG CAPS Take 2 capsules by mouth in the morning and at bedtime.     . potassium chloride SA (KLOR-CON) 20 MEQ tablet Take 1 tablet (20 mEq total) by mouth daily. 30 tablet 1  . QUEtiapine (SEROQUEL) 100 MG tablet Take 25 mg by mouth at bedtime. Pt only takes 1/4 tab     . omeprazole (PRILOSEC) 20 MG capsule Take 40 mg by mouth in the morning and at bedtime.  (Patient not taking: Reported on 05/26/2020)     Current Facility-Administered Medications  Medication Dose Route Frequency Provider Last Rate Last Admin  . cyanocobalamin ((VITAMIN B-12)) injection 1,000 mcg  1,000 mcg Intramuscular Q30 days Timmothy Euler, MD   1,000 mcg at 02/13/18 0932    Allergies    Allergen Reactions  . Terazosin Rash    Rash when in sun  . Enalapril Maleate     REACTION: rash  . Enalapril Maleate Rash    REACTION: rash  . Lisinopril Rash and Itching  . Penicillins Rash and Itching    REACTION: rash, itching REACTION: rash, itching    Social History   Socioeconomic History  . Marital status: Married    Spouse name: Suanne Marker  . Number of children: 1  . Years of education: Not on file  . Highest education level: Not on file  Occupational History  . Occupation: disabled    Employer: RETIRED    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  Tobacco Use  . Smoking status: Former Smoker    Packs/day: 3.00    Types: Cigarettes    Start date: 02/09/1965    Quit date: 05/12/1982    Years since quitting: 38.0  . Smokeless tobacco: Never Used  Vaping Use  . Vaping Use: Never used  Substance and Sexual Activity  . Alcohol use: Yes    Alcohol/week: 14.0 standard drinks    Types: 14 Cans of beer per week    Comment: drinks 2-3 cans of beer/day    . Drug use: No    Comment: 1 cup of decaf coffee/day.  Marland Kitchen Sexual activity: Yes  Other Topics Concern  . Not on file  Social History Narrative   Lives with wife in [place] in a one story home.  Exercises @ gym 3x/wk - stationary bike and light weight training.   Social Determinants of Health   Financial Resource Strain:   . Difficulty of Paying Living Expenses:   Food Insecurity:   . Worried About Charity fundraiser in the Last Year:   . Arboriculturist in the Last Year:   Transportation Needs:   . Film/video editor (Medical):   Marland Kitchen Lack of Transportation (Non-Medical):   Physical Activity:   . Days of Exercise per Week:   . Minutes of Exercise per Session:   Stress:   . Feeling of Stress :   Social Connections:   . Frequency of Communication with Friends and Family:   . Frequency of Social Gatherings with Friends and Family:   . Attends Religious Services:   . Active Member of Clubs or  Organizations:   . Attends Archivist Meetings:   Marland Kitchen Marital Status:   Intimate Partner Violence:   . Fear of Current or Ex-Partner:   . Emotionally Abused:   Marland Kitchen Physically Abused:   . Sexually Abused:     Family History  Problem Relation Age of Onset  .  Heart disease Mother        "died of chf"  . Heart disease Father        "died of old age"  . Hypertension Sister   . Liver disease Brother   . Hypertension Brother   . Hypertension Sister   . Hypertension Sister   . Hypertension Sister   . Heart murmur Sister   . Colon cancer Neg Hx     ROS- All systems are reviewed and negative except as per the HPI above  Physical Exam: Vitals:   05/26/20 1030  BP: 120/72  Pulse: 67  Weight: 82.5 kg  Height: 5\' 11"  (1.803 m)   Wt Readings from Last 3 Encounters:  05/26/20 82.5 kg  05/18/20 82.9 kg  05/15/20 85.5 kg    Labs: Lab Results  Component Value Date   NA 141 05/18/2020   K 3.6 05/18/2020   CL 100 05/18/2020   CO2 25 05/18/2020   GLUCOSE 96 05/18/2020   BUN 14 05/18/2020   CREATININE 0.86 05/18/2020   CALCIUM 9.8 05/18/2020   MG 1.7 05/18/2020   Lab Results  Component Value Date   INR 1.1 ratio (H) 08/09/2010   Lab Results  Component Value Date   CHOL 98 (L) 04/13/2020   HDL 28 (L) 04/13/2020   LDLCALC 55 04/13/2020   TRIG 68 04/13/2020     GEN- The patient is well appearing, alert and oriented x 3 today.   Head- normocephalic, atraumatic Eyes-  Sclera clear, conjunctiva pink Ears- hearing intact Oropharynx- clear Neck- supple, no JVP Lymph- no cervical lymphadenopathy Lungs- Clear to ausculation bilaterally, normal work of breathing Heart- irregular rate and rhythm, no murmurs, rubs or gallops, PMI not laterally displaced GI- soft, NT, ND, + BS Extremities- no clubbing, cyanosis, or edema MS- no significant deformity or atrophy Skin- no rash or lesion Psych- euthymic mood, full affect Neuro- strength and sensation are  intact  EKG-afib at 63 bpm, qrs int 99 ms, qtc at 429 ms Zio patch reviewed which showed afib 100% of the time worn.     Assessment and Plan: 1. Persistent  afib  Had been in afib possibly for the last 2-3 months Successful cardioversion 6/4  but ERAF Now has been started on 50 mg flecainide bid  Will place on increasing to 100 mg bid with repeat cardioversion 6/21 Continue  usual dose of atenolol He has a myoview pending on Friday, will watch for results and if abnormal, will have to stop flecainide  Cbc/bmet today   2. CHA2DS2VASc score of 2 States no missed doses of eliquis 5 mg bid for at least 3 weeks, reminded not to miss any doses  F/u after cardioversion, pt is planning to leave for the beach the day after cardioversion and will see back in 2 weeks   Addendum: 7/21- I saw results of Myoview Friday PM which showed low risk test but possible prior inferior infarct, no ischemia. I discussed with Dr. Johnsie Cancel, who said to continue flecainide for now. If pt does cardiovert and stays in SR, he plans to order a Cardiac CT to further evaluate. If DCCV is not successful, he would stop flecainide, He wanted to have a f/u with pt and I will request this appointment.   Geroge Baseman Danetra Glock, [facility] [address] [place], [place] [address] [phone]

## 2020-05-26 NOTE — Patient Instructions (Signed)
Cardioversion scheduled for Monday, June 21st  - Arrive at the [facility] and go to admitting at 8:30am  - Do not eat or drink anything after midnight the night prior to your procedure.  - Take all your morning medication (except diabetic medications) with a sip of water prior to arrival.  - You will not be able to drive home after your procedure.  - Do NOT miss any doses of your blood thinner - if you should miss a dose please notify our office immediately.  Increase flecainide to 100mg  twice a day

## 2020-05-27 ENCOUNTER — Telehealth (HOSPITAL_COMMUNITY): Payer: Self-pay

## 2020-05-27 ENCOUNTER — Other Ambulatory Visit (HOSPITAL_COMMUNITY): Payer: Self-pay | Admitting: *Deleted

## 2020-05-27 MED ORDER — POTASSIUM CHLORIDE CRYS ER 20 MEQ PO TBCR: 20.0000 meq | EXTENDED_RELEASE_TABLET | Freq: Two times a day (BID) | ORAL | 3 refills | Status: DC

## 2020-05-27 NOTE — Telephone Encounter (Signed)
Encounter complete. 

## 2020-05-29 ENCOUNTER — Other Ambulatory Visit (HOSPITAL_COMMUNITY)
Admission: RE | Admit: 2020-05-29 | Discharge: 2020-05-29 | Disposition: A | Discharge status: Home / Self Care | Payer: Medicare Other | Source: Ambulatory Visit | Attending: Internal Medicine | Admitting: Internal Medicine

## 2020-05-29 ENCOUNTER — Other Ambulatory Visit: Payer: Self-pay

## 2020-05-29 ENCOUNTER — Ambulatory Visit (HOSPITAL_COMMUNITY)
Admission: RE | Admit: 2020-05-29 | Discharge: 2020-05-29 | Disposition: A | Discharge status: Home / Self Care | Payer: Medicare Other | Source: Ambulatory Visit | Attending: Cardiovascular Disease | Admitting: Cardiovascular Disease

## 2020-05-29 DIAGNOSIS — R002 Palpitations: Secondary | ICD-10-CM | POA: Diagnosis not present

## 2020-05-29 DIAGNOSIS — I4819 Other persistent atrial fibrillation: Secondary | ICD-10-CM

## 2020-05-29 DIAGNOSIS — Z01818 Encounter for other preprocedural examination: Secondary | ICD-10-CM | POA: Insufficient documentation

## 2020-05-29 DIAGNOSIS — Z87891 Personal history of nicotine dependence: Secondary | ICD-10-CM | POA: Diagnosis not present

## 2020-05-29 DIAGNOSIS — I10 Essential (primary) hypertension: Secondary | ICD-10-CM | POA: Insufficient documentation

## 2020-05-29 DIAGNOSIS — R5383 Other fatigue: Secondary | ICD-10-CM | POA: Diagnosis not present

## 2020-05-29 DIAGNOSIS — R0609 Other forms of dyspnea: Secondary | ICD-10-CM | POA: Diagnosis not present

## 2020-05-29 DIAGNOSIS — Z20822 Contact with and (suspected) exposure to covid-19: Secondary | ICD-10-CM | POA: Insufficient documentation

## 2020-05-29 DIAGNOSIS — F329 Major depressive disorder, single episode, unspecified: Secondary | ICD-10-CM | POA: Diagnosis not present

## 2020-05-29 DIAGNOSIS — I4891 Unspecified atrial fibrillation: Secondary | ICD-10-CM | POA: Diagnosis not present

## 2020-05-29 DIAGNOSIS — I2510 Atherosclerotic heart disease of native coronary artery without angina pectoris: Secondary | ICD-10-CM | POA: Diagnosis not present

## 2020-05-29 LAB — MYOCARDIAL PERFUSION IMAGING
Estimated workload: 9.4 METS
Exercise duration (min): 7 min
Exercise duration (sec): 36 s
MPHR: 148 {beats}/min
Peak HR: 142 {beats}/min
Percent HR: 95 %
Rest HR: 89 {beats}/min
SDS: 3
SRS: 4
SSS: 7
TID: 1.1

## 2020-05-29 MED ORDER — TECHNETIUM TC 99M TETROFOSMIN IV KIT
9.9000 | PACK | Freq: Once | INTRAVENOUS | Status: AC | PRN
  Administered 2020-05-29: 9.9 via INTRAVENOUS
  Filled 2020-05-29: qty 10

## 2020-05-29 MED ORDER — TECHNETIUM TC 99M TETROFOSMIN IV KIT
31.0000 | PACK | Freq: Once | INTRAVENOUS | Status: AC | PRN
  Administered 2020-05-29: 31 via INTRAVENOUS
  Filled 2020-05-29: qty 31

## 2020-05-30 LAB — SARS CORONAVIRUS 2 (TAT 6-24 HRS): SARS Coronavirus 2: NEGATIVE

## 2020-06-01 ENCOUNTER — Encounter (HOSPITAL_COMMUNITY)
Admission: RE | Disposition: A | Discharge status: Home / Self Care | Payer: Self-pay | Source: Home / Self Care | Attending: Internal Medicine

## 2020-06-01 ENCOUNTER — Other Ambulatory Visit: Payer: Self-pay

## 2020-06-01 ENCOUNTER — Ambulatory Visit (HOSPITAL_COMMUNITY): Payer: Medicare Other | Admitting: Anesthesiology

## 2020-06-01 ENCOUNTER — Encounter (HOSPITAL_COMMUNITY): Payer: Self-pay | Admitting: Nurse Practitioner

## 2020-06-01 ENCOUNTER — Ambulatory Visit (HOSPITAL_COMMUNITY)
Admission: RE | Admit: 2020-06-01 | Discharge: 2020-06-01 | Disposition: A | Discharge status: Home / Self Care | Payer: Medicare Other | Attending: Internal Medicine | Admitting: Internal Medicine

## 2020-06-01 DIAGNOSIS — Z8249 Family history of ischemic heart disease and other diseases of the circulatory system: Secondary | ICD-10-CM | POA: Insufficient documentation

## 2020-06-01 DIAGNOSIS — I4819 Other persistent atrial fibrillation: Secondary | ICD-10-CM

## 2020-06-01 DIAGNOSIS — Z87891 Personal history of nicotine dependence: Secondary | ICD-10-CM | POA: Diagnosis not present

## 2020-06-01 DIAGNOSIS — F4310 Post-traumatic stress disorder, unspecified: Secondary | ICD-10-CM | POA: Insufficient documentation

## 2020-06-01 DIAGNOSIS — Z880 Allergy status to penicillin: Secondary | ICD-10-CM | POA: Diagnosis not present

## 2020-06-01 DIAGNOSIS — Z79899 Other long term (current) drug therapy: Secondary | ICD-10-CM | POA: Insufficient documentation

## 2020-06-01 DIAGNOSIS — Z7901 Long term (current) use of anticoagulants: Secondary | ICD-10-CM | POA: Diagnosis not present

## 2020-06-01 DIAGNOSIS — I10 Essential (primary) hypertension: Secondary | ICD-10-CM | POA: Diagnosis not present

## 2020-06-01 DIAGNOSIS — M109 Gout, unspecified: Secondary | ICD-10-CM | POA: Diagnosis not present

## 2020-06-01 DIAGNOSIS — K219 Gastro-esophageal reflux disease without esophagitis: Secondary | ICD-10-CM | POA: Diagnosis not present

## 2020-06-01 DIAGNOSIS — F419 Anxiety disorder, unspecified: Secondary | ICD-10-CM | POA: Insufficient documentation

## 2020-06-01 DIAGNOSIS — I4891 Unspecified atrial fibrillation: Secondary | ICD-10-CM | POA: Insufficient documentation

## 2020-06-01 HISTORY — PX: CARDIOVERSION: SHX1299

## 2020-06-01 SURGERY — CARDIOVERSION
Anesthesia: General

## 2020-06-01 MED ORDER — LIDOCAINE HCL (CARDIAC) PF 50 MG/5ML IV SOSY
PREFILLED_SYRINGE | INTRAVENOUS | Status: DC | PRN
Start: 2020-06-01 — End: 2020-06-01
  Administered 2020-06-01: 40 mg via INTRAVENOUS

## 2020-06-01 MED ORDER — PROPOFOL 10 MG/ML IV BOLUS
INTRAVENOUS | Status: DC | PRN
  Administered 2020-06-01: 100 mg via INTRAVENOUS

## 2020-06-01 MED ORDER — SODIUM CHLORIDE 0.9 % IV SOLN
INTRAVENOUS | Status: DC | PRN
Start: 2020-06-01 — End: 2020-06-01

## 2020-06-01 MED ORDER — ATENOLOL 25 MG PO TABS: 25.0000 mg | ORAL_TABLET | Freq: Two times a day (BID) | ORAL | 3 refills | Status: DC

## 2020-06-01 NOTE — Interval H&P Note (Signed)
History and Physical Interval Note:  06/01/2020 8:55 AM  [name]  has presented today for surgery, with the diagnosis of AFIB.  The various methods of treatment have been discussed with the patient and family. After consideration of risks, benefits and other options for treatment, the patient has consented to  Procedure(s): CARDIOVERSION (N/A) as a surgical intervention.  The patient's history has been reviewed, patient examined, no change in status, stable for surgery.  I have reviewed the patient's chart and labs.  Questions were answered to the patient's satisfaction.    AC: no missed doses of Eliquis 5 mg BID. Echocardiogram reviewed.   Joseph Larsen

## 2020-06-01 NOTE — Anesthesia Preprocedure Evaluation (Addendum)
Anesthesia Evaluation  Patient identified by MRN, date of birth, ID band  Reviewed: Allergy & Precautions, NPO status , Patient's Chart, lab work & pertinent test results  Airway Mallampati: III  TM Distance: >3 FB Neck ROM: Full    Dental no notable dental hx. (+) Dental Advisory Given   Pulmonary former smoker,    Pulmonary exam normal breath sounds clear to auscultation       Cardiovascular hypertension, Pt. on medications and Pt. on home beta blockers  Rhythm:Irregular Rate:Normal     Neuro/Psych PSYCHIATRIC DISORDERS Anxiety Depression negative neurological ROS     GI/Hepatic Neg liver ROS, hiatal hernia, GERD  Medicated and Controlled,  Endo/Other  negative endocrine ROS  Renal/GU negative Renal ROS     Musculoskeletal Gout   Abdominal   Peds  Hematology negative hematology ROS (+)   Anesthesia Other Findings A-FIB  Reproductive/Obstetrics                            Anesthesia Physical Anesthesia Plan  ASA: III  Anesthesia Plan: General   Post-op Pain Management:    Induction: Intravenous  PONV Risk Score and Plan: 2 and Propofol infusion and Treatment may vary due to age or medical condition  Airway Management Planned: Mask  Additional Equipment:   Intra-op Plan:   Post-operative Plan:   Informed Consent: I have reviewed the patients History and Physical, chart, labs and discussed the procedure including the risks, benefits and alternatives for the proposed anesthesia with the patient or authorized representative who has indicated his/her understanding and acceptance.     Dental advisory given  Plan Discussed with: CRNA  Anesthesia Plan Comments:         Anesthesia Quick Evaluation

## 2020-06-01 NOTE — Addendum Note (Signed)
Encounter addended by: Sherran Needs, NP on: 06/01/2020 8:38 AM  Actions taken: Clinical Note Signed, Medication List reviewed, Problem List reviewed, Allergies reviewed

## 2020-06-01 NOTE — CV Procedure (Signed)
Procedure: Electrical Cardioversion Indications:  Atrial Fibrillation  Procedure Details:  Consent: Risks of procedure as well as the alternatives and risks of each were explained to the (patient/caregiver).  Consent for procedure obtained.  Time Out: Verified patient identification, verified procedure, site/side was marked, verified correct patient position, special equipment/implants available, medications/allergies/relevent history reviewed, required imaging and test results available. PERFORMED.  Patient placed on cardiac monitor, pulse oximetry, supplemental oxygen as necessary.  Sedation given: propofol per anesthesia Pacer pads placed anterior and posterior chest.  Cardioverted 1 time(s).  Cardioversion with synchronized biphasic 120J shock.  Evaluation: Findings: Post procedure EKG shows: Sinus bradycardia. Complications: None Patient did tolerate procedure well.  Time Spent Directly with the Patient:  30 minutes   Elouise Munroe 06/01/2020, 9:48 AM

## 2020-06-01 NOTE — Anesthesia Postprocedure Evaluation (Signed)
Anesthesia Post Note  Patient: RADFORD PEASE  Procedure(s) Performed: CARDIOVERSION (N/A )     Patient location during evaluation: Endoscopy Anesthesia Type: General Level of consciousness: awake Pain management: pain level controlled Vital Signs Assessment: post-procedure vital signs reviewed and stable Respiratory status: spontaneous breathing, nonlabored ventilation, respiratory function stable and patient connected to nasal cannula oxygen Cardiovascular status: blood pressure returned to baseline and stable Postop Assessment: no apparent nausea or vomiting Anesthetic complications: no   No complications documented.  Last Vitals:  Vitals:   06/01/20 0955 06/01/20 1005  BP: 123/74 118/78  Pulse: (!) 52 (!) 51  Resp: 18 15  Temp:    SpO2: 100% 100%    Last Pain:  Vitals:   06/01/20 0955  TempSrc:   PainSc: 0-No pain                 Alanii Ramer P Ejay Lashley

## 2020-06-01 NOTE — Discharge Instructions (Signed)
Electrical Cardioversion Electrical cardioversion is the delivery of a jolt of electricity to restore a normal rhythm to the heart. A rhythm that is too fast or is not regular keeps the heart from pumping well. In this procedure, sticky patches or metal paddles are placed on the chest to deliver electricity to the heart from a device. This procedure may be done in an emergency if:  There is low or no blood pressure as a result of the heart rhythm.  Normal rhythm must be restored as fast as possible to protect the brain and heart from further damage.  It may save a life. This may also be a scheduled procedure for irregular or fast heart rhythms that are not immediately life-threatening. Tell a health care provider about:  Any allergies you have.  All medicines you are taking, including vitamins, herbs, eye drops, creams, and over-the-counter medicines.  Any problems you or family members have had with anesthetic medicines.  Any blood disorders you have.  Any surgeries you have had.  Any medical conditions you have.  Whether you are pregnant or may be pregnant. What are the risks? Generally, this is a safe procedure. However, problems may occur, including:  Allergic reactions to medicines.  A blood clot that breaks free and travels to other parts of your body.  The possible return of an abnormal heart rhythm within hours or days after the procedure.  Your heart stopping (cardiac arrest). This is rare. What happens before the procedure? Medicines  Your health care provider may have you start taking: ? Blood-thinning medicines (anticoagulants) so your blood does not clot as easily. ? Medicines to help stabilize your heart rate and rhythm.  Ask your health care provider about: ? Changing or stopping your regular medicines. This is especially important if you are taking diabetes medicines or blood thinners. ? Taking medicines such as aspirin and ibuprofen. These medicines can  thin your blood. Do not take these medicines unless your health care provider tells you to take them. ? Taking over-the-counter medicines, vitamins, herbs, and supplements. General instructions  Follow instructions from your health care provider about eating or drinking restrictions.  Plan to have someone take you home from the hospital or clinic.  If you will be going home right after the procedure, plan to have someone with you for 24 hours.  Ask your health care provider what steps will be taken to help prevent infection. These may include washing your skin with a germ-killing soap. What happens during the procedure?   An IV will be inserted into one of your veins.  Sticky patches (electrodes) or metal paddles may be placed on your chest.  You will be given a medicine to help you relax (sedative).  An electrical shock will be delivered. The procedure may vary among health care providers and hospitals. What can I expect after the procedure?  Your blood pressure, heart rate, breathing rate, and blood oxygen level will be monitored until you leave the hospital or clinic.  Your heart rhythm will be watched to make sure it does not change.  You may have some redness on the skin where the shocks were given. Follow these instructions at home:  Do not drive for 24 hours if you were given a sedative during your procedure.  Take over-the-counter and prescription medicines only as told by your health care provider.  Ask your health care provider how to check your pulse. Check it often.  Rest for 48 hours after the procedure or   as told by your health care provider.  Avoid or limit your caffeine use as told by your health care provider.  Keep all follow-up visits as told by your health care provider. This is important. Contact a health care provider if:  You feel like your heart is beating too quickly or your pulse is not regular.  You have a serious muscle cramp that does not go  away. Get help right away if:  You have discomfort in your chest.  You are dizzy or you feel faint.  You have trouble breathing or you are short of breath.  Your speech is slurred.  You have trouble moving an arm or leg on one side of your body.  Your fingers or toes turn cold or blue. Summary  Electrical cardioversion is the delivery of a jolt of electricity to restore a normal rhythm to the heart.  This procedure may be done right away in an emergency or may be a scheduled procedure if the condition is not an emergency.  Generally, this is a safe procedure.  After the procedure, check your pulse often as told by your health care provider. This information is not intended to replace advice given to you by your health care provider. Make sure you discuss any questions you have with your health care provider. Document Revised: 07/01/2019 Document Reviewed: 07/01/2019 Elsevier Patient Education  2020 [employer]  

## 2020-06-01 NOTE — Transfer of Care (Signed)
Immediate Anesthesia Transfer of Care Note  Patient: Joseph Larsen  Procedure(s) Performed: CARDIOVERSION (N/A )  Patient Location: Endoscopy Unit  Anesthesia Type:General  Level of Consciousness: awake, alert  and sedated  Airway & Oxygen Therapy: Patient connected to face mask oxygen  Post-op Assessment: Post -op Vital signs reviewed and stable  Post vital signs: stable  Last Vitals:  Vitals Value Taken Time  BP    Temp    Pulse    Resp    SpO2      Last Pain:  Vitals:   06/01/20 0828  TempSrc: Oral  PainSc: 0-No pain         Complications: No complications documented.

## 2020-06-16 ENCOUNTER — Other Ambulatory Visit: Payer: Self-pay

## 2020-06-16 ENCOUNTER — Encounter (HOSPITAL_COMMUNITY): Payer: Medicare Other

## 2020-06-16 ENCOUNTER — Ambulatory Visit (HOSPITAL_COMMUNITY)
Admission: RE | Admit: 2020-06-16 | Discharge: 2020-06-16 | Disposition: A | Discharge status: Home / Self Care | Payer: Medicare Other | Source: Ambulatory Visit | Attending: Nurse Practitioner | Admitting: Nurse Practitioner

## 2020-06-16 ENCOUNTER — Encounter (HOSPITAL_COMMUNITY): Payer: Self-pay | Admitting: Nurse Practitioner

## 2020-06-16 VITALS — BP 146/86 | HR 62 | Ht 71.0 in | Wt 179.4 lb

## 2020-06-16 DIAGNOSIS — Z79899 Other long term (current) drug therapy: Secondary | ICD-10-CM | POA: Diagnosis not present

## 2020-06-16 DIAGNOSIS — J309 Allergic rhinitis, unspecified: Secondary | ICD-10-CM | POA: Diagnosis not present

## 2020-06-16 DIAGNOSIS — Z8719 Personal history of other diseases of the digestive system: Secondary | ICD-10-CM | POA: Insufficient documentation

## 2020-06-16 DIAGNOSIS — Z888 Allergy status to other drugs, medicaments and biological substances status: Secondary | ICD-10-CM | POA: Insufficient documentation

## 2020-06-16 DIAGNOSIS — D539 Nutritional anemia, unspecified: Secondary | ICD-10-CM | POA: Insufficient documentation

## 2020-06-16 DIAGNOSIS — F419 Anxiety disorder, unspecified: Secondary | ICD-10-CM | POA: Diagnosis not present

## 2020-06-16 DIAGNOSIS — I4819 Other persistent atrial fibrillation: Secondary | ICD-10-CM

## 2020-06-16 DIAGNOSIS — I480 Paroxysmal atrial fibrillation: Secondary | ICD-10-CM | POA: Diagnosis not present

## 2020-06-16 DIAGNOSIS — D6869 Other thrombophilia: Secondary | ICD-10-CM | POA: Diagnosis not present

## 2020-06-16 DIAGNOSIS — Z8249 Family history of ischemic heart disease and other diseases of the circulatory system: Secondary | ICD-10-CM | POA: Insufficient documentation

## 2020-06-16 DIAGNOSIS — K219 Gastro-esophageal reflux disease without esophagitis: Secondary | ICD-10-CM | POA: Diagnosis not present

## 2020-06-16 DIAGNOSIS — I10 Essential (primary) hypertension: Secondary | ICD-10-CM | POA: Insufficient documentation

## 2020-06-16 DIAGNOSIS — F4310 Post-traumatic stress disorder, unspecified: Secondary | ICD-10-CM | POA: Diagnosis not present

## 2020-06-16 DIAGNOSIS — Z7901 Long term (current) use of anticoagulants: Secondary | ICD-10-CM | POA: Insufficient documentation

## 2020-06-16 DIAGNOSIS — Z87891 Personal history of nicotine dependence: Secondary | ICD-10-CM | POA: Diagnosis not present

## 2020-06-16 DIAGNOSIS — Z880 Allergy status to penicillin: Secondary | ICD-10-CM | POA: Diagnosis not present

## 2020-06-16 DIAGNOSIS — F329 Major depressive disorder, single episode, unspecified: Secondary | ICD-10-CM | POA: Insufficient documentation

## 2020-06-16 MED ORDER — ATENOLOL 25 MG PO TABS: 12.5000 mg | ORAL_TABLET | Freq: Every day | ORAL | 3 refills | Status: DC

## 2020-06-16 NOTE — Progress Notes (Signed)
Primary Care Physician: [name], DO Referring Physician: Dr. Vella Kohler is a 74 y.o. male with a h/o paroxysmal afib on eliquis 5 mg bid for a CHA2DS2VASc score of 2. I saw pt last in 2019 when his atenolol was adjusted for afib and he has been in rhythm until recently.  He was seen by Dr. Johnsie Cancel for a check-up recently and mentioned that he did feel as well for a couple of months. A monitor was placed and now he is in the afib clinic to be set up for cardioversion. No missed eliquis for at least 3 weeks. No  known trigger. He has had both covid shots.  F/u in afib clinic, 6/15. He had a successful cardioversion but had ERAF on f/u with Kathyrn Drown, NP. She discussed with Dr. Verlin Grills recommended start of flecainide 50 mg bid and he is also scheduled for a stress test on Friday. He is tolerating the 50 mg flecainide well. I plan  to increase to 100 mg bid and schedule for a cardioversion on Monday. No missed anticoagulation. He has had both covid shots.  F/u in afib clinic, 06/16/20. He did convert to SR and staying  in SR on flecainide 100 mg bid. He was told to hold his BB at time of cardioversion for HR of low 50's. However, on flecainide, he needs to be on a BB/CCB  to oppose flecainide. He is feeling improved. He did have a stress test showing no ischemia but possible prior MI.  Discussion with Dr. Johnsie Cancel  prior to cardioversion with recent loading of flecainide and possible fixed defect on stress test, otherwise, low risk scan.  He wanted pt to have a f/u see if a   cardiac CT  was needed  to further evaluate fixed defect  on stress test but no ischemia, otherwise low risk test.  Today, he denies symptoms of palpitations, chest pain, shortness of breath, orthopnea, PND, lower extremity edema, dizziness, presyncope, syncope, or neurologic sequela. The patient is tolerating medications without difficulties and is otherwise without complaint today.   Past Medical History:   Diagnosis Date  . Abnormal ECG    a. 08/2012 Abnl ETT with ST changes in recovery;  b. 08/2012 Ex MV, ex time 10:00, EF 66%, no ischemia/infarct.  . Allergic rhinitis   . Anxiety   . Cataract   . Depression   . Diverticular disease   . Dyspnea on exertion   . ETOH abuse   . GERD (gastroesophageal reflux disease)   . Gout   . Hemorrhoids   . Hiatal hernia   . HTN (hypertension)   . Low testosterone   . MACROCYTIC ANEMIA   . NONSPEC ELEVATION OF LEVELS OF TRANSAMINASE/LDH   . Nonspecific abnormal finding in stool contents   . Palpitations   . PTSD (post-traumatic stress disorder)   . PTSD (post-traumatic stress disorder)    [name]   Past Surgical History:  Procedure Laterality Date  . CARDIOVERSION N/A 05/15/2020   Procedure: CARDIOVERSION;  Surgeon: Sueanne Margarita, MD;  Location: [facility] ENDOSCOPY;  Service: Cardiovascular;  Laterality: N/A;  . CARDIOVERSION N/A 06/01/2020   Procedure: CARDIOVERSION;  Surgeon: Elouise Munroe, MD;  Location: [facility] ENDOSCOPY;  Service: Cardiovascular;  Laterality: N/A;  . CHOLECYSTECTOMY  2004  . KNEE SURGERY Bilateral   . SHOULDER ARTHROSCOPY Right 01/14/2020    Current Outpatient Medications  Medication Sig Dispense Refill  . allopurinol (ZYLOPRIM) 300 MG  tablet Take 150 mg by mouth daily.    Marland Kitchen ALPRAZolam (XANAX) 0.5 MG tablet Take 0.25 mg by mouth at bedtime.     Marland Kitchen apixaban (ELIQUIS) 5 MG TABS tablet Take 1 tablet (5 mg total) by mouth 2 (two) times daily. 60 tablet 0  . cholecalciferol (VITAMIN D) 1000 UNITS tablet Take 2,000 Units by mouth daily.     . Cyanocobalamin 1000 MCG/ML KIT Inject 1,000 mcg as directed every 30 (thirty) days.    . flecainide (TAMBOCOR) 50 MG tablet Take 2 tablets (100 mg total) by mouth 2 (two) times daily. 60 tablet 6  . hydrochlorothiazide (HYDRODIURIL) 25 MG tablet Take 25 mg by mouth daily.     Marland Kitchen losartan (COZAAR) 100 MG tablet Take 50 mg by mouth daily.    . Misc Natural Products (OSTEO  BI-FLEX/5-LOXIN ADVANCED PO) Take 1 tablet by mouth daily.     . Omega-3 Fatty Acids (FISH OIL) 1000 MG CAPS Take 2,000 mg by mouth in the morning and at bedtime.     Marland Kitchen omeprazole (PRILOSEC) 20 MG capsule Take 40 mg by mouth as needed.     . potassium chloride SA (KLOR-CON) 20 MEQ tablet Take 1 tablet (20 mEq total) by mouth 2 (two) times daily. 60 tablet 3  . QUEtiapine (SEROQUEL) 100 MG tablet Take 25 mg by mouth at bedtime. Pt only takes 1/4 tab     . atenolol (TENORMIN) 25 MG tablet Take 0.5 tablets (12.5 mg total) by mouth daily. 60 tablet 3   Current Facility-Administered Medications  Medication Dose Route Frequency Provider Last Rate Last Admin  . cyanocobalamin ((VITAMIN B-12)) injection 1,000 mcg  1,000 mcg Intramuscular Q30 days Timmothy Euler, MD   1,000 mcg at 02/13/18 0932    Allergies  Allergen Reactions  . Terazosin Rash    Rash when in sun  . Enalapril Maleate Rash    REACTION: rash  . Lisinopril Rash and Itching  . Penicillins Itching and Rash    REACTION: rash, itching 35 years ago     Social History   Socioeconomic History  . Marital status: Married    Spouse name: Suanne Marker  . Number of children: 1  . Years of education: Not on file  . Highest education level: Not on file  Occupational History  . Occupation: disabled    Employer: RETIRED    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  Tobacco Use  . Smoking status: Former Smoker    Packs/day: 3.00    Types: Cigarettes    Start date: 02/09/1965    Quit date: 05/12/1982    Years since quitting: 38.1  . Smokeless tobacco: Never Used  Vaping Use  . Vaping Use: Never used  Substance and Sexual Activity  . Alcohol use: Yes    Alcohol/week: 14.0 standard drinks    Types: 14 Cans of beer per week    Comment: drinks 2-3 cans of beer/day    . Drug use: No    Comment: 1 cup of decaf coffee/day.  Marland Kitchen Sexual activity: Yes  Other Topics Concern  . Not on file  Social History Narrative   Lives with wife  in [place] in a one story home.  Exercises @ gym 3x/wk - stationary bike and light weight training.   Social Determinants of Health   Financial Resource Strain:   . Difficulty of Paying Living Expenses:   Food Insecurity:   . Worried About Charity fundraiser in the Last Year:   .  Ran Out of Food in the Last Year:   Transportation Needs:   . Freight forwarder (Medical):   Marland Kitchen Lack of Transportation (Non-Medical):   Physical Activity:   . Days of Exercise per Week:   . Minutes of Exercise per Session:   Stress:   . Feeling of Stress :   Social Connections:   . Frequency of Communication with Friends and Family:   . Frequency of Social Gatherings with Friends and Family:   . Attends Religious Services:   . Active Member of Clubs or Organizations:   . Attends Banker Meetings:   Marland Kitchen Marital Status:   Intimate Partner Violence:   . Fear of Current or Ex-Partner:   . Emotionally Abused:   Marland Kitchen Physically Abused:   . Sexually Abused:     Family History  Problem Relation Age of Onset  . Heart disease Mother        "died of chf"  . Heart disease Father        "died of old age"  . Hypertension Sister   . Liver disease Brother   . Hypertension Brother   . Hypertension Sister   . Hypertension Sister   . Hypertension Sister   . Heart murmur Sister   . Colon cancer Neg Hx     ROS- All systems are reviewed and negative except as per the HPI above  Physical Exam: Vitals:   06/16/20 0921  BP: (!) 146/86  Pulse: 62  Weight: 81.4 kg  Height: 5\' 11"  (1.803 m)   Wt Readings from Last 3 Encounters:  06/16/20 81.4 kg  05/29/20 82.6 kg  05/26/20 82.5 kg    Labs: Lab Results  Component Value Date   NA 142 05/26/2020   K 3.4 (L) 05/26/2020   CL 105 05/26/2020   CO2 28 05/26/2020   GLUCOSE 93 05/26/2020   BUN 11 05/26/2020   CREATININE 0.90 05/26/2020   CALCIUM 9.2 05/26/2020   MG 1.7 05/18/2020   Lab Results  Component Value Date   INR 1.1 ratio (H)  08/09/2010   Lab Results  Component Value Date   CHOL 98 (L) 04/13/2020   HDL 28 (L) 04/13/2020   LDLCALC 55 04/13/2020   TRIG 68 04/13/2020     GEN- The patient is well appearing, alert and oriented x 3 today.   Head- normocephalic, atraumatic Eyes-  Sclera clear, conjunctiva pink Ears- hearing intact Oropharynx- clear Neck- supple, no JVP Lymph- no cervical lymphadenopathy Lungs- Clear to ausculation bilaterally, normal work of breathing Heart-  regular rate and rhythm, no murmurs, rubs or gallops, PMI not laterally displaced GI- soft, NT, ND, + BS Extremities- no clubbing, cyanosis, or edema MS- no significant deformity or atrophy Skin- no rash or lesion Psych- euthymic mood, full affect Neuro- strength and sensation are intact  EKG- sinus rhythm at 62 bpm, pr int 288 ms, qrs int 102 ms, qtc 448 ms ( pr int on cardioversion 6/4 without flecainide was 260 ms) Zio patch reviewed which showed afib 100% of the time.   Myoview- Exercise/Lexiscan protocol utilized due to failure to achieve adequate heart rate response. No obvious diagnostic ST segment changes noted in the setting of lead motion artifact.  Blood pressure demonstrated a hypertensive response to exercise.  Small, mild intensity, fixed apical anteroseptal defect that is most consistent with soft tissue attenuation.  This is a low risk study.  Nuclear stress EF: 52%.      Assessment and Plan: 1. Persistent  afib  Had been in afib possibly for the last 2-3 months Successful cardioversion 6/4  but ERAF Loaded on 100 mg  flecainide bid with successful cardioversion 6/21 Go back to at least atenolol 12.5 mg daily, stopped at time of CV 2/2 for  a HR of 52 bpm,  pt states has tolerated a HR in the low to mid fifties in the past   2. CHA2DS2VASc score of 2 Continue eliquis 5 mg bid    3.Recent stress test showing fixed apical defect, no ischemia, low risk No anginal symptoms  Has f/u with Richardson Dopp to see  if any further cardiac testing is needed,  7/21, since pt is on flecainide    F/u with Dr. Johnsie Cancel  9/8 afib clinic as needed   Butch Penny C. Marjarie Irion, [facility] [address] [place], [place] [address] [phone]

## 2020-06-16 NOTE — Patient Instructions (Signed)
Start atenolol 12.5mg  once a day (1/2 of your 25mg  tablet)

## 2020-06-18 ENCOUNTER — Encounter: Payer: Self-pay | Admitting: Family Medicine

## 2020-06-18 ENCOUNTER — Ambulatory Visit (HOSPITAL_COMMUNITY): Payer: Medicare Other | Admitting: Nurse Practitioner

## 2020-06-18 ENCOUNTER — Ambulatory Visit (INDEPENDENT_AMBULATORY_CARE_PROVIDER_SITE_OTHER): Payer: Medicare Other | Admitting: Family Medicine

## 2020-06-18 ENCOUNTER — Other Ambulatory Visit: Payer: Self-pay

## 2020-06-18 VITALS — BP 138/76 | HR 51 | Temp 97.2°F | Ht 71.0 in | Wt 180.0 lb

## 2020-06-18 DIAGNOSIS — R1084 Generalized abdominal pain: Secondary | ICD-10-CM | POA: Diagnosis not present

## 2020-06-18 NOTE — Patient Instructions (Signed)

## 2020-06-18 NOTE — Progress Notes (Signed)
Assessment & Plan:  1. Generalized abdominal pain - Offered CT scan but patient is choosing to wait and see how his pain does. No concerning findings on exam today. Education provided on abdominal pain. Advised if he decides he wants the CT scan to let me know and I will order it.   Follow up plan: Return if symptoms worsen or fail to improve.  Hendricks Limes, MSN, APRN, FNP-C [facility]  Subjective:   Patient ID: Joseph Larsen, male    DOB: 03-29-1946, 74 y.o.   MRN: [id number]  HPI: Joseph Larsen is a 74 y.o. male presenting on 06/18/2020 for Abdominal Pain (RLQ pain x a few months on and off.  Patient states it has been worse the past few days.)  Patient reports right lower quadrant abdominal pain that has been on and off for the past few months. States it has been worse the past few days but he actually has no pain today. He notices that the longer he stands the more it hurts. Pain is not related to meals. He has regular bowel movements. Denies feeling any bulging in that area, nausea, vomiting, diarrhea, or fever.   ROS: Negative unless specifically indicated above in HPI.   Relevant past medical history reviewed and updated as indicated.   Allergies and medications reviewed and updated.   Current Outpatient Medications:    allopurinol (ZYLOPRIM) 300 MG tablet, Take 150 mg by mouth daily., Disp: , Rfl:    ALPRAZolam (XANAX) 0.5 MG tablet, Take 0.25 mg by mouth at bedtime. , Disp: , Rfl:    apixaban (ELIQUIS) 5 MG TABS tablet, Take 1 tablet (5 mg total) by mouth 2 (two) times daily., Disp: 60 tablet, Rfl: 0   atenolol (TENORMIN) 25 MG tablet, Take 0.5 tablets (12.5 mg total) by mouth daily., Disp: 60 tablet, Rfl: 3   cholecalciferol (VITAMIN D) 1000 UNITS tablet, Take 2,000 Units by mouth daily. , Disp: , Rfl:    Cyanocobalamin 1000 MCG/ML KIT, Inject 1,000 mcg as directed every 30 (thirty) days., Disp: , Rfl:    flecainide (TAMBOCOR) 50 MG tablet,  Take 2 tablets (100 mg total) by mouth 2 (two) times daily., Disp: 60 tablet, Rfl: 6   hydrochlorothiazide (HYDRODIURIL) 25 MG tablet, Take 25 mg by mouth daily. , Disp: , Rfl:    losartan (COZAAR) 100 MG tablet, Take 50 mg by mouth daily., Disp: , Rfl:    Misc Natural Products (OSTEO BI-FLEX/5-LOXIN ADVANCED PO), Take 1 tablet by mouth daily. , Disp: , Rfl:    Omega-3 Fatty Acids (FISH OIL) 1000 MG CAPS, Take 2,000 mg by mouth in the morning and at bedtime. , Disp: , Rfl:    omeprazole (PRILOSEC) 20 MG capsule, Take 40 mg by mouth as needed. , Disp: , Rfl:    potassium chloride SA (KLOR-CON) 20 MEQ tablet, Take 1 tablet (20 mEq total) by mouth 2 (two) times daily., Disp: 60 tablet, Rfl: 3   QUEtiapine (SEROQUEL) 100 MG tablet, Take 25 mg by mouth at bedtime. Pt only takes 1/4 tab , Disp: , Rfl:   Current Facility-Administered Medications:    cyanocobalamin ((VITAMIN B-12)) injection 1,000 mcg, 1,000 mcg, Intramuscular, Q30 days, Timmothy Euler, MD, 1,000 mcg at 02/13/18 0932  Allergies  Allergen Reactions   Terazosin Rash    Rash when in sun   Enalapril Maleate Rash    REACTION: rash   Lisinopril Rash and Itching   Penicillins Itching and Rash  REACTION: rash, itching 35 years ago     Objective:   BP 138/76    Pulse (!) 51    Temp (!) 97.2 F (36.2 C) (Temporal)    Ht 5' 11"  (1.803 m)    Wt 180 lb (81.6 kg)    SpO2 97%    BMI 25.10 kg/m    Physical Exam Vitals reviewed.  Constitutional:      General: He is not in acute distress.    Appearance: Normal appearance. He is not ill-appearing, toxic-appearing or diaphoretic.  HENT:     Head: Normocephalic and atraumatic.  Eyes:     General: No scleral icterus.       Right eye: No discharge.        Left eye: No discharge.     Conjunctiva/sclera: Conjunctivae normal.  Cardiovascular:     Rate and Rhythm: Normal rate.  Pulmonary:     Effort: Pulmonary effort is normal. No respiratory distress.  Abdominal:      General: Abdomen is flat. Bowel sounds are normal. There is no distension or abdominal bruit. There are no signs of injury.     Palpations: Abdomen is soft. There is no shifting dullness, fluid wave, hepatomegaly, splenomegaly, mass or pulsatile mass.     Tenderness: There is generalized abdominal tenderness. There is no guarding or rebound. Negative signs include Murphy's sign, Rovsing's sign, McBurney's sign, psoas sign and obturator sign.     Hernia: No hernia is present.  Musculoskeletal:        General: Normal range of motion.     Cervical back: Normal range of motion.  Skin:    General: Skin is warm and dry.  Neurological:     Mental Status: He is alert and oriented to person, place, and time. Mental status is at baseline.  Psychiatric:        Mood and Affect: Mood normal.        Behavior: Behavior normal.        Thought Content: Thought content normal.        Judgment: Judgment normal.

## 2020-06-22 DIAGNOSIS — Z85828 Personal history of other malignant neoplasm of skin: Secondary | ICD-10-CM | POA: Diagnosis not present

## 2020-06-22 DIAGNOSIS — L814 Other melanin hyperpigmentation: Secondary | ICD-10-CM | POA: Diagnosis not present

## 2020-06-22 DIAGNOSIS — L821 Other seborrheic keratosis: Secondary | ICD-10-CM | POA: Diagnosis not present

## 2020-06-22 DIAGNOSIS — D1801 Hemangioma of skin and subcutaneous tissue: Secondary | ICD-10-CM | POA: Diagnosis not present

## 2020-06-22 DIAGNOSIS — D2261 Melanocytic nevi of right upper limb, including shoulder: Secondary | ICD-10-CM | POA: Diagnosis not present

## 2020-06-22 DIAGNOSIS — L570 Actinic keratosis: Secondary | ICD-10-CM | POA: Diagnosis not present

## 2020-06-22 DIAGNOSIS — D225 Melanocytic nevi of trunk: Secondary | ICD-10-CM | POA: Diagnosis not present

## 2020-06-22 DIAGNOSIS — D2372 Other benign neoplasm of skin of left lower limb, including hip: Secondary | ICD-10-CM | POA: Diagnosis not present

## 2020-07-01 ENCOUNTER — Ambulatory Visit (INDEPENDENT_AMBULATORY_CARE_PROVIDER_SITE_OTHER): Payer: Medicare Other | Admitting: Physician Assistant

## 2020-07-01 ENCOUNTER — Other Ambulatory Visit: Payer: Self-pay

## 2020-07-01 ENCOUNTER — Encounter: Payer: Self-pay | Admitting: Physician Assistant

## 2020-07-01 ENCOUNTER — Encounter: Payer: Self-pay | Admitting: Gastroenterology

## 2020-07-01 VITALS — BP 130/78 | HR 57 | Ht 71.0 in | Wt 177.0 lb

## 2020-07-01 DIAGNOSIS — E876 Hypokalemia: Secondary | ICD-10-CM

## 2020-07-01 DIAGNOSIS — R9430 Abnormal result of cardiovascular function study, unspecified: Secondary | ICD-10-CM

## 2020-07-01 DIAGNOSIS — I10 Essential (primary) hypertension: Secondary | ICD-10-CM

## 2020-07-01 DIAGNOSIS — R931 Abnormal findings on diagnostic imaging of heart and coronary circulation: Secondary | ICD-10-CM

## 2020-07-01 DIAGNOSIS — R9439 Abnormal result of other cardiovascular function study: Secondary | ICD-10-CM | POA: Diagnosis not present

## 2020-07-01 DIAGNOSIS — I4819 Other persistent atrial fibrillation: Secondary | ICD-10-CM

## 2020-07-01 LAB — BASIC METABOLIC PANEL
BUN/Creatinine Ratio: 11 (ref 10–24)
BUN: 9 mg/dL (ref 8–27)
CO2: 29 mmol/L (ref 20–29)
Calcium: 9.6 mg/dL (ref 8.6–10.2)
Chloride: 100 mmol/L (ref 96–106)
Creatinine, Ser: 0.85 mg/dL (ref 0.76–1.27)
GFR calc Af Amer: 100 mL/min/{1.73_m2} (ref >59)
GFR calc non Af Amer: 86 mL/min/{1.73_m2} (ref >59)
Glucose: 88 mg/dL (ref 65–99)
Potassium: 3.9 mmol/L (ref 3.5–5.2)
Sodium: 136 mmol/L (ref 134–144)

## 2020-07-01 NOTE — Patient Instructions (Addendum)
Medication Instructions:  Your physician recommends that you continue on your current medications as directed. Please refer to the Current Medication list given to you today.  *If you need a refill on your cardiac medications before your next appointment, please call your pharmacy*  Lab Work: You will have labs drawn today: BMET  If you have labs (blood work) drawn today and your tests are completely normal, you will receive your results only by: Marland Kitchen MyChart Message (if you have MyChart) OR . A paper copy in the mail If you have any lab test that is abnormal or we need to change your treatment, we will call you to review the results.  Testing/Procedures: Your physician has requested that you have cardiac CT. Cardiac computed tomography (CT) is a painless test that uses an x-ray machine to take clear, detailed pictures of your heart. For further information please visit [email or web address]. Please follow instruction sheet as given.  Your cardiac CT will be scheduled at one of the below locations:   [facility] [address] [place], [place] [address] [phone]  [facility] [address] [place], [place] [address] [phone]  If scheduled at [facility], please arrive at the [facility] main entrance of [facility] 30 minutes prior to test start time. Proceed to the [facility] Radiology Department (first floor) to check-in and test prep.  If scheduled at [facility], please arrive 15 mins early for check-in and test prep.  Please follow these instructions carefully (unless otherwise directed):  On the Night Before the Test: . Be sure to Drink plenty of water. . Do not consume any caffeinated/decaffeinated beverages or chocolate 12 hours prior to your test. . Do not take any antihistamines 12 hours prior to your test.  On the Day of the Test: . Drink plenty of water.  Do not drink any water within one hour of the test. . Do not eat any food 4 hours prior to the test. . You may take your regular medications prior to the test.  . HOLD Hydrochlorothiazide morning of the test.      After the Test: . Drink plenty of water. . After receiving IV contrast, you may experience a mild flushed feeling. This is normal. . On occasion, you may experience a mild rash up to 24 hours after the test. This is not dangerous. If this occurs, you can take Benadryl 25 mg and increase your fluid intake. . If you experience trouble breathing, this can be serious. If it is severe call 911 IMMEDIATELY. If it is mild, please call our office. . If you take any of these medications: Glipizide/Metformin, Avandament, Glucavance, please do not take 48 hours after completing test unless otherwise instructed.   Once we have confirmed authorization from your insurance company, we will call you to set up a date and time for your test. Based on how quickly your insurance processes prior authorizations requests, please allow up to 4 weeks to be contacted for scheduling your Cardiac CT appointment. Be advised that routine Cardiac CT appointments could be scheduled as many as 8 weeks after your provider has ordered it.  For non-scheduling related questions, please contact the cardiac imaging nurse navigator should you have any questions/concerns: Marchia Bond, Cardiac Imaging Nurse Navigator Burley Saver, Interim Cardiac Imaging Nurse [facility] Direct Office Dial: [phone]   For scheduling needs, including cancellations and rescheduling, please call  Toni at [phone], option 3.   Follow-Up:  Keep follow up with Dr. Johnsie Cancel on 08/19/20

## 2020-07-01 NOTE — Progress Notes (Signed)
Cardiology Office Note:    Date:  07/01/2020   ID:  ADDEN STROUT, DOB November 16, 1946, MRN [id number]  PCP:  [name], DO  Cardiologist:  Jenkins Rouge, MD   Electrophysiologist:  None   Referring MD: [name], DO   Chief Complaint:  Follow-up (AFib)    Patient Profile:    Joseph Larsen is a 74 y.o. male with:   Paroxysmal >> persistent atrial fibrillation  S/p DCCV 05/15/20 >> ERAF  Flecainide started 6/21  ETT-Myoview - no ischemia; no VT  DCCV 06/01/20 >> NSR  Hypertension  Hyperlipidemia  Alcohol abuse  Chest pain  Myoview 6/19: No ischemia  Myoview 6/21: Small inferoapical infarct, no ischemia, low risk  Echo 6/19: EF 60-65  Echo 5/21: EF 55-60, LAE, LVH   Prior CV studies: ETT-Myoview 05/29/2020 Small inferior apical wall infarct, no ischemia, not gated due to atrial fibrillation, low risk ETT portion normal  Event monitor 05/04/2020 Atrial fibrillation, average heart rate 81, 3.1-second pause, <1% PVCs  Echocardiogram 04/23/2020 EF 55-60, no RWMA, mild LVH, normal RV SF, severe LAE, mildly dilated aortic root (39 mm), dilated ascending aorta (40 mm)    History of Present Illness:    Joseph Larsen was noted to be in persistent atrial fibrillation in May 2021.  He was set up for cardioversion but had early recurrence of atrial fibrillation.  He was placed on flecainide and also followed in the [facility].  He underwent repeat cardioversion 06/01/2020 which was successful.  When last seen in the [facility] 06/16/2020, he was maintaining sinus rhythm.  Of note, after starting on flecainide, his exercise test did not demonstrate ventricular tachycardia.  His nuclear images did demonstrate inferoapical infarct but no ischemia.  I have reviewed the notes from Roderic Palau, NP in the [facility] which indicate that Dr. Johnsie Cancel had recommended considering coronary CTA if he maintained sinus rhythm on flecainide given the fixed  defect on his nuclear images.  If he had return of atrial fibrillation, flecainide will be discontinued.  He returns for follow-up.  He is here alone.  He feels much better in normal sinus rhythm.  He has not had any chest pain.  His breathing is improved.  He has not had syncope, orthopnea, leg swelling.    Past Medical History:  Diagnosis Date  . Abnormal ECG    a. 08/2012 Abnl ETT with ST changes in recovery;  b. 08/2012 Ex MV, ex time 10:00, EF 66%, no ischemia/infarct.  . Allergic rhinitis   . Anxiety   . Cataract   . Depression   . Diverticular disease   . Dyspnea on exertion   . ETOH abuse   . GERD (gastroesophageal reflux disease)   . Gout   . Hemorrhoids   . Hiatal hernia   . HTN (hypertension)   . Low testosterone   . MACROCYTIC ANEMIA   . NONSPEC ELEVATION OF LEVELS OF TRANSAMINASE/LDH   . Nonspecific abnormal finding in stool contents   . Palpitations   . PTSD (post-traumatic stress disorder)   . PTSD (post-traumatic stress disorder)    [name]    Current Medications: Current Meds  Medication Sig  . allopurinol (ZYLOPRIM) 300 MG tablet Take 150 mg by mouth daily.  Marland Kitchen ALPRAZolam (XANAX) 0.5 MG tablet Take 0.25 mg by mouth at bedtime.   Marland Kitchen apixaban (ELIQUIS) 5 MG TABS tablet Take 1 tablet (5 mg total) by mouth 2 (two) times daily.  Marland Kitchen  atenolol (TENORMIN) 25 MG tablet Take 0.5 tablets (12.5 mg total) by mouth daily.  . cholecalciferol (VITAMIN D) 1000 UNITS tablet Take 2,000 Units by mouth daily.   . Cyanocobalamin 1000 MCG/ML KIT Inject 1,000 mcg as directed every 30 (thirty) days.  . flecainide (TAMBOCOR) 50 MG tablet Take 2 tablets (100 mg total) by mouth 2 (two) times daily.  . hydrochlorothiazide (HYDRODIURIL) 25 MG tablet Take 25 mg by mouth daily.   Marland Kitchen losartan (COZAAR) 100 MG tablet Take 50 mg by mouth daily.  . Misc Natural Products (OSTEO BI-FLEX/5-LOXIN ADVANCED PO) Take 1 tablet by mouth daily.   . Omega-3 Fatty Acids (FISH OIL) 1000 MG CAPS Take  2,000 mg by mouth in the morning and at bedtime.   Marland Kitchen omeprazole (PRILOSEC) 20 MG capsule Take 40 mg by mouth as needed.   . potassium chloride SA (KLOR-CON) 20 MEQ tablet Take 1 tablet (20 mEq total) by mouth 2 (two) times daily.  . QUEtiapine (SEROQUEL) 100 MG tablet Take 25 mg by mouth at bedtime. Pt only takes 1/4 tab    Current Facility-Administered Medications for the 07/01/20 encounter (Office Visit) with Richardson Dopp T, PA-C  Medication  . cyanocobalamin ((VITAMIN B-12)) injection 1,000 mcg     Allergies:   Terazosin, Enalapril maleate, Lisinopril, and Penicillins   Social History   Tobacco Use  . Smoking status: Former Smoker    Packs/day: 3.00    Types: Cigarettes    Start date: 02/09/1965    Quit date: 05/12/1982    Years since quitting: 38.1  . Smokeless tobacco: Never Used  Vaping Use  . Vaping Use: Never used  Substance Use Topics  . Alcohol use: Yes    Alcohol/week: 14.0 standard drinks    Types: 14 Cans of beer per week    Comment: drinks 2-3 cans of beer/day    . Drug use: No    Comment: 1 cup of decaf coffee/day.     Family Hx: The patient's family history includes Heart disease in his father and mother; Heart murmur in his sister; Hypertension in his brother, sister, sister, sister, and sister; Liver disease in his brother. There is no history of Colon cancer.  Review of Systems  Gastrointestinal: Negative for hematochezia and melena.  Genitourinary: Negative for hematuria.     EKGs/Labs/Other Test Reviewed:    EKG:  EKG is not  ordered today.  The ekg ordered today demonstrates n/a  Recent Labs: 04/13/2020: ALT 31; TSH 2.560 05/18/2020: Magnesium 1.7 05/26/2020: BUN 11; Creatinine, Ser 0.90; Hemoglobin 17.9; Platelets 137; Potassium 3.4; Sodium 142   Recent Lipid Panel Lab Results  Component Value Date/Time   CHOL 98 (L) 04/13/2020 08:39 AM   TRIG 68 04/13/2020 08:39 AM   TRIG 82 03/25/2015 09:50 AM   HDL 28 (L) 04/13/2020 08:39 AM   HDL 38 (L)  03/25/2015 09:50 AM   CHOLHDL 3.5 04/13/2020 08:39 AM   LDLCALC 55 04/13/2020 08:39 AM   LDLCALC 54 07/17/2014 08:28 AM   LDLDIRECT 74 01/03/2017 11:00 AM    Physical Exam:    VS:  BP 130/78   Pulse (!) 57   Ht 5' 11"  (1.803 m)   Wt 177 lb (80.3 kg)   SpO2 98%   BMI 24.69 kg/m     Wt Readings from Last 3 Encounters:  07/01/20 177 lb (80.3 kg)  06/18/20 180 lb (81.6 kg)  06/16/20 179 lb 6.4 oz (81.4 kg)     Constitutional:  Appearance: Healthy appearance. Not in distress.  Neck:     Thyroid: No thyromegaly.     Vascular: JVD normal.  Pulmonary:     Effort: Pulmonary effort is normal.     Breath sounds: No wheezing. No rales.  Cardiovascular:     Normal rate. Regular rhythm. Normal S1. Normal S2.     Murmurs: There is no murmur.  Edema:    Peripheral edema absent.  Abdominal:     Palpations: Abdomen is soft. There is no hepatomegaly.  Skin:    General: Skin is warm and dry.  Neurological:     General: No focal deficit present.     Mental Status: Alert and oriented to person, place and time.     Cranial Nerves: Cranial nerves are intact.       ASSESSMENT & PLAN:    1. Persistent atrial fibrillation (HCC) Maintaining normal sinus rhythm.  He feels much better in normal sinus rhythm.  He is tolerating anticoagulation and Flecainide.  Recent Hgb, Creatinine normal.  Continue current dose of Apixaban, Flecainide.   2. Essential hypertension The patient's blood pressure is controlled on his current regimen.  Continue current therapy.    3. Abnormal nuclear stress test His Myoview demonstrated a prior inferoapical infarct.  He is a prior smoker with a FHx of CAD.  He clearly benefits from being in normal sinus rhythm and is currently on Flecainide.  If he has significant coronary artery disease, we would need to consider an alternate anti-arrhythmic drug.  We discussed the rationale and benefits of proceeding with a coronary CTA to rule out significant coronary  artery disease.   -Schedule Coronary CTA w/ FFR  4. Hypokalemia Recent K+ low and K+ dose was adjusted.  Repeat BMET today.      Dispo:  Return in about 2 months (around 09/01/2020) for Scheduled Follow Up w/ Dr. Johnsie Cancel.   Medication Adjustments/Labs and Tests Ordered: Current medicines are reviewed at length with the patient today.  Concerns regarding medicines are outlined above.  Tests Ordered: Orders Placed This Encounter  Procedures  . CT CORONARY MORPH W/CTA COR W/SCORE W/CA W/CM &/OR WO/CM  . CT CORONARY FRACTIONAL FLOW RESERVE DATA PREP  . CT CORONARY FRACTIONAL FLOW RESERVE FLUID ANALYSIS  . Basic metabolic panel   Medication Changes: No orders of the defined types were placed in this encounter.   Signed, Richardson Dopp, PA-C  07/01/2020 3:59 PM    [facility] [address], [place], [place]  [address] Phone: [phone]; Fax: [phone]

## 2020-07-20 ENCOUNTER — Telehealth (HOSPITAL_COMMUNITY): Payer: Self-pay | Admitting: *Deleted

## 2020-07-20 NOTE — Telephone Encounter (Signed)
Reaching out to patient to offer assistance regarding upcoming cardiac imaging study; pt verbalizes understanding of appt date/time, parking situation and where to check in, and pre-test NPO status; name and call back number provided for further questions should they arise  [facility] [phone] office [phone] cell

## 2020-07-21 ENCOUNTER — Encounter (HOSPITAL_COMMUNITY): Payer: Self-pay

## 2020-07-21 ENCOUNTER — Other Ambulatory Visit: Payer: Self-pay

## 2020-07-21 ENCOUNTER — Ambulatory Visit (HOSPITAL_COMMUNITY)
Admission: RE | Admit: 2020-07-21 | Discharge: 2020-07-21 | Disposition: A | Discharge status: Home / Self Care | Payer: Medicare Other | Source: Ambulatory Visit | Attending: Physician Assistant | Admitting: Physician Assistant

## 2020-07-21 DIAGNOSIS — R9439 Abnormal result of other cardiovascular function study: Secondary | ICD-10-CM | POA: Diagnosis not present

## 2020-07-21 DIAGNOSIS — I2510 Atherosclerotic heart disease of native coronary artery without angina pectoris: Secondary | ICD-10-CM | POA: Diagnosis not present

## 2020-07-21 DIAGNOSIS — R9430 Abnormal result of cardiovascular function study, unspecified: Secondary | ICD-10-CM

## 2020-07-21 MED ORDER — NITROGLYCERIN 0.4 MG SL SUBL
SUBLINGUAL_TABLET | SUBLINGUAL | Status: AC
  Filled 2020-07-21: qty 2

## 2020-07-21 MED ORDER — NITROGLYCERIN 0.4 MG SL SUBL
0.8000 mg | SUBLINGUAL_TABLET | Freq: Once | SUBLINGUAL | Status: AC
  Administered 2020-07-21: 0.8 mg via SUBLINGUAL

## 2020-07-21 MED ORDER — IOHEXOL 350 MG/ML SOLN
80.0000 mL | Freq: Once | INTRAVENOUS | Status: AC | PRN
  Administered 2020-07-21: 80 mL via INTRAVENOUS

## 2020-07-21 NOTE — Discharge Instructions (Signed)
Cardiac CT Angiogram A cardiac CT angiogram is a procedure to look at the heart and the area around the heart. It may be done to help find the cause of chest pains or other symptoms of heart disease. During this procedure, a substance called contrast dye is injected into the blood vessels in the area to be checked. A large X-ray machine, called a CT scanner, then takes detailed pictures of the heart and the surrounding area. The procedure is also sometimes called a coronary CT angiogram, coronary artery scanning, or CTA. A cardiac CT angiogram allows the health care provider to see how well blood is flowing to and from the heart. The health care provider will be able to see if there are any problems, such as:  Blockage or narrowing of the coronary arteries in the heart.  Fluid around the heart.  Signs of weakness or disease in the muscles, valves, and tissues of the heart. Tell a health care provider about:  Any allergies you have. This is especially important if you have had a previous allergic reaction to contrast dye.  All medicines you are taking, including vitamins, herbs, eye drops, creams, and over-the-counter medicines.  Any blood disorders you have.  Any surgeries you have had.  Any medical conditions you have.  Whether you are pregnant or may be pregnant.  Any anxiety disorders, chronic pain, or other conditions you have that may increase your stress or prevent you from lying still. What are the risks? Generally, this is a safe procedure. However, problems may occur, including:  Bleeding.  Infection.  Allergic reactions to medicines or dyes.  Damage to other structures or organs.  Kidney damage from the contrast dye that is used.  Increased risk of cancer from radiation exposure. This risk is low. Talk with your health care provider about: ? The risks and benefits of testing. ? How you can receive the lowest dose of radiation. What happens before the  procedure?  Wear comfortable clothing and remove any jewelry, glasses, dentures, and hearing aids.  Follow instructions from your health care provider about eating and drinking. This may include: ? For 12 hours before the procedure -- avoid caffeine. This includes tea, coffee, soda, energy drinks, and diet pills. Drink plenty of water or other fluids that do not have caffeine in them. Being well hydrated can prevent complications. ? For 4-6 hours before the procedure -- stop eating and drinking. The contrast dye can cause nausea, but this is less likely if your stomach is empty.  Ask your health care provider about changing or stopping your regular medicines. This is especially important if you are taking diabetes medicines, blood thinners, or medicines to treat problems with erections (erectile dysfunction). What happens during the procedure?   Hair on your chest may need to be removed so that small sticky patches called electrodes can be placed on your chest. These will transmit information that helps to monitor your heart during the procedure.  An IV will be inserted into one of your veins.  You might be given a medicine to control your heart rate during the procedure. This will help to ensure that good images are obtained.  You will be asked to lie on an exam table. This table will slide in and out of the CT machine during the procedure.  Contrast dye will be injected into the IV. You might feel warm, or you may get a metallic taste in your mouth.  You will be given a medicine called   nitroglycerin. This will relax or dilate the arteries in your heart.  The table that you are lying on will move into the CT machine tunnel for the scan.  The person running the machine will give you instructions while the scans are being done. You may be asked to: ? Keep your arms above your head. ? Hold your breath. ? Stay very still, even if the table is moving.  When the scanning is complete, you  will be moved out of the machine.  The IV will be removed. The procedure may vary among health care providers and hospitals. What can I expect after the procedure? After your procedure, it is common to have:  A metallic taste in your mouth from the contrast dye.  A feeling of warmth.  A headache from the nitroglycerin. Follow these instructions at home:  Take over-the-counter and prescription medicines only as told by your health care provider.  If you are told, drink enough fluid to keep your urine pale yellow. This will help to flush the contrast dye out of your body.  Most people can return to their normal activities right after the procedure. Ask your health care provider what activities are safe for you.  It is up to you to get the results of your procedure. Ask your health care provider, or the department that is doing the procedure, when your results will be ready.  Keep all follow-up visits as told by your health care provider. This is important. Contact a health care provider if:  You have any symptoms of allergy to the contrast dye. These include: ? Shortness of breath. ? Rash or hives. ? A racing heartbeat. Summary  A cardiac CT angiogram is a procedure to look at the heart and the area around the heart. It may be done to help find the cause of chest pains or other symptoms of heart disease.  During this procedure, a large X-ray machine, called a CT scanner, takes detailed pictures of the heart and the surrounding area after a contrast dye has been injected into blood vessels in the area.  Ask your health care provider about changing or stopping your regular medicines before the procedure. This is especially important if you are taking diabetes medicines, blood thinners, or medicines to treat erectile dysfunction.  If you are told, drink enough fluid to keep your urine pale yellow. This will help to flush the contrast dye out of your body. This information is not  intended to replace advice given to you by your health care provider. Make sure you discuss any questions you have with your health care provider. Document Revised: 07/24/2019 Document Reviewed: 07/24/2019 Elsevier Patient Education  2020 Else[employer] Inc[employer] Testing With IV Contrast Material IV contrast material is a fluid that is used with some imaging tests. It is injected into your body through a vein. Contrast material is used when your health care providers need a detailed look at organs, tissues, or blood vessels that may not show up with the standard test. The material may be used when an X-ray, an MRI, a CT scan, or an ultrasound is done. IV contrast material may be used for imaging tests that check:  Muscles, skin, and fat.  Breasts.  Brain.  Digestive tract.  Heart.  Organs such as the liver, kidneys, lungs, bladder, and many others.  Arteries and veins. Tell a health care provider about:  Any allergies you have, especially an allergy to contrast material.  All medicines you are taking, including   metformin, beta blockers, NSAIDs (such as ibuprofen), interleukin-2, vitamins, herbs, eye drops, creams, and over-the-counter medicines.  Any problems you or family members have had with the use of contrast material.  Any blood disorders you have, such as sickle cell anemia.  Any surgeries you have had.  Any medical conditions you have or have had, especially alcohol abuse, dehydration, asthma, or kidney, liver, or heart problems.  Whether you are pregnant or may be pregnant.  Whether you are breastfeeding. Most contrast materials are safe for use in breastfeeding women. What are the risks? Generally, this is a safe procedure. However, problems may occur, including:  Headache.  Itching, skin rash, and hives.  Nausea and vomiting.  Allergic reactions.  Wheezing or difficulty breathing.  Abnormal heart rate.  Changes in blood pressure.  Throat swelling.  Kidney  damage. What happens before the procedure? Medicines Ask your health care provider about:  Changing or stopping your regular medicines. This is especially important if you are taking diabetes medicines or blood thinners.  Taking medicines such as aspirin and ibuprofen. These medicines can thin your blood. Do not take these medicines unless your health care provider tells you to take them.  Taking over-the-counter medicines, vitamins, herbs, and supplements. If you are at risk of having a reaction to the IV contrast material, you may be asked to take medicine before the procedure to prevent a reaction. General instructions  Follow instructions from your health care provider about eating or drinking restrictions.  You may have an exam or lab tests to make sure that you can safely get IV contrast material.  Ask if you will be given a medicine to help you relax (sedative) during the procedure. If so, plan to have someone take you home from the hospital or clinic. What happens during the procedure?  You may be given a sedative to help you relax.  An IV will be inserted into one of your veins.  Contrast material will be injected into your IV.  You may feel warmth or flushing as the contrast material enters your bloodstream.  You may have a metallic taste in your mouth for a few minutes.  The needle may cause some discomfort and bruising.  After the contrast material is in your body, the imaging test will be done. The procedure may vary among health care providers and hospitals. What can I expect after the procedure?  The IV will be removed.  You may be taken to a recovery area if sedation medicines were used. Your blood pressure, heart rate, breathing rate, and blood oxygen level will be monitored until you leave the hospital or clinic. Follow these instructions at home:   Take over-the-counter and prescription medicines only as told by your health care provider. ? Your health  care provider may tell you to not take certain medicines for a couple of days after the procedure. This is especially important if you are taking diabetes medicines.  If you are told, drink enough fluid to keep your urine pale yellow. This will help to remove the contrast material out of your body.  Do not drive for 24 hours if you were given a sedative during your procedure.  It is up to you to get the results of your procedure. Ask your health care provider, or the department that is doing the procedure, when your results will be ready.  Keep all follow-up visits as told by your health care provider. This is important. Contact a health care provider if:    You have redness, swelling, or pain near your IV site. Get help right away if:  You have an abnormal heart rhythm.  You have trouble breathing.  You have: ? Chest pain. ? Pain in your back, neck, arm, jaw, or stomach. ? Nausea or sweating. ? Hives or a rash.  You start shaking and cannot stop. These symptoms may represent a serious problem that is an emergency. Do not wait to see if the symptoms will go away. Get medical help right away. Call your local emergency services (911 in the [place] Do not drive yourself to the hospital. Summary  IV contrast material may be used for imaging tests to help your health care providers see your organs and tissues more clearly.  Tell your health care provider if you are pregnant or may be pregnant.  During the procedure, you may feel warmth or flushing as the contrast material enters your bloodstream.  After the procedure, drink enough fluid to keep your urine pale yellow. This information is not intended to replace advice given to you by your health care provider. Make sure you discuss any questions you have with your health care provider. Document Revised: 02/14/2019 Document Reviewed: 02/14/2019 Elsevier Patient Education  2020 Elsevier Inc.  

## 2020-07-21 NOTE — Progress Notes (Signed)
Noticed ST elevation on ECG reading during Cardiac CTA.  Pt denies any chest pain or SOB.  According to ECG reading on cardiac monitor in radiology nurses station, there appears to be transient ST elevation shown in lead II, no other leads show elevation.  Pt does have a 1st degree AV block.  Called and paged Dr. Harrell Gave, no return call received after 15 min.  Pt denies chest pain or SOB.  Pt instructed to call 9-1-1 if he experiences chest pain without relief or SOB.  PIV removed, dressing applied, and pt discharged

## 2020-07-28 ENCOUNTER — Ambulatory Visit (HOSPITAL_COMMUNITY)
Admission: RE | Admit: 2020-07-28 | Discharge: 2020-07-28 | Disposition: A | Discharge status: Home / Self Care | Payer: Medicare Other | Source: Ambulatory Visit | Attending: Physician Assistant | Admitting: Physician Assistant

## 2020-07-28 DIAGNOSIS — R931 Abnormal findings on diagnostic imaging of heart and coronary circulation: Secondary | ICD-10-CM

## 2020-07-28 DIAGNOSIS — R9439 Abnormal result of other cardiovascular function study: Secondary | ICD-10-CM | POA: Diagnosis not present

## 2020-07-29 DIAGNOSIS — Z6824 Body mass index (BMI) 24.0-24.9, adult: Secondary | ICD-10-CM | POA: Diagnosis not present

## 2020-07-29 DIAGNOSIS — R9439 Abnormal result of other cardiovascular function study: Secondary | ICD-10-CM | POA: Diagnosis not present

## 2020-07-29 DIAGNOSIS — R931 Abnormal findings on diagnostic imaging of heart and coronary circulation: Secondary | ICD-10-CM | POA: Diagnosis not present

## 2020-07-29 DIAGNOSIS — I2510 Atherosclerotic heart disease of native coronary artery without angina pectoris: Secondary | ICD-10-CM | POA: Diagnosis not present

## 2020-07-30 ENCOUNTER — Telehealth: Payer: Self-pay | Admitting: Physician Assistant

## 2020-07-30 MED ORDER — NITROGLYCERIN 0.4 MG SL SUBL
0.4000 mg | SUBLINGUAL_TABLET | SUBLINGUAL | 3 refills | Status: DC | PRN
Start: 2020-07-30 — End: 2020-08-31

## 2020-07-30 NOTE — Telephone Encounter (Signed)
I called and spoke with the cath lab, cath is scheduled for 08/07/20 at 10:30AM with Dr. Martinique. Nitroglycerin prescription sent to pharmacy; patient and wife both aware how to use Nitroglycerin. Patient will see Nicki Reaper on 08/05/20 with Covid test the same day. Patient and wife Suanne Marker are both aware to have patient hold Apixaban 2 days prior to procedure starting on 08/05/20. Instructions typed and pended to review with patient at office visit.

## 2020-07-30 NOTE — Telephone Encounter (Signed)
Results of coronary CTA reviewed with Dr. Johnsie Cancel.  He recommends the patient undergo cardiac catheterization and stop taking flecainide.    I reviewed this with the patient by phone today.  He will need an office visit for an H&P.  He has been doing well without chest pain.  He has occasional palpitations.  I advised him to call us or go to the emergency room if he should develop chest discomfort.  PLAN:  1.  Please call patient and make sure he knows to stop taking flecainide. 2.  Please arrange office visit next week with me or any APP who has availability (Dr. Johnsie Cancel is out of the office). 3.  See if cardiac catheterization can be scheduled for 2 to 3 days after office visit and arrange COVID test after OV. 4.  He will need to hold Apixaban 2 days prior to his procedure. 5.  Send in Rx for as needed nitroglycerin.  I reviewed this with the patient today. Richardson Dopp, PA-C    07/30/2020 8:50 AM

## 2020-08-05 ENCOUNTER — Ambulatory Visit (INDEPENDENT_AMBULATORY_CARE_PROVIDER_SITE_OTHER): Payer: Medicare Other | Admitting: Physician Assistant

## 2020-08-05 ENCOUNTER — Other Ambulatory Visit: Payer: Self-pay

## 2020-08-05 ENCOUNTER — Encounter: Payer: Self-pay | Admitting: Physician Assistant

## 2020-08-05 ENCOUNTER — Ambulatory Visit: Payer: Medicare Other | Admitting: Physician Assistant

## 2020-08-05 ENCOUNTER — Other Ambulatory Visit (HOSPITAL_COMMUNITY)
Admission: RE | Admit: 2020-08-05 | Discharge: 2020-08-05 | Disposition: A | Discharge status: Home / Self Care | Payer: Medicare Other | Source: Ambulatory Visit | Attending: Cardiology | Admitting: Cardiology

## 2020-08-05 VITALS — BP 138/80 | HR 49 | Ht 71.0 in | Wt 179.0 lb

## 2020-08-05 DIAGNOSIS — I10 Essential (primary) hypertension: Secondary | ICD-10-CM

## 2020-08-05 DIAGNOSIS — Z20822 Contact with and (suspected) exposure to covid-19: Secondary | ICD-10-CM | POA: Insufficient documentation

## 2020-08-05 DIAGNOSIS — I2510 Atherosclerotic heart disease of native coronary artery without angina pectoris: Secondary | ICD-10-CM

## 2020-08-05 DIAGNOSIS — I4819 Other persistent atrial fibrillation: Secondary | ICD-10-CM

## 2020-08-05 DIAGNOSIS — Z01812 Encounter for preprocedural laboratory examination: Secondary | ICD-10-CM | POA: Insufficient documentation

## 2020-08-05 LAB — SARS CORONAVIRUS 2 (TAT 6-24 HRS): SARS Coronavirus 2: NEGATIVE

## 2020-08-05 NOTE — Progress Notes (Signed)
Cardiology Office Note:    Date:  08/05/2020   ID:  Joseph Larsen, DOB 04-14-1946, MRN [id number]  PCP:  Joseph Norlander, DO  Cardiologist:  Joseph Rouge, MD   Electrophysiologist:  None   Referring MD: Joseph Norlander, DO   Chief Complaint:  Follow-up (Needs cath)    Patient Profile:    Joseph Larsen is a 74 y.o. male with:   Paroxysmal >> persistent atrial fibrillation ? S/p DCCV 05/15/20 >> ERAF ? Flecainide started 6/21 ? ETT-Myoview - no ischemia; no VT ? DCCV 06/01/20 >> NSR  Hypertension  Hyperlipidemia  Alcohol abuse  Chest pain ? Myoview 6/19: No ischemia ? Myoview 6/21: Small inferoapical infarct, no ischemia, low risk  Echo 6/19: EF 60-65  Echo 5/21: EF 55-60, LAE, LVH   Prior CV studies: Coronary CTA 07/21/20 Ca score 3159 (95th percentile) RCA prox 25-49, mid 50-69, dist 50-69 LM dist 24-49 LAD prox > 70, mid 50-69 LCx ost 1-24, mid 25-49 No acute extracardiac findings Aortic atherosclerosis Trace bilateral pleural fluid  FFR 1. Left Main:  No significant stenosis. FFR = 0.89 2. LAD: Significant stenosis. FFR 0.76 at takeoff of first diagonal, Mid FFR = 0.66, Distal FFR = 0.58 3. LCX: No significant stenosis. Proximal FFR = 0.86 4. RCA: Unable to be interpreted due to artifact. IMPRESSION: 1. CT FFR analysis showed significant stenosis in the LAD as noted, beginning near the takeoff of the first significant diagonal and extending distally.     ETT-Myoview 05/29/2020 Small inferior apical wall infarct, no ischemia, not gated due to atrial fibrillation, low risk ETT portion normal  Event monitor 05/04/2020 Atrial fibrillation, average heart rate 81, 3.1-second pause, <1% PVCs  Echocardiogram 04/23/2020 EF 55-60, no RWMA, mild LVH, normal RV SF, severe LAE, mildly dilated aortic root (39 mm), dilated ascending aorta (40 mm)  History of Present Illness:    [name] was last seen in clinic in 7/21.  He was maintaining normal sinus  rhythm on Flecainide after his most recent DCCV.  His exercise Myoview suggested possible inferior scar and a Coronary CTA was arranged to rule out significant CAD as he is on a Class 1c antiarrhythmic.  His CT demonstrated potentially hemodynamically significant stenosis in the LAD by FFR.  There was significant artifact in the RCA and not interpretable.  Dr. Johnsie Cancel recommended stopping the patient's Flecainide and arrange a cardiac catheterization.  The patient returns to arrange a cardiac catheterization.  He is here with his wife.  He is overall doing well.  He has not had chest pain, shortness of breath, syncope, leg swelling.  He is walking about 2 miles a day.       Past Medical History:  Diagnosis Date  . Abnormal ECG    a. 08/2012 Abnl ETT with ST changes in recovery;  b. 08/2012 Ex MV, ex time 10:00, EF 66%, no ischemia/infarct.  . Allergic rhinitis   . Anxiety   . Cataract   . Depression   . Diverticular disease   . Dyspnea on exertion   . ETOH abuse   . GERD (gastroesophageal reflux disease)   . Gout   . Hemorrhoids   . Hiatal hernia   . HTN (hypertension)   . Low testosterone   . MACROCYTIC ANEMIA   . NONSPEC ELEVATION OF LEVELS OF TRANSAMINASE/LDH   . Nonspecific abnormal finding in stool contents   . Palpitations   . PTSD (post-traumatic stress disorder)   . PTSD (post-traumatic stress  disorder)    [name]    Current Medications: Current Meds  Medication Sig  . acetaminophen (TYLENOL) 500 MG tablet Take 1,000 mg by mouth daily as needed for moderate pain or headache.  . allopurinol (ZYLOPRIM) 300 MG tablet Take 150 mg by mouth daily.  Marland Kitchen ALPRAZolam (XANAX) 0.5 MG tablet Take 0.25 mg by mouth at bedtime.   Marland Kitchen atenolol (TENORMIN) 25 MG tablet Take 0.5 tablets (12.5 mg total) by mouth daily.  . Cholecalciferol (VITAMIN D) 50 MCG (2000 UT) tablet Take 2,000 Units by mouth daily.  . Cyanocobalamin 1000 MCG/ML KIT Inject 1,000 mcg as directed every 30 (thirty)  days.  . fluticasone (FLONASE) 50 MCG/ACT nasal spray Place 2 sprays into both nostrils 2 (two) times daily as needed for allergies or rhinitis.  . hydrochlorothiazide (HYDRODIURIL) 25 MG tablet Take 25 mg by mouth daily.   Marland Kitchen losartan (COZAAR) 100 MG tablet Take 50 mg by mouth daily.  . Misc Natural Products (OSTEO BI-FLEX ADV TRIPLE ST) TABS Take 1 tablet by mouth daily.  . nitroGLYCERIN (NITROSTAT) 0.4 MG SL tablet Place 1 tablet (0.4 mg total) under the tongue every 5 (five) minutes as needed for chest pain.  . Omega-3 Fatty Acids (FISH OIL) 1000 MG CAPS Take 2,000 mg by mouth 2 (two) times daily.   Marland Kitchen omeprazole (PRILOSEC) 20 MG capsule Take 20 mg by mouth 2 (two) times daily.   . potassium chloride SA (KLOR-CON) 20 MEQ tablet Take 1 tablet (20 mEq total) by mouth 2 (two) times daily.  . QUEtiapine (SEROQUEL) 100 MG tablet Take 25 mg by mouth at bedtime. Pt only takes 1/4 tab   . tamsulosin (FLOMAX) 0.4 MG CAPS capsule Take 0.4 mg by mouth daily after breakfast.   Current Facility-Administered Medications for the 08/05/20 encounter (Office Visit) with Joseph Dopp T, PA-C  Medication  . cyanocobalamin ((VITAMIN B-12)) injection 1,000 mcg     Allergies:   Terazosin, Enalapril maleate, Lisinopril, and Penicillins   Social History   Tobacco Use  . Smoking status: Former Smoker    Packs/day: 3.00    Types: Cigarettes    Start date: 02/09/1965    Quit date: 05/12/1982    Years since quitting: 38.2  . Smokeless tobacco: Never Used  Vaping Use  . Vaping Use: Never used  Substance Use Topics  . Alcohol use: Yes    Alcohol/week: 14.0 standard drinks    Types: 14 Cans of beer per week    Comment: drinks 2-3 cans of beer/day    . Drug use: No    Comment: 1 cup of decaf coffee/day.     Family Hx: The patient's family history includes Heart disease in his father and mother; Heart murmur in his sister; Hypertension in his brother, sister, sister, sister, and sister; Liver disease in his  brother. There is no history of Colon cancer.  Review of Systems  Respiratory: Negative for hemoptysis.   Gastrointestinal: Negative for hematemesis, hematochezia and melena.  Genitourinary: Negative for hematuria.  All other systems reviewed and are negative.    EKGs/Labs/Other Test Reviewed:    EKG:  EKG is   ordered today.  The ekg ordered today demonstrates sinus brady, HR 49, normal axis, non-specific ST-TW changes, QTc 415  Recent Labs: 04/13/2020: ALT 31; TSH 2.560 05/18/2020: Magnesium 1.7 05/26/2020: Hemoglobin 17.9; Platelets 137 07/01/2020: BUN 9; Creatinine, Ser 0.85; Potassium 3.9; Sodium 136   Recent Lipid Panel Lab Results  Component Value Date/Time   CHOL  98 (L) 04/13/2020 08:39 AM   TRIG 68 04/13/2020 08:39 AM   TRIG 82 03/25/2015 09:50 AM   HDL 28 (L) 04/13/2020 08:39 AM   HDL 38 (L) 03/25/2015 09:50 AM   CHOLHDL 3.5 04/13/2020 08:39 AM   LDLCALC 55 04/13/2020 08:39 AM   LDLCALC 54 07/17/2014 08:28 AM   LDLDIRECT 74 01/03/2017 11:00 AM    Physical Exam:    VS:  BP 138/80   Pulse (!) 49   Ht _0  (1.803 m)   Wt 179 lb (81.2 kg)   SpO2 98%   BMI 24.97 kg/m     Wt Readings from Last 3 Encounters:  08/05/20 179 lb (81.2 kg)  07/01/20 177 lb (80.3 kg)  06/18/20 180 lb (81.6 kg)     Constitutional:      Appearance: Healthy appearance. Not in distress.  Neck:     Thyroid: No thyromegaly.     Vascular: No carotid bruit. JVD normal.     Lymphadenopathy: No cervical adenopathy.  Pulmonary:     Effort: Pulmonary effort is normal.     Breath sounds: No wheezing. No rales.  Cardiovascular:     Normal rate. Regular rhythm. Normal S1. Normal S2.     Murmurs: There is no murmur.  Pulses:    Intact distal pulses.  Edema:    Peripheral edema absent.  Abdominal:     Palpations: Abdomen is soft. There is no hepatomegaly.  Skin:    General: Skin is warm and dry.  Neurological:     General: No focal deficit present.     Mental Status: Alert and oriented  to person, place and time.     Cranial Nerves: Cranial nerves are intact.  Psychiatric:        Mood and Affect: Affect normal.       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  However, he did have suggestion of inferoapical infarct on nuclear images. Therefore, he underwent Coronary CTA as he was on Flecainide for rhythm control.  This suggests hemodynamically significant disease in the LAD.  He is not really having anginal symptoms.  However, with the suggestion of such significant disease in the LAD, cardiac catheterization has been recommended.  Risks and benefits of cardiac catheterization have been discussed with the patient.  These include bleeding, infection, kidney damage, stroke, heart attack, death.  The patient understands these risks and is willing to proceed.  -Cardiac catheterization 08/07/20 with Dr. Martinique  -Hold Apixaban x 2 days prior to cath  -CBC, BMET today  -COVID test today  -Keep follow up with Dr. Johnsie Cancel in 2 weeks.   2. Persistent atrial fibrillation (HCC) Maintaining normal sinus rhythm.  He is bradycardic but asymptomatic.  He is not off of Flecainide given his coronary artery disease.  If he needs AAD Rx in the future, consider Dofetilide.  I am not certain he could tolerate Amiodarone given his low HR.  Continue Apixaban.  This is currently on hold for his cardiac catheterization.    3. Essential hypertension Fair control.  Continue current medications.       Dispo:  Return in 2 weeks (on 08/19/2020) for Scheduled Follow Up  w/ Dr. Johnsie Cancel.   Medication Adjustments/Labs and Tests Ordered: Current medicines are reviewed at length with the patient today.  Concerns regarding medicines are outlined above.  Tests Ordered: Orders Placed This Encounter  Procedures  .  Basic metabolic panel  . CBC  . EKG 12-Lead   Medication Changes: No orders of the defined types were placed in this  encounter.   Signed, Joseph Dopp, PA-C  08/05/2020 2:29 PM    [facility] [address], [place], [place]  [address] Phone: [phone]; Fax: [phone]

## 2020-08-05 NOTE — Patient Instructions (Signed)
Medication Instructions:  Your physician recommends that you continue on your current medications as directed. Please refer to the Current Medication list given to you today.  *If you need a refill on your cardiac medications before your next appointment, please call your pharmacy*  Lab Work: You will have labs drawn today: BMET/CBC  If you have labs (blood work) drawn today and your tests are completely normal, you will receive your results only by: Marland Kitchen MyChart Message (if you have MyChart) OR . A paper copy in the mail If you have any lab test that is abnormal or we need to change your treatment, we will call you to review the results.  Testing/Procedures: Your physician has requested that you have a cardiac catheterization. Cardiac catheterization is used to diagnose and/or treat various heart conditions. Doctors may recommend this procedure for a number of different reasons. The most common reason is to evaluate chest pain. Chest pain can be a symptom of coronary artery disease (CAD), and cardiac catheterization can show whether plaque is narrowing or blocking your heart's arteries. This procedure is also used to evaluate the valves, as well as measure the blood flow and oxygen levels in different parts of your heart. For further information please visit [email or web address]. Please follow instruction sheet, as given.  Follow-Up: Keep appointment on 08/19/20 with Jenkins Rouge, MD

## 2020-08-05 NOTE — H&P (View-Only) (Signed)
Cardiology Office Note:    Date:  08/05/2020   ID:  Joseph Larsen, DOB 04-14-1946, MRN [id number]  PCP:  [name], DO  Cardiologist:  Jenkins Rouge, MD   Electrophysiologist:  None   Referring MD: [name], DO   Chief Complaint:  Follow-up (Needs cath)    Patient Profile:    Joseph Larsen is a 74 y.o. male with:   Paroxysmal >> persistent atrial fibrillation ? S/p DCCV 05/15/20 >> ERAF ? Flecainide started 6/21 ? ETT-Myoview - no ischemia; no VT ? DCCV 06/01/20 >> NSR  Hypertension  Hyperlipidemia  Alcohol abuse  Chest pain ? Myoview 6/19: No ischemia ? Myoview 6/21: Small inferoapical infarct, no ischemia, low risk  Echo 6/19: EF 60-65  Echo 5/21: EF 55-60, LAE, LVH   Prior CV studies: Coronary CTA 07/21/20 Ca score 3159 (95th percentile) RCA prox 25-49, mid 50-69, dist 50-69 LM dist 24-49 LAD prox > 70, mid 50-69 LCx ost 1-24, mid 25-49 No acute extracardiac findings Aortic atherosclerosis Trace bilateral pleural fluid  FFR 1. Left Main:  No significant stenosis. FFR = 0.89 2. LAD: Significant stenosis. FFR 0.76 at takeoff of first diagonal, Mid FFR = 0.66, Distal FFR = 0.58 3. LCX: No significant stenosis. Proximal FFR = 0.86 4. RCA: Unable to be interpreted due to artifact. IMPRESSION: 1. CT FFR analysis showed significant stenosis in the LAD as noted, beginning near the takeoff of the first significant diagonal and extending distally.     ETT-Myoview 05/29/2020 Small inferior apical wall infarct, no ischemia, not gated due to atrial fibrillation, low risk ETT portion normal  Event monitor 05/04/2020 Atrial fibrillation, average heart rate 81, 3.1-second pause, <1% PVCs  Echocardiogram 04/23/2020 EF 55-60, no RWMA, mild LVH, normal RV SF, severe LAE, mildly dilated aortic root (39 mm), dilated ascending aorta (40 mm)  History of Present Illness:    [name] was last seen in clinic in 7/21.  He was maintaining normal sinus  rhythm on Flecainide after his most recent DCCV.  His exercise Myoview suggested possible inferior scar and a Coronary CTA was arranged to rule out significant CAD as he is on a Class 1c antiarrhythmic.  His CT demonstrated potentially hemodynamically significant stenosis in the LAD by FFR.  There was significant artifact in the RCA and not interpretable.  Dr. Johnsie Cancel recommended stopping the patient's Flecainide and arrange a cardiac catheterization.  The patient returns to arrange a cardiac catheterization.  He is here with his wife.  He is overall doing well.  He has not had chest pain, shortness of breath, syncope, leg swelling.  He is walking about 2 miles a day.       Past Medical History:  Diagnosis Date  . Abnormal ECG    a. 08/2012 Abnl ETT with ST changes in recovery;  b. 08/2012 Ex MV, ex time 10:00, EF 66%, no ischemia/infarct.  . Allergic rhinitis   . Anxiety   . Cataract   . Depression   . Diverticular disease   . Dyspnea on exertion   . ETOH abuse   . GERD (gastroesophageal reflux disease)   . Gout   . Hemorrhoids   . Hiatal hernia   . HTN (hypertension)   . Low testosterone   . MACROCYTIC ANEMIA   . NONSPEC ELEVATION OF LEVELS OF TRANSAMINASE/LDH   . Nonspecific abnormal finding in stool contents   . Palpitations   . PTSD (post-traumatic stress disorder)   . PTSD (post-traumatic stress  disorder)    [name]    Current Medications: Current Meds  Medication Sig  . acetaminophen (TYLENOL) 500 MG tablet Take 1,000 mg by mouth daily as needed for moderate pain or headache.  . allopurinol (ZYLOPRIM) 300 MG tablet Take 150 mg by mouth daily.  Marland Kitchen ALPRAZolam (XANAX) 0.5 MG tablet Take 0.25 mg by mouth at bedtime.   Marland Kitchen atenolol (TENORMIN) 25 MG tablet Take 0.5 tablets (12.5 mg total) by mouth daily.  . Cholecalciferol (VITAMIN D) 50 MCG (2000 UT) tablet Take 2,000 Units by mouth daily.  . Cyanocobalamin 1000 MCG/ML KIT Inject 1,000 mcg as directed every 30 (thirty)  days.  . fluticasone (FLONASE) 50 MCG/ACT nasal spray Place 2 sprays into both nostrils 2 (two) times daily as needed for allergies or rhinitis.  . hydrochlorothiazide (HYDRODIURIL) 25 MG tablet Take 25 mg by mouth daily.   Marland Kitchen losartan (COZAAR) 100 MG tablet Take 50 mg by mouth daily.  . Misc Natural Products (OSTEO BI-FLEX ADV TRIPLE ST) TABS Take 1 tablet by mouth daily.  . nitroGLYCERIN (NITROSTAT) 0.4 MG SL tablet Place 1 tablet (0.4 mg total) under the tongue every 5 (five) minutes as needed for chest pain.  . Omega-3 Fatty Acids (FISH OIL) 1000 MG CAPS Take 2,000 mg by mouth 2 (two) times daily.   Marland Kitchen omeprazole (PRILOSEC) 20 MG capsule Take 20 mg by mouth 2 (two) times daily.   . potassium chloride SA (KLOR-CON) 20 MEQ tablet Take 1 tablet (20 mEq total) by mouth 2 (two) times daily.  . QUEtiapine (SEROQUEL) 100 MG tablet Take 25 mg by mouth at bedtime. Pt only takes 1/4 tab   . tamsulosin (FLOMAX) 0.4 MG CAPS capsule Take 0.4 mg by mouth daily after breakfast.   Current Facility-Administered Medications for the 08/05/20 encounter (Office Visit) with Richardson Dopp T, PA-C  Medication  . cyanocobalamin ((VITAMIN B-12)) injection 1,000 mcg     Allergies:   Terazosin, Enalapril maleate, Lisinopril, and Penicillins   Social History   Tobacco Use  . Smoking status: Former Smoker    Packs/day: 3.00    Types: Cigarettes    Start date: 02/09/1965    Quit date: 05/12/1982    Years since quitting: 38.2  . Smokeless tobacco: Never Used  Vaping Use  . Vaping Use: Never used  Substance Use Topics  . Alcohol use: Yes    Alcohol/week: 14.0 standard drinks    Types: 14 Cans of beer per week    Comment: drinks 2-3 cans of beer/day    . Drug use: No    Comment: 1 cup of decaf coffee/day.     Family Hx: The patient's family history includes Heart disease in his father and mother; Heart murmur in his sister; Hypertension in his brother, sister, sister, sister, and sister; Liver disease in his  brother. There is no history of Colon cancer.  Review of Systems  Respiratory: Negative for hemoptysis.   Gastrointestinal: Negative for hematemesis, hematochezia and melena.  Genitourinary: Negative for hematuria.  All other systems reviewed and are negative.    EKGs/Labs/Other Test Reviewed:    EKG:  EKG is   ordered today.  The ekg ordered today demonstrates sinus brady, HR 49, normal axis, non-specific ST-TW changes, QTc 415  Recent Labs: 04/13/2020: ALT 31; TSH 2.560 05/18/2020: Magnesium 1.7 05/26/2020: Hemoglobin 17.9; Platelets 137 07/01/2020: BUN 9; Creatinine, Ser 0.85; Potassium 3.9; Sodium 136   Recent Lipid Panel Lab Results  Component Value Date/Time   CHOL  98 (L) 04/13/2020 08:39 AM   TRIG 68 04/13/2020 08:39 AM   TRIG 82 03/25/2015 09:50 AM   HDL 28 (L) 04/13/2020 08:39 AM   HDL 38 (L) 03/25/2015 09:50 AM   CHOLHDL 3.5 04/13/2020 08:39 AM   LDLCALC 55 04/13/2020 08:39 AM   LDLCALC 54 07/17/2014 08:28 AM   LDLDIRECT 74 01/03/2017 11:00 AM    Physical Exam:    VS:  BP 138/80   Pulse (!) 49   Ht _0  (1.803 m)   Wt 179 lb (81.2 kg)   SpO2 98%   BMI 24.97 kg/m     Wt Readings from Last 3 Encounters:  08/05/20 179 lb (81.2 kg)  07/01/20 177 lb (80.3 kg)  06/18/20 180 lb (81.6 kg)     Constitutional:      Appearance: Healthy appearance. Not in distress.  Neck:     Thyroid: No thyromegaly.     Vascular: No carotid bruit. JVD normal.     Lymphadenopathy: No cervical adenopathy.  Pulmonary:     Effort: Pulmonary effort is normal.     Breath sounds: No wheezing. No rales.  Cardiovascular:     Normal rate. Regular rhythm. Normal S1. Normal S2.     Murmurs: There is no murmur.  Pulses:    Intact distal pulses.  Edema:    Peripheral edema absent.  Abdominal:     Palpations: Abdomen is soft. There is no hepatomegaly.  Skin:    General: Skin is warm and dry.  Neurological:     General: No focal deficit present.     Mental Status: Alert and oriented  to person, place and time.     Cranial Nerves: Cranial nerves are intact.  Psychiatric:        Mood and Affect: Affect normal.       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  However, he did have suggestion of inferoapical infarct on nuclear images. Therefore, he underwent Coronary CTA as he was on Flecainide for rhythm control.  This suggests hemodynamically significant disease in the LAD.  He is not really having anginal symptoms.  However, with the suggestion of such significant disease in the LAD, cardiac catheterization has been recommended.  Risks and benefits of cardiac catheterization have been discussed with the patient.  These include bleeding, infection, kidney damage, stroke, heart attack, death.  The patient understands these risks and is willing to proceed.  -Cardiac catheterization 08/07/20 with Dr. Martinique  -Hold Apixaban x 2 days prior to cath  -CBC, BMET today  -COVID test today  -Keep follow up with Dr. Johnsie Cancel in 2 weeks.   2. Persistent atrial fibrillation (HCC) Maintaining normal sinus rhythm.  He is bradycardic but asymptomatic.  He is not off of Flecainide given his coronary artery disease.  If he needs AAD Rx in the future, consider Dofetilide.  I am not certain he could tolerate Amiodarone given his low HR.  Continue Apixaban.  This is currently on hold for his cardiac catheterization.    3. Essential hypertension Fair control.  Continue current medications.       Dispo:  Return in 2 weeks (on 08/19/2020) for Scheduled Follow Up  w/ Dr. Johnsie Cancel.   Medication Adjustments/Labs and Tests Ordered: Current medicines are reviewed at length with the patient today.  Concerns regarding medicines are outlined above.  Tests Ordered: Orders Placed This Encounter  Procedures  .  Basic metabolic panel  . CBC  . EKG 12-Lead   Medication Changes: No orders of the defined types were placed in this  encounter.   Signed, Richardson Dopp, PA-C  08/05/2020 2:29 PM    [facility] [address], [place], [place]  [address] Phone: [phone]; Fax: [phone]

## 2020-08-06 ENCOUNTER — Encounter (HOSPITAL_COMMUNITY): Payer: Self-pay | Admitting: Internal Medicine

## 2020-08-06 ENCOUNTER — Telehealth: Payer: Self-pay | Admitting: *Deleted

## 2020-08-06 LAB — CBC
Hematocrit: 44.8 % (ref 37.5–51.0)
Hemoglobin: 15.8 g/dL (ref 13.0–17.7)
MCH: 33.6 pg — ABNORMAL HIGH (ref 26.6–33.0)
MCHC: 35.3 g/dL (ref 31.5–35.7)
MCV: 95 fL (ref 79–97)
Platelets: 171 10*3/uL (ref 150–450)
RBC: 4.7 x10E6/uL (ref 4.14–5.80)
RDW: 13.3 % (ref 11.6–15.4)
WBC: 6.2 10*3/uL (ref 3.4–10.8)

## 2020-08-06 LAB — BASIC METABOLIC PANEL
BUN/Creatinine Ratio: 11 (ref 10–24)
BUN: 9 mg/dL (ref 8–27)
CO2: 27 mmol/L (ref 20–29)
Calcium: 9.1 mg/dL (ref 8.6–10.2)
Chloride: 98 mmol/L (ref 96–106)
Creatinine, Ser: 0.83 mg/dL (ref 0.76–1.27)
GFR calc Af Amer: 101 mL/min/{1.73_m2} (ref >59)
GFR calc non Af Amer: 87 mL/min/{1.73_m2} (ref >59)
Glucose: 79 mg/dL (ref 65–99)
Potassium: 3.9 mmol/L (ref 3.5–5.2)
Sodium: 139 mmol/L (ref 134–144)

## 2020-08-06 NOTE — Telephone Encounter (Signed)
Pt contacted pre-catheterization scheduled at [facility] for: Friday August 07, 2020 10:30 AM Verified arrival time and place: [facility]) at: 8:30 AM   No solid food after midnight prior to cath, clear liquids until 5 AM day of procedure.  Hold: Eliquis-none 08/05/20 until post procedure HCTZ/KCl-AM of procedure  Except hold medications AM meds can be  taken pre-cath with sips of water including: ASA 81 mg   Confirmed patient has responsible adult to drive home post procedure and observe 24 hours after arriving home: yes  You are allowed ONE visitor in the waiting room during the time you are at the hospital for your procedure. Both you and your visitor must wear a mask once you enter the hospital.       COVID-19 Pre-Screening Questions:   In the past 10 days have you had a new cough, shortness of breath, headache, congestion, fever (100 or greater) unexplained body aches, new sore throat, or sudden loss of taste or sense of smell? no  In the past 10 days have you been around anyone with known Covid 19? no  Have you been vaccinated for COVID-19? Yes, see immunization history  Reviewed procedure/mask/visitor instructions, COVID-19 questions with patient.

## 2020-08-07 ENCOUNTER — Other Ambulatory Visit: Payer: Self-pay

## 2020-08-07 ENCOUNTER — Encounter (HOSPITAL_COMMUNITY)
Admission: RE | Disposition: A | Discharge status: Home / Self Care | Payer: Medicare Other | Source: Home / Self Care | Attending: Cardiology

## 2020-08-07 ENCOUNTER — Ambulatory Visit (HOSPITAL_COMMUNITY)
Admission: RE | Admit: 2020-08-07 | Discharge: 2020-08-07 | Disposition: A | Discharge status: Home / Self Care | Payer: Medicare Other | Attending: Cardiology | Admitting: Cardiology

## 2020-08-07 DIAGNOSIS — Z79899 Other long term (current) drug therapy: Secondary | ICD-10-CM | POA: Diagnosis not present

## 2020-08-07 DIAGNOSIS — I4819 Other persistent atrial fibrillation: Secondary | ICD-10-CM | POA: Insufficient documentation

## 2020-08-07 DIAGNOSIS — Z8249 Family history of ischemic heart disease and other diseases of the circulatory system: Secondary | ICD-10-CM | POA: Diagnosis not present

## 2020-08-07 DIAGNOSIS — F419 Anxiety disorder, unspecified: Secondary | ICD-10-CM | POA: Diagnosis not present

## 2020-08-07 DIAGNOSIS — Z87891 Personal history of nicotine dependence: Secondary | ICD-10-CM | POA: Diagnosis not present

## 2020-08-07 DIAGNOSIS — I25118 Atherosclerotic heart disease of native coronary artery with other forms of angina pectoris: Secondary | ICD-10-CM | POA: Insufficient documentation

## 2020-08-07 DIAGNOSIS — K219 Gastro-esophageal reflux disease without esophagitis: Secondary | ICD-10-CM | POA: Diagnosis not present

## 2020-08-07 DIAGNOSIS — I700 Atherosclerosis of aorta: Secondary | ICD-10-CM | POA: Insufficient documentation

## 2020-08-07 DIAGNOSIS — I10 Essential (primary) hypertension: Secondary | ICD-10-CM | POA: Insufficient documentation

## 2020-08-07 DIAGNOSIS — F4310 Post-traumatic stress disorder, unspecified: Secondary | ICD-10-CM | POA: Insufficient documentation

## 2020-08-07 DIAGNOSIS — I2510 Atherosclerotic heart disease of native coronary artery without angina pectoris: Secondary | ICD-10-CM

## 2020-08-07 DIAGNOSIS — F329 Major depressive disorder, single episode, unspecified: Secondary | ICD-10-CM | POA: Diagnosis not present

## 2020-08-07 DIAGNOSIS — M109 Gout, unspecified: Secondary | ICD-10-CM | POA: Diagnosis not present

## 2020-08-07 DIAGNOSIS — E785 Hyperlipidemia, unspecified: Secondary | ICD-10-CM | POA: Diagnosis not present

## 2020-08-07 HISTORY — PX: LEFT HEART CATH AND CORONARY ANGIOGRAPHY: CATH118249

## 2020-08-07 SURGERY — LEFT HEART CATH AND CORONARY ANGIOGRAPHY
Anesthesia: LOCAL

## 2020-08-07 MED ORDER — HEPARIN (PORCINE) IN NACL 1000-0.9 UT/500ML-% IV SOLN
INTRAVENOUS | Status: AC
  Filled 2020-08-07: qty 500

## 2020-08-07 MED ORDER — SODIUM CHLORIDE 0.9 % IV SOLN: 250.0000 mL | INTRAVENOUS | Status: DC | PRN

## 2020-08-07 MED ORDER — HEPARIN SODIUM (PORCINE) 1000 UNIT/ML IJ SOLN
INTRAMUSCULAR | Status: DC | PRN
  Administered 2020-08-07: 4000 [IU] via INTRAVENOUS

## 2020-08-07 MED ORDER — LIDOCAINE HCL (PF) 1 % IJ SOLN
INTRAMUSCULAR | Status: DC | PRN
  Administered 2020-08-07: 2 mL

## 2020-08-07 MED ORDER — FENTANYL CITRATE (PF) 100 MCG/2ML IJ SOLN
INTRAMUSCULAR | Status: DC | PRN
  Administered 2020-08-07: 25 ug via INTRAVENOUS

## 2020-08-07 MED ORDER — VERAPAMIL HCL 2.5 MG/ML IV SOLN
INTRAVENOUS | Status: DC | PRN
  Administered 2020-08-07: 10 mL via INTRA_ARTERIAL

## 2020-08-07 MED ORDER — SODIUM CHLORIDE 0.9 % WEIGHT BASED INFUSION: 1.0000 mL/kg/h | INTRAVENOUS | Status: DC

## 2020-08-07 MED ORDER — HEPARIN (PORCINE) IN NACL 1000-0.9 UT/500ML-% IV SOLN
INTRAVENOUS | Status: DC | PRN
  Administered 2020-08-07 (×3): 500 mL

## 2020-08-07 MED ORDER — ACETAMINOPHEN 325 MG PO TABS: 650.0000 mg | ORAL_TABLET | ORAL | Status: DC | PRN

## 2020-08-07 MED ORDER — IOHEXOL 350 MG/ML SOLN
INTRAVENOUS | Status: DC | PRN
  Administered 2020-08-07: 55 mL via INTRA_ARTERIAL

## 2020-08-07 MED ORDER — HEPARIN SODIUM (PORCINE) 1000 UNIT/ML IJ SOLN
INTRAMUSCULAR | Status: AC
  Filled 2020-08-07: qty 1

## 2020-08-07 MED ORDER — MIDAZOLAM HCL 2 MG/2ML IJ SOLN
INTRAMUSCULAR | Status: DC | PRN
  Administered 2020-08-07: 1 mg via INTRAVENOUS

## 2020-08-07 MED ORDER — SODIUM CHLORIDE 0.9% FLUSH: 3.0000 mL | Freq: Two times a day (BID) | INTRAVENOUS | Status: DC

## 2020-08-07 MED ORDER — SODIUM CHLORIDE 0.9 % WEIGHT BASED INFUSION
3.0000 mL/kg/h | INTRAVENOUS | Status: AC
  Administered 2020-08-07: 3 mL/kg/h via INTRAVENOUS

## 2020-08-07 MED ORDER — ASPIRIN 81 MG PO CHEW: 81.0000 mg | CHEWABLE_TABLET | ORAL | Status: DC

## 2020-08-07 MED ORDER — LIDOCAINE HCL (PF) 1 % IJ SOLN
INTRAMUSCULAR | Status: AC
  Filled 2020-08-07: qty 30

## 2020-08-07 MED ORDER — ONDANSETRON HCL 4 MG/2ML IJ SOLN: 4.0000 mg | Freq: Four times a day (QID) | INTRAMUSCULAR | Status: DC | PRN

## 2020-08-07 MED ORDER — HYDRALAZINE HCL 20 MG/ML IJ SOLN: 10.0000 mg | INTRAMUSCULAR | Status: DC | PRN

## 2020-08-07 MED ORDER — FENTANYL CITRATE (PF) 100 MCG/2ML IJ SOLN
INTRAMUSCULAR | Status: AC
  Filled 2020-08-07: qty 2

## 2020-08-07 MED ORDER — VERAPAMIL HCL 2.5 MG/ML IV SOLN
INTRAVENOUS | Status: AC
  Filled 2020-08-07: qty 2

## 2020-08-07 MED ORDER — MIDAZOLAM HCL 2 MG/2ML IJ SOLN
INTRAMUSCULAR | Status: AC
  Filled 2020-08-07: qty 2

## 2020-08-07 MED ORDER — SODIUM CHLORIDE 0.9% FLUSH: 3.0000 mL | INTRAVENOUS | Status: DC | PRN

## 2020-08-07 MED ORDER — HEPARIN (PORCINE) IN NACL 1000-0.9 UT/500ML-% IV SOLN
INTRAVENOUS | Status: AC
  Filled 2020-08-07: qty 1000

## 2020-08-07 SURGICAL SUPPLY — 9 items

## 2020-08-07 NOTE — Progress Notes (Signed)
[facility] consulted for outpatient CABG/MAZE evaluation. [facility] office will call pt with an appointment.

## 2020-08-07 NOTE — Discharge Instructions (Signed)
Radial Site Care  This sheet gives you information about how to care for yourself after your procedure. Your health care provider may also give you more specific instructions. If you have problems or questions, contact your health care provider. What can I expect after the procedure? After the procedure, it is common to have:  Bruising and tenderness at the catheter insertion area. Follow these instructions at home: Medicines  Take over-the-counter and prescription medicines only as told by your health care provider. Insertion site care  Follow instructions from your health care provider about how to take care of your insertion site. Make sure you: ? Wash your hands with soap and water before you change your bandage (dressing). If soap and water are not available, use hand sanitizer. ? Change your dressing as told by your health care provider. ? Leave stitches (sutures), skin glue, or adhesive strips in place. These skin closures may need to stay in place for 2 weeks or longer. If adhesive strip edges start to loosen and curl up, you may trim the loose edges. Do not remove adhesive strips completely unless your health care provider tells you to do that.  Check your insertion site every day for signs of infection. Check for: ? Redness, swelling, or pain. ? Fluid or blood. ? Pus or a bad smell. ? Warmth.  Do not take baths, swim, or use a hot tub until your health care provider approves.  You may shower 24-48 hours after the procedure, or as directed by your health care provider. ? Remove the dressing and gently wash the site with plain soap and water. ? Pat the area dry with a clean towel. ? Do not rub the site. That could cause bleeding.  Do not apply powder or lotion to the site. Activity   For 24 hours after the procedure, or as directed by your health care provider: ? Do not flex or bend the affected arm. ? Do not push or pull heavy objects with the affected arm. ? Do not  drive yourself home from the hospital or clinic. You may drive 24 hours after the procedure unless your health care provider tells you not to. ? Do not operate machinery or power tools.  Do not lift anything that is heavier than 10 lb (4.5 kg), or the limit that you are told, until your health care provider says that it is safe.  Ask your health care provider when it is okay to: ? Return to work or school. ? Resume usual physical activities or sports. ? Resume sexual activity. General instructions  If the catheter site starts to bleed, raise your arm and put firm pressure on the site. If the bleeding does not stop, get help right away. This is a medical emergency.  If you went home on the same day as your procedure, a responsible adult should be with you for the first 24 hours after you arrive home.  Keep all follow-up visits as told by your health care provider. This is important. Contact a health care provider if:  You have a fever.  You have redness, swelling, or yellow drainage around your insertion site. Get help right away if:  You have unusual pain at the radial site.  The catheter insertion area swells very fast.  The insertion area is bleeding, and the bleeding does not stop when you hold steady pressure on the area.  Your arm or hand becomes pale, cool, tingly, or numb. These symptoms may represent a serious problem   that is an emergency. Do not wait to see if the symptoms will go away. Get medical help right away. Call your local emergency services (911 in the [place] Do not drive yourself to the hospital. Summary  After the procedure, it is common to have bruising and tenderness at the site.  Follow instructions from your health care provider about how to take care of your radial site wound. Check the wound every day for signs of infection.  Do not lift anything that is heavier than 10 lb (4.5 kg), or the limit that you are told, until your health care provider says  that it is safe. This information is not intended to replace advice given to you by your health care provider. Make sure you discuss any questions you have with your health care provider. Document Revised: 01/03/2018 Document Reviewed: 01/03/2018 Elsevier Patient Education  2020 [employer]  

## 2020-08-07 NOTE — Interval H&P Note (Signed)
History and Physical Interval Note:  08/07/2020 11:11 AM  Joseph Larsen  has presented today for surgery, with the diagnosis of Abnormal Coronary CTA.  The various methods of treatment have been discussed with the patient and family. After consideration of risks, benefits and other options for treatment, the patient has consented to  Procedure(s): LEFT HEART CATH AND CORONARY ANGIOGRAPHY (N/A) as a surgical intervention.  The patient's history has been reviewed, patient examined, no change in status, stable for surgery.  I have reviewed the patient's chart and labs.  Questions were answered to the patient's satisfaction.   Cath Lab Visit (complete for each Cath Lab visit)  Clinical Evaluation Leading to the Procedure:   ACS: No.  Non-ACS:    Anginal Classification: CCS I  Anti-ischemic medical therapy: Minimal Therapy (1 class of medications)  Non-Invasive Test Results: Intermediate-risk stress test findings: cardiac mortality 1-3%/year  Prior CABG: No previous CABG        Joseph Larsen [facility] 08/07/2020 11:12 AM

## 2020-08-10 ENCOUNTER — Encounter (HOSPITAL_COMMUNITY): Payer: Self-pay | Admitting: Cardiology

## 2020-08-10 NOTE — Progress Notes (Signed)
Make sure he has CVTS referral

## 2020-08-12 ENCOUNTER — Encounter: Payer: Self-pay | Admitting: *Deleted

## 2020-08-12 ENCOUNTER — Other Ambulatory Visit: Payer: Self-pay | Admitting: *Deleted

## 2020-08-12 ENCOUNTER — Institutional Professional Consult (permissible substitution) (INDEPENDENT_AMBULATORY_CARE_PROVIDER_SITE_OTHER): Payer: Medicare Other | Admitting: Surgery

## 2020-08-12 ENCOUNTER — Other Ambulatory Visit: Payer: Self-pay

## 2020-08-12 ENCOUNTER — Encounter: Payer: Self-pay | Admitting: Surgery

## 2020-08-12 VITALS — BP 170/90 | HR 55 | Temp 97.7°F | Resp 20 | Ht 71.0 in | Wt 174.0 lb

## 2020-08-12 DIAGNOSIS — I2510 Atherosclerotic heart disease of native coronary artery without angina pectoris: Secondary | ICD-10-CM | POA: Diagnosis not present

## 2020-08-12 DIAGNOSIS — I4891 Unspecified atrial fibrillation: Secondary | ICD-10-CM | POA: Diagnosis not present

## 2020-08-12 NOTE — Progress Notes (Deleted)
Cardiology Office Note:    Date:  08/12/2020   ID:  Joseph Larsen, DOB Oct 20, 1946, MRN [id number]  PCP:  [name], DO  Cardiologist:  Jenkins Rouge, MD   Electrophysiologist:  None   Referring MD: [name], DO   Chief Complaint:  No chief complaint on file.    Patient Profile:    Joseph Larsen is a 74 y.o. male with:   Paroxysmal >> persistent atrial fibrillation ? S/p DCCV 05/15/20 >> ERAF ? Flecainide started 6/21 ? ETT-Myoview - no ischemia; no VT ? DCCV 06/01/20 >> NSR  Hypertension  Hyperlipidemia  Alcohol abuse  Chest pain ? Myoview 6/19: No ischemia ? Myoview 6/21: Small inferoapical infarct, no ischemia, low risk  Echo 6/19: EF 60-65  Echo 5/21: EF 55-60, LAE, LVH   Prior CV studies: Coronary CTA 07/21/20 Ca score 3159 (95th percentile) RCA prox 25-49, mid 50-69, dist 50-69 LM dist 24-49 LAD prox > 70, mid 50-69 LCx ost 1-24, mid 25-49 No acute extracardiac findings Aortic atherosclerosis Trace bilateral pleural fluid  FFR 1. Left Main:  No significant stenosis. FFR = 0.89 2. LAD: Significant stenosis. FFR 0.76 at takeoff of first diagonal, Mid FFR = 0.66, Distal FFR = 0.58 3. LCX: No significant stenosis. Proximal FFR = 0.86 4. RCA: Unable to be interpreted due to artifact. IMPRESSION: 1. CT FFR analysis showed significant stenosis in the LAD as noted, beginning near the takeoff of the first significant diagonal and extending distally.     ETT-Myoview 05/29/2020 Small inferior apical wall infarct, no ischemia, not gated due to atrial fibrillation, low risk ETT portion normal  Event monitor 05/04/2020 Atrial fibrillation, average heart rate 81, 3.1-second pause, <1% PVCs  Echocardiogram 04/23/2020 EF 55-60, no RWMA, mild LVH, normal RV SF, severe LAE, mildly dilated aortic root (39 mm), dilated ascending aorta (40 mm)  History of Present Illness:    74 y.o. f/u post [facility] and cath for CAD   He was maintaining normal  sinus rhythm on Flecainide after his most recent DCCV 06/01/20 .  His exercise Myoview  05/29/20 suggested possible inferior scar and a Coronary CTA was arranged to rule out significant CAD as he is on a Class 1c antiarrhythmic.  His CT demonstrated potentially hemodynamically significant stenosis in the LAD by FFR.  There was significant artifact in the RCA and not interpretable. Flecainide was stopped and cath arranged Diuretic and eliquis held. Cath by Dr Martinique done 08/07/20 showed severe proximal/mid LAD and ostial to proximal circumflex disease Recommended CABG with MAZE   ***  Past Medical History:  Diagnosis Date   Abnormal ECG    a. 08/2012 Abnl ETT with ST changes in recovery;  b. 08/2012 Ex MV, ex time 10:00, EF 66%, no ischemia/infarct.   Allergic rhinitis    Anxiety    Cataract    Depression    Diverticular disease    Dyspnea on exertion    ETOH abuse    GERD (gastroesophageal reflux disease)    Gout    Hemorrhoids    Hiatal hernia    HTN (hypertension)    Low testosterone    MACROCYTIC ANEMIA    NONSPEC ELEVATION OF LEVELS OF TRANSAMINASE/LDH    Nonspecific abnormal finding in stool contents    Palpitations    PTSD (post-traumatic stress disorder)    PTSD (post-traumatic stress disorder)    [name]    Current Medications: No outpatient medications have been marked as taking for  the 08/19/20 encounter (Appointment) with Joseph Hector, MD.     Allergies:   Terazosin, Enalapril maleate, Lisinopril, and Penicillins   Social History   Tobacco Use   Smoking status: Former Smoker    Packs/day: 3.00    Types: Cigarettes    Start date: 02/09/1965    Quit date: 05/12/1982    Years since quitting: 38.2   Smokeless tobacco: Never Used  Vaping Use   Vaping Use: Never used  Substance Use Topics   Alcohol use: Yes    Alcohol/week: 14.0 standard drinks    Types: 14 Cans of beer per week    Comment: drinks 2-3 cans of beer/day     Drug use:  No    Comment: 1 cup of decaf coffee/day.     Family Hx: The patient's family history includes Heart disease in his father and mother; Heart murmur in his sister; Hypertension in his brother, sister, sister, sister, and sister; Liver disease in his brother. There is no history of Colon cancer.  Review of Systems  Respiratory: Negative for hemoptysis.   Gastrointestinal: Negative for hematemesis, hematochezia and melena.  Genitourinary: Negative for hematuria.  All other systems reviewed and are negative.    EKGs/Labs/Other Test Reviewed:    EKG: 08/05/20 sinus brady, HR 49, normal axis, non-specific ST-TW changes, QTc 415  Recent Labs: 04/13/2020: ALT 31; TSH 2.560 05/18/2020: Magnesium 1.7 08/05/2020: BUN 9; Creatinine, Ser 0.83; Hemoglobin 15.8; Platelets 171; Potassium 3.9; Sodium 139   Recent Lipid Panel Lab Results  Component Value Date/Time   CHOL 98 (L) 04/13/2020 08:39 AM   TRIG 68 04/13/2020 08:39 AM   TRIG 82 03/25/2015 09:50 AM   HDL 28 (L) 04/13/2020 08:39 AM   HDL 38 (L) 03/25/2015 09:50 AM   CHOLHDL 3.5 04/13/2020 08:39 AM   LDLCALC 55 04/13/2020 08:39 AM   LDLCALC 54 07/17/2014 08:28 AM   LDLDIRECT 74 01/03/2017 11:00 AM    Physical Exam:    VS:  There were no vitals taken for this visit.    Affect appropriate Healthy:  appears stated age 42: normal Neck supple with no adenopathy JVP normal no bruits no thyromegaly Lungs clear with no wheezing and good diaphragmatic motion Heart:  S1/S2 no murmur, no rub, gallop or click PMI normal Abdomen: benighn, BS positve, no tenderness, no AAA no bruit.  No HSM or HJR Distal pulses intact with no bruits No edema Neuro non-focal Skin warm and dry No muscular weakness     ASSESSMENT & PLAN:    1. 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 Persistent atrial fibrillation (HCC) -post DCC x 2 Flecainide D/C due to CAD for MAZE with surgery   3. Essential hypertension - Well  controlled.  Continue current medications and low sodium Dash type diet.      F/U post CABG/MAZE   Medication Adjustments/Labs and Tests Ordered: Current medicines are reviewed at length with the patient today.  Concerns regarding medicines are outlined above.  Tests Ordered: No orders of the defined types were placed in this encounter.  Medication Changes: No orders of the defined types were placed in this encounter.   Signed, Jenkins Rouge, MD  08/12/2020 5:37 PM    [facility] [place], [place], [place]  [address] Phone: [phone]; Fax: [phone]

## 2020-08-13 NOTE — Progress Notes (Signed)
Cardiothoracic Surgery Consultation   PCP is [name], DO Referring Provider is Martinique, Peter M, MD  Chief Complaint  Patient presents with  . Coronary Artery Disease    Surgical consult, Cardiac Cath  08/07/20, ECHO 04/23/20, Cardioversion 06/01/20  . Atrial Fibrillation    HPI:  The patient is a 74 year old gentleman with history of hypertension, hyperlipidemia, paroxysmal and now persistent atrial fibrillation who was maintaining normal sinus rhythm on flecainide after his most recent cardioversion.  He had an exercise Myoview suggesting possible inferior scar and a coronary CTA showed a calcium score of 3159 with calcified plaque most prominently in the distal left main and proximal LAD as well as the proximal to mid RCA.  FFR of the left main was 0.89 suggesting no significant stenosis.  FFR in the LAD was 0.76 at the origin of the first diagonal and 0.66 in the midportion.  Distally it was 0.58 suggesting significant LAD stenosis.  The FFR of left circumflex was 0.86.  It was not possible to interpret the RCA data due to artifact.  He subsequently underwent cardiac catheterization on 08/07/2020 which showed 50% calcified left main stenosis.  The proximal to mid LAD had 90% stenosis.  The ramus had 90% stenosis.  The ostial to proximal left circumflex had 70% stenosis.  RCA had mild nonobstructive disease.  Left ventricular systolic function was normal with normal LVEDP.  The patient is here today with his wife.  He retired as a Administrator due to disability.  He has remained active walking 2 miles per day until his recent diagnosis by catheterization.  He denies any chest pain or pressure.  He denies any shortness of breath, dizziness, syncope, or leg swelling.   Past Medical History:  Diagnosis Date  . Abnormal ECG    a. 08/2012 Abnl ETT with ST changes in recovery;  b. 08/2012 Ex MV, ex time 10:00, EF 66%, no ischemia/infarct.  . Allergic rhinitis   . Anxiety   .  Cataract   . Depression   . Diverticular disease   . Dyspnea on exertion   . ETOH abuse   . GERD (gastroesophageal reflux disease)   . Gout   . Hemorrhoids   . Hiatal hernia   . HTN (hypertension)   . Low testosterone   . MACROCYTIC ANEMIA   . NONSPEC ELEVATION OF LEVELS OF TRANSAMINASE/LDH   . Nonspecific abnormal finding in stool contents   . Palpitations   . PTSD (post-traumatic stress disorder)   . PTSD (post-traumatic stress disorder)    [name]    Past Surgical History:  Procedure Laterality Date  . CARDIOVERSION N/A 05/15/2020   Procedure: CARDIOVERSION;  Surgeon: Sueanne Margarita, MD;  Location: [facility] ENDOSCOPY;  Service: Cardiovascular;  Laterality: N/A;  . CARDIOVERSION N/A 06/01/2020   Procedure: CARDIOVERSION;  Surgeon: Elouise Munroe, MD;  Location: [facility] ENDOSCOPY;  Service: Cardiovascular;  Laterality: N/A;  . CHOLECYSTECTOMY  2004  . KNEE SURGERY Bilateral   . LEFT HEART CATH AND CORONARY ANGIOGRAPHY N/A 08/07/2020   Procedure: LEFT HEART CATH AND CORONARY ANGIOGRAPHY;  Surgeon: Martinique, Peter M, MD;  Location: [facility] LAB;  Service: Cardiovascular;  Laterality: N/A;  . SHOULDER ARTHROSCOPY Right 01/14/2020    Family History  Problem Relation Age of Onset  . Heart disease Mother        "died of chf"  . Heart disease Father        "died of old age"  .  Hypertension Sister   . Liver disease Brother   . Hypertension Brother   . Hypertension Sister   . Hypertension Sister   . Hypertension Sister   . Heart murmur Sister   . Colon cancer Neg Hx     Social History Social History   Tobacco Use  . Smoking status: Former Smoker    Packs/day: 3.00    Types: Cigarettes    Start date: 02/09/1965    Quit date: 05/12/1982    Years since quitting: 38.2  . Smokeless tobacco: Never Used  Vaping Use  . Vaping Use: Never used  Substance Use Topics  . Alcohol use: Yes    Alcohol/week: 14.0 standard drinks    Types: 14 Cans of beer per week    Comment:  drinks 2-3 cans of beer/day    . Drug use: No    Comment: 1 cup of decaf coffee/day.    Current Outpatient Medications  Medication Sig Dispense Refill  . acetaminophen (TYLENOL) 500 MG tablet Take 1,000 mg by mouth daily as needed for moderate pain or headache.    . allopurinol (ZYLOPRIM) 300 MG tablet Take 150 mg by mouth daily.    Marland Kitchen ALPRAZolam (XANAX) 0.5 MG tablet Take 0.25 mg by mouth at bedtime.     Marland Kitchen atenolol (TENORMIN) 25 MG tablet Take 0.5 tablets (12.5 mg total) by mouth daily. 60 tablet 3  . Cholecalciferol (VITAMIN D) 50 MCG (2000 UT) tablet Take 2,000 Units by mouth daily.    . Cyanocobalamin 1000 MCG/ML KIT Inject 1,000 mcg as directed every 30 (thirty) days.    . fluticasone (FLONASE) 50 MCG/ACT nasal spray Place 2 sprays into both nostrils 2 (two) times daily as needed for allergies or rhinitis.    . hydrochlorothiazide (HYDRODIURIL) 25 MG tablet Take 25 mg by mouth daily.     Marland Kitchen losartan (COZAAR) 100 MG tablet Take 50 mg by mouth daily.    . Misc Natural Products (OSTEO BI-FLEX ADV TRIPLE ST) TABS Take 1 tablet by mouth daily.    . nitroGLYCERIN (NITROSTAT) 0.4 MG SL tablet Place 1 tablet (0.4 mg total) under the tongue every 5 (five) minutes as needed for chest pain. 30 tablet 3  . Omega-3 Fatty Acids (FISH OIL) 1000 MG CAPS Take 2,000 mg by mouth 2 (two) times daily.     Marland Kitchen omeprazole (PRILOSEC) 20 MG capsule Take 20 mg by mouth 2 (two) times daily.     . potassium chloride SA (KLOR-CON) 20 MEQ tablet Take 1 tablet (20 mEq total) by mouth 2 (two) times daily. 60 tablet 3  . QUEtiapine (SEROQUEL) 100 MG tablet Take 25 mg by mouth at bedtime. Pt only takes 1/4 tab     . tamsulosin (FLOMAX) 0.4 MG CAPS capsule Take 0.4 mg by mouth daily after breakfast.     No current facility-administered medications for this visit.    Allergies  Allergen Reactions  . Terazosin Rash    Rash when in sun  . Enalapril Maleate Rash  . Lisinopril Rash and Itching  . Penicillins Itching and  Rash    35 years ago     Review of Systems  Constitutional: Negative for activity change and fatigue.  HENT: Negative.   Eyes: Negative.   Respiratory: Negative.  Negative for chest tightness and shortness of breath.   Cardiovascular: Negative for chest pain, palpitations and leg swelling.  Gastrointestinal: Negative.   Endocrine: Negative.   Genitourinary: Negative.   Musculoskeletal: Negative.   Allergic/Immunologic: Negative.  Neurological: Negative for dizziness and syncope.  Hematological: Negative.   Psychiatric/Behavioral:       History of PTSD    BP (!) 170/90   Pulse (!) 55   Temp 97.7 F (36.5 C) (Skin)   Resp 20   Ht 5' 11"  (1.803 m)   Wt 174 lb (78.9 kg)   SpO2 97% Comment: RA  BMI 24.27 kg/m  Physical Exam Constitutional:      Appearance: Normal appearance. He is normal weight.  HENT:     Head: Normocephalic and atraumatic.  Eyes:     Extraocular Movements: Extraocular movements intact.     Conjunctiva/sclera: Conjunctivae normal.     Pupils: Pupils are equal, round, and reactive to light.  Neck:     Vascular: No carotid bruit.  Cardiovascular:     Rate and Rhythm: Normal rate and regular rhythm.     Pulses: Normal pulses.     Heart sounds: Normal heart sounds. No murmur heard.   Pulmonary:     Effort: Pulmonary effort is normal.     Breath sounds: Normal breath sounds.  Abdominal:     General: Abdomen is flat.     Palpations: Abdomen is soft.     Tenderness: There is no abdominal tenderness.  Musculoskeletal:        General: No swelling.     Cervical back: Normal range of motion and neck supple.  Lymphadenopathy:     Cervical: No cervical adenopathy.  Skin:    General: Skin is dry.  Neurological:     General: No focal deficit present.     Mental Status: He is alert and oriented to person, place, and time.  Psychiatric:        Mood and Affect: Mood normal.        Behavior: Behavior normal.      Diagnostic Tests:  ECHOCARDIOGRAM  REPORT       Patient Name:  DARON STUTZ Date of Exam: 04/23/2020  Medical Rec #: [id number]   Height:    71.0 in  Accession #:  [id number]  Weight:    189.0 lb  Date of Birth: 1946/06/08   BSA:     2.058 m  Patient Age:  19 years   BP:      138/88 mmHg  Patient Gender: M       HR:      69 bpm.  Exam Location: [place]   Procedure: 2D Echo, 3D Echo, Cardiac Doppler and Color Doppler   Indications:  I48 Atrial fibrillation    History:    Patient has prior history of Echocardiogram examinations,  most         recent 05/15/2018. Arrythmias:Atrial Fibrillation and  Palpitation,         Signs/Symptoms:Shortness of Breath; Risk  Factors:Hypertension         and Former Smoker. ETOH.    Sonographer:  Jessee Avers, RDCS  Referring Phys: [place]    1. Left ventricular ejection fraction, by estimation, is 55 to 60%. The  left ventricle has normal function. The left ventricle has no regional  wall motion abnormalities. There is mild concentric left ventricular  hypertrophy. Left ventricular diastolic  parameters are indeterminate.  2. Right ventricular systolic function is normal. The right ventricular  size is normal. There is normal pulmonary artery systolic pressure.  3. Left atrial size was severely dilated.  4. The mitral valve is normal in structure. No evidence of  mitral valve  regurgitation. No evidence of mitral stenosis.  5. The aortic valve is normal in structure. Aortic valve regurgitation is  not visualized. No aortic stenosis is present.  6. Aortic dilatation noted. Aneurysm of the ascending aorta, measuring 40  mm. There is mild dilatation of the aortic root measuring 39 mm.  7. The inferior vena cava is dilated in size with <50% respiratory  variability, suggesting right atrial pressure of 15 mmHg.   Comparison(s): 05/15/18 EF 60-65%.   FINDINGS   Left Ventricle: Left ventricular ejection fraction, by estimation, is 55  to 60%. The left ventricle has normal function. The left ventricle has no  regional wall motion abnormalities. The left ventricular internal cavity  size was normal in size. There is  mild concentric left ventricular hypertrophy. Left ventricular diastolic  parameters are indeterminate.   Right Ventricle: The right ventricular size is normal. No increase in  right ventricular wall thickness. Right ventricular systolic function is  normal. There is normal pulmonary artery systolic pressure. The tricuspid  regurgitant velocity is 2.42 m/s, and  with an assumed right atrial pressure of 3 mmHg, the estimated right  ventricular systolic pressure is 37.1 mmHg.   Left Atrium: Left atrial size was severely dilated.   Right Atrium: Right atrial size was normal in size.   Pericardium: There is no evidence of pericardial effusion.   Mitral Valve: The mitral valve is normal in structure. Normal mobility of  the mitral valve leaflets. No evidence of mitral valve regurgitation. No  evidence of mitral valve stenosis.   Tricuspid Valve: The tricuspid valve is normal in structure. Tricuspid  valve regurgitation is mild . No evidence of tricuspid stenosis.   Aortic Valve: The aortic valve is normal in structure.. There is mild  thickening and mild calcification of the aortic valve. Aortic valve  regurgitation is not visualized. No aortic stenosis is present. There is  mild thickening of the aortic valve. There  is mild calcification of the aortic valve.   Pulmonic Valve: The pulmonic valve was normal in structure. Pulmonic valve  regurgitation is trivial. No evidence of pulmonic stenosis.   Aorta: Aortic dilatation noted. 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   Venous: The inferior vena cava is dilated in size  with less than 50%  respiratory variability, suggesting right atrial pressure of 15 mmHg.   IAS/Shunts: No atrial level shunt detected by color flow Doppler.     LEFT VENTRICLE  PLAX 2D  LVIDd:     4.70 cm  LVIDs:     3.20 cm  LV PW:     1.30 cm  LV IVS:    1.30 cm  LVOT diam:   2.30 cm 3D Volume EF:  LV SV:     56    3D EF:    53 %  LV SV Index:  27    LV EDV:    126 ml  LVOT Area:   4.15 cm LV ESV:    60 ml             LV SV:    66 ml   RIGHT VENTRICLE  RV Basal diam: 3.90 cm  RV S prime:   6.58 cm/s  TAPSE (M-mode): 1.8 cm  RVSP:      26.4 mmHg   LEFT ATRIUM       Index  RIGHT ATRIUM      Index  LA diam:    4.50 cm 2.19 cm/m RA Pressure: 3.00 mmHg  LA Vol (A2C):  89.2 ml 43.33 ml/m RA Area:   20.50 cm  LA Vol (A4C):  98.1 ml 47.66 ml/m RA Volume:  57.50 ml 27.93 ml/m  LA Biplane Vol: 94.9 ml 46.10 ml/m  AORTIC VALVE  LVOT Vmax:  65.60 cm/s  LVOT Vmean: 45.400 cm/s  LVOT VTI:  0.134 m    AORTA  Ao Root diam: 3.90 cm  Ao Asc diam: 4.00 cm   TRICUSPID VALVE  TR Peak grad:  23.4 mmHg  TR Vmax:    242.00 cm/s  Estimated RAP: 3.00 mmHg  RVSP:      26.4 mmHg    SHUNTS  Systemic VTI: 0.13 m  Systemic Diam: 2.30 cm   Skeet Latch MD  Electronically signed by Skeet Latch MD  Signature Date/Time: 04/23/2020/12:52:08 PM     Physicians  Panel Physicians Referring Physician Case Authorizing Physician  Martinique, Peter M, MD (Primary)    Procedures  LEFT HEART CATH AND CORONARY ANGIOGRAPHY  Conclusion    Mid LM to Dist LM lesion is 50% stenosed.  Prox LAD to Mid LAD lesion is 90% stenosed.  Ramus lesion is 90% stenosed.  Ost Cx to Prox Cx lesion is 70% stenosed.  Mid Cx lesion is 50% stenosed.  Prox RCA lesion is 30% stenosed.  The left ventricular systolic function is normal.  LV end diastolic pressure is normal.  The  left ventricular ejection fraction is 55-65% by visual estimate.   1. 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  2. Normal LV function 3. Normal LVEDP  Plan: given complexity of disease I would recommend CT surgery referral to consider CABG and MAZE procedure. OK to resume Eliquis tonight.   Recommendations  Antiplatelet/Anticoag Recommend to resume Apixaban, at currently prescribed dose and frequency on 08/07/2020. Concurrent antiplatelet therapy not recommended.  Indications  Coronary artery disease of native artery of native heart with stable angina pectoris ([place]) [V78.588 (ICD-10-CM)]  Procedural Details  Technical Details Indication: 74 yo WM with AFib and abnormal coronary CTA  Procedural Details: The right wrist was prepped, draped, and anesthetized with 1% lidocaine. Using the modified Seldinger technique, a 6 French slender sheath was introduced into the right radial artery. 3 mg of verapamil was administered through the sheath, weight-based unfractionated heparin was administered intravenously. Standard Judkins catheters were used for selective coronary angiography and left ventriculography. Catheter exchanges were performed over an exchange length guidewire. There were no immediate procedural complications. A TR band was used for radial hemostasis at the completion of the procedure.  The patient was transferred to the post catheterization recovery area for further monitoring. Contrast: 55 cc Estimated blood loss <50 mL.   During this procedure medications were administered to achieve and maintain moderate conscious sedation while the patient's heart rate, blood pressure, and oxygen saturation were continuously monitored and I was present face-to-face 100% of this time.  Medications (Filter: Administrations occurring from 5027 to 1208 on 08/07/20) (important) Continuous medications are totaled by the amount administered until 08/07/20 1208.  fentaNYL  (SUBLIMAZE) injection (mcg) Total dose:  25 mcg Date/Time  Rate/Dose/Volume Action  08/07/20 1135  25 mcg Given    midazolam (VERSED) injection (mg) Total dose:  1 mg Date/Time  Rate/Dose/Volume Action  08/07/20 1135  1 mg Given    Heparin (Porcine) in NaCl 1000-0.9 UT/500ML-% SOLN (mL) Total volume:  1,500 mL Date/Time  Rate/Dose/Volume Action  08/07/20 1136  500 mL Given  1136  500 mL Given  1149  500 mL Given    lidocaine (PF) (XYLOCAINE) 1 % injection (mL) Total volume:  2 mL Date/Time  Rate/Dose/Volume Action  08/07/20 1141  2 mL Given    Radial Cocktail/Verapamil only (mL) Total volume:  10 mL Date/Time  Rate/Dose/Volume Action  08/07/20 1141  10 mL Given    heparin sodium (porcine) injection (Units) Total dose:  4,000 Units Date/Time  Rate/Dose/Volume Action  08/07/20 1143  4,000 Units Given    iohexol (OMNIPAQUE) 350 MG/ML injection (mL) Total volume:  55 mL Date/Time  Rate/Dose/Volume Action  08/07/20 1156  55 mL Given    Sedation Time  Sedation Time Physician-1: 22 minutes 49 seconds  Contrast  Medication Name Total Dose  iohexol (OMNIPAQUE) 350 MG/ML injection 55 mL    Radiation/Fluoro  Fluoro time: 3 (min) DAP: 14.8 (Gycm2) Cumulative Air Kerma: 416.6 (mGy)  Complications  Complications documented before study signed (08/07/2020 06:30 PM)   No complications were associated with this study.  Documented by Martinique, Peter M, MD - 08/07/2020 12:09 PM    Coronary Findings  Diagnostic Dominance: Right Left Main  Mid LM to Dist LM lesion is 50% stenosed.  Left Anterior Descending  Prox LAD to Mid LAD lesion is 90% stenosed. The lesion is segmental with right-to-left collateral flow. The lesion is severely calcified.  Ramus Intermedius  Ramus lesion is 90% stenosed.  Left Circumflex  Ost Cx to Prox Cx lesion is 70% stenosed.  Mid Cx lesion is 50% stenosed.  Right Coronary Artery  Vessel is large. The vessel is severely calcified.  Prox RCA  lesion is 30% stenosed.  Intervention  No interventions have been documented.   ADDENDUM REPORT: 07/28/2020 16:32  HISTORY: CAD screening, 54yrCHD risk 10-20%, treadmill candidate  EXAM: Cardiac/Coronary CT  TECHNIQUE: The patient was scanned on a [employer]  PROTOCOL: A 120 kV prospective scan was triggered in the descending thoracic aorta at 111 HU's. Axial non-contrast 3 mm slices were carried out through the heart. The data set was analyzed on a dedicated work station and scored using the [employer] Gantry rotation speed was 250 msecs and collimation was 0.6 mm. Beta blockade and 0.8 mg of sl NTG was given. The 3D data set was reconstructed in 5% intervals of 35-75% of the R-R cycle. Diastolic phases were analyzed on a dedicated work station using MPR, MIP and VRT modes. The patient received 896mOMNIPAQUE IOHEXOL 350 MG/ML SOLN of contrast.  FINDINGS: Coronary calcium score: The patient's coronary artery calcium score is 3159, which places the patient in the 95 percentile.  Coronary arteries: Normal coronary origins.  Right dominance.  Right Coronary Artery: Large caliber vessel, gives rise to PDA and PLV. There is diffuse eccentric calcified plaque throughout the RCA, with maximum 25-49% stenosis in the proximal RCA but 50-69% stenosis with circumferential calcium in the mid RCA. There is 50-69% stenosis with calcified plaque in the distal RCA at the takeoff of the PDA.  Left Main Coronary Artery: Normal caliber vessel. Calcified plaque with at least 24-49% distal LM stenosis, cannot exclude higher degree given amount of calcium. There is a very small caliber ramus, appears to have minimal flow.  Left Anterior Descending Coronary Artery: Normal caliber vessel. Long portion of calcified plaque throughout proximal LAD, started at left main bifurcation. Maximum stenosis at least 50-69%, but difficult to fully visualize given large  amounts  of calcium. There is a portion of the proximal LAD, before the takeoff of D1, that has near circumferential calcified plaque with suspicion of >70% stenosis. Total length of calcified plaque nearly 40 mm. Gives rise to 2 diagonal branches.  Left Circumflex Artery: Small, nondominant vessel. There is ostial calcified plaque with 1-24% stenosis. There is focal calcified plaque in the mid LCx with 25-49% stenosis.  Aorta: Normal size, 37 mm at the mid ascending aorta (level of the PA bifurcation) measured double oblique. Scattered calcifications. No dissection.  Aortic Valve: Trivial calcifications. Trileaflet.  Other findings:  Normal pulmonary vein drainage into the left atrium.  Normal left atrial appendage without a thrombus.  Normal size of the pulmonary artery.  IMPRESSION: 1. At least moderate and possibly severe CAD, CADRADS = 3-4. CT FFR will be performed and reported separately. Calcified plaque diffusely noted, most prominent in distal left main, proximal LAD, and proximal/mid RCA.  2. Coronary calcium score of 3159. This was 95th percentile for age and sex matched control.  3. Normal coronary origin with right dominance.   Electronically Signed   By: Buford Dresser M.D.   On: 07/28/2020 16:32   Addended by Buford Dresser, MD on 07/28/2020 4:34 PM  Study Result  Addenda  ADDENDUM REPORT: 07/28/2020 16:32  HISTORY: CAD screening, 6yrCHD risk 10-20%, treadmill candidate  EXAM: Cardiac/Coronary CT  TECHNIQUE: The patient was scanned on a [employer]  PROTOCOL: A 120 kV prospective scan was triggered in the descending thoracic aorta at 111 HU's. Axial non-contrast 3 mm slices were carried out through the heart. The data set was analyzed on a dedicated work station and scored using the [place] Gantry rotation speed was 250 msecs and collimation was 0.6 mm. Beta blockade and 0.8 mg of sl NTG was  given. The 3D data set was reconstructed in 5% intervals of 35-75% of the R-R cycle. Diastolic phases were analyzed on a dedicated work station using MPR, MIP and VRT modes. The patient received 877mOMNIPAQUE IOHEXOL 350 MG/ML SOLN of contrast.  FINDINGS: Coronary calcium score: The patient's coronary artery calcium score is 3159, which places the patient in the 95 percentile.  Coronary arteries: Normal coronary origins.  Right dominance.  Right Coronary Artery: Large caliber vessel, gives rise to PDA and PLV. There is diffuse eccentric calcified plaque throughout the RCA, with maximum 25-49% stenosis in the proximal RCA but 50-69% stenosis with circumferential calcium in the mid RCA. There is 50-69% stenosis with calcified plaque in the distal RCA at the takeoff of the PDA.  Left Main Coronary Artery: Normal caliber vessel. Calcified plaque with at least 24-49% distal LM stenosis, cannot exclude higher degree given amount of calcium. There is a very small caliber ramus, appears to have minimal flow.  Left Anterior Descending Coronary Artery: Normal caliber vessel. Long portion of calcified plaque throughout proximal LAD, started at left main bifurcation. Maximum stenosis at least 50-69%, but difficult to fully visualize given large amounts of calcium. There is a portion of the proximal LAD, before the takeoff of D1, that has near circumferential calcified plaque with suspicion of >70% stenosis. Total length of calcified plaque nearly 40 mm. Gives rise to 2 diagonal branches.  Left Circumflex Artery: Small, nondominant vessel. There is ostial calcified plaque with 1-24% stenosis. There is focal calcified plaque in the mid LCx with 25-49% stenosis.  Aorta: Normal size, 37 mm at the mid ascending aorta (level of the PA bifurcation) measured double oblique.  Scattered calcifications. No dissection.  Aortic Valve: Trivial calcifications. Trileaflet.  Other  findings:  Normal pulmonary vein drainage into the left atrium.  Normal left atrial appendage without a thrombus.  Normal size of the pulmonary artery.  IMPRESSION: 1. At least moderate and possibly severe CAD, CADRADS = 3-4. CT FFR will be performed and reported separately. Calcified plaque diffusely noted, most prominent in distal left main, proximal LAD, and proximal/mid RCA.  2. Coronary calcium score of 3159. This was 95th percentile for age and sex matched control.  3. Normal coronary origin with right dominance.   Electronically Signed   By: Buford Dresser M.D.   On: 07/28/2020 16:32   Signed by Buford Dresser, MD on 07/28/2020 4:34 PM  Narrative & Impression  EXAM: OVER-READ INTERPRETATION  CT CHEST  The following report is an over-read performed by radiologist Dr. Abigail Miyamoto of [facility], [place] on 07/21/2020. This over-read does not include interpretation of cardiac or coronary anatomy or pathology. The coronary CTA interpretation by the cardiologist is attached.  COMPARISON:  Chest radiograph 01/07/2019  FINDINGS: Vascular: Aortic atherosclerosis. Tortuous thoracic aorta. No central pulmonary embolism, on this non-dedicated study.  Mediastinum/Nodes: No imaged thoracic adenopathy. Tiny hiatal hernia.  Lungs/Pleura: Trace bilateral pleural fluid.  Mild centrilobular emphysema.  Upper Abdomen: Normal imaged portions of the liver, spleen  Musculoskeletal: Remote right seventh rib fracture, incompletely imaged.  IMPRESSION: 1. No acute findings in the imaged extracardiac chest. 2. Trace bilateral pleural fluid. 3. Aortic Atherosclerosis (ICD10-I70.0) and Emphysema (ICD10-J43.9).  Electronically Signed: By: Abigail Miyamoto M.D. On: 07/21/2020 10:14    CT FFR ANALYSIS  CLINICAL DATA:  CAD screening, 61yrCHD risk 10-20%, treadmill candidate  FINDINGS: FFRct analysis was performed on the original  cardiac CT angiogram dataset. Diagrammatic representation of the FFRct analysis is provided in a separate PDF document in PACS. This dictation was created using the PDF document and an interactive 3D model of the results. 3D model is not available in the EMR/PACS. Normal FFR range is >0.80.  1. Left Main:  No significant stenosis. FFR = 0.89  2. LAD: Significant stenosis. FFR 0.76 at takeoff of first diagonal, Mid FFR = 0.66, Distal FFR = 0.58 3. LCX: No significant stenosis. Proximal FFR = 0.86 4. RCA: Unable to be interpreted due to artifact.  IMPRESSION: 1. CT FFR analysis showed significant stenosis in the LAD as noted, beginning near the takeoff of the first significant diagonal and extending distally.   Electronically Signed   By: BBuford DresserM.D.   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  Cardiac CT FFR shows significant ischemia in the LAD distribution.  He also has a history of paroxysmal to persistent atrial fibrillation but now is required flecainide to maintain sinus rhythm after cardioversion.  I agree that coronary bypass graft surgery is the best treatment event ischemia and infarction.  I also think he should have a Maze procedure to maximize the potential to maintain sinus rhythm without antiarrhythmic therapy.  I reviewed the cardiac cath images with the patient and his wife and answered their questions. I discussed the operative procedure with the patient and his wife including alternatives, benefits and risks; including but not limited to bleeding, blood transfusion, infection, stroke, myocardial infarction, graft failure, heart block requiring a permanent pacemaker, organ dysfunction, and death.  CWeston Annaunderstands and agrees to proceed.  Plan:  He has been scheduled for coronary artery bypass graft  surgery and Maze procedure on Monday, 08/24/2020.  He has been instructed to discontinue his Eliquis after the dose on 08/18/2020.   I spent 60 minutes performing this consultation and > 50% of this time was spent face to face counseling and coordinating the care of this patient's multivessel coronary disease and atrial fibrillation.  Gaye Pollack, MD [facility] [phone]

## 2020-08-19 ENCOUNTER — Ambulatory Visit: Payer: Medicare Other | Admitting: Cardiovascular Disease

## 2020-08-19 NOTE — Pre-Procedure Instructions (Signed)
[place] [place] [address] [place] [address] Phone: [phone] Fax: [phone]  [facility] [address] [place] [address] Phone: [phone] Fax: [phone]     Your procedure is scheduled on Monday September 13th.  Report to [facility] Main Entrance "A" at 5:30 A.M., and check in at the Admitting office.  Call this number if you have problems the morning of surgery:  [phone]  Call [phone] if you have any questions prior to your surgery date Monday-Friday 8am-4pm    Remember:  Do not eat or drink after midnight the night before your surgery   Take these medicines the morning of surgery with A SIP OF WATER allopurinol (ZYLOPRIM)  atenolol (TENORMIN)  omeprazole (PRILOSEC) tamsulosin (FLOMAX)  acetaminophen (TYLENOL) if needed fluticasone (FLONASE)-if needed nitroGLYCERIN (NITROSTAT) -if needed   As of today, STOP taking any Aspirin (unless otherwise instructed by your surgeon) Aleve, Naproxen, Ibuprofen, Motrin, Advil, Goody's, BC's, all herbal medications, fish oil, and all vitamins.                      Do not wear jewelry, make up, or nail polish            Do not wear lotions, powders, perfumes/colognes, or deodorant.            Do not shave 48 hours prior to surgery.  Men may shave face and neck.            Do not bring valuables to the hospital.            [facility] is not responsible for any belongings or valuables.  Do NOT Smoke (Tobacco/Vaping) or drink Alcohol 24 hours prior to your procedure If you use a CPAP at night, you may bring all equipment for your overnight stay.   Contacts, glasses, dentures or bridgework may not be worn into surgery.      For patients admitted to the hospital, discharge time will be determined by your treatment team.   Patients discharged the day of surgery will not be allowed to drive home, and someone needs to stay  with them for 24 hours.    Special instructions:   [facility]- Preparing For Surgery  Before surgery, you can play an important role. Because skin is not sterile, your skin needs to be as free of germs as possible. You can reduce the number of germs on your skin by washing with CHG (chlorahexidine gluconate) Soap before surgery.  CHG is an antiseptic cleaner which kills germs and bonds with the skin to continue killing germs even after washing.    Oral Hygiene is also important to reduce your risk of infection.  Remember - BRUSH YOUR TEETH THE MORNING OF SURGERY WITH YOUR REGULAR TOOTHPASTE  Please do not use if you have an allergy to CHG or antibacterial soaps. If your skin becomes reddened/irritated stop using the CHG.  Do not shave (including legs and underarms) for at least 48 hours prior to first CHG shower. It is OK to shave your face.  Please follow these instructions carefully.   1. Shower the NIGHT BEFORE SURGERY and the MORNING OF SURGERY with CHG Soap.   2. If you chose to wash your hair, wash your hair first as usual with your normal shampoo.  3. After you shampoo, rinse your hair and body thoroughly to remove the shampoo.  4. Use CHG as you would any other liquid soap. You can apply CHG directly to the skin and wash gently with a scrungie or a clean washcloth.   5. Apply the CHG Soap to your body ONLY FROM THE NECK DOWN.  Do not use on open wounds or open sores. Avoid contact with your eyes, ears, mouth and genitals (private parts). Wash Face and genitals (private parts)  with your normal soap.   6. Wash thoroughly, paying special attention to the area where your surgery will be performed.  7. Thoroughly rinse your body with warm water from the neck down.  8. DO NOT shower/wash with your normal soap after using and rinsing off the CHG Soap.  9. Pat yourself dry with a CLEAN TOWEL.  10. Wear CLEAN PAJAMAS to bed the night before surgery  11. Place CLEAN SHEETS on your  bed the night of your first shower and DO NOT SLEEP WITH PETS.   Day of Surgery: Wear Clean/Comfortable clothing the morning of surgery Do not apply any deodorants/lotions.   Remember to brush your teeth WITH YOUR REGULAR TOOTHPASTE.   Please read over the following fact sheets that you were given.

## 2020-08-20 ENCOUNTER — Encounter (HOSPITAL_COMMUNITY): Payer: Self-pay

## 2020-08-20 ENCOUNTER — Other Ambulatory Visit (HOSPITAL_COMMUNITY)
Admission: RE | Admit: 2020-08-20 | Discharge: 2020-08-20 | Disposition: A | Discharge status: Home / Self Care | Payer: Medicare Other | Source: Ambulatory Visit | Attending: Surgery | Admitting: Surgery

## 2020-08-20 ENCOUNTER — Ambulatory Visit (HOSPITAL_COMMUNITY)
Admission: RE | Admit: 2020-08-20 | Discharge: 2020-08-20 | Disposition: A | Discharge status: Home / Self Care | Payer: Medicare Other | Source: Ambulatory Visit | Attending: Surgery | Admitting: Surgery

## 2020-08-20 ENCOUNTER — Encounter (HOSPITAL_COMMUNITY)
Admission: RE | Admit: 2020-08-20 | Discharge: 2020-08-20 | Disposition: A | Discharge status: Home / Self Care | Payer: Medicare Other | Source: Ambulatory Visit | Attending: Surgery | Admitting: Surgery

## 2020-08-20 ENCOUNTER — Other Ambulatory Visit: Payer: Self-pay

## 2020-08-20 DIAGNOSIS — Z01818 Encounter for other preprocedural examination: Secondary | ICD-10-CM | POA: Insufficient documentation

## 2020-08-20 DIAGNOSIS — I2510 Atherosclerotic heart disease of native coronary artery without angina pectoris: Secondary | ICD-10-CM | POA: Diagnosis not present

## 2020-08-20 DIAGNOSIS — Z20822 Contact with and (suspected) exposure to covid-19: Secondary | ICD-10-CM | POA: Insufficient documentation

## 2020-08-20 DIAGNOSIS — I4891 Unspecified atrial fibrillation: Secondary | ICD-10-CM

## 2020-08-20 DIAGNOSIS — Z01812 Encounter for preprocedural laboratory examination: Secondary | ICD-10-CM | POA: Insufficient documentation

## 2020-08-20 HISTORY — DX: Atherosclerotic heart disease of native coronary artery without angina pectoris: I25.10

## 2020-08-20 HISTORY — DX: Unspecified osteoarthritis, unspecified site: M19.90

## 2020-08-20 LAB — COMPREHENSIVE METABOLIC PANEL
ALT: 22 U/L (ref 0–44)
AST: 21 U/L (ref 15–41)
Albumin: 3.9 g/dL (ref 3.5–5.0)
Alkaline Phosphatase: 48 U/L (ref 38–126)
Anion gap: 9 (ref 5–15)
BUN: 7 mg/dL — ABNORMAL LOW (ref 8–23)
CO2: 24 mmol/L (ref 22–32)
Calcium: 9.3 mg/dL (ref 8.9–10.3)
Chloride: 103 mmol/L (ref 98–111)
Creatinine, Ser: 0.67 mg/dL (ref 0.61–1.24)
GFR calc Af Amer: >60 mL/min (ref >60)
GFR calc non Af Amer: >60 mL/min (ref >60)
Glucose, Bld: 78 mg/dL (ref 70–99)
Potassium: 3.6 mmol/L (ref 3.5–5.1)
Sodium: 136 mmol/L (ref 135–145)
Total Bilirubin: 0.9 mg/dL (ref 0.3–1.2)
Total Protein: 6.7 g/dL (ref 6.5–8.1)

## 2020-08-20 LAB — URINALYSIS, ROUTINE W REFLEX MICROSCOPIC
Bilirubin Urine: NEGATIVE
Glucose, UA: NEGATIVE mg/dL
Hgb urine dipstick: NEGATIVE
Ketones, ur: NEGATIVE mg/dL
Leukocytes,Ua: NEGATIVE
Nitrite: NEGATIVE
Protein, ur: NEGATIVE mg/dL
Specific Gravity, Urine: 1.003 — ABNORMAL LOW (ref 1.005–1.030)
pH: 6 (ref 5.0–8.0)

## 2020-08-20 LAB — SURGICAL PCR SCREEN
MRSA, PCR: NEGATIVE
Staphylococcus aureus: NEGATIVE

## 2020-08-20 LAB — BLOOD GAS, ARTERIAL
Acid-Base Excess: 3.5 mmol/L — ABNORMAL HIGH (ref 0.0–2.0)
Bicarbonate: 27.4 mmol/L (ref 20.0–28.0)
Drawn by: 58793
FIO2: 21
O2 Saturation: 97.8 %
Patient temperature: 37
pCO2 arterial: 41 mmHg (ref 32.0–48.0)
pH, Arterial: 7.44 (ref 7.350–7.450)
pO2, Arterial: 88.8 mmHg (ref 83.0–108.0)

## 2020-08-20 LAB — PROTIME-INR
INR: 1 (ref 0.8–1.2)
Prothrombin Time: 13 seconds (ref 11.4–15.2)

## 2020-08-20 LAB — CBC
HCT: 47.9 % (ref 39.0–52.0)
Hemoglobin: 16.2 g/dL (ref 13.0–17.0)
MCH: 33 pg (ref 26.0–34.0)
MCHC: 33.8 g/dL (ref 30.0–36.0)
MCV: 97.6 fL (ref 80.0–100.0)
Platelets: 162 10*3/uL (ref 150–400)
RBC: 4.91 MIL/uL (ref 4.22–5.81)
RDW: 13.7 % (ref 11.5–15.5)
WBC: 6.1 10*3/uL (ref 4.0–10.5)
nRBC: 0 % (ref 0.0–0.2)

## 2020-08-20 LAB — HEMOGLOBIN A1C
Hgb A1c MFr Bld: 5 % (ref 4.8–5.6)
Mean Plasma Glucose: 96.8 mg/dL

## 2020-08-20 LAB — APTT: aPTT: 38 seconds — ABNORMAL HIGH (ref 24–36)

## 2020-08-20 LAB — TYPE AND SCREEN
ABO/RH(D): O NEG
Antibody Screen: NEGATIVE

## 2020-08-20 LAB — SARS CORONAVIRUS 2 (TAT 6-24 HRS): SARS Coronavirus 2: NEGATIVE

## 2020-08-20 NOTE — Progress Notes (Signed)
Pre CABG study completed.   See CVProc for preliminary results.   Griffin Basil, RDMS, RVT

## 2020-08-20 NOTE — Progress Notes (Signed)
PCP - Leatrice Jewels gottschalk Cardiologist -  nishan   Chest x-ray - 08/20/20 EKG - 08/05/20 Stress Test - 05/29/20 ECHO - 04/23/20 Cardiac Cath - 08/07/20  Sleep Study - na, stated >5 yrs. Ago study was negative CPAP - na  Fasting Blood Sugar - na   Blood Thinner Instructions: na Aspirin Instructions: na  ERAS Protcol -na   COVID TEST- 08/20/20   Anesthesia review: cardiac hx.  Patient denies shortness of breath, fever, cough and chest pain at PAT appointment   All instructions explained to the patient, with a verbal understanding of the material. Patient agrees to go over the instructions while at home for a better understanding. Patient also instructed to self quarantine after being tested for COVID-19. The opportunity to ask questions was provided.

## 2020-08-25 MED ORDER — INSULIN REGULAR(HUMAN) IN NACL 100-0.9 UT/100ML-% IV SOLN
INTRAVENOUS | Status: AC
  Administered 2020-08-26: 1.7 [IU]/h via INTRAVENOUS
  Filled 2020-08-25: qty 100

## 2020-08-25 MED ORDER — SODIUM CHLORIDE 0.9 % IV SOLN
INTRAVENOUS | Status: DC
  Filled 2020-08-25: qty 30

## 2020-08-25 MED ORDER — PLASMA-LYTE 148 IV SOLN
INTRAVENOUS | Status: DC
  Filled 2020-08-25: qty 2.5

## 2020-08-25 MED ORDER — DEXMEDETOMIDINE HCL IN NACL 400 MCG/100ML IV SOLN
0.1000 ug/kg/h | INTRAVENOUS | Status: AC
  Administered 2020-08-26: .5 ug/kg/h via INTRAVENOUS
  Filled 2020-08-25: qty 100

## 2020-08-25 MED ORDER — MILRINONE LACTATE IN DEXTROSE 20-5 MG/100ML-% IV SOLN
0.3000 ug/kg/min | INTRAVENOUS | Status: DC
  Filled 2020-08-25: qty 100

## 2020-08-25 MED ORDER — SODIUM CHLORIDE 0.9 % IV SOLN
750.0000 mg | INTRAVENOUS | Status: AC
  Administered 2020-08-26: 750 mg via INTRAVENOUS
  Filled 2020-08-25: qty 750

## 2020-08-25 MED ORDER — POTASSIUM CHLORIDE 2 MEQ/ML IV SOLN
80.0000 meq | INTRAVENOUS | Status: DC
  Filled 2020-08-25: qty 40

## 2020-08-25 MED ORDER — NITROGLYCERIN IN D5W 200-5 MCG/ML-% IV SOLN
2.0000 ug/min | INTRAVENOUS | Status: DC
  Filled 2020-08-25: qty 250

## 2020-08-25 MED ORDER — EPINEPHRINE HCL 5 MG/250ML IV SOLN IN NS
0.0000 ug/min | INTRAVENOUS | Status: DC
  Filled 2020-08-25: qty 250

## 2020-08-25 MED ORDER — NOREPINEPHRINE 4 MG/250ML-% IV SOLN
0.0000 ug/min | INTRAVENOUS | Status: DC
  Filled 2020-08-25: qty 250

## 2020-08-25 MED ORDER — TRANEXAMIC ACID 1000 MG/10ML IV SOLN
1.5000 mg/kg/h | INTRAVENOUS | Status: AC
  Administered 2020-08-26: 1.5 mg/kg/h via INTRAVENOUS
  Filled 2020-08-25: qty 25

## 2020-08-25 MED ORDER — TRANEXAMIC ACID (OHS) BOLUS VIA INFUSION
15.0000 mg/kg | INTRAVENOUS | Status: AC
  Administered 2020-08-26: 1198.5 mg via INTRAVENOUS
  Filled 2020-08-25: qty 1199

## 2020-08-25 MED ORDER — TRANEXAMIC ACID (OHS) PUMP PRIME SOLUTION
2.0000 mg/kg | INTRAVENOUS | Status: DC
  Filled 2020-08-25: qty 1.6

## 2020-08-25 MED ORDER — SODIUM CHLORIDE 0.9 % IV SOLN
1.5000 g | INTRAVENOUS | Status: AC
  Administered 2020-08-26: 1.5 g via INTRAVENOUS
  Filled 2020-08-25: qty 1.5

## 2020-08-25 MED ORDER — MAGNESIUM SULFATE 50 % IJ SOLN
40.0000 meq | INTRAMUSCULAR | Status: DC
  Filled 2020-08-25: qty 9.85

## 2020-08-25 MED ORDER — VANCOMYCIN HCL 1250 MG/250ML IV SOLN
1250.0000 mg | INTRAVENOUS | Status: AC
  Administered 2020-08-26: 1250 mg via INTRAVENOUS
  Filled 2020-08-25: qty 250

## 2020-08-25 MED ORDER — PHENYLEPHRINE HCL-NACL 20-0.9 MG/250ML-% IV SOLN
30.0000 ug/min | INTRAVENOUS | Status: AC
  Administered 2020-08-26: 10 ug/min via INTRAVENOUS
  Filled 2020-08-25: qty 250

## 2020-08-26 ENCOUNTER — Encounter (HOSPITAL_COMMUNITY): Payer: Self-pay | Admitting: Surgery

## 2020-08-26 ENCOUNTER — Inpatient Hospital Stay (HOSPITAL_COMMUNITY): Payer: Medicare Other | Admitting: Anesthesiology

## 2020-08-26 ENCOUNTER — Inpatient Hospital Stay (HOSPITAL_COMMUNITY): Payer: Medicare Other

## 2020-08-26 ENCOUNTER — Inpatient Hospital Stay (HOSPITAL_COMMUNITY)
Admission: RE | Disposition: A | Discharge status: Home / Self Care | Payer: Self-pay | Source: Home / Self Care | Attending: Surgery

## 2020-08-26 ENCOUNTER — Other Ambulatory Visit: Payer: Self-pay

## 2020-08-26 ENCOUNTER — Inpatient Hospital Stay (HOSPITAL_COMMUNITY)
Admission: RE | Admit: 2020-08-26 | Discharge: 2020-08-31 | DRG: 229 | Disposition: A | Discharge status: Home / Self Care | Payer: Medicare Other | Attending: Surgery | Admitting: Surgery

## 2020-08-26 ENCOUNTER — Inpatient Hospital Stay (HOSPITAL_COMMUNITY): Payer: Medicare Other | Admitting: Emergency Medicine

## 2020-08-26 DIAGNOSIS — Z419 Encounter for procedure for purposes other than remedying health state, unspecified: Secondary | ICD-10-CM

## 2020-08-26 DIAGNOSIS — Z4682 Encounter for fitting and adjustment of non-vascular catheter: Secondary | ICD-10-CM | POA: Diagnosis not present

## 2020-08-26 DIAGNOSIS — K5900 Constipation, unspecified: Secondary | ICD-10-CM | POA: Diagnosis not present

## 2020-08-26 DIAGNOSIS — K649 Unspecified hemorrhoids: Secondary | ICD-10-CM | POA: Diagnosis present

## 2020-08-26 DIAGNOSIS — E785 Hyperlipidemia, unspecified: Secondary | ICD-10-CM | POA: Diagnosis present

## 2020-08-26 DIAGNOSIS — Z79899 Other long term (current) drug therapy: Secondary | ICD-10-CM | POA: Diagnosis not present

## 2020-08-26 DIAGNOSIS — F329 Major depressive disorder, single episode, unspecified: Secondary | ICD-10-CM | POA: Diagnosis present

## 2020-08-26 DIAGNOSIS — D539 Nutritional anemia, unspecified: Secondary | ICD-10-CM | POA: Diagnosis present

## 2020-08-26 DIAGNOSIS — I2584 Coronary atherosclerosis due to calcified coronary lesion: Secondary | ICD-10-CM | POA: Diagnosis present

## 2020-08-26 DIAGNOSIS — D696 Thrombocytopenia, unspecified: Secondary | ICD-10-CM | POA: Diagnosis not present

## 2020-08-26 DIAGNOSIS — I10 Essential (primary) hypertension: Secondary | ICD-10-CM | POA: Diagnosis not present

## 2020-08-26 DIAGNOSIS — F1010 Alcohol abuse, uncomplicated: Secondary | ICD-10-CM | POA: Diagnosis present

## 2020-08-26 DIAGNOSIS — Z20822 Contact with and (suspected) exposure to covid-19: Secondary | ICD-10-CM | POA: Diagnosis present

## 2020-08-26 DIAGNOSIS — I4819 Other persistent atrial fibrillation: Secondary | ICD-10-CM | POA: Diagnosis not present

## 2020-08-26 DIAGNOSIS — K219 Gastro-esophageal reflux disease without esophagitis: Secondary | ICD-10-CM | POA: Diagnosis not present

## 2020-08-26 DIAGNOSIS — M109 Gout, unspecified: Secondary | ICD-10-CM | POA: Diagnosis not present

## 2020-08-26 DIAGNOSIS — Z87891 Personal history of nicotine dependence: Secondary | ICD-10-CM | POA: Diagnosis not present

## 2020-08-26 DIAGNOSIS — E8770 Fluid overload, unspecified: Secondary | ICD-10-CM | POA: Diagnosis not present

## 2020-08-26 DIAGNOSIS — I480 Paroxysmal atrial fibrillation: Secondary | ICD-10-CM | POA: Diagnosis not present

## 2020-08-26 DIAGNOSIS — I459 Conduction disorder, unspecified: Secondary | ICD-10-CM | POA: Diagnosis present

## 2020-08-26 DIAGNOSIS — R21 Rash and other nonspecific skin eruption: Secondary | ICD-10-CM | POA: Diagnosis not present

## 2020-08-26 DIAGNOSIS — Z8249 Family history of ischemic heart disease and other diseases of the circulatory system: Secondary | ICD-10-CM

## 2020-08-26 DIAGNOSIS — I4891 Unspecified atrial fibrillation: Secondary | ICD-10-CM

## 2020-08-26 DIAGNOSIS — J90 Pleural effusion, not elsewhere classified: Secondary | ICD-10-CM | POA: Diagnosis not present

## 2020-08-26 DIAGNOSIS — D62 Acute posthemorrhagic anemia: Secondary | ICD-10-CM | POA: Diagnosis not present

## 2020-08-26 DIAGNOSIS — F419 Anxiety disorder, unspecified: Secondary | ICD-10-CM | POA: Diagnosis present

## 2020-08-26 DIAGNOSIS — J9811 Atelectasis: Secondary | ICD-10-CM | POA: Diagnosis not present

## 2020-08-26 DIAGNOSIS — F4310 Post-traumatic stress disorder, unspecified: Secondary | ICD-10-CM | POA: Diagnosis not present

## 2020-08-26 DIAGNOSIS — Z8679 Personal history of other diseases of the circulatory system: Secondary | ICD-10-CM

## 2020-08-26 DIAGNOSIS — I088 Other rheumatic multiple valve diseases: Secondary | ICD-10-CM | POA: Diagnosis not present

## 2020-08-26 DIAGNOSIS — Z951 Presence of aortocoronary bypass graft: Secondary | ICD-10-CM

## 2020-08-26 DIAGNOSIS — I2510 Atherosclerotic heart disease of native coronary artery without angina pectoris: Secondary | ICD-10-CM | POA: Diagnosis not present

## 2020-08-26 DIAGNOSIS — I517 Cardiomegaly: Secondary | ICD-10-CM | POA: Diagnosis not present

## 2020-08-26 HISTORY — PX: CORONARY ARTERY BYPASS GRAFT: SHX141

## 2020-08-26 HISTORY — DX: Personal history of other diseases of the circulatory system: Z86.79

## 2020-08-26 HISTORY — PX: CLIPPING OF ATRIAL APPENDAGE: SHX5773

## 2020-08-26 HISTORY — PX: MAZE: SHX5063

## 2020-08-26 HISTORY — PX: TEE WITHOUT CARDIOVERSION: SHX5443

## 2020-08-26 LAB — CBC
HCT: 35.1 % — ABNORMAL LOW (ref 39.0–52.0)
HCT: 35.1 % — ABNORMAL LOW (ref 39.0–52.0)
Hemoglobin: 12 g/dL — ABNORMAL LOW (ref 13.0–17.0)
Hemoglobin: 12.2 g/dL — ABNORMAL LOW (ref 13.0–17.0)
MCH: 32.9 pg (ref 26.0–34.0)
MCH: 33.2 pg (ref 26.0–34.0)
MCHC: 34.2 g/dL (ref 30.0–36.0)
MCHC: 34.8 g/dL (ref 30.0–36.0)
MCV: 96.2 fL (ref 80.0–100.0)
MCV: 95.6 fL (ref 80.0–100.0)
Platelets: 107 10*3/uL — ABNORMAL LOW (ref 150–400)
Platelets: 116 10*3/uL — ABNORMAL LOW (ref 150–400)
RBC: 3.65 MIL/uL — ABNORMAL LOW (ref 4.22–5.81)
RBC: 3.67 MIL/uL — ABNORMAL LOW (ref 4.22–5.81)
RDW: 13.3 % (ref 11.5–15.5)
RDW: 13.5 % (ref 11.5–15.5)
WBC: 12.3 10*3/uL — ABNORMAL HIGH (ref 4.0–10.5)
WBC: 11.5 10*3/uL — ABNORMAL HIGH (ref 4.0–10.5)
nRBC: 0 % (ref 0.0–0.2)
nRBC: 0 % (ref 0.0–0.2)

## 2020-08-26 LAB — GLUCOSE, CAPILLARY
Glucose-Capillary: 103 mg/dL — ABNORMAL HIGH (ref 70–99)
Glucose-Capillary: 118 mg/dL — ABNORMAL HIGH (ref 70–99)
Glucose-Capillary: 117 mg/dL — ABNORMAL HIGH (ref 70–99)
Glucose-Capillary: 104 mg/dL — ABNORMAL HIGH (ref 70–99)
Glucose-Capillary: 104 mg/dL — ABNORMAL HIGH (ref 70–99)
Glucose-Capillary: 110 mg/dL — ABNORMAL HIGH (ref 70–99)
Glucose-Capillary: 143 mg/dL — ABNORMAL HIGH (ref 70–99)

## 2020-08-26 LAB — POCT I-STAT 7, (LYTES, BLD GAS, ICA,H+H)
Acid-Base Excess: 3 mmol/L — ABNORMAL HIGH (ref 0.0–2.0)
Acid-Base Excess: 5 mmol/L — ABNORMAL HIGH (ref 0.0–2.0)
Acid-Base Excess: 5 mmol/L — ABNORMAL HIGH (ref 0.0–2.0)
Acid-Base Excess: 3 mmol/L — ABNORMAL HIGH (ref 0.0–2.0)
Acid-Base Excess: 1 mmol/L (ref 0.0–2.0)
Acid-Base Excess: 3 mmol/L — ABNORMAL HIGH (ref 0.0–2.0)
Acid-Base Excess: 0 mmol/L (ref 0.0–2.0)
Acid-Base Excess: 0 mmol/L (ref 0.0–2.0)
Acid-base deficit: 1 mmol/L (ref 0.0–2.0)
Bicarbonate: 28.8 mmol/L — ABNORMAL HIGH (ref 20.0–28.0)
Bicarbonate: 29.9 mmol/L — ABNORMAL HIGH (ref 20.0–28.0)
Bicarbonate: 30.5 mmol/L — ABNORMAL HIGH (ref 20.0–28.0)
Bicarbonate: 26.9 mmol/L (ref 20.0–28.0)
Bicarbonate: 26.3 mmol/L (ref 20.0–28.0)
Bicarbonate: 27.2 mmol/L (ref 20.0–28.0)
Bicarbonate: 25.8 mmol/L (ref 20.0–28.0)
Bicarbonate: 23.8 mmol/L (ref 20.0–28.0)
Bicarbonate: 24.8 mmol/L (ref 20.0–28.0)
Calcium, Ion: 1.17 mmol/L (ref 1.15–1.40)
Calcium, Ion: 0.87 mmol/L — CL (ref 1.15–1.40)
Calcium, Ion: 1.01 mmol/L — ABNORMAL LOW (ref 1.15–1.40)
Calcium, Ion: 0.95 mmol/L — ABNORMAL LOW (ref 1.15–1.40)
Calcium, Ion: 0.94 mmol/L — ABNORMAL LOW (ref 1.15–1.40)
Calcium, Ion: 0.96 mmol/L — ABNORMAL LOW (ref 1.15–1.40)
Calcium, Ion: 1.02 mmol/L — ABNORMAL LOW (ref 1.15–1.40)
Calcium, Ion: 1.05 mmol/L — ABNORMAL LOW (ref 1.15–1.40)
Calcium, Ion: 1.04 mmol/L — ABNORMAL LOW (ref 1.15–1.40)
HCT: 43 % (ref 39.0–52.0)
HCT: 29 % — ABNORMAL LOW (ref 39.0–52.0)
HCT: 29 % — ABNORMAL LOW (ref 39.0–52.0)
HCT: 31 % — ABNORMAL LOW (ref 39.0–52.0)
HCT: 30 % — ABNORMAL LOW (ref 39.0–52.0)
HCT: 31 % — ABNORMAL LOW (ref 39.0–52.0)
HCT: 35 % — ABNORMAL LOW (ref 39.0–52.0)
HCT: 35 % — ABNORMAL LOW (ref 39.0–52.0)
HCT: 33 % — ABNORMAL LOW (ref 39.0–52.0)
Hemoglobin: 14.6 g/dL (ref 13.0–17.0)
Hemoglobin: 9.9 g/dL — ABNORMAL LOW (ref 13.0–17.0)
Hemoglobin: 9.9 g/dL — ABNORMAL LOW (ref 13.0–17.0)
Hemoglobin: 10.5 g/dL — ABNORMAL LOW (ref 13.0–17.0)
Hemoglobin: 10.2 g/dL — ABNORMAL LOW (ref 13.0–17.0)
Hemoglobin: 10.5 g/dL — ABNORMAL LOW (ref 13.0–17.0)
Hemoglobin: 11.9 g/dL — ABNORMAL LOW (ref 13.0–17.0)
Hemoglobin: 11.9 g/dL — ABNORMAL LOW (ref 13.0–17.0)
Hemoglobin: 11.2 g/dL — ABNORMAL LOW (ref 13.0–17.0)
O2 Saturation: 100 %
O2 Saturation: 100 %
O2 Saturation: 100 %
O2 Saturation: 100 %
O2 Saturation: 100 %
O2 Saturation: 100 %
O2 Saturation: 100 %
O2 Saturation: 99 %
O2 Saturation: 99 %
Patient temperature: 35.9
Patient temperature: 36.9
Patient temperature: 37.5
Potassium: 3.2 mmol/L — ABNORMAL LOW (ref 3.5–5.1)
Potassium: 3.6 mmol/L (ref 3.5–5.1)
Potassium: 4.2 mmol/L (ref 3.5–5.1)
Potassium: 4.2 mmol/L (ref 3.5–5.1)
Potassium: 3.5 mmol/L (ref 3.5–5.1)
Potassium: 4.2 mmol/L (ref 3.5–5.1)
Potassium: 3.5 mmol/L (ref 3.5–5.1)
Potassium: 3.7 mmol/L (ref 3.5–5.1)
Potassium: 4 mmol/L (ref 3.5–5.1)
Sodium: 141 mmol/L (ref 135–145)
Sodium: 141 mmol/L (ref 135–145)
Sodium: 136 mmol/L (ref 135–145)
Sodium: 138 mmol/L (ref 135–145)
Sodium: 139 mmol/L (ref 135–145)
Sodium: 141 mmol/L (ref 135–145)
Sodium: 141 mmol/L (ref 135–145)
Sodium: 141 mmol/L (ref 135–145)
Sodium: 140 mmol/L (ref 135–145)
TCO2: 30 mmol/L (ref 22–32)
TCO2: 31 mmol/L (ref 22–32)
TCO2: 32 mmol/L (ref 22–32)
TCO2: 28 mmol/L (ref 22–32)
TCO2: 27 mmol/L (ref 22–32)
TCO2: 28 mmol/L (ref 22–32)
TCO2: 27 mmol/L (ref 22–32)
TCO2: 25 mmol/L (ref 22–32)
TCO2: 26 mmol/L (ref 22–32)
pCO2 arterial: 49.4 mmHg — ABNORMAL HIGH (ref 32.0–48.0)
pCO2 arterial: 42.8 mmHg (ref 32.0–48.0)
pCO2 arterial: 47.2 mmHg (ref 32.0–48.0)
pCO2 arterial: 37.4 mmHg (ref 32.0–48.0)
pCO2 arterial: 38.1 mmHg (ref 32.0–48.0)
pCO2 arterial: 41.3 mmHg (ref 32.0–48.0)
pCO2 arterial: 41.3 mmHg (ref 32.0–48.0)
pCO2 arterial: 40.6 mmHg (ref 32.0–48.0)
pCO2 arterial: 41.8 mmHg (ref 32.0–48.0)
pH, Arterial: 7.373 (ref 7.350–7.450)
pH, Arterial: 7.453 — ABNORMAL HIGH (ref 7.350–7.450)
pH, Arterial: 7.418 (ref 7.350–7.450)
pH, Arterial: 7.464 — ABNORMAL HIGH (ref 7.350–7.450)
pH, Arterial: 7.411 (ref 7.350–7.450)
pH, Arterial: 7.462 — ABNORMAL HIGH (ref 7.350–7.450)
pH, Arterial: 7.398 (ref 7.350–7.450)
pH, Arterial: 7.375 (ref 7.350–7.450)
pH, Arterial: 7.384 (ref 7.350–7.450)
pO2, Arterial: 484 mmHg — ABNORMAL HIGH (ref 83.0–108.0)
pO2, Arterial: 366 mmHg — ABNORMAL HIGH (ref 83.0–108.0)
pO2, Arterial: 430 mmHg — ABNORMAL HIGH (ref 83.0–108.0)
pO2, Arterial: 427 mmHg — ABNORMAL HIGH (ref 83.0–108.0)
pO2, Arterial: 288 mmHg — ABNORMAL HIGH (ref 83.0–108.0)
pO2, Arterial: 341 mmHg — ABNORMAL HIGH (ref 83.0–108.0)
pO2, Arterial: 185 mmHg — ABNORMAL HIGH (ref 83.0–108.0)
pO2, Arterial: 168 mmHg — ABNORMAL HIGH (ref 83.0–108.0)
pO2, Arterial: 122 mmHg — ABNORMAL HIGH (ref 83.0–108.0)

## 2020-08-26 LAB — POCT I-STAT, CHEM 8
BUN: 7 mg/dL — ABNORMAL LOW (ref 8–23)
BUN: 7 mg/dL — ABNORMAL LOW (ref 8–23)
BUN: 7 mg/dL — ABNORMAL LOW (ref 8–23)
BUN: 7 mg/dL — ABNORMAL LOW (ref 8–23)
BUN: 6 mg/dL — ABNORMAL LOW (ref 8–23)
BUN: 5 mg/dL — ABNORMAL LOW (ref 8–23)
Calcium, Ion: 1.19 mmol/L (ref 1.15–1.40)
Calcium, Ion: 1.19 mmol/L (ref 1.15–1.40)
Calcium, Ion: 1.07 mmol/L — ABNORMAL LOW (ref 1.15–1.40)
Calcium, Ion: 1.06 mmol/L — ABNORMAL LOW (ref 1.15–1.40)
Calcium, Ion: 1 mmol/L — ABNORMAL LOW (ref 1.15–1.40)
Calcium, Ion: 0.95 mmol/L — ABNORMAL LOW (ref 1.15–1.40)
Chloride: 99 mmol/L (ref 98–111)
Chloride: 98 mmol/L (ref 98–111)
Chloride: 98 mmol/L (ref 98–111)
Chloride: 99 mmol/L (ref 98–111)
Chloride: 99 mmol/L (ref 98–111)
Chloride: 100 mmol/L (ref 98–111)
Creatinine, Ser: 0.5 mg/dL — ABNORMAL LOW (ref 0.61–1.24)
Creatinine, Ser: 0.6 mg/dL — ABNORMAL LOW (ref 0.61–1.24)
Creatinine, Ser: 0.5 mg/dL — ABNORMAL LOW (ref 0.61–1.24)
Creatinine, Ser: 0.5 mg/dL — ABNORMAL LOW (ref 0.61–1.24)
Creatinine, Ser: 0.4 mg/dL — ABNORMAL LOW (ref 0.61–1.24)
Creatinine, Ser: 0.4 mg/dL — ABNORMAL LOW (ref 0.61–1.24)
Glucose, Bld: 86 mg/dL (ref 70–99)
Glucose, Bld: 94 mg/dL (ref 70–99)
Glucose, Bld: 110 mg/dL — ABNORMAL HIGH (ref 70–99)
Glucose, Bld: 144 mg/dL — ABNORMAL HIGH (ref 70–99)
Glucose, Bld: 131 mg/dL — ABNORMAL HIGH (ref 70–99)
Glucose, Bld: 107 mg/dL — ABNORMAL HIGH (ref 70–99)
HCT: 40 % (ref 39.0–52.0)
HCT: 37 % — ABNORMAL LOW (ref 39.0–52.0)
HCT: 30 % — ABNORMAL LOW (ref 39.0–52.0)
HCT: 30 % — ABNORMAL LOW (ref 39.0–52.0)
HCT: 31 % — ABNORMAL LOW (ref 39.0–52.0)
HCT: 28 % — ABNORMAL LOW (ref 39.0–52.0)
Hemoglobin: 13.6 g/dL (ref 13.0–17.0)
Hemoglobin: 12.6 g/dL — ABNORMAL LOW (ref 13.0–17.0)
Hemoglobin: 10.2 g/dL — ABNORMAL LOW (ref 13.0–17.0)
Hemoglobin: 10.2 g/dL — ABNORMAL LOW (ref 13.0–17.0)
Hemoglobin: 10.5 g/dL — ABNORMAL LOW (ref 13.0–17.0)
Hemoglobin: 9.5 g/dL — ABNORMAL LOW (ref 13.0–17.0)
Potassium: 3.1 mmol/L — ABNORMAL LOW (ref 3.5–5.1)
Potassium: 3.4 mmol/L — ABNORMAL LOW (ref 3.5–5.1)
Potassium: 4 mmol/L (ref 3.5–5.1)
Potassium: 4.4 mmol/L (ref 3.5–5.1)
Potassium: 4.2 mmol/L (ref 3.5–5.1)
Potassium: 3.5 mmol/L (ref 3.5–5.1)
Sodium: 140 mmol/L (ref 135–145)
Sodium: 140 mmol/L (ref 135–145)
Sodium: 139 mmol/L (ref 135–145)
Sodium: 137 mmol/L (ref 135–145)
Sodium: 139 mmol/L (ref 135–145)
Sodium: 140 mmol/L (ref 135–145)
TCO2: 29 mmol/L (ref 22–32)
TCO2: 27 mmol/L (ref 22–32)
TCO2: 31 mmol/L (ref 22–32)
TCO2: 28 mmol/L (ref 22–32)
TCO2: 26 mmol/L (ref 22–32)
TCO2: 25 mmol/L (ref 22–32)

## 2020-08-26 LAB — APTT: aPTT: 42 seconds — ABNORMAL HIGH (ref 24–36)

## 2020-08-26 LAB — BASIC METABOLIC PANEL
Anion gap: 9 (ref 5–15)
BUN: 9 mg/dL (ref 8–23)
CO2: 20 mmol/L — ABNORMAL LOW (ref 22–32)
Calcium: 7 mg/dL — ABNORMAL LOW (ref 8.9–10.3)
Chloride: 105 mmol/L (ref 98–111)
Creatinine, Ser: 0.69 mg/dL (ref 0.61–1.24)
GFR calc Af Amer: >60 mL/min (ref >60)
GFR calc non Af Amer: >60 mL/min (ref >60)
Glucose, Bld: 126 mg/dL — ABNORMAL HIGH (ref 70–99)
Potassium: 3.6 mmol/L (ref 3.5–5.1)
Sodium: 134 mmol/L — ABNORMAL LOW (ref 135–145)

## 2020-08-26 LAB — ABO/RH: ABO/RH(D): O NEG

## 2020-08-26 LAB — ECHO INTRAOPERATIVE TEE
AV Mean grad: 5 mmHg
Height: 71 in
Weight: 2819 oz

## 2020-08-26 LAB — PLATELET COUNT: Platelets: 113 10*3/uL — ABNORMAL LOW (ref 150–400)

## 2020-08-26 LAB — MAGNESIUM: Magnesium: 2.5 mg/dL — ABNORMAL HIGH (ref 1.7–2.4)

## 2020-08-26 LAB — PROTIME-INR
INR: 1.5 — ABNORMAL HIGH (ref 0.8–1.2)
Prothrombin Time: 17.6 seconds — ABNORMAL HIGH (ref 11.4–15.2)

## 2020-08-26 LAB — HEMOGLOBIN AND HEMATOCRIT, BLOOD
HCT: 31.4 % — ABNORMAL LOW (ref 39.0–52.0)
Hemoglobin: 10.8 g/dL — ABNORMAL LOW (ref 13.0–17.0)

## 2020-08-26 LAB — SARS CORONAVIRUS 2 BY RT PCR (HOSPITAL ORDER, PERFORMED IN ~~LOC~~ HOSPITAL LAB): SARS Coronavirus 2: NEGATIVE

## 2020-08-26 SURGERY — CORONARY ARTERY BYPASS GRAFTING (CABG)
Anesthesia: General | Site: Esophagus

## 2020-08-26 MED ORDER — ORAL CARE MOUTH RINSE
15.0000 mL | Freq: Two times a day (BID) | OROMUCOSAL | Status: DC
  Administered 2020-08-27 – 2020-08-31 (×4): 15 mL via OROMUCOSAL

## 2020-08-26 MED ORDER — ASPIRIN EC 325 MG PO TBEC
325.0000 mg | DELAYED_RELEASE_TABLET | Freq: Every day | ORAL | Status: DC
  Administered 2020-08-27: 325 mg via ORAL
  Filled 2020-08-26 (×2): qty 1

## 2020-08-26 MED ORDER — PHENYLEPHRINE 40 MCG/ML (10ML) SYRINGE FOR IV PUSH (FOR BLOOD PRESSURE SUPPORT)
PREFILLED_SYRINGE | INTRAVENOUS | Status: AC
  Filled 2020-08-26: qty 10

## 2020-08-26 MED ORDER — LACTATED RINGERS IV SOLN: INTRAVENOUS | Status: DC

## 2020-08-26 MED ORDER — NITROGLYCERIN IN D5W 200-5 MCG/ML-% IV SOLN: 0.0000 ug/min | INTRAVENOUS | Status: DC

## 2020-08-26 MED ORDER — FENTANYL CITRATE (PF) 250 MCG/5ML IJ SOLN
INTRAMUSCULAR | Status: DC | PRN
Start: 2020-08-26 — End: 2020-08-26
  Administered 2020-08-26: 150 ug via INTRAVENOUS
  Administered 2020-08-26: 50 ug via INTRAVENOUS
  Administered 2020-08-26: 25 ug via INTRAVENOUS
  Administered 2020-08-26: 250 ug via INTRAVENOUS
  Administered 2020-08-26 (×2): 100 ug via INTRAVENOUS
  Administered 2020-08-26: 75 ug via INTRAVENOUS
  Administered 2020-08-26: 150 ug via INTRAVENOUS
  Administered 2020-08-26: 250 ug via INTRAVENOUS

## 2020-08-26 MED ORDER — ACETAMINOPHEN 650 MG RE SUPP: 650.0000 mg | Freq: Once | RECTAL | Status: AC

## 2020-08-26 MED ORDER — DEXTROSE 50 % IV SOLN: 0.0000 mL | INTRAVENOUS | Status: DC | PRN

## 2020-08-26 MED ORDER — METOPROLOL TARTRATE 5 MG/5ML IV SOLN: 2.5000 mg | INTRAVENOUS | Status: DC | PRN

## 2020-08-26 MED ORDER — ALBUMIN HUMAN 5 % IV SOLN: INTRAVENOUS | Status: DC | PRN

## 2020-08-26 MED ORDER — ASPIRIN 81 MG PO CHEW
324.0000 mg | CHEWABLE_TABLET | Freq: Every day | ORAL | Status: DC
  Administered 2020-08-28: 324 mg
  Filled 2020-08-26: qty 4

## 2020-08-26 MED ORDER — ORAL CARE MOUTH RINSE: 15.0000 mL | OROMUCOSAL | Status: DC

## 2020-08-26 MED ORDER — CHLORHEXIDINE GLUCONATE 0.12% ORAL RINSE (MEDLINE KIT)
15.0000 mL | Freq: Two times a day (BID) | OROMUCOSAL | Status: DC
  Administered 2020-08-26: 15 mL via OROMUCOSAL

## 2020-08-26 MED ORDER — THROMBIN 20000 UNITS EX SOLR
OROMUCOSAL | Status: DC | PRN
  Administered 2020-08-26 (×3): 4 mL via TOPICAL

## 2020-08-26 MED ORDER — ROCURONIUM BROMIDE 10 MG/ML (PF) SYRINGE
PREFILLED_SYRINGE | INTRAVENOUS | Status: DC | PRN
  Administered 2020-08-26: 50 mg via INTRAVENOUS
  Administered 2020-08-26: 70 mg via INTRAVENOUS
  Administered 2020-08-26 (×2): 50 mg via INTRAVENOUS
  Administered 2020-08-26: 30 mg via INTRAVENOUS

## 2020-08-26 MED ORDER — SODIUM CHLORIDE 0.9% FLUSH
3.0000 mL | Freq: Two times a day (BID) | INTRAVENOUS | Status: DC
  Administered 2020-08-27 – 2020-08-28 (×3): 3 mL via INTRAVENOUS

## 2020-08-26 MED ORDER — ONDANSETRON HCL 4 MG/2ML IJ SOLN
4.0000 mg | Freq: Four times a day (QID) | INTRAMUSCULAR | Status: DC | PRN
  Administered 2020-08-27 – 2020-08-28 (×4): 4 mg via INTRAVENOUS
  Filled 2020-08-26 (×4): qty 2

## 2020-08-26 MED ORDER — CHLORHEXIDINE GLUCONATE 0.12 % MT SOLN
15.0000 mL | OROMUCOSAL | Status: AC
  Administered 2020-08-26: 15 mL via OROMUCOSAL

## 2020-08-26 MED ORDER — ACETAMINOPHEN 160 MG/5ML PO SOLN
1000.0000 mg | Freq: Four times a day (QID) | ORAL | Status: DC
  Administered 2020-08-26: 1000 mg
  Filled 2020-08-26: qty 40.6

## 2020-08-26 MED ORDER — MAGNESIUM SULFATE 4 GM/100ML IV SOLN
4.0000 g | Freq: Once | INTRAVENOUS | Status: AC
  Administered 2020-08-26: 4 g via INTRAVENOUS
  Filled 2020-08-26: qty 100

## 2020-08-26 MED ORDER — FENTANYL CITRATE (PF) 250 MCG/5ML IJ SOLN
INTRAMUSCULAR | Status: AC
  Filled 2020-08-26: qty 25

## 2020-08-26 MED ORDER — SODIUM CHLORIDE 0.9 % IV SOLN
1.5000 g | Freq: Two times a day (BID) | INTRAVENOUS | Status: AC
  Administered 2020-08-26 – 2020-08-28 (×4): 1.5 g via INTRAVENOUS
  Filled 2020-08-26 (×4): qty 1.5

## 2020-08-26 MED ORDER — BISACODYL 10 MG RE SUPP: 10.0000 mg | Freq: Every day | RECTAL | Status: DC

## 2020-08-26 MED ORDER — HEPARIN SODIUM (PORCINE) 1000 UNIT/ML IJ SOLN
INTRAMUSCULAR | Status: AC
  Filled 2020-08-26: qty 1

## 2020-08-26 MED ORDER — VANCOMYCIN HCL IN DEXTROSE 1-5 GM/200ML-% IV SOLN
1000.0000 mg | Freq: Once | INTRAVENOUS | Status: AC
  Administered 2020-08-26: 1000 mg via INTRAVENOUS
  Filled 2020-08-26: qty 200

## 2020-08-26 MED ORDER — HEPARIN SODIUM (PORCINE) 1000 UNIT/ML IJ SOLN
INTRAMUSCULAR | Status: DC | PRN
  Administered 2020-08-26: 28000 [IU] via INTRAVENOUS

## 2020-08-26 MED ORDER — METOPROLOL TARTRATE 12.5 MG HALF TABLET: 12.5000 mg | ORAL_TABLET | Freq: Two times a day (BID) | ORAL | Status: DC

## 2020-08-26 MED ORDER — ACETAMINOPHEN 500 MG PO TABS
1000.0000 mg | ORAL_TABLET | Freq: Four times a day (QID) | ORAL | Status: DC
  Administered 2020-08-27 – 2020-08-28 (×5): 1000 mg via ORAL
  Filled 2020-08-26 (×5): qty 2

## 2020-08-26 MED ORDER — POTASSIUM CHLORIDE 10 MEQ/50ML IV SOLN
10.0000 meq | INTRAVENOUS | Status: AC
  Administered 2020-08-26 (×3): 10 meq via INTRAVENOUS
  Filled 2020-08-26 (×3): qty 50

## 2020-08-26 MED ORDER — TRAMADOL HCL 50 MG PO TABS
50.0000 mg | ORAL_TABLET | ORAL | Status: DC | PRN
  Administered 2020-08-27: 50 mg via ORAL
  Filled 2020-08-26: qty 1

## 2020-08-26 MED ORDER — ROCURONIUM BROMIDE 10 MG/ML (PF) SYRINGE
PREFILLED_SYRINGE | INTRAVENOUS | Status: AC
  Filled 2020-08-26: qty 30

## 2020-08-26 MED ORDER — FAMOTIDINE IN NACL 20-0.9 MG/50ML-% IV SOLN
20.0000 mg | Freq: Two times a day (BID) | INTRAVENOUS | Status: DC
  Administered 2020-08-26: 20 mg via INTRAVENOUS
  Filled 2020-08-26 (×2): qty 50

## 2020-08-26 MED ORDER — ACETAMINOPHEN 160 MG/5ML PO SOLN
650.0000 mg | Freq: Once | ORAL | Status: AC
  Administered 2020-08-26: 650 mg
  Filled 2020-08-26: qty 20.3

## 2020-08-26 MED ORDER — PANTOPRAZOLE SODIUM 40 MG PO TBEC
40.0000 mg | DELAYED_RELEASE_TABLET | Freq: Every day | ORAL | Status: DC
  Administered 2020-08-28: 40 mg via ORAL
  Filled 2020-08-26: qty 1

## 2020-08-26 MED ORDER — THROMBIN (RECOMBINANT) 20000 UNITS EX SOLR
CUTANEOUS | Status: AC
  Filled 2020-08-26: qty 20000

## 2020-08-26 MED ORDER — LIDOCAINE 2% (20 MG/ML) 5 ML SYRINGE
INTRAMUSCULAR | Status: AC
  Filled 2020-08-26: qty 5

## 2020-08-26 MED ORDER — PROTAMINE SULFATE 10 MG/ML IV SOLN
INTRAVENOUS | Status: AC
  Filled 2020-08-26: qty 25

## 2020-08-26 MED ORDER — SODIUM CHLORIDE 0.9 % IV SOLN: 250.0000 mL | INTRAVENOUS | Status: DC

## 2020-08-26 MED ORDER — PROPOFOL 10 MG/ML IV BOLUS
INTRAVENOUS | Status: AC
  Filled 2020-08-26: qty 20

## 2020-08-26 MED ORDER — PROTAMINE SULFATE 10 MG/ML IV SOLN
INTRAVENOUS | Status: AC
  Filled 2020-08-26: qty 5

## 2020-08-26 MED ORDER — DOCUSATE SODIUM 100 MG PO CAPS
200.0000 mg | ORAL_CAPSULE | Freq: Every day | ORAL | Status: DC
  Administered 2020-08-27 – 2020-08-28 (×2): 200 mg via ORAL
  Filled 2020-08-26 (×2): qty 2

## 2020-08-26 MED ORDER — MIDAZOLAM HCL 5 MG/5ML IJ SOLN
INTRAMUSCULAR | Status: DC | PRN
  Administered 2020-08-26: 2 mg via INTRAVENOUS
  Administered 2020-08-26 (×3): 1 mg via INTRAVENOUS

## 2020-08-26 MED ORDER — CHLORHEXIDINE GLUCONATE 4 % EX LIQD: 30.0000 mL | CUTANEOUS | Status: DC

## 2020-08-26 MED ORDER — OXYCODONE HCL 5 MG PO TABS
5.0000 mg | ORAL_TABLET | ORAL | Status: DC | PRN
  Administered 2020-08-26 – 2020-08-27 (×2): 10 mg via ORAL
  Filled 2020-08-26 (×2): qty 2

## 2020-08-26 MED ORDER — LACTATED RINGERS IV SOLN: 500.0000 mL | Freq: Once | INTRAVENOUS | Status: DC | PRN

## 2020-08-26 MED ORDER — PROPOFOL 10 MG/ML IV BOLUS
INTRAVENOUS | Status: DC | PRN
  Administered 2020-08-26: 70 mg via INTRAVENOUS
  Administered 2020-08-26: 30 mg via INTRAVENOUS
  Administered 2020-08-26: 40 mg via INTRAVENOUS

## 2020-08-26 MED ORDER — CHLORHEXIDINE GLUCONATE 0.12 % MT SOLN
15.0000 mL | Freq: Once | OROMUCOSAL | Status: AC
  Administered 2020-08-26: 15 mL via OROMUCOSAL
  Filled 2020-08-26: qty 15

## 2020-08-26 MED ORDER — MORPHINE SULFATE (PF) 2 MG/ML IV SOLN
1.0000 mg | INTRAVENOUS | Status: DC | PRN
  Administered 2020-08-27: 2 mg via INTRAVENOUS
  Filled 2020-08-26: qty 1

## 2020-08-26 MED ORDER — SODIUM CHLORIDE 0.9 % IV SOLN: INTRAVENOUS | Status: DC

## 2020-08-26 MED ORDER — TAMSULOSIN HCL 0.4 MG PO CAPS
0.4000 mg | ORAL_CAPSULE | Freq: Every day | ORAL | Status: DC
  Administered 2020-08-27 – 2020-08-31 (×5): 0.4 mg via ORAL
  Filled 2020-08-26 (×5): qty 1

## 2020-08-26 MED ORDER — MIDAZOLAM HCL 2 MG/2ML IJ SOLN: 2.0000 mg | INTRAMUSCULAR | Status: DC | PRN

## 2020-08-26 MED ORDER — INSULIN ASPART 100 UNIT/ML ~~LOC~~ SOLN
0.0000 [IU] | SUBCUTANEOUS | Status: DC
  Administered 2020-08-26: 4 [IU] via SUBCUTANEOUS
  Administered 2020-08-27: 2 [IU] via SUBCUTANEOUS

## 2020-08-26 MED ORDER — LACTATED RINGERS IV SOLN: INTRAVENOUS | Status: DC | PRN

## 2020-08-26 MED ORDER — PLASMA-LYTE 148 IV SOLN
INTRAVENOUS | Status: DC | PRN
  Administered 2020-08-26: 500 mL

## 2020-08-26 MED ORDER — DEXMEDETOMIDINE HCL IN NACL 400 MCG/100ML IV SOLN: 0.0000 ug/kg/h | INTRAVENOUS | Status: DC

## 2020-08-26 MED ORDER — 0.9 % SODIUM CHLORIDE (POUR BTL) OPTIME
TOPICAL | Status: DC | PRN
  Administered 2020-08-26: 5000 mL

## 2020-08-26 MED ORDER — LIDOCAINE 2% (20 MG/ML) 5 ML SYRINGE
INTRAMUSCULAR | Status: DC | PRN
  Administered 2020-08-26: 100 mg via INTRAVENOUS

## 2020-08-26 MED ORDER — SODIUM CHLORIDE 0.45 % IV SOLN: INTRAVENOUS | Status: DC | PRN

## 2020-08-26 MED ORDER — ALBUMIN HUMAN 5 % IV SOLN
250.0000 mL | INTRAVENOUS | Status: DC | PRN
  Administered 2020-08-26 (×3): 12.5 g via INTRAVENOUS
  Filled 2020-08-26: qty 250

## 2020-08-26 MED ORDER — POTASSIUM CHLORIDE 10 MEQ/50ML IV SOLN
10.0000 meq | INTRAVENOUS | Status: AC
  Administered 2020-08-26 (×3): 10 meq via INTRAVENOUS

## 2020-08-26 MED ORDER — PHENYLEPHRINE 40 MCG/ML (10ML) SYRINGE FOR IV PUSH (FOR BLOOD PRESSURE SUPPORT)
PREFILLED_SYRINGE | INTRAVENOUS | Status: DC | PRN
  Administered 2020-08-26 (×2): 40 ug via INTRAVENOUS
  Administered 2020-08-26 (×2): 80 ug via INTRAVENOUS
  Administered 2020-08-26: 20 ug via INTRAVENOUS
  Administered 2020-08-26: 60 ug via INTRAVENOUS
  Administered 2020-08-26: 40 ug via INTRAVENOUS

## 2020-08-26 MED ORDER — INSULIN REGULAR(HUMAN) IN NACL 100-0.9 UT/100ML-% IV SOLN: INTRAVENOUS | Status: DC

## 2020-08-26 MED ORDER — PHENYLEPHRINE HCL-NACL 20-0.9 MG/250ML-% IV SOLN: 0.0000 ug/min | INTRAVENOUS | Status: DC

## 2020-08-26 MED ORDER — BISACODYL 5 MG PO TBEC
10.0000 mg | DELAYED_RELEASE_TABLET | Freq: Every day | ORAL | Status: DC
  Administered 2020-08-27 – 2020-08-28 (×2): 10 mg via ORAL
  Filled 2020-08-26 (×2): qty 2

## 2020-08-26 MED ORDER — PROTAMINE SULFATE 10 MG/ML IV SOLN
INTRAVENOUS | Status: DC | PRN
  Administered 2020-08-26: 280 mg via INTRAVENOUS

## 2020-08-26 MED ORDER — SODIUM CHLORIDE 0.9% FLUSH: 3.0000 mL | INTRAVENOUS | Status: DC | PRN

## 2020-08-26 MED ORDER — METOPROLOL TARTRATE 12.5 MG HALF TABLET
12.5000 mg | ORAL_TABLET | Freq: Once | ORAL | Status: DC
  Filled 2020-08-26: qty 1

## 2020-08-26 MED ORDER — MIDAZOLAM HCL (PF) 10 MG/2ML IJ SOLN
INTRAMUSCULAR | Status: AC
  Filled 2020-08-26: qty 2

## 2020-08-26 MED ORDER — CHLORHEXIDINE GLUCONATE CLOTH 2 % EX PADS
6.0000 | MEDICATED_PAD | Freq: Every day | CUTANEOUS | Status: DC
  Administered 2020-08-26 – 2020-08-28 (×3): 6 via TOPICAL

## 2020-08-26 MED ORDER — METOPROLOL TARTRATE 25 MG/10 ML ORAL SUSPENSION
12.5000 mg | Freq: Two times a day (BID) | ORAL | Status: DC
  Administered 2020-08-26: 12.5 mg
  Filled 2020-08-26: qty 5

## 2020-08-26 SURGICAL SUPPLY — 127 items
ADH SKN CLS APL DERMABOND .7 (GAUZE/BANDAGES/DRESSINGS) ×2
ATRICLIP EXCLUSION VLAA 45 (Miscellaneous) ×3 IMPLANT
ATRICLIP EXCLUSION VLAA 45MM (Miscellaneous) ×1 IMPLANT
AtriClip Guide ×2 IMPLANT
BAG DECANTER FOR FLEXI CONT (MISCELLANEOUS) ×4 IMPLANT
BLADE CLIPPER SURG (BLADE) ×4 IMPLANT
BLADE STERNUM SYSTEM 6 (BLADE) ×4 IMPLANT
BNDG CMPR MED 10X6 ELC LF (GAUZE/BANDAGES/DRESSINGS) ×2
BNDG ELASTIC 3X5.8 VLCR STR LF (GAUZE/BANDAGES/DRESSINGS) ×2 IMPLANT
BNDG ELASTIC 4X5.8 VLCR STR LF (GAUZE/BANDAGES/DRESSINGS) ×4 IMPLANT
BNDG ELASTIC 6X10 VLCR STRL LF (GAUZE/BANDAGES/DRESSINGS) ×2 IMPLANT
BNDG ELASTIC 6X5.8 VLCR STR LF (GAUZE/BANDAGES/DRESSINGS) ×4 IMPLANT
BNDG GAUZE ELAST 4 BULKY (GAUZE/BANDAGES/DRESSINGS) ×4 IMPLANT
CANISTER SUCT 3000ML PPV (MISCELLANEOUS) ×4 IMPLANT
CANN PRFSN 3/8X14X24FR PCFC (MISCELLANEOUS) ×2
CANNULA PRFSN 3/8X14X24FR PCFC (MISCELLANEOUS) IMPLANT
CANNULA SUMP PERICARDIAL (CANNULA) ×2 IMPLANT
CANNULA VEN MTL TIP RT (MISCELLANEOUS) ×4
CANNULA VRC MALB SNGL STG 34FR (MISCELLANEOUS) IMPLANT
CARDIOBLATE CARDIAC ABLATION (MISCELLANEOUS)
CATH ROBINSON RED A/P 18FR (CATHETERS) ×10 IMPLANT
CATH THORACIC 28FR (CATHETERS) ×4 IMPLANT
CATH THORACIC 36FR (CATHETERS) ×4 IMPLANT
CATH THORACIC 36FR RT ANG (CATHETERS) ×4 IMPLANT
CLAMP ISOLATOR SYNERGY LG (MISCELLANEOUS) ×2 IMPLANT
CLIP VESOCCLUDE MED 24/CT (CLIP) IMPLANT
CLIP VESOCCLUDE SM WIDE 24/CT (CLIP) ×2 IMPLANT
CONN 1/2X1/2X1/2  BEN (MISCELLANEOUS) ×4
CONN 1/2X1/2X1/2 BEN (MISCELLANEOUS) IMPLANT
CONN 3/8X1/2 ST GISH (MISCELLANEOUS) ×4 IMPLANT
DERMABOND ADVANCED (GAUZE/BANDAGES/DRESSINGS) ×2
DERMABOND ADVANCED .7 DNX12 (GAUZE/BANDAGES/DRESSINGS) IMPLANT
DEVICE CARDIOBLATE CARDIAC ABL (MISCELLANEOUS) IMPLANT
DEVICE EXCLUSIN ATRCLP VLAA 45 (Miscellaneous) IMPLANT
DRAPE CARDIOVASCULAR INCISE (DRAPES) ×4
DRAPE SLUSH/WARMER DISC (DRAPES) ×4 IMPLANT
DRAPE SRG 135X102X78XABS (DRAPES) ×2 IMPLANT
DRSG COVADERM 4X14 (GAUZE/BANDAGES/DRESSINGS) ×4 IMPLANT
ELECT CAUTERY BLADE 6.4 (BLADE) ×4 IMPLANT
ELECT REM PT RETURN 9FT ADLT (ELECTROSURGICAL) ×8
ELECTRODE REM PT RTRN 9FT ADLT (ELECTROSURGICAL) ×4 IMPLANT
FELT TEFLON 1X6 (MISCELLANEOUS) ×6 IMPLANT
GAUZE SPONGE 4X4 12PLY STRL (GAUZE/BANDAGES/DRESSINGS) ×6 IMPLANT
GLOVE BIO SURGEON STRL SZ 6 (GLOVE) IMPLANT
GLOVE BIO SURGEON STRL SZ 6.5 (GLOVE) ×9 IMPLANT
GLOVE BIO SURGEON STRL SZ7 (GLOVE) IMPLANT
GLOVE BIO SURGEON STRL SZ7.5 (GLOVE) IMPLANT
GLOVE BIO SURGEONS STRL SZ 6.5 (GLOVE) ×9
GLOVE BIOGEL PI IND STRL 6 (GLOVE) IMPLANT
GLOVE BIOGEL PI IND STRL 6.5 (GLOVE) IMPLANT
GLOVE BIOGEL PI IND STRL 7.0 (GLOVE) IMPLANT
GLOVE BIOGEL PI INDICATOR 6 (GLOVE)
GLOVE BIOGEL PI INDICATOR 6.5 (GLOVE)
GLOVE BIOGEL PI INDICATOR 7.0 (GLOVE)
GLOVE ORTHO TXT STRL SZ7.5 (GLOVE) IMPLANT
GLOVE SURG SS PI 7.5 STRL IVOR (GLOVE) ×6 IMPLANT
GLOVE TRIUMPH SURG SIZE 7.0 (KITS) ×8 IMPLANT
GOWN STRL REUS W/ TWL LRG LVL3 (GOWN DISPOSABLE) ×8 IMPLANT
GOWN STRL REUS W/ TWL XL LVL3 (GOWN DISPOSABLE) ×2 IMPLANT
GOWN STRL REUS W/TWL LRG LVL3 (GOWN DISPOSABLE) ×16
GOWN STRL REUS W/TWL XL LVL3 (GOWN DISPOSABLE) ×4
HEMOSTAT POWDER SURGIFOAM 1G (HEMOSTASIS) ×12 IMPLANT
HEMOSTAT SURGICEL 2X14 (HEMOSTASIS) ×4 IMPLANT
INSERT FOGARTY 61MM (MISCELLANEOUS) IMPLANT
INSERT FOGARTY XLG (MISCELLANEOUS) ×2 IMPLANT
KIT BASIN OR (CUSTOM PROCEDURE TRAY) ×4 IMPLANT
KIT CATH CPB BARTLE (MISCELLANEOUS) ×4 IMPLANT
KIT SUCTION CATH 14FR (SUCTIONS) ×4 IMPLANT
KIT TURNOVER KIT B (KITS) ×4 IMPLANT
KIT VASOVIEW HEMOPRO 2 VH 4000 (KITS) ×4 IMPLANT
LOOP VESSEL SUPERMAXI WHITE (MISCELLANEOUS) ×4 IMPLANT
NS IRRIG 1000ML POUR BTL (IV SOLUTION) ×20 IMPLANT
PACK E OPEN HEART (SUTURE) ×4 IMPLANT
PACK OPEN HEART (CUSTOM PROCEDURE TRAY) ×4 IMPLANT
PAD ARMBOARD 7.5X6 YLW CONV (MISCELLANEOUS) ×8 IMPLANT
PAD ELECT DEFIB RADIOL ZOLL (MISCELLANEOUS) ×4 IMPLANT
PENCIL BUTTON HOLSTER BLD 10FT (ELECTRODE) ×4 IMPLANT
POSITIONER HEAD DONUT 9IN (MISCELLANEOUS) ×4 IMPLANT
PROBE CRYO2-ABLATION MALLABLE (MISCELLANEOUS) ×2 IMPLANT
PUNCH AORTIC ROTATE 4.0MM (MISCELLANEOUS) IMPLANT
PUNCH AORTIC ROTATE 4.5MM 8IN (MISCELLANEOUS) ×4 IMPLANT
PUNCH AORTIC ROTATE 5MM 8IN (MISCELLANEOUS) IMPLANT
SET CARDIOPLEGIA MPS 5001102 (MISCELLANEOUS) ×2 IMPLANT
SPONGE INTESTINAL PEANUT (DISPOSABLE) IMPLANT
SPONGE LAP 18X18 RF (DISPOSABLE) ×2 IMPLANT
SPONGE LAP 4X18 RFD (DISPOSABLE) ×4 IMPLANT
SUPPORT HEART JANKE-BARRON (MISCELLANEOUS) ×4 IMPLANT
SUT BONE WAX W31G (SUTURE) ×4 IMPLANT
SUT ETHIBOND 2 0 SH (SUTURE) ×8
SUT ETHIBOND 2 0 SH 36X2 (SUTURE) IMPLANT
SUT MNCRL AB 4-0 PS2 18 (SUTURE) ×2 IMPLANT
SUT PROLENE 3 0 SH DA (SUTURE) ×8 IMPLANT
SUT PROLENE 3 0 SH1 36 (SUTURE) ×4 IMPLANT
SUT PROLENE 4 0 RB 1 (SUTURE) ×16
SUT PROLENE 4 0 SH DA (SUTURE) IMPLANT
SUT PROLENE 4-0 RB1 .5 CRCL 36 (SUTURE) IMPLANT
SUT PROLENE 5 0 C 1 36 (SUTURE) IMPLANT
SUT PROLENE 6 0 C 1 24 (SUTURE) ×4 IMPLANT
SUT PROLENE 6 0 C 1 30 (SUTURE) ×2 IMPLANT
SUT PROLENE 7 0 BV 1 (SUTURE) IMPLANT
SUT PROLENE 7 0 BV1 MDA (SUTURE) ×6 IMPLANT
SUT PROLENE 8 0 BV175 6 (SUTURE) ×2 IMPLANT
SUT SILK  1 MH (SUTURE)
SUT SILK 1 MH (SUTURE) IMPLANT
SUT SILK 2 0 SH (SUTURE) IMPLANT
SUT STEEL 6MS V (SUTURE) ×4 IMPLANT
SUT STEEL STERNAL CCS#1 18IN (SUTURE) IMPLANT
SUT STEEL SZ 6 DBL 3X14 BALL (SUTURE) IMPLANT
SUT VIC AB 1 CTX 36 (SUTURE) ×16
SUT VIC AB 1 CTX36XBRD ANBCTR (SUTURE) ×4 IMPLANT
SUT VIC AB 2-0 CT1 27 (SUTURE) ×4
SUT VIC AB 2-0 CT1 TAPERPNT 27 (SUTURE) IMPLANT
SUT VIC AB 2-0 CTX 27 (SUTURE) IMPLANT
SUT VIC AB 3-0 SH 27 (SUTURE)
SUT VIC AB 3-0 SH 27X BRD (SUTURE) IMPLANT
SUT VIC AB 3-0 X1 27 (SUTURE) IMPLANT
SUT VICRYL 4-0 PS2 18IN ABS (SUTURE) IMPLANT
SYSTEM SAHARA CHEST DRAIN ATS (WOUND CARE) ×4 IMPLANT
TAPE CLOTH SURG 4X10 WHT LF (GAUZE/BANDAGES/DRESSINGS) ×2 IMPLANT
TOWEL GREEN STERILE (TOWEL DISPOSABLE) ×4 IMPLANT
TOWEL GREEN STERILE FF (TOWEL DISPOSABLE) ×4 IMPLANT
TRAY FOLEY SLVR 16FR TEMP STAT (SET/KITS/TRAYS/PACK) ×4 IMPLANT
TUBING LAP HI FLOW INSUFFLATIO (TUBING) ×4 IMPLANT
UNDERPAD 30X36 HEAVY ABSORB (UNDERPADS AND DIAPERS) ×4 IMPLANT
VRC MALLEABLE SINGLE STG 34FR (MISCELLANEOUS) ×4
WATER STERILE IRR 1000ML POUR (IV SOLUTION) ×8 IMPLANT
YANKAUER SUCT BULB TIP NO VENT (SUCTIONS) ×2 IMPLANT

## 2020-08-26 NOTE — Progress Notes (Signed)
RT assessed pts readiness to continue to wean per heart wean protocol. Pt able to perform all previous task w/out difficulty. Pt placed in CPAP/PS mode 10/5 40%. Pt respiratory status is stable at this time w/no distress noted at this time. RT will continue to monitor.

## 2020-08-26 NOTE — Brief Op Note (Signed)
08/26/2020  2:20 PM  PATIENT:  Joseph Larsen  74 y.o. male  PRE-OPERATIVE DIAGNOSIS:  CAD AFIB  POST-OPERATIVE DIAGNOSIS:  CAD AFIB  PROCEDURES:   CORONARY ARTERY BYPASS GRAFTING TIMES FOUR USING LEFT INTERNAL MAMMARY ARTERY AND ENDOSCOPICALLY HARVESTED RIGHT GREATER SAPHENOUS VEIN   LIMA->LAD SVG->D1 SVG->Ramus Intermediate SVG->OM  Vein Harvest Time- 77min      Vein Prep Time- 61min  MAZE PROCEDURE  CLIPPING OF ATRIAL APPENDAGE   TRANSESOPHAGEAL ECHOCARDIOGRAM    SURGEON: Gaye Pollack, MD - Primary  PHYSICIAN ASSISTANT: Shaterrica Territo   ANESTHESIA:   general  EBL:  Per perfusion and anesthesia records   BLOOD ADMINISTERED:none  DRAINS: Mediastinal and left pleural tubes   LOCAL MEDICATIONS USED:  NONE  SPECIMEN:  No Specimen  DISPOSITION OF SPECIMEN:  N/A  COUNTS:  YES  DICTATION: .Dragon Dictation  PLAN OF CARE: Admit to inpatient   PATIENT DISPOSITION:  ICU - intubated and hemodynamically stable.   Delay start of Pharmacological VTE agent (>24hrs) due to surgical blood loss or risk of bleeding: yes

## 2020-08-26 NOTE — Progress Notes (Signed)
      [address]       [place] [address]             [phone]      S/p CABG x 4  Intubated, sedated  BP (!) 101/56   Pulse 80   Temp 97.7 F (36.5 C) (Temporal)   Resp 12   Ht 5\' 11"  (1.803 m)   Wt 79.9 kg   SpO2 100%   BMI 24.57 kg/m  PA 13/6 CI= 1.4   Intake/Output Summary (Last 24 hours) at 08/26/2020 1719 Last data filed at 08/26/2020 1627 Gross per 24 hour  Intake 3171 ml  Output 2877 ml  Net 294 ml   Hct = 35  Doing well early postop  Remo Lipps C. Roxan Hockey, MD [facility] [phone]

## 2020-08-26 NOTE — Progress Notes (Signed)
Pt now CPAP/PS per RT Abigail Butts. Pt tolerating well. Will draw ABG in 84min.

## 2020-08-26 NOTE — Progress Notes (Signed)
Called RT second time to f/u regarding starting wean protocol.

## 2020-08-26 NOTE — Interval H&P Note (Signed)
History and Physical Interval Note:  08/26/2020 7:16 AM  Joseph Larsen  has presented today for surgery, with the diagnosis of CAD AFIB.  The various methods of treatment have been discussed with the patient and family. After consideration of risks, benefits and other options for treatment, the patient has consented to  Procedure(s): CORONARY ARTERY BYPASS GRAFTING (CABG) (N/A) MAZE (N/A) TRANSESOPHAGEAL ECHOCARDIOGRAM (TEE) (N/A) as a surgical intervention.  The patient's history has been reviewed, patient examined, no change in status, stable for surgery.  I have reviewed the patient's chart and labs.  Questions were answered to the patient's satisfaction.     Gaye Pollack

## 2020-08-26 NOTE — Progress Notes (Signed)
Called RT to notify ready to begin RWP.

## 2020-08-26 NOTE — Procedures (Signed)
Extubation Procedure Note  Patient Details:   Name: CAEDIN MOGAN DOB: 1946/01/14 MRN: [id number]   Airway Documentation:    Vent end date: 08/26/20 Vent end time: 2202   Evaluation  O2 sats: stable throughout Complications: No apparent complications Patient did tolerate procedure well. Bilateral Breath Sounds: Clear, Diminished   Yes  Pt NIF -22, VC 1.7L, pt able to speak name, positive for cuff leak, no stridor post extubation, pt placed on 4 LPM [place].   Roby Lofts Breeona Waid 08/26/2020, 10:06 PM

## 2020-08-26 NOTE — Progress Notes (Signed)
RT assessed pt for readiness to wean on heart protocol. Pt is able to hold head off of pillow, stick out tongue, squeeze hand and wiggle toes when instructed to do so w/out difficulty. Pt placed in first phase of wean at this time. Pt respiratory status is stable w/no distress noted. RT will continue to monitor.

## 2020-08-26 NOTE — Progress Notes (Signed)
Called RT to remind pt's readiness to test mechanics for extubation. Awaiting RT to return to bedside.

## 2020-08-26 NOTE — Transfer of Care (Signed)
Immediate Anesthesia Transfer of Care Note  Patient: Joseph Larsen  Procedure(s) Performed: CORONARY ARTERY BYPASS GRAFTING TIMES FOUR USING LEFT INTERNAL MAMMARY ARTERY AND ENDOSCOPICALLY HARVESTED RIGHT GREATER SAPHENOUS VEIN (N/A Chest) MAZE (N/A Chest) TRANSESOPHAGEAL ECHOCARDIOGRAM (TEE) (N/A Esophagus) CLIPPING OF ATRIAL APPENDAGE (N/A Chest)  Patient Location: ICU  Anesthesia Type:General  Level of Consciousness: Patient remains intubated per anesthesia plan  Airway & Oxygen Therapy: Patient remains intubated per anesthesia plan and Patient placed on Ventilator (see vital sign flow sheet for setting)  Post-op Assessment: Report given to RN and Post -op Vital signs reviewed and stable  Post vital signs: Reviewed and stable  Last Vitals:  Vitals Value Taken Time  BP 100/81 08/26/20 1625  Temp 36 C 08/26/20 1627  Pulse 80 08/26/20 1627  Resp 14 08/26/20 1627  SpO2 100 % 08/26/20 1627  Vitals shown include unvalidated device data.  Last Pain:  Vitals:   08/26/20 0659  TempSrc: Temporal  PainSc: 0-No pain         Complications: No complications documented.

## 2020-08-26 NOTE — H&P (Signed)
[address]       [place] [address]             [phone]      Cardiothoracic Surgery Admission History and Physical   PCP is [name], DO  Referring Provider is Martinique, Peter M, MD      Chief Complaint  Patient presents with  . Coronary Artery Disease      . Atrial Fibrillation   HPI:  The patient is a 74 year old gentleman with history of hypertension, hyperlipidemia, paroxysmal and now persistent atrial fibrillation who was maintaining normal sinus rhythm on flecainide after his most recent cardioversion. He had an exercise Myoview suggesting possible inferior scar and a coronary CTA showed a calcium score of 3159 with calcified plaque most prominently in the distal left main and proximal LAD as well as the proximal to mid RCA. FFR of the left main was 0.89 suggesting no significant stenosis. FFR in the LAD was 0.76 at the origin of the first diagonal and 0.66 in the midportion. Distally it was 0.58 suggesting significant LAD stenosis. The FFR of left circumflex was 0.86. It was not possible to interpret the RCA data due to artifact. He subsequently underwent cardiac catheterization on 08/07/2020 which showed 50% calcified left main stenosis. The proximal to mid LAD had 90% stenosis. The ramus had 90% stenosis. The ostial to proximal left circumflex had 70% stenosis. RCA had mild nonobstructive disease. Left ventricular systolic function was normal with normal LVEDP.   He retired as a Administrator due to disability. He lives with his wife. He has remained active walking 2 miles per day until his recent diagnosis by catheterization. He denies any chest pain or pressure. He denies any shortness of breath, dizziness, syncope, or leg swelling.      Past Medical History:  Diagnosis Date  . Abnormal ECG    a. 08/2012 Abnl ETT with ST changes in recovery; b. 08/2012 Ex MV, ex time 10:00, EF 66%, no ischemia/infarct.  . Allergic rhinitis   . Anxiety   .  Cataract   . Depression   . Diverticular disease   . Dyspnea on exertion   . ETOH abuse   . GERD (gastroesophageal reflux disease)   . Gout   . Hemorrhoids   . Hiatal hernia   . HTN (hypertension)   . Low testosterone   . MACROCYTIC ANEMIA   . NONSPEC ELEVATION OF LEVELS OF TRANSAMINASE/LDH   . Nonspecific abnormal finding in stool contents   . Palpitations   . PTSD (post-traumatic stress disorder)   . PTSD (post-traumatic stress disorder)    [name]        Past Surgical History:  Procedure Laterality Date  . CARDIOVERSION N/A 05/15/2020   Procedure: CARDIOVERSION; Surgeon: Sueanne Margarita, MD; Location: [facility] ENDOSCOPY; Service: Cardiovascular; Laterality: N/A;  . CARDIOVERSION N/A 06/01/2020   Procedure: CARDIOVERSION; Surgeon: Elouise Munroe, MD; Location: [facility] ENDOSCOPY; Service: Cardiovascular; Laterality: N/A;  . CHOLECYSTECTOMY  2004  . KNEE SURGERY Bilateral   . LEFT HEART CATH AND CORONARY ANGIOGRAPHY N/A 08/07/2020   Procedure: LEFT HEART CATH AND CORONARY ANGIOGRAPHY; Surgeon: Martinique, Peter M, MD; Location: [place] CV LAB; Service: Cardiovascular; Laterality: N/A;  . SHOULDER ARTHROSCOPY Right 01/14/2020        Family History  Problem Relation Age of Onset  . Heart disease Mother    "died of chf"  . Heart disease Father    "died  of old age"  . Hypertension Sister   . Liver disease Brother   . Hypertension Brother   . Hypertension Sister   . Hypertension Sister   . Hypertension Sister   . Heart murmur Sister   . Colon cancer Neg Hx    Social History  Social History        Tobacco Use  . Smoking status: Former Smoker    Packs/day: 3.00    Types: Cigarettes    Start date: 02/09/1965    Quit date: 05/12/1982    Years since quitting: 38.2  . Smokeless tobacco: Never Used  Vaping Use  . Vaping Use: Never used  Substance Use Topics  . Alcohol use: Yes    Alcohol/week: 14.0 standard drinks    Types: 14 Cans of beer per week    Comment: drinks  2-3 cans of beer/day   . Drug use: No    Comment: 1 cup of decaf coffee/day.         Current Outpatient Medications  Medication Sig Dispense Refill  . acetaminophen (TYLENOL) 500 MG tablet Take 1,000 mg by mouth daily as needed for moderate pain or headache.    . allopurinol (ZYLOPRIM) 300 MG tablet Take 150 mg by mouth daily.    Marland Kitchen ALPRAZolam (XANAX) 0.5 MG tablet Take 0.25 mg by mouth at bedtime.     Marland Kitchen atenolol (TENORMIN) 25 MG tablet Take 0.5 tablets (12.5 mg total) by mouth daily. 60 tablet 3  . Cholecalciferol (VITAMIN D) 50 MCG (2000 UT) tablet Take 2,000 Units by mouth daily.    . Cyanocobalamin 1000 MCG/ML KIT Inject 1,000 mcg as directed every 30 (thirty) days.    . fluticasone (FLONASE) 50 MCG/ACT nasal spray Place 2 sprays into both nostrils 2 (two) times daily as needed for allergies or rhinitis.    . hydrochlorothiazide (HYDRODIURIL) 25 MG tablet Take 25 mg by mouth daily.     Marland Kitchen losartan (COZAAR) 100 MG tablet Take 50 mg by mouth daily.    . Misc Natural Products (OSTEO BI-FLEX ADV TRIPLE ST) TABS Take 1 tablet by mouth daily.    . nitroGLYCERIN (NITROSTAT) 0.4 MG SL tablet Place 1 tablet (0.4 mg total) under the tongue every 5 (five) minutes as needed for chest pain. 30 tablet 3  . Omega-3 Fatty Acids (FISH OIL) 1000 MG CAPS Take 2,000 mg by mouth 2 (two) times daily.     Marland Kitchen omeprazole (PRILOSEC) 20 MG capsule Take 20 mg by mouth 2 (two) times daily.     . potassium chloride SA (KLOR-CON) 20 MEQ tablet Take 1 tablet (20 mEq total) by mouth 2 (two) times daily. 60 tablet 3  . QUEtiapine (SEROQUEL) 100 MG tablet Take 25 mg by mouth at bedtime. Pt only takes 1/4 tab     . tamsulosin (FLOMAX) 0.4 MG CAPS capsule Take 0.4 mg by mouth daily after breakfast.     No current facility-administered medications for this visit.        Allergies  Allergen Reactions  . Terazosin Rash    Rash when in sun  . Enalapril Maleate Rash  . Lisinopril Rash and Itching  . Penicillins Itching and  Rash    35 years ago    Review of Systems  Constitutional: Negative for activity change and fatigue.  HENT: Negative.  Eyes: Negative.  Respiratory: Negative. Negative for chest tightness and shortness of breath.  Cardiovascular: Negative for chest pain, palpitations and leg swelling.  Gastrointestinal: Negative.  Endocrine: Negative.  Genitourinary: Negative.  Musculoskeletal: Negative.  Allergic/Immunologic: Negative.  Neurological: Negative for dizziness and syncope.  Hematological: Negative.  Psychiatric/Behavioral:  History of PTSD   BP (!) 170/90  Pulse (!) 55  Temp 97.7 F (36.5 C) (Skin)  Resp 20  Ht 5' 11"  (1.803 m)  Wt 174 lb (78.9 kg)  SpO2 97% Comment: RA  BMI 24.27 kg/m  Physical Exam  Constitutional:  Appearance: Normal appearance. He is normal weight.  HENT:  Head: Normocephalic and atraumatic.  Eyes:  Extraocular Movements: Extraocular movements intact.  Conjunctiva/sclera: Conjunctivae normal.  Pupils: Pupils are equal, round, and reactive to light.  Neck:  Vascular: No carotid bruit.  Cardiovascular:  Rate and Rhythm: Normal rate and regular rhythm.  Pulses: Normal pulses.  Heart sounds: Normal heart sounds. No murmur heard.   Pulmonary:  Effort: Pulmonary effort is normal.  Breath sounds: Normal breath sounds.  Abdominal:  General: Abdomen is flat.  Palpations: Abdomen is soft.  Tenderness: There is no abdominal tenderness.  Musculoskeletal:  General: No swelling.  Cervical back: Normal range of motion and neck supple.  Lymphadenopathy:  Cervical: No cervical adenopathy.  Skin:  General: Skin is dry.  Neurological:  General: No focal deficit present.  Mental Status: He is alert and oriented to person, place, and time.  Psychiatric:  Mood and Affect: Mood normal.  Behavior: Behavior normal.   Diagnostic Tests:  ECHOCARDIOGRAM REPORT     Patient Name: CAILEB RHUE Date of Exam: 04/23/2020  Medical Rec #: [id number] Height: 71.0 in   Accession #: [id number] Weight: 189.0 lb  Date of Birth: 04-16-1946 BSA: 2.058 m  Patient Age: 83 years BP: 138/88 mmHg  Patient Gender: M HR: 69 bpm.  Exam Location: [place]   Procedure: 2D Echo, 3D Echo, Cardiac Doppler and Color Doppler   Indications: I48 Atrial fibrillation   History: Patient has prior history of Echocardiogram examinations,  most  recent 05/15/2018. Arrythmias:Atrial Fibrillation and  Palpitation,  Signs/Symptoms:Shortness of Breath; Risk  Factors:Hypertension  and Former Smoker. ETOH.   Sonographer: Jessee Avers, RDCS  Referring Phys: [place]    1. Left ventricular ejection fraction, by estimation, is 55 to 60%. The  left ventricle has normal function. The left ventricle has no regional  wall motion abnormalities. There is mild concentric left ventricular  hypertrophy. Left ventricular diastolic  parameters are indeterminate.  2. Right ventricular systolic function is normal. The right ventricular  size is normal. There is normal pulmonary artery systolic pressure.  3. Left atrial size was severely dilated.  4. The mitral valve is normal in structure. No evidence of mitral valve  regurgitation. No evidence of mitral stenosis.  5. The aortic valve is normal in structure. Aortic valve regurgitation is  not visualized. No aortic stenosis is present.  6. Aortic dilatation noted. Aneurysm of the ascending aorta, measuring 40  mm. There is mild dilatation of the aortic root measuring 39 mm.  7. The inferior vena cava is dilated in size with <50% respiratory  variability, suggesting right atrial pressure of 15 mmHg.   Comparison(s): 05/15/18 EF 60-65%.   FINDINGS  Left Ventricle: Left ventricular ejection fraction, by estimation, is 55  to 60%. The left ventricle has normal function. The left ventricle has no  regional wall motion abnormalities. The left ventricular internal cavity  size was normal in size. There is  mild  concentric left ventricular hypertrophy. Left ventricular diastolic  parameters are indeterminate.   Right Ventricle:  The right ventricular size is normal. No increase in  right ventricular wall thickness. Right ventricular systolic function is  normal. There is normal pulmonary artery systolic pressure. The tricuspid  regurgitant velocity is 2.42 m/s, and  with an assumed right atrial pressure of 3 mmHg, the estimated right  ventricular systolic pressure is 38.1 mmHg.   Left Atrium: Left atrial size was severely dilated.   Right Atrium: Right atrial size was normal in size.   Pericardium: There is no evidence of pericardial effusion.   Mitral Valve: The mitral valve is normal in structure. Normal mobility of  the mitral valve leaflets. No evidence of mitral valve regurgitation. No  evidence of mitral valve stenosis.   Tricuspid Valve: The tricuspid valve is normal in structure. Tricuspid  valve regurgitation is mild . No evidence of tricuspid stenosis.   Aortic Valve: The aortic valve is normal in structure.. There is mild  thickening and mild calcification of the aortic valve. Aortic valve  regurgitation is not visualized. No aortic stenosis is present. There is  mild thickening of the aortic valve. There  is mild calcification of the aortic valve.   Pulmonic Valve: The pulmonic valve was normal in structure. Pulmonic valve  regurgitation is trivial. No evidence of pulmonic stenosis.   Aorta: Aortic dilatation noted. 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   Venous: The inferior vena cava is dilated in size with less than 50%  respiratory variability, suggesting right atrial pressure of 15 mmHg.   IAS/Shunts: No atrial level shunt detected by color flow Doppler.    LEFT VENTRICLE  PLAX 2D  LVIDd: 4.70 cm  LVIDs: 3.20 cm  LV PW: 1.30 cm  LV IVS: 1.30 cm  LVOT diam:  2.30 cm 3D Volume EF:  LV SV: 56 3D EF: 53 %  LV SV Index: 27 LV EDV: 126 ml  LVOT Area: 4.15 cm LV ESV: 60 ml  LV SV: 66 ml   RIGHT VENTRICLE  RV Basal diam: 3.90 cm  RV S prime: 6.58 cm/s  TAPSE (M-mode): 1.8 cm  RVSP: 26.4 mmHg   LEFT ATRIUM Index RIGHT ATRIUM Index  LA diam: 4.50 cm 2.19 cm/m RA Pressure: 3.00 mmHg  LA Vol (A2C): 89.2 ml 43.33 ml/m RA Area: 20.50 cm  LA Vol (A4C): 98.1 ml 47.66 ml/m RA Volume: 57.50 ml 27.93 ml/m  LA Biplane Vol: 94.9 ml 46.10 ml/m  AORTIC VALVE  LVOT Vmax: 65.60 cm/s  LVOT Vmean: 45.400 cm/s  LVOT VTI: 0.134 m   AORTA  Ao Root diam: 3.90 cm  Ao Asc diam: 4.00 cm   TRICUSPID VALVE  TR Peak grad: 23.4 mmHg  TR Vmax: 242.00 cm/s  Estimated RAP: 3.00 mmHg  RVSP: 26.4 mmHg   SHUNTS  Systemic VTI: 0.13 m  Systemic Diam: 2.30 cm   Skeet Latch MD  Electronically signed by Skeet Latch MD  Signature Date/Time: 04/23/2020/12:52:08 PM    Physicians  Panel Physicians Referring Physician Case Authorizing Physician  Martinique, Peter M, MD (Primary)    Procedures  LEFT HEART CATH AND CORONARY ANGIOGRAPHY  Conclusion  Mid LM to Dist LM lesion is 50% stenosed.  Prox LAD to Mid LAD lesion is 90% stenosed.  Ramus lesion is 90% stenosed.  Ost Cx to Prox Cx lesion is 70% stenosed.  Mid Cx lesion is 50% stenosed.  Prox RCA lesion is 30% stenosed.  The left ventricular systolic function is normal.  LV end diastolic pressure is normal.  The left ventricular ejection fraction is 55-65% by visual estimate. 1. 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  2. Normal LV function  3. Normal LVEDP  Plan: given complexity of disease I would recommend CT surgery referral to consider CABG and MAZE procedure. OK to resume Eliquis tonight.  Recommendations  Antiplatelet/Anticoag Recommend to resume Apixaban, at currently prescribed dose and frequency on 08/07/2020. Concurrent antiplatelet therapy not recommended.   Indications  Coronary artery disease of native artery of native heart with stable angina pectoris ([place]) [W65.681 (ICD-10-CM)]  Procedural Details  Technical Details Indication: 74 yo WM with AFib and abnormal coronary CTA  Procedural Details: The right wrist was prepped, draped, and anesthetized with 1% lidocaine. Using the modified Seldinger technique, a 6 French slender sheath was introduced into the right radial artery. 3 mg of verapamil was administered through the sheath, weight-based unfractionated heparin was administered intravenously. Standard Judkins catheters were used for selective coronary angiography and left ventriculography. Catheter exchanges were performed over an exchange length guidewire. There were no immediate procedural complications. A TR band was used for radial hemostasis at the completion of the procedure. The patient was transferred to the post catheterization recovery area for further monitoring. Contrast: 55 cc Estimated blood loss <50 mL.   During this procedure medications were administered to achieve and maintain moderate conscious sedation while the patient's heart rate, blood pressure, and oxygen saturation were continuously monitored and I was present face-to-face 100% of this time.  Medications  (Filter: Administrations occurring from 2751 to 1208 on 08/07/20)  (important) Continuous medications are totaled by the amount administered until 08/07/20 1208.  fentaNYL (SUBLIMAZE) injection (mcg)  Total dose: 25 mcg  Date/Time  Rate/Dose/Volume Action  08/07/20 1135  25 mcg Given  midazolam (VERSED) injection (mg)  Total dose: 1 mg  Date/Time  Rate/Dose/Volume Action  08/07/20 1135  1 mg Given  Heparin (Porcine) in NaCl 1000-0.9 UT/500ML-% SOLN (mL)  Total volume: 1,500 mL  Date/Time  Rate/Dose/Volume Action  08/07/20 1136  500 mL Given  1136  500 mL Given  1149  500 mL Given  lidocaine (PF) (XYLOCAINE) 1 % injection (mL)  Total volume: 2 mL  Date/Time   Rate/Dose/Volume Action  08/07/20 1141  2 mL Given  Radial Cocktail/Verapamil only (mL)  Total volume: 10 mL  Date/Time  Rate/Dose/Volume Action  08/07/20 1141  10 mL Given  heparin sodium (porcine) injection (Units)  Total dose: 4,000 Units  Date/Time  Rate/Dose/Volume Action  08/07/20 1143  4,000 Units Given  iohexol (OMNIPAQUE) 350 MG/ML injection (mL)  Total volume: 55 mL  Date/Time  Rate/Dose/Volume Action  08/07/20 1156  55 mL Given  Sedation Time  Sedation Time Physician-1: 22 minutes 49 seconds  Contrast  Medication Name Total Dose  iohexol (OMNIPAQUE) 350 MG/ML injection 55 mL  Radiation/Fluoro  Fluoro time: 3 (min)  DAP: 14.8 (Gycm2)  Cumulative Air Kerma: 700.1 (mGy)  Complications  Complications documented before study signed (08/07/2020 74:94 PM)   No complications were associated with this study.  Documented by Martinique, Peter M, MD - 08/07/2020 12:09 PM  Coronary Findings  Diagnostic  Dominance: Right  Left Main  Mid LM to Dist LM lesion is 50% stenosed.  Left Anterior Descending  Prox LAD to Mid LAD lesion is 90% stenosed. The lesion is segmental with right-to-left collateral flow. The lesion is severely calcified.  Ramus Intermedius  Ramus lesion  is 90% stenosed.  Left Circumflex  Ost Cx to Prox Cx lesion is 70% stenosed.  Mid Cx lesion is 50% stenosed.  Right Coronary Artery  Vessel is large. The vessel is severely calcified.  Prox RCA lesion is 30% stenosed.  Intervention  No interventions have been documented.  ADDENDUM REPORT: 07/28/2020 16:32  HISTORY:  CAD screening, 60yrCHD risk 10-20%, treadmill candidate  EXAM:  Cardiac/Coronary CT  TECHNIQUE:  The patient was scanned on a [employer]  PROTOCOL:  A 120 kV prospective scan was triggered in the descending thoracic  aorta at 111 HU's. Axial non-contrast 3 mm slices were carried out  through the heart. The data set was analyzed on a dedicated work  station and scored using the  AC[employer] Gantry rotation speed  was 250 msecs and collimation was 0.6 mm. Beta blockade and 0.8 mg  of sl NTG was given. The 3D data set was reconstructed in 5%  intervals of 35-75% of the R-R cycle. Diastolic phases were analyzed  on a dedicated work station using MPR, MIP and VRT modes. The  patient received 864mOMNIPAQUE IOHEXOL 350 MG/ML SOLN of contrast.  FINDINGS:  Coronary calcium score: The patient's coronary artery calcium score  is 3159, which places the patient in the 95 percentile.  Coronary arteries: Normal coronary origins. Right dominance.  Right Coronary Artery: Large caliber vessel, gives rise to PDA and  PLV. There is diffuse eccentric calcified plaque throughout the RCA,  with maximum 25-49% stenosis in the proximal RCA but 50-69% stenosis  with circumferential calcium in the mid RCA. There is 50-69%  stenosis with calcified plaque in the distal RCA at the takeoff of  the PDA.  Left Main Coronary Artery: Normal caliber vessel. Calcified plaque  with at least 24-49% distal LM stenosis, cannot exclude higher  degree given amount of calcium. There is a very small caliber ramus,  appears to have minimal flow.  Left Anterior Descending Coronary Artery: Normal caliber vessel.  Long portion of calcified plaque throughout proximal LAD, started at  left main bifurcation. Maximum stenosis at least 50-69%, but  difficult to fully visualize given large amounts of calcium. There  is a portion of the proximal LAD, before the takeoff of D1, that has  near circumferential calcified plaque with suspicion of >70%  stenosis. Total length of calcified plaque nearly 40 mm. Gives rise  to 2 diagonal branches.  Left Circumflex Artery: Small, nondominant vessel. There is ostial  calcified plaque with 1-24% stenosis. There is focal calcified  plaque in the mid LCx with 25-49% stenosis.  Aorta: Normal size, 37 mm at the mid ascending aorta (level of the  PA bifurcation) measured  double oblique. Scattered calcifications.  No dissection.  Aortic Valve: Trivial calcifications. Trileaflet.  Other findings:  Normal pulmonary vein drainage into the left atrium.  Normal left atrial appendage without a thrombus.  Normal size of the pulmonary artery.  IMPRESSION:  1. At least moderate and possibly severe CAD, CADRADS = 3-4. CT FFR  will be performed and reported separately. Calcified plaque  diffusely noted, most prominent in distal left main, proximal LAD,  and proximal/mid RCA.  2. Coronary calcium score of 3159. This was 95th percentile for age  and sex matched control.  3. Normal coronary origin with right dominance.  Electronically Signed  By: BrBuford Dresser.D.  On: 07/28/2020 16:32   Addended by ChBuford DresserMD on 07/28/2020 4:34 PM  Study Result  Addenda  ADDENDUM REPORT:  07/28/2020 16:32  HISTORY:  CAD screening, 63yrCHD risk 10-20%, treadmill candidate  EXAM:  Cardiac/Coronary CT  TECHNIQUE:  The patient was scanned on a [employer]  PROTOCOL:  A 120 kV prospective scan was triggered in the descending thoracic  aorta at 111 HU's. Axial non-contrast 3 mm slices were carried out  through the heart. The data set was analyzed on a dedicated work  station and scored using the AL[employer] Gantry rotation speed  was 250 msecs and collimation was 0.6 mm. Beta blockade and 0.8 mg  of sl NTG was given. The 3D data set was reconstructed in 5%  intervals of 35-75% of the R-R cycle. Diastolic phases were analyzed  on a dedicated work station using MPR, MIP and VRT modes. The  patient received 855mOMNIPAQUE IOHEXOL 350 MG/ML SOLN of contrast.  FINDINGS:  Coronary calcium score: The patient's coronary artery calcium score  is 3159, which places the patient in the 95 percentile.  Coronary arteries: Normal coronary origins. Right dominance.  Right Coronary Artery: Large caliber vessel, gives rise to PDA and  PLV. There is diffuse  eccentric calcified plaque throughout the RCA,  with maximum 25-49% stenosis in the proximal RCA but 50-69% stenosis  with circumferential calcium in the mid RCA. There is 50-69%  stenosis with calcified plaque in the distal RCA at the takeoff of  the PDA.  Left Main Coronary Artery: Normal caliber vessel. Calcified plaque  with at least 24-49% distal LM stenosis, cannot exclude higher  degree given amount of calcium. There is a very small caliber ramus,  appears to have minimal flow.  Left Anterior Descending Coronary Artery: Normal caliber vessel.  Long portion of calcified plaque throughout proximal LAD, started at  left main bifurcation. Maximum stenosis at least 50-69%, but  difficult to fully visualize given large amounts of calcium. There  is a portion of the proximal LAD, before the takeoff of D1, that has  near circumferential calcified plaque with suspicion of >70%  stenosis. Total length of calcified plaque nearly 40 mm. Gives rise  to 2 diagonal branches.  Left Circumflex Artery: Small, nondominant vessel. There is ostial  calcified plaque with 1-24% stenosis. There is focal calcified  plaque in the mid LCx with 25-49% stenosis.  Aorta: Normal size, 37 mm at the mid ascending aorta (level of the  PA bifurcation) measured double oblique. Scattered calcifications.  No dissection.  Aortic Valve: Trivial calcifications. Trileaflet.  Other findings:  Normal pulmonary vein drainage into the left atrium.  Normal left atrial appendage without a thrombus.  Normal size of the pulmonary artery.  IMPRESSION:  1. At least moderate and possibly severe CAD, CADRADS = 3-4. CT FFR  will be performed and reported separately. Calcified plaque  diffusely noted, most prominent in distal left main, proximal LAD,  and proximal/mid RCA.  2. Coronary calcium score of 3159. This was 95th percentile for age  and sex matched control.  3. Normal coronary origin with right dominance.   Electronically Signed  By: BrBuford Dresser.D.  On: 07/28/2020 16:32   Signed by ChBuford DresserMD on 07/28/2020 4:34 PM  Narrative & Impression  EXAM:  OVER-READ INTERPRETATION CT CHEST  The following report is an over-read performed by radiologist Dr.  KyAbigail Miyamotof [facility] [place] 07/21/2020. This over-read  does not include interpretation of cardiac or coronary anatomy or  pathology. The coronary CTA interpretation by the cardiologist is  attached.  COMPARISON: Chest radiograph 01/07/2019  FINDINGS:  Vascular: Aortic atherosclerosis. Tortuous thoracic aorta. No  central pulmonary embolism, on this non-dedicated study.  Mediastinum/Nodes: No imaged thoracic adenopathy. Tiny hiatal  hernia.  Lungs/Pleura: Trace bilateral pleural fluid.  Mild centrilobular emphysema.  Upper Abdomen: Normal imaged portions of the liver, spleen  Musculoskeletal: Remote right seventh rib fracture, incompletely  imaged.  IMPRESSION:  1. No acute findings in the imaged extracardiac chest.  2. Trace bilateral pleural fluid.  3. Aortic Atherosclerosis (ICD10-I70.0) and Emphysema (ICD10-J43.9).  Electronically Signed:  By: Abigail Miyamoto M.D.  On: 07/21/2020 10:14   CT FFR ANALYSIS  CLINICAL DATA: CAD screening, 43yrCHD risk 10-20%, treadmill  candidate  FINDINGS:  FFRct analysis was performed on the original cardiac CT angiogram  dataset. Diagrammatic representation of the FFRct analysis is  provided in a separate PDF document in PACS. This dictation was  created using the PDF document and an interactive 3D model of the  results. 3D model is not available in the EMR/PACS. Normal FFR range  is >0.80.  1. Left Main: No significant stenosis. FFR = 0.89  2. LAD: Significant stenosis. FFR 0.76 at takeoff of first diagonal,  Mid FFR = 0.66, Distal FFR = 0.58  3. LCX: No significant stenosis. Proximal FFR = 0.86  4. RCA: Unable to be interpreted due to artifact.  IMPRESSION:   1. CT FFR analysis showed significant stenosis in the LAD as noted,  beginning near the takeoff of the first significant diagonal and  extending distally.  Electronically Signed  By: BBuford DresserM.D.  On: 07/29/2020 17:50    Impression:   This 74year old gentleman has moderate calcified left main and severe two-vessel coronary disease involving the LAD and left circumflex systems. Cardiac CT FFR shows significant ischemia in the LAD distribution. He also has a history of paroxysmal to persistent atrial fibrillation but now is required flecainide to maintain sinus rhythm after cardioversion. I agree that coronary bypass graft surgery is the best treatment event ischemia and infarction. I also think he should have a Maze procedure to maximize the potential to maintain sinus rhythm without antiarrhythmic therapy. I reviewed the cardiac cath images with the patient and his wife and answered their questions. I discussed the operative procedure with the patient and his wife including alternatives, benefits and risks; including but not limited to bleeding, blood transfusion, infection, stroke, myocardial infarction, graft failure, heart block requiring a permanent pacemaker, organ dysfunction, and death. CWeston Annaunderstands and agrees to proceed.   Plan:   Coronary artery bypass graft surgery and Maze procedure.

## 2020-08-26 NOTE — Op Note (Signed)
CARDIOVASCULAR SURGERY OPERATIVE NOTE  08/26/2020  Surgeon:  Gaye Pollack, MD  First Assistant: Enid Cutter, P-AC   Preoperative Diagnosis:  Severe multi-vessel coronary artery disease, paroxysmal atrial fibrillation   Postoperative Diagnosis:  Same   Procedure:  1. Median Sternotomy 2. Extracorporeal circulation 3.   Coronary artery bypass grafting x 4   Left internal mammary artery graft to the LAD  SVG to diagonal  SVG to Ramus  SVG to OM 4.   Endoscopic vein harvest from the right leg 5.   Bi-atrial MAZE IV using RF and cryothermy 6.   Clipping of left atrial appendage  Anesthesia:  General Endotracheal   Clinical History/Surgical Indication:  This 74 year old gentleman has moderate calcified left main and severe two-vessel coronary disease involving the LAD and left circumflex systems. Cardiac CT FFR shows significant ischemia in the LAD distribution. He also has a history of paroxysmal to persistent atrial fibrillation but now is required flecainide to maintain sinus rhythm after cardioversion. I agree that coronary bypass graft surgery is the best treatment event ischemia and infarction. I also think he should have a Maze procedure to maximize the potential to maintain sinus rhythm without antiarrhythmic therapy. I reviewed the cardiac cath images with the patient and his wife and answered their questions. I discussed the operative procedure with the patient and his wife including alternatives, benefits and risks; including but not limited to bleeding, blood transfusion, infection, stroke, myocardial infarction, graft failure, heart block requiring a permanent pacemaker, organ dysfunction, and death. Joseph Larsen understands and agrees to proceed.   Preparation:  The patient was seen in the preoperative holding area and the correct patient, correct operation were  confirmed with the patient after reviewing the medical record and catheterization. The consent was signed by me. Preoperative antibiotics were given. A pulmonary arterial line and radial arterial line were placed by the anesthesia team. The patient was taken back to the operating room and positioned supine on the operating room table. After being placed under general endotracheal anesthesia by the anesthesia team a foley catheter was placed. The neck, chest, abdomen, and both legs were prepped with betadine soap and solution and draped in the usual sterile manner. A surgical time-out was taken and the correct patient and operative procedure were confirmed with the nursing and anesthesia staff.   Cardiopulmonary Bypass:  A median sternotomy was performed. The pericardium was opened in the midline. Right ventricular function appeared normal. The ascending aorta was of normal size and had no palpable plaque. There were no contraindications to aortic cannulation or cross-clamping. The patient was fully systemically heparinized and the ACT was maintained > 400 sec. The proximal aortic arch was cannulated with a 20 F aortic cannula for arterial inflow. Venous cannulation was performed via the right atrial appendage using a two-staged venous cannula. An antegrade cardioplegia/vent cannula was inserted into the mid-ascending aorta. Aortic occlusion was performed with a single cross-clamp. Systemic cooling to 32 degrees Centigrade and topical cooling of the heart with iced saline were used. Hyperkalemic antegrade cold blood cardioplegia was used to induce diastolic arrest and was then given at about 20 minute intervals throughout the period of arrest to maintain myocardial temperature at or below 10 degrees centigrade. A temperature probe was inserted into the interventricular septum and an insulating pad was placed in the pericardium.   Bi-atrial Maze IV:  The left pulmonary veins were circled with a tape. An  Atricure bipolar RF clamp was used to create a lesion  around the pulmonary veins on the left atrial wall. Three activations of RF energy were applied. The base of the LAA appendage was measured and a 40 mm Atricure open ended clip was applied to the base of the appendage to isolate it. The left atrium was opened by a vertical incision in the interatrial groove. An encircling lesion was then created around the right pulmonary veins using the atriotomy incision anteriorly and a cryo lesion posteriorly. All cryo lesions were for three minutes since the atrial wall was thick. Then cryo lesions were created to join the pulmonary vein lesions superiorly and inferiorly creating a posterior box.  A cryo lesion was placed between the inferior lesion that joined the pulmonary veins and the posterior mitral annulus at P2-3. A lesion was placed externally across the coronary sinus overlapping this. The left atrium was closed with two layers of continuous 3-0 prolene suture. The right atrium was opened obliquely  An RF lesion was created to join the SVC and IVC posteriorly along the interatrial septum. These lesions were two firings and two applications for each lesion.  A cryo lesion was placed from the anterior aspect of this oblique atriotomy down to the tricuspid annulus. An RF lesion was placed obliquely across the RAA. The right atriotomy was then closed with two layers of 4-0 prolene suture.    Left internal mammary artery harvest:  The left side of the sternum was retracted using the Rultract retractor. The left internal mammary artery was harvested as a pedicle graft. All side branches were clipped. It was a medium-sized vessel of good quality with excellent blood flow. It was ligated distally and divided. It was sprayed with topical papaverine solution to prevent vasospasm.   Endoscopic vein harvest:  The right greater saphenous vein was harvested endoscopically through a 2 cm incision medial to the right  knee. It was harvested from the upper thigh to below the knee. It was a medium-sized vein of good quality. The side branches were all ligated with 4-0 silk ties.    Coronary arteries:  The coronary arteries were examined.   LAD:  Large vessel with calcified proximal disease but mid to distal vessel had no significant disease. Diagonal medium caliber vessel with no distal disease.  LCX:  Ramus intramyocardial but medium caliber with no distal disease. OM medium caliber proximally with no distal disease.   RCA:  Segmental plaque throughout but no stenosis on cath.    Grafts:  1. LIMA to the LAD: 2.5 mm. It was sewn end to side using 8-0 prolene continuous suture. 2. SVG to diagonal:  1.6 mm. It was sewn end to side using 7-0 prolene continuous suture. 3. SVG to Ramus:  1.6 mm. It was sewn end to side using 7-0 prolene continuous suture. 4. SVG to OM:  1.6 mm. It was sewn end to side using 7-0 prolene continuous suture.  The proximal vein graft anastomoses were performed to the mid-ascending aorta using continuous 6-0 prolene suture. Graft markers were placed around the proximal anastomoses.   Completion:  The patient was rewarmed to 37 degrees Centigrade. The clamp was removed from the LIMA pedicle and there was rapid warming of the septum and return of ventricular fibrillation. The crossclamp was removed with a time of 173 minutes. There was spontaneous return of sinus rhythm. The distal and proximal anastomoses were checked for hemostasis. The position of the grafts was satisfactory. Two temporary epicardial pacing wires were placed on the right atrium and two on  the right ventricle. The patient was weaned from CPB without difficulty on no inotropes. CPB time was 199 minutes. Cardiac output was 6 LPM. TEE showed normal LV systolic function. Heparin was fully reversed with protamine and the aortic and venous cannulas removed. Hemostasis was achieved. Mediastinal and left pleural drainage  tubes were placed. The sternum was closed with  #6 stainless steel wires. The fascia was closed with continuous # 1 vicryl suture. The subcutaneous tissue was closed with 2-0 vicryl continuous suture. The skin was closed with 3-0 vicryl subcuticular suture. All sponge, needle, and instrument counts were reported correct at the end of the case. Dry sterile dressings were placed over the incisions and around the chest tubes which were connected to pleurevac suction. The patient was then transported to the surgical intensive care unit in stable condition.

## 2020-08-26 NOTE — Plan of Care (Signed)

## 2020-08-26 NOTE — Anesthesia Preprocedure Evaluation (Signed)
Anesthesia Evaluation  Patient identified by MRN, date of birth, ID band Patient awake    Reviewed: Allergy & Precautions, NPO status , Patient's Chart, lab work & pertinent test results  Airway Mallampati: II  TM Distance: >3 FB Neck ROM: Full    Dental no notable dental hx.    Pulmonary neg pulmonary ROS, former smoker,    Pulmonary exam normal breath sounds clear to auscultation       Cardiovascular hypertension, + CAD  Normal cardiovascular exam+ dysrhythmias Atrial Fibrillation  Rhythm:Regular Rate:Normal  Left Ventricle: Left ventricular ejection fraction, by estimation, is 55  to 60%. The left ventricle has normal function. The left ventricle has no  regional wall motion abnormalities. The left ventricular internal cavity  size was normal in size. There is  mild concentric left ventricular hypertrophy. Left ventricular diastolic  parameters are indeterminate.   Right Ventricle: The right ventricular size is normal. No increase in  right ventricular wall thickness. Right ventricular systolic function is  normal. There is normal pulmonary artery systolic pressure. The tricuspid  regurgitant velocity is 2.42 m/s, and  with an assumed right atrial pressure of 3 mmHg, the estimated right  ventricular systolic pressure is 62.2 mmHg.   Left Atrium: Left atrial size was severely dilated.   Right Atrium: Right atrial size was normal in size.   Pericardium: There is no evidence of pericardial effusion.   Mitral Valve: The mitral valve is normal in structure. Normal mobility of  the mitral valve leaflets. No evidence of mitral valve regurgitation. No  evidence of mitral valve stenosis.   Tricuspid Valve: The tricuspid valve is normal in structure. Tricuspid  valve regurgitation is mild . No evidence of tricuspid stenosis.   Aortic Valve: The aortic valve is normal in structure.. There is mild  thickening and mild  calcification of the aortic valve. Aortic valve  regurgitation is not visualized. No aortic stenosis is present. There is  mild thickening of the aortic valve. There  is mild calcification of the aortic valve.   Pulmonic Valve: The pulmonic valve was normal in structure. Pulmonic valve  regurgitation is trivial. No evidence of pulmonic stenosis.   Aorta: Aortic dilatation noted. 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    Neuro/Psych negative neurological ROS  negative psych ROS   GI/Hepatic Neg liver ROS, GERD  ,  Endo/Other  negative endocrine ROS  Renal/GU negative Renal ROS  negative genitourinary   Musculoskeletal negative musculoskeletal ROS (+)   Abdominal   Peds negative pediatric ROS (+)  Hematology negative hematology ROS (+)   Anesthesia Other Findings   Reproductive/Obstetrics negative OB ROS                             Anesthesia Physical Anesthesia Plan  ASA: III  Anesthesia Plan: General   Post-op Pain Management:    Induction: Intravenous  PONV Risk Score and Plan: 2 and Ondansetron, Dexamethasone and Treatment may vary due to age or medical condition  Airway Management Planned: Oral ETT  Additional Equipment: Arterial line, CVP, PA Cath, TEE and Ultrasound Guidance Line Placement  Intra-op Plan:   Post-operative Plan: Post-operative intubation/ventilation  Informed Consent: I have reviewed the patients History and Physical, chart, labs and discussed the procedure including the risks, benefits and alternatives for the proposed anesthesia with the patient or  authorized representative who has indicated his/her understanding and acceptance.     Dental advisory given  Plan Discussed with: CRNA and Surgeon  Anesthesia Plan Comments:         Anesthesia Quick Evaluation

## 2020-08-26 NOTE — Anesthesia Procedure Notes (Signed)
Central Venous Catheter Insertion Performed by: Myrtie Soman, MD, anesthesiologist Start/End9/15/2021 8:05 AM, 08/26/2020 8:21 AM Patient location: Pre-op. Preanesthetic checklist: patient identified, IV checked, site marked, risks and benefits discussed, surgical consent, monitors and equipment checked, pre-op evaluation, timeout performed and anesthesia consent Position: Trendelenburg Lidocaine 1% used for infiltration and patient sedated Hand hygiene performed , maximum sterile barriers used  and Seldinger technique used Catheter size: 8.5 Fr Total catheter length 10. PA cath was placed.Sheath introducer Swan type:thermodilution PA Cath depth:45 Procedure performed without using ultrasound guided technique. Ultrasound Notes:anatomy identified, needle tip was noted to be adjacent to the nerve/plexus identified, no ultrasound evidence of intravascular and/or intraneural injection and image(s) printed for medical record Attempts: 1 Following insertion, line sutured, dressing applied and Biopatch. Post procedure assessment: blood return through all ports, free fluid flow and no air  Patient tolerated the procedure well with no immediate complications.

## 2020-08-26 NOTE — Progress Notes (Signed)
RWP started at 2018 by RT Abigail Butts. Pt now on 40/4.  Will continue to monitor.

## 2020-08-26 NOTE — Progress Notes (Signed)
ABG within parameters, RT notified of results. Awaiting RT to return to bedside for mechanics.

## 2020-08-26 NOTE — Anesthesia Procedure Notes (Addendum)
Procedure Name: Intubation Date/Time: 08/26/2020 9:57 AM Performed by: Janace Litten, CRNA Pre-anesthesia Checklist: Patient identified, Emergency Drugs available, Suction available and Patient being monitored Patient Re-evaluated:Patient Re-evaluated prior to induction Oxygen Delivery Method: Circle System Utilized Preoxygenation: Pre-oxygenation with 100% oxygen Induction Type: IV induction Ventilation: Mask ventilation without difficulty Laryngoscope Size: Glidescope and 4 Grade View: Grade I Tube type: Oral Tube size: 8.0 mm Number of attempts: 2 (Switched to glidescope from Mac 4 blade) Airway Equipment and Method: Stylet and Oral airway Placement Confirmation: ETT inserted through vocal cords under direct vision,  positive ETCO2 and breath sounds checked- equal and bilateral Secured at: 23 cm Tube secured with: Tape Dental Injury: Teeth and Oropharynx as per pre-operative assessment  Comments: Placed by G. Lovena Le SRNA: DLx1 with MAC 4, grade 3.5 view, unable to reposition view, DLx2 with glidescope 4, grade I view.

## 2020-08-26 NOTE — Anesthesia Procedure Notes (Signed)
Anesthesia Procedure Image    

## 2020-08-26 NOTE — Anesthesia Procedure Notes (Addendum)
Arterial Line Insertion Start/End9/15/2021 8:15 AM, 08/26/2020 8:25 AM Performed by: Janace Litten, CRNA  Patient location: Pre-op. Preanesthetic checklist: patient identified, IV checked, site marked, risks and benefits discussed, surgical consent, monitors and equipment checked, pre-op evaluation, timeout performed and anesthesia consent Lidocaine 1% used for infiltration Left, radial was placed Catheter size: 20 G Hand hygiene performed  and maximum sterile barriers used   Attempts: 1 Procedure performed without using ultrasound guided technique. Following insertion, dressing applied and Biopatch. Post procedure assessment: normal and unchanged  Patient tolerated the procedure well with no immediate complications. Additional procedure comments: Placed by G. Sonia Baller.

## 2020-08-26 NOTE — Anesthesia Postprocedure Evaluation (Signed)
Anesthesia Post Note  Patient: Joseph Larsen  Procedure(s) Performed: CORONARY ARTERY BYPASS GRAFTING TIMES FOUR USING LEFT INTERNAL MAMMARY ARTERY AND ENDOSCOPICALLY HARVESTED RIGHT GREATER SAPHENOUS VEIN (N/A Chest) MAZE (N/A Chest) TRANSESOPHAGEAL ECHOCARDIOGRAM (TEE) (N/A Esophagus) CLIPPING OF ATRIAL APPENDAGE (N/A Chest)     Patient location during evaluation: SICU Anesthesia Type: General Level of consciousness: sedated Pain management: pain level controlled Vital Signs Assessment: post-procedure vital signs reviewed and stable Respiratory status: patient remains intubated per anesthesia plan Cardiovascular status: stable Postop Assessment: no apparent nausea or vomiting Anesthetic complications: no   No complications documented.  Last Vitals:  Vitals:   08/26/20 0659 08/26/20 1626  BP: (!) 175/74 (!) 101/56  Pulse: 60 80  Resp: 19 12  Temp: 36.5 C   SpO2: 99% 100%    Last Pain:  Vitals:   08/26/20 0659  TempSrc: Temporal  PainSc: 0-No pain                 Milena Liggett S

## 2020-08-26 NOTE — Progress Notes (Signed)
  Echocardiogram Echocardiogram Transesophageal has been performed.  Joseph Larsen 08/26/2020, 11:01 AM

## 2020-08-27 ENCOUNTER — Encounter (HOSPITAL_COMMUNITY): Payer: Self-pay | Admitting: Surgery

## 2020-08-27 ENCOUNTER — Inpatient Hospital Stay (HOSPITAL_COMMUNITY): Payer: Medicare Other

## 2020-08-27 LAB — BASIC METABOLIC PANEL
Anion gap: 8 (ref 5–15)
Anion gap: 6 (ref 5–15)
BUN: 9 mg/dL (ref 8–23)
BUN: 9 mg/dL (ref 8–23)
CO2: 22 mmol/L (ref 22–32)
CO2: 26 mmol/L (ref 22–32)
Calcium: 7.4 mg/dL — ABNORMAL LOW (ref 8.9–10.3)
Calcium: 7.7 mg/dL — ABNORMAL LOW (ref 8.9–10.3)
Chloride: 107 mmol/L (ref 98–111)
Chloride: 105 mmol/L (ref 98–111)
Creatinine, Ser: 0.65 mg/dL (ref 0.61–1.24)
Creatinine, Ser: 0.67 mg/dL (ref 0.61–1.24)
GFR calc Af Amer: >60 mL/min (ref >60)
GFR calc Af Amer: >60 mL/min (ref >60)
GFR calc non Af Amer: >60 mL/min (ref >60)
GFR calc non Af Amer: >60 mL/min (ref >60)
Glucose, Bld: 122 mg/dL — ABNORMAL HIGH (ref 70–99)
Glucose, Bld: 125 mg/dL — ABNORMAL HIGH (ref 70–99)
Potassium: 3.6 mmol/L (ref 3.5–5.1)
Potassium: 3.5 mmol/L (ref 3.5–5.1)
Sodium: 137 mmol/L (ref 135–145)
Sodium: 137 mmol/L (ref 135–145)

## 2020-08-27 LAB — MAGNESIUM
Magnesium: 2.2 mg/dL (ref 1.7–2.4)
Magnesium: 2 mg/dL (ref 1.7–2.4)

## 2020-08-27 LAB — CBC
HCT: 33.9 % — ABNORMAL LOW (ref 39.0–52.0)
HCT: 33.6 % — ABNORMAL LOW (ref 39.0–52.0)
Hemoglobin: 11.5 g/dL — ABNORMAL LOW (ref 13.0–17.0)
Hemoglobin: 11.4 g/dL — ABNORMAL LOW (ref 13.0–17.0)
MCH: 32.5 pg (ref 26.0–34.0)
MCH: 33.8 pg (ref 26.0–34.0)
MCHC: 33.9 g/dL (ref 30.0–36.0)
MCHC: 33.9 g/dL (ref 30.0–36.0)
MCV: 95.8 fL (ref 80.0–100.0)
MCV: 99.7 fL (ref 80.0–100.0)
Platelets: 130 10*3/uL — ABNORMAL LOW (ref 150–400)
Platelets: 121 10*3/uL — ABNORMAL LOW (ref 150–400)
RBC: 3.54 MIL/uL — ABNORMAL LOW (ref 4.22–5.81)
RBC: 3.37 MIL/uL — ABNORMAL LOW (ref 4.22–5.81)
RDW: 13.7 % (ref 11.5–15.5)
RDW: 14.1 % (ref 11.5–15.5)
WBC: 13.2 10*3/uL — ABNORMAL HIGH (ref 4.0–10.5)
WBC: 11.5 10*3/uL — ABNORMAL HIGH (ref 4.0–10.5)
nRBC: 0 % (ref 0.0–0.2)
nRBC: 0 % (ref 0.0–0.2)

## 2020-08-27 LAB — GLUCOSE, CAPILLARY
Glucose-Capillary: 114 mg/dL — ABNORMAL HIGH (ref 70–99)
Glucose-Capillary: 129 mg/dL — ABNORMAL HIGH (ref 70–99)
Glucose-Capillary: 112 mg/dL — ABNORMAL HIGH (ref 70–99)
Glucose-Capillary: 106 mg/dL — ABNORMAL HIGH (ref 70–99)
Glucose-Capillary: 111 mg/dL — ABNORMAL HIGH (ref 70–99)

## 2020-08-27 MED ORDER — POTASSIUM CHLORIDE 10 MEQ/50ML IV SOLN
10.0000 meq | INTRAVENOUS | Status: AC
  Administered 2020-08-27 (×3): 10 meq via INTRAVENOUS
  Filled 2020-08-27 (×3): qty 50

## 2020-08-27 MED ORDER — ENOXAPARIN SODIUM 40 MG/0.4ML ~~LOC~~ SOLN
40.0000 mg | Freq: Every day | SUBCUTANEOUS | Status: DC
  Administered 2020-08-27 – 2020-08-30 (×4): 40 mg via SUBCUTANEOUS
  Filled 2020-08-27 (×4): qty 0.4

## 2020-08-27 MED ORDER — TRAMADOL HCL 50 MG PO TABS
50.0000 mg | ORAL_TABLET | ORAL | Status: DC | PRN
  Administered 2020-08-28: 50 mg via ORAL
  Filled 2020-08-27: qty 1

## 2020-08-27 MED ORDER — ALPRAZOLAM 0.25 MG PO TABS
0.2500 mg | ORAL_TABLET | Freq: Every day | ORAL | Status: DC
  Administered 2020-08-27 – 2020-08-30 (×4): 0.25 mg via ORAL
  Filled 2020-08-27 (×4): qty 1

## 2020-08-27 MED ORDER — FUROSEMIDE 10 MG/ML IJ SOLN
40.0000 mg | Freq: Two times a day (BID) | INTRAMUSCULAR | Status: AC
  Administered 2020-08-27 (×2): 40 mg via INTRAVENOUS
  Filled 2020-08-27 (×2): qty 4

## 2020-08-27 MED ORDER — QUETIAPINE FUMARATE 25 MG PO TABS
25.0000 mg | ORAL_TABLET | Freq: Every day | ORAL | Status: DC
  Administered 2020-08-27 – 2020-08-30 (×4): 25 mg via ORAL
  Filled 2020-08-27 (×4): qty 1

## 2020-08-27 MED ORDER — POTASSIUM CHLORIDE CRYS ER 20 MEQ PO TBCR
20.0000 meq | EXTENDED_RELEASE_TABLET | ORAL | Status: AC
  Administered 2020-08-27 – 2020-08-28 (×3): 20 meq via ORAL
  Filled 2020-08-27 (×2): qty 1

## 2020-08-27 MED ORDER — POTASSIUM CHLORIDE CRYS ER 20 MEQ PO TBCR
20.0000 meq | EXTENDED_RELEASE_TABLET | Freq: Two times a day (BID) | ORAL | Status: AC
  Administered 2020-08-27 (×2): 20 meq via ORAL
  Filled 2020-08-27 (×3): qty 1

## 2020-08-27 MED FILL — Heparin Sodium (Porcine) Inj 1000 Unit/ML: INTRAMUSCULAR | Qty: 30 | Status: AC

## 2020-08-27 MED FILL — Potassium Chloride Inj 2 mEq/ML: INTRAVENOUS | Qty: 40 | Status: AC

## 2020-08-27 MED FILL — Magnesium Sulfate Inj 50%: INTRAMUSCULAR | Qty: 10 | Status: AC

## 2020-08-27 MED FILL — Thrombin (Recombinant) For Soln 20000 Unit: CUTANEOUS | Qty: 1 | Status: AC

## 2020-08-27 NOTE — Hospital Course (Addendum)
  Coronary artery disease Persistent atrial fibrillation Dyslipidemia Hypertension History of PTSD History of anxiety and depression History of gastroesophageal reflux disease S/P CABG x 4 and MAZE procedure      History of Present Illness: The patient is a 74 year old gentleman with history of hypertension, hyperlipidemia, paroxysmal and now persistent atrial fibrillation who was maintaining normal sinus rhythm on flecainide after his most recent cardioversion. He had an exercise Myoview suggesting possible inferior scar and a coronary CTA showed a calcium score of 3159 with calcified plaque most prominently in the distal left main and proximal LAD as well as the proximal to mid RCA. FFR of the left main was 0.89 suggesting no significant stenosis. FFR in the LAD was 0.76 at the origin of the first diagonal and 0.66 in the midportion. Distally it was 0.58 suggesting significant LAD stenosis. The FFR of left circumflex was 0.86. It was not possible to interpret the RCA data due to artifact. He subsequently underwent cardiac catheterization on 08/07/2020 which showed 50% calcified left main stenosis. The proximal to mid LAD had 90% stenosis. The ramus had 90% stenosis. The ostial to proximal left circumflex had 70% stenosis. RCA had mild nonobstructive disease. Left ventricular systolic function was normal with normal LVEDP.  Coronary bypass grafting and an open MAZE procedure were offered to Joseph Larsen by Dr. Cyndia Bent and he decided to proceed with surgery.    Hospital Course: Joseph Larsen was admitted for elective surgery on 08/26/20 and prepared for surgery. Coronary artery bypass grafting was performed with the left internal mammary artery grafted to the left anterior descending coronary and saphenous veins grafted to the diagonal, remus intermediate, and obtuse marginal coronary arteries.  A bi-atrial MAZE procedure was also performed along with clipping of the left atrial appendage. Following the  procedure he separated from cardiopulmonary bypass without any difficulty on phenylephrine for BP support.  He was transferred to the ICU where he remained hemodynamically stable. He was weaned from mechanical vent support and extubated at approximately 10pm on the day of surgery. The chest tube drainage was minimal overnight so they were removed on the first post-op day along with the PA catheter and arterial line. He was mobilized with physical therapy. His Seroquel and Xanax used to treat his PTSD were resumed on the first post-op day.  Diuresis was initiated on the first post-op day and he responded well. He was transferred to [facility] on the 2nd post-operative day and was in SR at the time. His mobility continued to improve. He was weaned from the supplemental O2 without difficulty. He has been tolerating a diet and had a bowel movement. Epicardial pacing wires were removed on 09/18. Of note, he developed a rash across his chest and sides, likely from adhesive (tape and leads). He was given Hydrocortisone cream and Benadryl PRN, which helped some. His BP and HR improved and he was restarted on low dose Atenolol 12.5 mg daily as taken prior to surgery. He will be discharged on apixaban for his history of paroxysmal atrial fibrillation. Marland Kitchen

## 2020-08-27 NOTE — Progress Notes (Signed)
[facility] Evening Rounds  POD #1 s/p CABg Doing well; continues temp pacing BP (!) 88/58   Pulse 80   Temp 98.1 F (36.7 C) (Oral)   Resp 12   Ht 5\' 11"  (1.803 m)   Wt 84.7 kg   SpO2 96%   BMI 26.04 kg/m  No complaints PE unremarkable.  Intake/Output Summary (Last 24 hours) at 08/27/2020 2329 Last data filed at 08/27/2020 2000 Gross per 24 hour  Intake 1638.62 ml  Output 2080 ml  Net -441.38 ml    A/p: continue current management plan. Joseph Larsen Z. Orvan Seen, [place]

## 2020-08-27 NOTE — Progress Notes (Signed)
1 Day Post-Op Procedure(s) (LRB): CORONARY ARTERY BYPASS GRAFTING TIMES FOUR USING LEFT INTERNAL MAMMARY ARTERY AND ENDOSCOPICALLY HARVESTED RIGHT GREATER SAPHENOUS VEIN (N/A) MAZE (N/A) TRANSESOPHAGEAL ECHOCARDIOGRAM (TEE) (N/A) CLIPPING OF ATRIAL APPENDAGE (N/A) Subjective: No complaints  Objective: Vital signs in last 24 hours: Temp:  [96.6 F (35.9 C)-99.7 F (37.6 C)] 98.6 F (37 C) (09/16 0700) Pulse Rate:  [69-94] 94 (09/16 0700) Cardiac Rhythm: Atrial paced;Heart block (09/16 0000) Resp:  [9-24] 17 (09/16 0700) BP: (100-135)/(56-83) 104/74 (09/16 0400) SpO2:  [94 %-100 %] 96 % (09/16 0700) Arterial Line BP: (89-140)/(49-84) 102/56 (09/16 0700) FiO2 (%):  [40 %-50 %] 40 % (09/15 2042) Weight:  [84.7 kg] 84.7 kg (09/16 0500)  Hemodynamic parameters for last 24 hours: PAP: (12-58)/(5-46) 27/12 CO:  [2.7 L/min-5.1 L/min] 5.1 L/min CI:  [1.4 L/min/m2-2.6 L/min/m2] 2.6 L/min/m2  Intake/Output from previous day: 09/15 0701 - 09/16 0700 In: 5820.2 [P.O.:690; I.V.:2657.8; Blood:471; NG/GT:160; IV Piggyback:1841.4] Out: 4098 [Urine:3490; Emesis/NG output:50; Blood:657; Chest Tube:700] Intake/Output this shift: No intake/output data recorded.  General appearance: alert and cooperative Neurologic: intact Heart: regular rate and rhythm, S1, S2 normal, no murmur, click, rub or gallop Lungs: clear to auscultation bilaterally Extremities: edema mild Wound: dressings dry  Lab Results: Recent Labs    08/26/20 2150 08/26/20 2150 08/26/20 2305 08/27/20 0349  WBC 11.5*  --   --  13.2*  HGB 12.2*   < > 11.2* 11.5*  HCT 35.1*   < > 33.0* 33.9*  PLT 116*  --   --  130*   < > = values in this interval not displayed.   BMET:  Recent Labs    08/26/20 2150 08/26/20 2150 08/26/20 2305 08/27/20 0349  NA 134*   < > 140 137  K 3.6   < > 4.0 3.6  CL 105  --   --  107  CO2 20*  --   --  22  GLUCOSE 126*  --   --  122*  BUN 9  --   --  9  CREATININE 0.69  --   --  0.65   CALCIUM 7.0*  --   --  7.4*   < > = values in this interval not displayed.    PT/INR:  Recent Labs    08/26/20 1636  LABPROT 17.6*  INR 1.5*   ABG    Component Value Date/Time   PHART 7.384 08/26/2020 2305   HCO3 24.8 08/26/2020 2305   TCO2 26 08/26/2020 2305   ACIDBASEDEF 1.0 08/26/2020 2105   O2SAT 99.0 08/26/2020 2305   CBG (last 3)  Recent Labs    08/26/20 2308 08/27/20 0343 08/27/20 0631  GLUCAP 143* 114* 129*   CXR: clear  ECG sinus 64, 1st degree AV block  Assessment/Plan: S/P Procedure(s) (LRB): CORONARY ARTERY BYPASS GRAFTING TIMES FOUR USING LEFT INTERNAL MAMMARY ARTERY AND ENDOSCOPICALLY HARVESTED RIGHT GREATER SAPHENOUS VEIN (N/A) MAZE (N/A) TRANSESOPHAGEAL ECHOCARDIOGRAM (TEE) (N/A) CLIPPING OF ATRIAL APPENDAGE (N/A)  POD 1  Hemodynamically stable in sinus rhythm. Will continue atrial pacing at 80 for now since he just got off neo. Hold beta blocker until HR increases.   PAF, s/p MAZE: Resume Tenormin as HR increases. He was on Flecainide pre-cath but it was stopped due to plans for surgery and bradycardia. Plan to resume Apixaban at discharge.  Volume excess: start diuresis and KCL.  DC chest tubes, swan, arterial line.  Hx of PTSD: resume Seroquel and Xanax at hs.  IS, ambulation.  LOS: 1 day  Joseph Larsen 08/27/2020

## 2020-08-28 ENCOUNTER — Inpatient Hospital Stay (HOSPITAL_COMMUNITY): Payer: Medicare Other

## 2020-08-28 LAB — CBC
HCT: 31.9 % — ABNORMAL LOW (ref 39.0–52.0)
Hemoglobin: 10.8 g/dL — ABNORMAL LOW (ref 13.0–17.0)
MCH: 33.9 pg (ref 26.0–34.0)
MCHC: 33.9 g/dL (ref 30.0–36.0)
MCV: 100 fL (ref 80.0–100.0)
Platelets: 110 10*3/uL — ABNORMAL LOW (ref 150–400)
RBC: 3.19 MIL/uL — ABNORMAL LOW (ref 4.22–5.81)
RDW: 14 % (ref 11.5–15.5)
WBC: 9.9 10*3/uL (ref 4.0–10.5)
nRBC: 0 % (ref 0.0–0.2)

## 2020-08-28 LAB — GLUCOSE, CAPILLARY
Glucose-Capillary: 81 mg/dL (ref 70–99)
Glucose-Capillary: 90 mg/dL (ref 70–99)
Glucose-Capillary: 94 mg/dL (ref 70–99)
Glucose-Capillary: 98 mg/dL (ref 70–99)

## 2020-08-28 LAB — BASIC METABOLIC PANEL
Anion gap: 5 (ref 5–15)
BUN: 9 mg/dL (ref 8–23)
CO2: 27 mmol/L (ref 22–32)
Calcium: 7.8 mg/dL — ABNORMAL LOW (ref 8.9–10.3)
Chloride: 104 mmol/L (ref 98–111)
Creatinine, Ser: 0.68 mg/dL (ref 0.61–1.24)
GFR calc Af Amer: >60 mL/min (ref >60)
GFR calc non Af Amer: >60 mL/min (ref >60)
Glucose, Bld: 89 mg/dL (ref 70–99)
Potassium: 3.8 mmol/L (ref 3.5–5.1)
Sodium: 136 mmol/L (ref 135–145)

## 2020-08-28 MED ORDER — PANTOPRAZOLE SODIUM 40 MG PO TBEC
40.0000 mg | DELAYED_RELEASE_TABLET | Freq: Every day | ORAL | Status: DC
  Administered 2020-08-29 – 2020-08-31 (×3): 40 mg via ORAL
  Filled 2020-08-28 (×3): qty 1

## 2020-08-28 MED ORDER — ~~LOC~~ CARDIAC SURGERY, PATIENT & FAMILY EDUCATION: Freq: Once | Status: DC

## 2020-08-28 MED ORDER — SENNOSIDES-DOCUSATE SODIUM 8.6-50 MG PO TABS: 1.0000 | ORAL_TABLET | Freq: Two times a day (BID) | ORAL | Status: DC | PRN

## 2020-08-28 MED ORDER — FUROSEMIDE 40 MG PO TABS
40.0000 mg | ORAL_TABLET | Freq: Every day | ORAL | Status: AC
  Administered 2020-08-29 – 2020-08-30 (×2): 40 mg via ORAL
  Filled 2020-08-28 (×2): qty 1

## 2020-08-28 MED ORDER — ACETAMINOPHEN 325 MG PO TABS: 650.0000 mg | ORAL_TABLET | Freq: Four times a day (QID) | ORAL | Status: DC | PRN

## 2020-08-28 MED ORDER — SODIUM CHLORIDE 0.9% FLUSH
3.0000 mL | Freq: Two times a day (BID) | INTRAVENOUS | Status: DC
  Administered 2020-08-28 – 2020-08-31 (×6): 3 mL via INTRAVENOUS

## 2020-08-28 MED ORDER — SODIUM CHLORIDE 0.9 % IV SOLN: 250.0000 mL | INTRAVENOUS | Status: DC | PRN

## 2020-08-28 MED ORDER — OXYCODONE HCL 5 MG PO TABS
5.0000 mg | ORAL_TABLET | ORAL | Status: DC | PRN
  Filled 2020-08-28: qty 2

## 2020-08-28 MED ORDER — ONDANSETRON HCL 4 MG PO TABS: 4.0000 mg | ORAL_TABLET | Freq: Four times a day (QID) | ORAL | Status: DC | PRN

## 2020-08-28 MED ORDER — SODIUM CHLORIDE 0.9% FLUSH: 3.0000 mL | INTRAVENOUS | Status: DC | PRN

## 2020-08-28 MED ORDER — FLUTICASONE PROPIONATE 50 MCG/ACT NA SUSP
2.0000 | Freq: Every day | NASAL | Status: DC
  Administered 2020-08-29 – 2020-08-31 (×3): 2 via NASAL
  Filled 2020-08-28: qty 16

## 2020-08-28 MED ORDER — TRAMADOL HCL 50 MG PO TABS
50.0000 mg | ORAL_TABLET | ORAL | Status: DC | PRN
  Administered 2020-08-28 – 2020-08-31 (×6): 50 mg via ORAL
  Filled 2020-08-28 (×6): qty 1

## 2020-08-28 MED ORDER — ONDANSETRON HCL 4 MG/2ML IJ SOLN: 4.0000 mg | Freq: Four times a day (QID) | INTRAMUSCULAR | Status: DC | PRN

## 2020-08-28 MED ORDER — HYDROCORTISONE 1 % EX CREA
TOPICAL_CREAM | Freq: Three times a day (TID) | CUTANEOUS | Status: DC | PRN
  Filled 2020-08-28 (×2): qty 28

## 2020-08-28 MED ORDER — ASPIRIN EC 325 MG PO TBEC
325.0000 mg | DELAYED_RELEASE_TABLET | Freq: Every day | ORAL | Status: DC
  Administered 2020-08-29: 325 mg via ORAL
  Filled 2020-08-28 (×2): qty 1

## 2020-08-28 MED ORDER — ALLOPURINOL 300 MG PO TABS
150.0000 mg | ORAL_TABLET | Freq: Every day | ORAL | Status: DC
  Administered 2020-08-29 – 2020-08-31 (×3): 150 mg via ORAL
  Filled 2020-08-28 (×3): qty 1

## 2020-08-28 MED ORDER — POTASSIUM CHLORIDE CRYS ER 20 MEQ PO TBCR
20.0000 meq | EXTENDED_RELEASE_TABLET | Freq: Two times a day (BID) | ORAL | Status: DC
  Administered 2020-08-28 – 2020-08-29 (×3): 20 meq via ORAL
  Filled 2020-08-28 (×4): qty 1

## 2020-08-28 NOTE — Progress Notes (Signed)
2 Days Post-Op Procedure(s) (LRB): CORONARY ARTERY BYPASS GRAFTING TIMES FOUR USING LEFT INTERNAL MAMMARY ARTERY AND ENDOSCOPICALLY HARVESTED RIGHT GREATER SAPHENOUS VEIN (N/A) MAZE (N/A) TRANSESOPHAGEAL ECHOCARDIOGRAM (TEE) (N/A) CLIPPING OF ATRIAL APPENDAGE (N/A) Subjective: Foley is causing some burning. Otherwise feels well. Doing 1750 on IS.  Objective: Vital signs in last 24 hours: Temp:  [98.1 F (36.7 C)-98.8 F (37.1 C)] 98.6 F (37 C) (09/17 0700) Pulse Rate:  [79-80] 79 (09/17 0614) Cardiac Rhythm: Atrial paced (09/17 0400) Resp:  [11-20] 19 (09/17 0614) BP: (86-116)/(58-90) 116/77 (09/17 0614) SpO2:  [94 %-99 %] 97 % (09/17 4709) Arterial Line BP: (102-126)/(48-57) 126/56 (09/16 1200) Weight:  [85 kg] 85 kg (09/17 0500)  Hemodynamic parameters for last 24 hours: PAP: (23-32)/(8-14) 32/14  Intake/Output from previous day: 09/16 0701 - 09/17 0700 In: 730 [I.V.:488.4; IV Piggyback:241.6] Out: 1945 [Urine:1855; Chest Tube:90] Intake/Output this shift: No intake/output data recorded.  General appearance: alert and cooperative Neurologic: intact Heart: regular rate and rhythm, S1, S2 normal, no murmur Lungs: clear to auscultation bilaterally Extremities: extremities normal, atraumatic, no cyanosis or edema Wound: dressing dry  Lab Results: Recent Labs    08/27/20 1631 08/28/20 0327  WBC 11.5* 9.9  HGB 11.4* 10.8*  HCT 33.6* 31.9*  PLT 121* 110*   BMET:  Recent Labs    08/27/20 1631 08/28/20 0327  NA 137 136  K 3.5 3.8  CL 105 104  CO2 26 27  GLUCOSE 125* 89  BUN 9 9  CREATININE 0.67 0.68  CALCIUM 7.7* 7.8*    PT/INR:  Recent Labs    08/26/20 1636  LABPROT 17.6*  INR 1.5*   ABG    Component Value Date/Time   PHART 7.384 08/26/2020 2305   HCO3 24.8 08/26/2020 2305   TCO2 26 08/26/2020 2305   ACIDBASEDEF 1.0 08/26/2020 2105   O2SAT 99.0 08/26/2020 2305   CBG (last 3)  Recent Labs    08/28/20 0023 08/28/20 0355 08/28/20 0656   GLUCAP 98 81 90   CXR: clear  Assessment/Plan: S/P Procedure(s) (LRB): CORONARY ARTERY BYPASS GRAFTING TIMES FOUR USING LEFT INTERNAL MAMMARY ARTERY AND ENDOSCOPICALLY HARVESTED RIGHT GREATER SAPHENOUS VEIN (N/A) MAZE (N/A) TRANSESOPHAGEAL ECHOCARDIOGRAM (TEE) (N/A) CLIPPING OF ATRIAL APPENDAGE (N/A)  POD 2  Hemodynamically stable in sinus rhythm at 68-70. Will stop pacing and follow. He was on Atenolol 12.5 daily at home preop and may be able to resume if rate stable after MAZE and BP tolerates. On Cozaar preop but will hold off until BP rises.  PAF, s/p MAZE:  He was on Flecainide pre-cath but it was stopped due to plans for surgery and bradycardia. Plan to resume Apixaban at discharge.  Volume excess: -1200 cc yesterday. Wt still 10-11 lbs over preop. Continue diuresis.  DC foley and sleeve  Hx of PTSD: resume Seroquel and Xanax at hs.  IS, ambulation.  Transfer to 4E.   LOS: 2 days    Gaye Pollack 08/28/2020

## 2020-08-28 NOTE — Progress Notes (Signed)
Mobility Specialist - Progress Note   08/28/20 1613  Mobility  Activity Ambulated in hall  Level of Assistance Modified independent, requires aide device or extra time  Assistive Device Front wheel walker  Distance Ambulated (ft) 500 ft  Mobility Response Tolerated well  Mobility performed by Mobility specialist  $Mobility charge 1 Mobility    Pre-mobility: 77 HR, 98% SpO2 During mobility: 96 HR Post-mobility: 71 HR, 99% SpO2  Pt asx throughout ambulation.   Pricilla Handler Mobility Specialist Mobility Specialist Phone: [phone]

## 2020-08-28 NOTE — Discharge Instructions (Signed)

## 2020-08-28 NOTE — Progress Notes (Signed)
CARDIAC REHAB PHASE I   PRE:  Rate/Rhythm: 66 SR  BP:  Supine:   Sitting: 113/69  Standing:    SaO2: 97  MODE:  Ambulation: 740 ft   POST:  Rate/Rhythem: 69 SR  BP:  Supine:   Sitting: 115/67  Standing:    SaO2: 98  Pt lying in bed awake upon arrival. Pt independently got out of bed standing with EVA assistive device. Pt ambulated with EVA for 740 ft. Pt had no complaints or concerns while ambulating. Returned to room to sit in chair. Encouraged to continue with ambulation and IS use.  [id number]  08/28/2020 1:40 PM   Rick Duff, MS CEP

## 2020-08-29 MED ORDER — LACTULOSE 10 GM/15ML PO SOLN
20.0000 g | Freq: Once | ORAL | Status: AC
  Administered 2020-08-29: 20 g via ORAL
  Filled 2020-08-29: qty 30

## 2020-08-29 MED ORDER — ATENOLOL 25 MG PO TABS
12.5000 mg | ORAL_TABLET | Freq: Every day | ORAL | Status: DC
  Administered 2020-08-29 – 2020-08-31 (×3): 12.5 mg via ORAL
  Filled 2020-08-29 (×3): qty 1

## 2020-08-29 MED ORDER — DIPHENHYDRAMINE HCL 25 MG PO CAPS
25.0000 mg | ORAL_CAPSULE | Freq: Four times a day (QID) | ORAL | Status: DC | PRN
  Administered 2020-08-29 – 2020-08-30 (×4): 25 mg via ORAL
  Filled 2020-08-29 (×4): qty 1

## 2020-08-29 NOTE — Progress Notes (Signed)
Temporary pacing wires removed at this time.  Patient tolerated well with no ectopy noted.  On bedrest x 1 hour with q 15 minute vitals.  Explanation of procedure to patient and wife done.

## 2020-08-29 NOTE — Progress Notes (Addendum)
      [address]       [place] [address]             [phone]        3 Days Post-Op Procedure(s) (LRB): CORONARY ARTERY BYPASS GRAFTING TIMES FOUR USING LEFT INTERNAL MAMMARY ARTERY AND ENDOSCOPICALLY HARVESTED RIGHT GREATER SAPHENOUS VEIN (N/A) MAZE (N/A) TRANSESOPHAGEAL ECHOCARDIOGRAM (TEE) (N/A) CLIPPING OF ATRIAL APPENDAGE (N/A)  Subjective: Patient with itching, rash over chest and sides;"I think it is from the tape". Patient has not had a bowel movement yet.  Objective: Vital signs in last 24 hours: Temp:  [97.8 F (36.6 C)-98.4 F (36.9 C)] 98.2 F (36.8 C) (09/18 0552) Pulse Rate:  [64-84] 84 (09/18 0552) Cardiac Rhythm: Normal sinus rhythm (09/17 1935) Resp:  [15-21] 17 (09/18 0552) BP: (99-117)/(58-75) 117/75 (09/18 0552) SpO2:  [92 %-99 %] 96 % (09/18 0552) Weight:  [78 kg-84.9 kg] 84.9 kg (09/18 0552)  Pre op weight 79.9 kg Current Weight  08/29/20 84.9 kg       Intake/Output from previous day: 09/17 0701 - 09/18 0700 In: 250.9 [P.O.:120; I.V.:40.1; IV Piggyback:90.8] Out: -    Physical Exam:  Cardiovascular: RRR Pulmonary: Slightly diminished bibasilar breath sousnds Abdomen: Soft, non tender, bowel sounds present. Extremities: Mild bilateral lower extremity edema. Wounds: Sternal dressing and RLE dressings were removed and wounds are clean and dry.  No erythema or signs of infection. Rash/hives over chest and sides  Lab Results: CBC: Recent Labs    08/27/20 1631 08/28/20 0327  WBC 11.5* 9.9  HGB 11.4* 10.8*  HCT 33.6* 31.9*  PLT 121* 110*   BMET:  Recent Labs    08/27/20 1631 08/28/20 0327  NA 137 136  K 3.5 3.8  CL 105 104  CO2 26 27  GLUCOSE 125* 89  BUN 9 9  CREATININE 0.67 0.68  CALCIUM 7.7* 7.8*    PT/INR:  Lab Results  Component Value Date   INR 1.5 (H) 08/26/2020   INR 1.0 08/20/2020   INR 1.1 ratio (H) 08/09/2010   ABG:  INR: Will add last result for INR, ABG once components are  confirmed Will add last 4 CBG results once components are confirmed  Assessment/Plan:  1. CV - History of persistent a fib. S/p MAZE and LA clip. Will resume Apixaban at discharge and decrease ec asa to 81 mg daily. Previously paced. HR in the 80's and BP improving. Will restart low dose Atenolol. 2.  Pulmonary - On room air. Encourage incentive spirometer. 3. Volume Overload - On Lasix 40 mg daily 4.  Expected post op acute blood loss anemia -  H and H yesterday 10.8 and 31.9 5. Thrombocytopenia-platelet count yesterday 110,000 6. History of PTSD-continue Seroquel and Xanax 7. Hydrocortisone cream and Benadryl PRN rash over chest and sides-possibly from tape 8. Remove EPW 9. LOC constipation 10. Probable discharge on Monday  Sharalyn Ink [facility] 08/29/2020,8:22 AM   I have seen and examined the patient and agree with the assessment and plan as outlined.  Maintaining NSR and doing very well.  Rexene Alberts, MD 08/29/2020 12:29 PM

## 2020-08-29 NOTE — Progress Notes (Signed)
CARDIAC REHAB PHASE I   PRE:  Rate/Rhythm: 76 SR  BP:  Supine:   Sitting: 120/72  Standing:    SaO2: 95 Ra  MODE:  Ambulation: 790 ft   POST:  Rate/Rhythm: 77 SR  BP:  Supine:   Sitting: 108/85  Standing:    SaO2: 98 RA 1035-1115 Assisted X 1 and used walker to ambulate. Gait steady with walker. He was able to walk 790 feet without c/o. VS stable Pt to chair after walk with wife present.  Rodney Langton RN 08/29/2020 11:12 AM

## 2020-08-30 LAB — BASIC METABOLIC PANEL
Anion gap: 8 (ref 5–15)
BUN: 9 mg/dL (ref 8–23)
CO2: 25 mmol/L (ref 22–32)
Calcium: 8.3 mg/dL — ABNORMAL LOW (ref 8.9–10.3)
Chloride: 100 mmol/L (ref 98–111)
Creatinine, Ser: 0.57 mg/dL — ABNORMAL LOW (ref 0.61–1.24)
GFR calc Af Amer: >60 mL/min (ref >60)
GFR calc non Af Amer: >60 mL/min (ref >60)
Glucose, Bld: 91 mg/dL (ref 70–99)
Potassium: 3.7 mmol/L (ref 3.5–5.1)
Sodium: 133 mmol/L — ABNORMAL LOW (ref 135–145)

## 2020-08-30 LAB — CBC
HCT: 30.6 % — ABNORMAL LOW (ref 39.0–52.0)
Hemoglobin: 10.5 g/dL — ABNORMAL LOW (ref 13.0–17.0)
MCH: 33.9 pg (ref 26.0–34.0)
MCHC: 34.3 g/dL (ref 30.0–36.0)
MCV: 98.7 fL (ref 80.0–100.0)
Platelets: 148 10*3/uL — ABNORMAL LOW (ref 150–400)
RBC: 3.1 MIL/uL — ABNORMAL LOW (ref 4.22–5.81)
RDW: 13.6 % (ref 11.5–15.5)
WBC: 6.8 10*3/uL (ref 4.0–10.5)
nRBC: 0 % (ref 0.0–0.2)

## 2020-08-30 MED ORDER — ASPIRIN EC 81 MG PO TBEC
81.0000 mg | DELAYED_RELEASE_TABLET | Freq: Every day | ORAL | Status: DC
  Administered 2020-08-30 – 2020-08-31 (×2): 81 mg via ORAL
  Filled 2020-08-30: qty 1

## 2020-08-30 MED ORDER — POTASSIUM CHLORIDE CRYS ER 20 MEQ PO TBCR
30.0000 meq | EXTENDED_RELEASE_TABLET | Freq: Two times a day (BID) | ORAL | Status: AC
  Administered 2020-08-30 (×2): 30 meq via ORAL
  Filled 2020-08-30 (×2): qty 1

## 2020-08-30 MED ORDER — ASPIRIN 81 MG PO TBEC
81.0000 mg | DELAYED_RELEASE_TABLET | Freq: Every day | ORAL | 11 refills | Status: DC
Start: 2020-08-31 — End: 2021-08-10

## 2020-08-30 MED ORDER — POTASSIUM CHLORIDE CRYS ER 20 MEQ PO TBCR
20.0000 meq | EXTENDED_RELEASE_TABLET | Freq: Every day | ORAL | Status: DC
  Administered 2020-08-31: 20 meq via ORAL
  Filled 2020-08-30: qty 1

## 2020-08-30 NOTE — Progress Notes (Addendum)
      [address]       [place] [address]             [phone]        4 Days Post-Op Procedure(s) (LRB): CORONARY ARTERY BYPASS GRAFTING TIMES FOUR USING LEFT INTERNAL MAMMARY ARTERY AND ENDOSCOPICALLY HARVESTED RIGHT GREATER SAPHENOUS VEIN (N/A) MAZE (N/A) TRANSESOPHAGEAL ECHOCARDIOGRAM (TEE) (N/A) CLIPPING OF ATRIAL APPENDAGE (N/A)  Subjective: Patient with itching, rash over chest and sides;"I think it is from the adhesive". Patient had large bowel movement.  Objective: Vital signs in last 24 hours: Temp:  [98 F (36.7 C)-98.4 F (36.9 C)] 98.4 F (36.9 C) (09/19 0812) Pulse Rate:  [72-82] 82 (09/19 0812) Cardiac Rhythm: Normal sinus rhythm (09/19 0704) Resp:  [13-20] 20 (09/19 0812) BP: (107-130)/(65-82) 121/82 (09/19 0812) SpO2:  [96 %-99 %] 98 % (09/19 0812) Weight:  [83.8 kg] 83.8 kg (09/19 0543)  Pre op weight 79.9 kg Current Weight  08/30/20 83.8 kg       Intake/Output from previous day: 09/18 0701 - 09/19 0700 In: 840 [P.O.:840] Out: -    Physical Exam:  Cardiovascular: RRR Pulmonary: Slightly diminished bibasilar breath sousnds Abdomen: Soft, non tender, bowel sounds present. Extremities: Mild bilateral lower extremity edema. Wounds: Sternal dressing and RLE dressings were removed and wounds are clean and dry.  No erythema or signs of infection. Rash/hives over chest and sides  Lab Results: CBC: Recent Labs    08/28/20 0327 08/30/20 0647  WBC 9.9 6.8  HGB 10.8* 10.5*  HCT 31.9* 30.6*  PLT 110* 148*   BMET:  Recent Labs    08/28/20 0327 08/30/20 0647  NA 136 133*  K 3.8 3.7  CL 104 100  CO2 27 25  GLUCOSE 89 91  BUN 9 9  CREATININE 0.68 0.57*  CALCIUM 7.8* 8.3*    PT/INR:  Lab Results  Component Value Date   INR 1.5 (H) 08/26/2020   INR 1.0 08/20/2020   INR 1.1 ratio (H) 08/09/2010   ABG:  INR: Will add last result for INR, ABG once components are confirmed Will add last 4 CBG results once  components are confirmed  Assessment/Plan:  1. CV - History of persistent a fib. S/p MAZE and LA clip. On low dose Atenolol as taken prior to surgery. Will resume Apixaban at discharge and decrease ec asa to 81 mg daily.  2.  Pulmonary - On room air. Encourage incentive spirometer. 3. Volume Overload - On Lasix 40 mg daily 4.  Expected post op acute blood loss anemia -  H and H this am stable at 10.5 and 30.6 5. Thrombocytopenia-platelet count this am up to 148,000 6. History of PTSD-continue Seroquel and Xanax 7. Hydrocortisone cream and Benadryl PRN rash over chest and sides-possibly from tape 8. Supplement potassium 9. Probable discharge in am  Sharalyn Ink [facility] 08/30/2020,8:46 AM   I have seen and examined the patient and agree with the assessment and plan as outlined.  Maintaining NSR.  Plan d/c home tomorrow.  Rexene Alberts, MD 08/30/2020 10:11 AM

## 2020-08-30 NOTE — Progress Notes (Signed)
Mobility Specialist: Progress Note   08/30/20 1209  Mobility  Activity Ambulated in hall  Level of Assistance Independent  Assistive Device None  Distance Ambulated (ft) 830 ft  Mobility Response Tolerated well  Mobility performed by Mobility specialist  Bed Position Chair  $Mobility charge 1 Mobility   Pre-Mobility: 74 HR During Mobility: 77 HR, 96% SpO2 Post-Mobility: 79 HR, 122/75 BP, 96% SpO2  Pt had no c/o during ambulation.   [facility] Tanvi Gatling Mobility Specialist

## 2020-08-31 ENCOUNTER — Ambulatory Visit: Payer: Medicare Other | Admitting: Gastroenterology

## 2020-08-31 ENCOUNTER — Encounter: Payer: Self-pay | Admitting: *Deleted

## 2020-08-31 DIAGNOSIS — Z006 Encounter for examination for normal comparison and control in clinical research program: Secondary | ICD-10-CM

## 2020-08-31 MED ORDER — APIXABAN 5 MG PO TABS: 5.0000 mg | ORAL_TABLET | Freq: Two times a day (BID) | ORAL | Status: DC

## 2020-08-31 MED ORDER — ATORVASTATIN CALCIUM 40 MG PO TABS: 40.0000 mg | ORAL_TABLET | Freq: Every day | ORAL | 11 refills | Status: AC

## 2020-08-31 NOTE — Progress Notes (Signed)
Pt received discharge instructions and does not have any questions or concerns at this time. Pt's vital sign is stable. Pt is ready for discharge.

## 2020-08-31 NOTE — Progress Notes (Signed)
CARDIAC REHAB PHASE I   Brief d/c ed completed as pt was leaving for home. Encouraged continued IS use, walks, and sternal precautions. Pt given in-the-tube sheet along with heart healthy diet. Reviewed site care, restrictions, and exercise guidelines. Will refer to CRP II [place].  [id number] Rufina Falco, RN BSN 08/31/2020 10:12 AM

## 2020-08-31 NOTE — Research (Signed)
Joseph Larsen met criteria for the TRAC-AF Registry. The registry was discussed with Joseph Larsen including risks versus benefits. He had the opportunity to read the consent and ask questions. The informed consent was signed and the a copy was given to Joseph Larsen and a copy was placed in the chart. No registry required procedures were performed prior to the informed consent.

## 2020-08-31 NOTE — Progress Notes (Addendum)
      [address]       [place] [address]             [phone]      5 Days Post-Op Procedure(s) (LRB): CORONARY ARTERY BYPASS GRAFTING TIMES FOUR USING LEFT INTERNAL MAMMARY ARTERY AND ENDOSCOPICALLY HARVESTED RIGHT GREATER SAPHENOUS VEIN (N/A) MAZE (N/A) TRANSESOPHAGEAL ECHOCARDIOGRAM (TEE) (N/A) CLIPPING OF ATRIAL APPENDAGE (N/A) Subjective: Feels good, no complaints or concerns.  Independent with mobility and maintaining adequate sats on RA.   Objective: Vital signs in last 24 hours: Temp:  [97.8 F (36.6 C)-98.8 F (37.1 C)] 98 F (36.7 C) (09/20 0740) Pulse Rate:  [74-85] 85 (09/20 0740) Cardiac Rhythm: Normal sinus rhythm (09/19 1940) Resp:  [12-20] 18 (09/20 0740) BP: (110-131)/(74-85) 128/79 (09/20 0740) SpO2:  [97 %-100 %] 100 % (09/20 0740) Weight:  [81.7 kg] 81.7 kg (09/20 0634)     Intake/Output from previous day: 09/19 0701 - 09/20 0700 In: 480 [P.O.:480] Out: 2 [Urine:2] Intake/Output this shift: No intake/output data recorded.  General appearance: alert, cooperative and no distress Neurologic: intact Heart: Maintaining SR.  Lungs: clear to auscultation bilaterally Abdomen: soft and NT.  Extremities: No peripheral edema. RLE EVH incision is intact and dry.  Wound: the sternotomy incision is healing well. Pacer wires are out.   Lab Results: Recent Labs    08/30/20 0647  WBC 6.8  HGB 10.5*  HCT 30.6*  PLT 148*   BMET:  Recent Labs    08/30/20 0647  NA 133*  K 3.7  CL 100  CO2 25  GLUCOSE 91  BUN 9  CREATININE 0.57*  CALCIUM 8.3*    PT/INR: No results for input(s): LABPROT, INR in the last 72 hours. ABG    Component Value Date/Time   PHART 7.384 08/26/2020 2305   HCO3 24.8 08/26/2020 2305   TCO2 26 08/26/2020 2305   ACIDBASEDEF 1.0 08/26/2020 2105   O2SAT 99.0 08/26/2020 2305   CBG (last 3)  Recent Labs    08/28/20 1151  GLUCAP 94    Assessment/Plan: S/P Procedure(s) (LRB): CORONARY ARTERY BYPASS  GRAFTING TIMES FOUR USING LEFT INTERNAL MAMMARY ARTERY AND ENDOSCOPICALLY HARVESTED RIGHT GREATER SAPHENOUS VEIN (N/A) MAZE (N/A) TRANSESOPHAGEAL ECHOCARDIOGRAM (TEE) (N/A) CLIPPING OF ATRIAL APPENDAGE (N/A)  -POD5 CABG x 4 witrh MAZE and left atrial appendage clip. Making good progress, remains in stable SR.  Feels he is ready to go home.   -Plan discharge to home today on atenolol, ASA 81mg , statin, Eliquis, Tramadol, Seroquel, and Xanax.    LOS: 5 days    Antony Odea, Hershal Coria [phone] 08/31/2020    Chart reviewed, patient examined, agree with above. He looks good. Maintaining sinus rhythm. Plan home today.

## 2020-08-31 NOTE — Progress Notes (Signed)
Discharge instructions given and patient stated understanding.  He is waiting for his wife to get here.

## 2020-08-31 NOTE — Discharge Summary (Signed)
Physician Discharge Summary  Patient ID: Joseph Larsen MRN: [id number] DOB/AGE: [birth year] 73 y.o.  Admit date: 08/26/2020 Discharge date: 08/31/2020  Admission Diagnoses:  Coronary artery disease Persistent atrial fibrillation Dyslipidemia Hypertension History of PTSD History of anxiety and depression History of gastroesophageal reflux disease S/P CABG x 4 and MAZE procedure   Discharge Diagnoses:  Coronary artery disease Persistent atrial fibrillation Dyslipidemia Hypertension History of PTSD History of anxiety and depression History of gastroesophageal reflux disease S/P CABG x 4 and MAZE procedure  Discharged Condition: stable  History of Present Illness: The patient is a 74 year old gentleman with history of hypertension, hyperlipidemia, paroxysmal and now persistent atrial fibrillation who was maintaining normal sinus rhythm on flecainide after his most recent cardioversion. He had an exercise Myoview suggesting possible inferior scar and a coronary CTA showed a calcium score of 3159 with calcified plaque most prominently in the distal left main and proximal LAD as well as the proximal to mid RCA. FFR of the left main was 0.89 suggesting no significant stenosis. FFR in the LAD was 0.76 at the origin of the first diagonal and 0.66 in the midportion. Distally it was 0.58 suggesting significant LAD stenosis. The FFR of left circumflex was 0.86. It was not possible to interpret the RCA data due to artifact. He subsequently underwent cardiac catheterization on 08/07/2020 which showed 50% calcified left main stenosis. The proximal to mid LAD had 90% stenosis. The ramus had 90% stenosis. The ostial to proximal left circumflex had 70% stenosis. RCA had mild nonobstructive disease. Left ventricular systolic function was normal with normal LVEDP.  Coronary bypass grafting and an open MAZE procedure were offered to Joseph Larsen by Dr. Cyndia Bent and he decided to proceed with surgery.    Hospital  Course: Joseph Larsen was admitted for elective surgery on 08/26/20 and prepared for surgery. Coronary artery bypass grafting was performed with the left internal mammary artery grafted to the left anterior descending coronary and saphenous veins grafted to the diagonal, remus intermediate, and obtuse marginal coronary arteries.  A bi-atrial MAZE procedure was also performed along with clipping of the left atrial appendage. Following the procedure he separated from cardiopulmonary bypass without any difficulty on phenylephrine for BP support.  He was transferred to the ICU where he remained hemodynamically stable. He was weaned from mechanical vent support and extubated at approximately 10pm on the day of surgery. The chest tube drainage was minimal overnight so they were removed on the first post-op day along with the PA catheter and arterial line. He was mobilized with physical therapy. His Seroquel and Xanax used to treat his PTSD were resumed on the first post-op day.  Diuresis was initiated on the first post-op day and he responded well. He was transferred to [facility] on the 2nd post-operative day and was in SR at the time. His mobility continued to improve. He was weaned from the supplemental O2 without difficulty. He has been tolerating a diet and had a bowel movement. Epicardial pacing wires were removed on 09/18. Of note, he developed a rash across his chest and sides, likely from adhesive (tape and leads). He was given Hydrocortisone cream and Benadryl PRN, which helped some. His BP and HR improved and he was restarted on low dose Atenolol 12.5 mg daily as taken prior to surgery. He will be discharged on apixaban for his history of paroxysmal atrial fibrillation. .   Consults: None  Significant Diagnostic Studies:   LEFT HEART CATH AND CORONARY ANGIOGRAPHY  Conclusion  Mid LM to Dist LM lesion is 50% stenosed.  Prox LAD to Mid LAD lesion is 90% stenosed.  Ramus lesion is 90%  stenosed.  Ost Cx to Prox Cx lesion is 70% stenosed.  Mid Cx lesion is 50% stenosed.  Prox RCA lesion is 30% stenosed.  The left ventricular systolic function is normal.  LV end diastolic pressure is normal.  The left ventricular ejection fraction is 55-65% by visual estimate.   1. 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  2. Normal LV function 3. Normal LVEDP  Plan: given complexity of disease I would recommend CT surgery referral to consider CABG and MAZE procedure. OK to resume Eliquis tonight.   Recommendations  Antiplatelet/Anticoag Recommend to resume Apixaban, at currently prescribed dose and frequency on 08/07/2020. Concurrent antiplatelet therapy not recommended.  Surgeon Notes    08/26/2020 5:38 PM Op Note signed by Gaye Pollack, MD    08/26/2020 4:26 PM Operative Note - Scan signed by Default, Provider, MD  Indications  Coronary artery disease of native artery of native heart with stable angina pectoris ([place]) [K46.286 (ICD-10-CM)]  Procedural Details  Technical Details Indication: 74 yo WM with AFib and abnormal coronary CTA  Procedural Details: The right wrist was prepped, draped, and anesthetized with 1% lidocaine. Using the modified Seldinger technique, a 6 French slender sheath was introduced into the right radial artery. 3 mg of verapamil was administered through the sheath, weight-based unfractionated heparin was administered intravenously. Standard Judkins catheters were used for selective coronary angiography and left ventriculography. Catheter exchanges were performed over an exchange length guidewire. There were no immediate procedural complications. A TR band was used for radial hemostasis at the completion of the procedure.  The patient was transferred to the post catheterization recovery area for further monitoring. Contrast: 55 cc Estimated blood loss <50 mL.   During this procedure medications were administered to achieve  and maintain moderate conscious sedation while the patient's heart rate, blood pressure, and oxygen saturation were continuously monitored and I was present face-to-face 100% of this time.  Medications (Filter: Administrations occurring from 3817 to 1208 on 08/07/20) (important) Continuous medications are totaled by the amount administered until 08/07/20 1208.  fentaNYL (SUBLIMAZE) injection (mcg) Total dose:  25 mcg  Date/Time  Rate/Dose/Volume Action  08/07/20 1135  25 mcg Given    midazolam (VERSED) injection (mg) Total dose:  1 mg  Date/Time  Rate/Dose/Volume Action  08/07/20 1135  1 mg Given    Heparin (Porcine) in NaCl 1000-0.9 UT/500ML-% SOLN (mL) Total volume:  1,500 mL  Date/Time  Rate/Dose/Volume Action  08/07/20 1136  500 mL Given  1136  500 mL Given  1149  500 mL Given    lidocaine (PF) (XYLOCAINE) 1 % injection (mL) Total volume:  2 mL  Date/Time  Rate/Dose/Volume Action  08/07/20 1141  2 mL Given    Radial Cocktail/Verapamil only (mL) Total volume:  10 mL  Date/Time  Rate/Dose/Volume Action  08/07/20 1141  10 mL Given    heparin sodium (porcine) injection (Units) Total dose:  4,000 Units  Date/Time  Rate/Dose/Volume Action  08/07/20 1143  4,000 Units Given    iohexol (OMNIPAQUE) 350 MG/ML injection (mL) Total volume:  55 mL  Date/Time  Rate/Dose/Volume Action  08/07/20 1156  55 mL Given    Sedation Time  Sedation Time Physician-1: 22 minutes 49 seconds  Contrast  Medication Name Total Dose  iohexol (OMNIPAQUE) 350 MG/ML injection 55 mL    Radiation/Fluoro  Fluoro time: 3 (min) DAP: 14.8 (Gycm2) Cumulative Air Kerma: 281.2 (mGy)  Complications  Complications documented before study signed (08/07/2020 12:10 PM)   No complications were associated with this study.  Documented by [place], Peter M, MD - 08/07/2020 12:09 PM    Coronary Findings  Diagnostic Dominance: Right Left Main  Mid LM to Dist LM lesion is 50% stenosed.  Left  Anterior Descending  Prox LAD to Mid LAD lesion is 90% stenosed. The lesion is segmental with right-to-left collateral flow. The lesion is severely calcified.  Ramus Intermedius  Ramus lesion is 90% stenosed.  Left Circumflex  Ost Cx to Prox Cx lesion is 70% stenosed.  Mid Cx lesion is 50% stenosed.  Right Coronary Artery  Vessel is large. The vessel is severely calcified.  Prox RCA lesion is 30% stenosed.  Intervention  No interventions have been documented. Wall Motion  Resting               Left Heart  Left Ventricle The left ventricular size is normal. The left ventricular systolic function is normal. LV end diastolic pressure is normal. The left ventricular ejection fraction is 55-65% by visual estimate. No regional wall motion abnormalities.  Coronary Diagrams  Diagnostic Dominance: Right    ECHOCARDIOGRAM REPORT       Patient Name:  DEMBA NIGH Date of Exam: 04/23/2020  Medical Rec #: [id number]   Height:    71.0 in  Accession #:  [id number]  Weight:    189.0 lb  Date of Birth: [birth year]   BSA:     2.058 m  Patient Age:  73 years   BP:      138/88 mmHg  Patient Gender: M       HR:      69 bpm.  Exam Location: [address]   Procedure: 2D Echo, 3D Echo, Cardiac Doppler and Color Doppler   Indications:  I48 Atrial fibrillation    History:    Patient has prior history of Echocardiogram examinations,  most         recent 05/15/2018. Arrythmias:Atrial Fibrillation and  Palpitation,         Signs/Symptoms:Shortness of Breath; Risk  Factors:Hypertension         and Former Smoker. ETOH.    Sonographer:  Garald Braver, RDCS  Referring Phys: [id number]   IMPRESSIONS    1. Left ventricular ejection fraction, by estimation, is 55 to 60%. The  left ventricle has normal function. The left ventricle has no regional  wall motion abnormalities. There is mild concentric  left ventricular  hypertrophy. Left ventricular diastolic  parameters are indeterminate.  2. Right ventricular systolic function is normal. The right ventricular  size is normal. There is normal pulmonary artery systolic pressure.  3. Left atrial size was severely dilated.  4. The mitral valve is normal in structure. No evidence of mitral valve  regurgitation. No evidence of mitral stenosis.  5. The aortic valve is normal in structure. Aortic valve regurgitation is  not visualized. No aortic stenosis is present.  6. Aortic dilatation noted. Aneurysm of the ascending aorta, measuring 40  mm. There is mild dilatation of the aortic root measuring 39 mm.  7. The inferior vena cava is dilated in size with <50% respiratory  variability, suggesting right atrial pressure of 15 mmHg.   Comparison(s): 05/15/18 EF 60-65%.   FINDINGS  Left Ventricle: Left ventricular ejection fraction, by estimation, is 55  to 60%. The left ventricle has  normal function. The left ventricle has no  regional wall motion abnormalities. The left ventricular internal cavity  size was normal in size. There is  mild concentric left ventricular hypertrophy. Left ventricular diastolic  parameters are indeterminate.   Right Ventricle: The right ventricular size is normal. No increase in  right ventricular wall thickness. Right ventricular systolic function is  normal. There is normal pulmonary artery systolic pressure. The tricuspid  regurgitant velocity is 2.42 m/s, and  with an assumed right atrial pressure of 3 mmHg, the estimated right  ventricular systolic pressure is 03.5 mmHg.   Left Atrium: Left atrial size was severely dilated.   Right Atrium: Right atrial size was normal in size.   Pericardium: There is no evidence of pericardial effusion.   Mitral Valve: The mitral valve is normal in structure. Normal mobility of  the mitral valve leaflets. No evidence of mitral valve regurgitation. No  evidence  of mitral valve stenosis.   Tricuspid Valve: The tricuspid valve is normal in structure. Tricuspid  valve regurgitation is mild . No evidence of tricuspid stenosis.   Aortic Valve: The aortic valve is normal in structure.. There is mild  thickening and mild calcification of the aortic valve. Aortic valve  regurgitation is not visualized. No aortic stenosis is present. There is  mild thickening of the aortic valve. There  is mild calcification of the aortic valve.   Pulmonic Valve: The pulmonic valve was normal in structure. Pulmonic valve  regurgitation is trivial. No evidence of pulmonic stenosis.   Aorta: Aortic dilatation noted. 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   Venous: The inferior vena cava is dilated in size with less than 50%  respiratory variability, suggesting right atrial pressure of 15 mmHg.   IAS/Shunts: No atrial level shunt detected by color flow Doppler.     LEFT VENTRICLE  PLAX 2D  LVIDd:     4.70 cm  LVIDs:     3.20 cm  LV PW:     1.30 cm  LV IVS:    1.30 cm  LVOT diam:   2.30 cm 3D Volume EF:  LV SV:     56    3D EF:    53 %  LV SV Index:  27    LV EDV:    126 ml  LVOT Area:   4.15 cm LV ESV:    60 ml             LV SV:    66 ml   RIGHT VENTRICLE  RV Basal diam: 3.90 cm  RV S prime:   6.58 cm/s  TAPSE (M-mode): 1.8 cm  RVSP:      26.4 mmHg   LEFT ATRIUM       Index    RIGHT ATRIUM      Index  LA diam:    4.50 cm 2.19 cm/m RA Pressure: 3.00 mmHg  LA Vol (A2C):  89.2 ml 43.33 ml/m RA Area:   20.50 cm  LA Vol (A4C):  98.1 ml 47.66 ml/m RA Volume:  57.50 ml 27.93 ml/m  LA Biplane Vol: 94.9 ml 46.10 ml/m  AORTIC VALVE  LVOT Vmax:  65.60 cm/s  LVOT Vmean: 45.400 cm/s  LVOT VTI:  0.134 m    AORTA  Ao Root diam: 3.90 cm  Ao Asc diam: 4.00  cm   TRICUSPID VALVE  TR Peak  grad:  23.4 mmHg  TR Vmax:    242.00 cm/s  Estimated RAP: 3.00 mmHg  RVSP:      26.4 mmHg    SHUNTS  Systemic VTI: 0.13 m  Systemic Diam: 2.30 cm   Skeet Latch MD  Electronically signed by Skeet Latch MD  Signature Date/Time: 04/23/2020/12:52:08 PM     Treatments:  CARDIOVASCULAR SURGERY OPERATIVE NOTE  08/26/2020  Surgeon:  Gaye Pollack, MD  First Assistant: Enid Cutter, P-AC   Preoperative Diagnosis:  Severe multi-vessel coronary artery disease, paroxysmal atrial fibrillation   Postoperative Diagnosis:  Same   Procedure:  1. Median Sternotomy 2. Extracorporeal circulation 3.   Coronary artery bypass grafting x 4   Left internal mammary artery graft to the LAD  SVG to diagonal  SVG to Ramus  SVG to OM 4.   Endoscopic vein harvest from the right leg 5.   Bi-atrial MAZE IV using RF and cryothermy 6.   Clipping of left atrial appendage  Anesthesia:  General Endotracheal   Clinical History/Surgical Indication:  This 74 year old gentleman has moderate calcified left main and severe two-vessel coronary disease involving the LAD and left circumflex systems. Cardiac CT FFR shows significant ischemia in the LAD distribution. He also has a history of paroxysmal to persistent atrial fibrillation but now is required flecainide to maintain sinus rhythm after cardioversion. I agree that coronary bypass graft surgery is the best treatment event ischemia and infarction. I also think he should have a Maze procedure to maximize the potential to maintain sinus rhythm without antiarrhythmic therapy. I reviewed the cardiac cath images with the patient and his wife and answered their questions. I discussed the operative procedure with the patient and his wife including alternatives, benefits and risks; including but not limited to bleeding, blood transfusion, infection, stroke, myocardial infarction, graft  failure, heart block requiring a permanent pacemaker, organ dysfunction, and death. Joseph E Larsen and agrees to proceed.    Discharge Exam: Blood pressure 128/79, pulse 85, temperature 98 F (36.7 C), temperature source Oral, resp. rate 18, height _0  (1.803 m), weight 81.7 kg, SpO2 100 %.  General appearance: alert, cooperative and no distress Neurologic: intact Heart: Maintaining SR.  Lungs: clear to auscultation bilaterally Abdomen: soft and NT.  Extremities: No peripheral edema. RLE EVH incision is intact and dry.  Wound: the sternotomy incision is healing well. Pacer wires are out.   Disposition:  Discharged to home in stable condition.         Allergies as of 08/31/2020      Reactions   Terazosin Rash   Rash when in sun   Enalapril Maleate Rash   Lisinopril Rash, Itching   Penicillins Itching, Rash   35 years ago   Adhesive [tape] Rash   bandaids       Medication List    STOP taking these medications   hydrochlorothiazide 25 MG tablet Commonly known as: HYDRODIURIL   losartan 100 MG tablet Commonly known as: COZAAR   nitroGLYCERIN 0.4 MG SL tablet Commonly known as: NITROSTAT   potassium chloride SA 20 MEQ tablet Commonly known as: KLOR-CON     TAKE these medications   acetaminophen 500 MG tablet Commonly known as: TYLENOL Take 1,000 mg by mouth daily as needed for moderate pain or headache.   allopurinol 300 MG tablet Commonly known as: ZYLOPRIM Take 150 mg by mouth daily.   ALPRAZolam 0.5 MG tablet Commonly known as: XANAX Take 0.25 mg by mouth at bedtime.  apixaban 5 MG Tabs tablet Commonly known as: ELIQUIS Take 1 tablet (5 mg total) by mouth 2 (two) times daily.   aspirin 81 MG EC tablet Take 1 tablet (81 mg total) by mouth daily. Swallow whole.   atenolol 25 MG tablet Commonly known as: TENORMIN Take 0.5 tablets (12.5 mg total) by mouth daily.   atorvastatin 40 MG tablet Commonly known as: Lipitor Take 1 tablet  (40 mg total) by mouth daily.   Cyanocobalamin 1000 MCG/ML Kit Inject 1,000 mcg as directed every 30 (thirty) days.   Fish Oil 1000 MG Caps Take 2,000 mg by mouth 2 (two) times daily.   fluticasone 50 MCG/ACT nasal spray Commonly known as: FLONASE Place 2 sprays into both nostrils 2 (two) times daily as needed for allergies or rhinitis.   omeprazole 20 MG capsule Commonly known as: PRILOSEC Take 20 mg by mouth 2 (two) times daily.   Osteo Bi-Flex Adv Triple St Tabs Take 1 tablet by mouth daily.   QUEtiapine 100 MG tablet Commonly known as: SEROQUEL Take 25 mg by mouth at bedtime. Pt only takes 1/4 tab   tamsulosin 0.4 MG Caps capsule Commonly known as: FLOMAX Take 0.4 mg by mouth daily after breakfast.   Vitamin D 50 MCG (2000 UT) tablet Take 2,000 Units by mouth daily.       Follow-up Information    [facility] Go on 09/09/2020.   Specialty: Cardiothoracic Surgery Why: Your appointment for suture removal is on Wednesday, 09/09/20 at 11:30am. Contact information: [place], [place] [phone]       Richardson Dopp T, PA-C Follow up.   Specialties: Cardiology, Physician Assistant Why: Your Cardiology follow up appointment is on Friday,  10/12/20 at 11:45am. Contact information: [address] [place] [address] [phone]        Gaye Pollack, MD. Go on 09/23/2020.   Specialty: Cardiothoracic Surgery Why: Your appointment is on Wednesday, 09/23/20 at 11:30am. Please arrive 30 minutes early for a chest x-ray to be performed by [facility] located on the first floor of the same building.  Contact information: [address] [place] [address] [phone]               Signed: Antony Odea 08/31/2020, 8:03 AM

## 2020-09-01 MED FILL — Heparin Sodium (Porcine) Inj 1000 Unit/ML: INTRAMUSCULAR | Qty: 10 | Status: AC

## 2020-09-01 MED FILL — Lidocaine HCl Local Preservative Free (PF) Inj 1%: INTRAMUSCULAR | Qty: 2 | Status: AC

## 2020-09-01 MED FILL — Electrolyte-R (PH 7.4) Solution: INTRAVENOUS | Qty: 6000 | Status: AC

## 2020-09-01 MED FILL — Sodium Bicarbonate IV Soln 8.4%: INTRAVENOUS | Qty: 50 | Status: AC

## 2020-09-01 MED FILL — Mannitol IV Soln 20%: INTRAVENOUS | Qty: 500 | Status: AC

## 2020-09-01 MED FILL — Sodium Chloride IV Soln 0.9%: INTRAVENOUS | Qty: 4000 | Status: AC

## 2020-09-09 ENCOUNTER — Encounter (INDEPENDENT_AMBULATORY_CARE_PROVIDER_SITE_OTHER): Payer: Self-pay

## 2020-09-09 ENCOUNTER — Other Ambulatory Visit: Payer: Self-pay

## 2020-09-09 DIAGNOSIS — Z4802 Encounter for removal of sutures: Secondary | ICD-10-CM

## 2020-09-10 ENCOUNTER — Encounter: Payer: Self-pay | Admitting: Physician Assistant

## 2020-09-10 DIAGNOSIS — I480 Paroxysmal atrial fibrillation: Secondary | ICD-10-CM | POA: Insufficient documentation

## 2020-09-10 DIAGNOSIS — I6523 Occlusion and stenosis of bilateral carotid arteries: Secondary | ICD-10-CM | POA: Insufficient documentation

## 2020-09-10 DIAGNOSIS — E785 Hyperlipidemia, unspecified: Secondary | ICD-10-CM

## 2020-09-10 HISTORY — DX: Hyperlipidemia, unspecified: E78.5

## 2020-09-10 NOTE — Progress Notes (Signed)
Cardiology Office Note:    Date:  09/11/2020   ID:  Joseph Larsen, DOB November 09, 1946, MRN [id number]  PCP:  [name], DO  [facility] Cardiologist:  Jenkins Rouge, MD   [facility] Electrophysiologist:  None   Referring MD: [name], DO   Chief Complaint:  Hospitalization Follow-up (S/p CABG)    Patient Profile:    Joseph Larsen is a 74 y.o. male with:   Coronary artery disease   S/p CABG 9/21 (Dr. Cyndia Bent; L-LAD, S-Dx, S-RI, S-OM)  Paroxysmal>>persistent atrial fibrillation ? S/p DCCV 05/15/20 >> ERAF ? Flecainide started 6/21>>DCd 2/2 coronary artery disease  ? ETT-Myoview - no ischemia; no VT ? DCCV 06/01/20 >> NSR ? S/p bi-atrial MAZE and LAA clipping 9/21  Hypertension  Hyperlipidemia  Alcohol abuse  Echo 6/19: EF 60-65  Echo 5/21: EF 55-60, LAE, LVH  Carotid artery Dz   [place] 9/21: bilat ICA 1-39    Prior CV studies: Carotid US 08/20/20 B/L ICA 1-39  Cardiac catheterization 08/07/20 LM mid 50 LAD prox 90 RI 90 LCx ost 70, mid 50 RCA prox 30 EF 55-65  Coronary CTA 07/21/20 Ca score 3159 (95th percentile) 1. Left Main: No significant stenosis. FFR = 0.89 2. LAD: Significant stenosis. FFR 0.76 at takeoff of first diagonal, Mid FFR = 0.66, Distal FFR = 0.58 3. LCX: No significant stenosis. Proximal FFR = 0.86 4. RCA: Unable to be interpreted due to artifact. IMPRESSION: 1. CT FFR analysis showed significant stenosis in the LAD as noted, beginning near the takeoff of the first significant diagonal and extending distally.  ETT-Myoview 05/29/2020 Small inferior apical wall infarct, no ischemia, not gated due to atrial fibrillation, low risk ETT portion normal  Event monitor 05/04/2020 Atrial fibrillation, average heart rate 81, 3.1-second pause, <1% PVCs  Echocardiogram 04/23/2020 EF 55-60, no RWMA, mild LVH, normal RV SF, severe LAE, mildly dilated aortic root (39 mm), dilated ascending aorta (40 mm)  History of Present  Illness:    Joseph Larsen was last seen in 07/2020.  His coronary CTA demonstrated hemodynamically significant stenosis in the LAD.  His Flecainide was DC'd and a cardiac catheterization was arranged.  This demonstrated severe 2 v CAD and he was referred for CABG.  He was admitted 9/15-9/20 and underwent CABG x 4, MAZE procedured and LAA clipping.  His post op course was fairly uneventful and he remained in normal sinus rhythm.  He returns for follow up.  He is here today with his wife.  Since discharge, he has been doing well.  He does not really have significant chest soreness.  He has not had shortness of breath.  He has not had syncope or near syncope.  His appetite is good.  He has been sleeping well.  He has had normal bowel movements.  He has not had any fevers.  He is walking 1 to 2 miles a day.      Past Medical History:  Diagnosis Date  . Abnormal ECG    a. 08/2012 Abnl ETT with ST changes in recovery;  b. 08/2012 Ex MV, ex time 10:00, EF 66%, no ischemia/infarct.  . Allergic rhinitis   . Anxiety   . Arthritis   . Carotid artery disease ([place])    [place] 9/21: bilat ICA 1-39   . Cataract   . Coronary artery disease    S/p CABG 9/21 (Dr. Cyndia Bent; L-LAD, S-Dx, S-RI, S-OM)  . Depression   . Diverticular disease   . Dyspnea on  exertion   . ETOH abuse   . GERD (gastroesophageal reflux disease)   . Gout   . Hemorrhoids   . Hiatal hernia   . HTN (hypertension)   . Hyperlipidemia 09/10/2020  . Low testosterone   . MACROCYTIC ANEMIA   . NONSPEC ELEVATION OF LEVELS OF TRANSAMINASE/LDH   . Nonspecific abnormal finding in stool contents   . PAF (paroxysmal atrial fibrillation) ([place])    failed DCCV in 6/21 // Flecainide Rx >>DC'd 2/2 CAD // s/p Maze + LAA clipping in 9/21 // Apixaban  . Palpitations   . PTSD (post-traumatic stress disorder)    [name]    Current Medications: Current Meds  Medication Sig  . acetaminophen (TYLENOL) 500 MG tablet Take 1,000 mg by mouth daily as needed  for moderate pain or headache.  . ALPRAZolam (XANAX) 0.5 MG tablet Take 0.25 mg by mouth at bedtime.   Marland Kitchen apixaban (ELIQUIS) 5 MG TABS tablet Take 1 tablet (5 mg total) by mouth 2 (two) times daily.  Marland Kitchen aspirin EC 81 MG EC tablet Take 1 tablet (81 mg total) by mouth daily. Swallow whole.  Marland Kitchen atenolol (TENORMIN) 25 MG tablet Take 0.5 tablets (12.5 mg total) by mouth daily.  Marland Kitchen atorvastatin (LIPITOR) 40 MG tablet Take 1 tablet (40 mg total) by mouth daily.  . Cholecalciferol (VITAMIN D) 50 MCG (2000 UT) tablet Take 2,000 Units by mouth daily.  . Cyanocobalamin 1000 MCG/ML KIT Inject 1,000 mcg as directed every 30 (thirty) days.  . fluticasone (FLONASE) 50 MCG/ACT nasal spray Place 2 sprays into both nostrils 2 (two) times daily as needed for allergies or rhinitis.  . Misc Natural Products (OSTEO BI-FLEX ADV TRIPLE ST) TABS Take 1 tablet by mouth daily.  . Omega-3 Fatty Acids (FISH OIL) 1000 MG CAPS Take 2,000 mg by mouth 2 (two) times daily.   Marland Kitchen omeprazole (PRILOSEC) 20 MG capsule Take 20 mg by mouth 2 (two) times daily as needed (for heartburn / acid reflux).   . QUEtiapine (SEROQUEL) 100 MG tablet Take 25 mg by mouth at bedtime. Pt only takes 1/4 tab   . tamsulosin (FLOMAX) 0.4 MG CAPS capsule Take 0.4 mg by mouth daily after breakfast.     Allergies:   Terazosin, Enalapril maleate, Lisinopril, Penicillins, and Adhesive [tape]   Social History   Tobacco Use  . Smoking status: Former Smoker    Packs/day: 3.00    Types: Cigarettes    Start date: 02/09/1965    Quit date: 05/12/1982    Years since quitting: 38.3  . Smokeless tobacco: Never Used  Vaping Use  . Vaping Use: Never used  Substance Use Topics  . Alcohol use: Not Currently    Alcohol/week: 14.0 standard drinks    Types: 14 Cans of beer per week    Comment: drinks 2-3 cans of beer/day    . Drug use: No    Comment: 1 cup of decaf coffee/day.     Family Hx: The patient's family history includes Heart disease in his father and  mother; Heart murmur in his sister; Hypertension in his brother, sister, sister, sister, and sister; Liver disease in his brother. There is no history of Colon cancer.  ROS See HPI  EKGs/Labs/Other Test Reviewed:    EKG:  EKG is  ordered today.  The ekg ordered today demonstrates normal sinus rhythm, heart rate 80, first-degree AV block, low voltage, nonspecific ST-T wave changes, QTC 412, similar to prior tracings  Recent Labs: 04/13/2020: TSH  2.560 08/20/2020: ALT 22 08/27/2020: Magnesium 2.0 08/30/2020: BUN 9; Creatinine, Ser 0.57; Hemoglobin 10.5; Platelets 148; Potassium 3.7; Sodium 133   Recent Lipid Panel Lab Results  Component Value Date/Time   CHOL 98 (L) 04/13/2020 08:39 AM   TRIG 68 04/13/2020 08:39 AM   TRIG 82 03/25/2015 09:50 AM   HDL 28 (L) 04/13/2020 08:39 AM   HDL 38 (L) 03/25/2015 09:50 AM   CHOLHDL 3.5 04/13/2020 08:39 AM   LDLCALC 55 04/13/2020 08:39 AM   LDLCALC 54 07/17/2014 08:28 AM   LDLDIRECT 74 01/03/2017 11:00 AM    Physical Exam:    VS:  BP 116/68   Pulse 80   Ht 5' 11"  (1.803 m)   Wt 176 lb (79.8 kg)   SpO2 97%   BMI 24.55 kg/m     Wt Readings from Last 3 Encounters:  09/11/20 176 lb (79.8 kg)  08/31/20 180 lb 3.2 oz (81.7 kg)  08/20/20 176 lb 3 oz (79.9 kg)     Constitutional:      Appearance: Healthy appearance. Not in distress.  Neck:     Vascular: JVD normal.  Pulmonary:     Effort: Pulmonary effort is normal.     Breath sounds: No wheezing. No rales.  Cardiovascular:     Normal rate. Regular rhythm. Normal S1. Normal S2.     Murmurs: There is no murmur.     No rub.     Comments: Median sternotomy well-healed without erythema or discharge Edema:    Peripheral edema absent.  Abdominal:     Palpations: Abdomen is soft. There is no hepatomegaly.  Skin:    General: Skin is warm and dry.  Neurological:     General: No focal deficit present.     Mental Status: Alert and oriented to person, place and time.     Cranial Nerves:  Cranial nerves are intact.       ASSESSMENT & PLAN:    1. Coronary artery disease involving native coronary artery of native heart without angina pectoris Status post CABG, biatrial maze and left atrial appendage clipping.  He seems to be progressing well since his surgery.  I have encouraged him to pursue cardiac rehab.  He is on low-dose aspirin as he is on Apixaban.  Continue beta-blocker, atorvastatin.  Follow-up with Dr. Johnsie Cancel in 3 months.  2. PAF (paroxysmal atrial fibrillation) ([place]) Status post Maze procedure and left atrial branch clipping.  Maintaining sinus rhythm.  Continue Apixaban.  3. Essential hypertension BP controlled.  Losartan and HCTZ were stopped in the hospital due to low blood pressure.  Continue current dose of atenolol.  4. Hyperlipidemia, unspecified hyperlipidemia type Continue high intensity statin therapy.  Arrange fasting CMET, lipids in 3 months.  5. Bilateral carotid artery stenosis Mild bilateral plaque on pre-CABG Dopplers.  Consider repeat study in 2 years.     Dispo:  Return in about 3 months (around 12/12/2020) for Routine Follow Up with Dr. Johnsie Cancel, in person.   Medication Adjustments/Labs and Tests Ordered: Current medicines are reviewed at length with the patient today.  Concerns regarding medicines are outlined above.  Tests Ordered: Orders Placed This Encounter  Procedures  . EKG 12-Lead   Medication Changes: No orders of the defined types were placed in this encounter.   Signed, Richardson Dopp, PA-C  09/11/2020 12:21 PM    [facility] [address], [place], [place]  [address] Phone: [phone]; Fax: [phone]

## 2020-09-11 ENCOUNTER — Ambulatory Visit (INDEPENDENT_AMBULATORY_CARE_PROVIDER_SITE_OTHER): Payer: Medicare Other | Admitting: Physician Assistant

## 2020-09-11 ENCOUNTER — Encounter: Payer: Self-pay | Admitting: Physician Assistant

## 2020-09-11 ENCOUNTER — Other Ambulatory Visit: Payer: Self-pay

## 2020-09-11 VITALS — BP 116/68 | HR 80 | Ht 71.0 in | Wt 176.0 lb

## 2020-09-11 DIAGNOSIS — I10 Essential (primary) hypertension: Secondary | ICD-10-CM

## 2020-09-11 DIAGNOSIS — E785 Hyperlipidemia, unspecified: Secondary | ICD-10-CM

## 2020-09-11 DIAGNOSIS — I480 Paroxysmal atrial fibrillation: Secondary | ICD-10-CM | POA: Diagnosis not present

## 2020-09-11 DIAGNOSIS — I779 Disorder of arteries and arterioles, unspecified: Secondary | ICD-10-CM | POA: Insufficient documentation

## 2020-09-11 DIAGNOSIS — I6523 Occlusion and stenosis of bilateral carotid arteries: Secondary | ICD-10-CM

## 2020-09-11 DIAGNOSIS — I2510 Atherosclerotic heart disease of native coronary artery without angina pectoris: Secondary | ICD-10-CM | POA: Diagnosis not present

## 2020-09-11 NOTE — Patient Instructions (Addendum)
Medication Instructions:  Your physician recommends that you continue on your current medications as directed. Please refer to the Current Medication list given to you today.  *If you need a refill on your cardiac medications before your next appointment, please call your pharmacy*  Lab Work: None ordered today  If you have labs (blood work) drawn today and your tests are completely normal, you will receive your results only by: Marland Kitchen MyChart Message (if you have MyChart) OR . A paper copy in the mail If you have any lab test that is abnormal or we need to change your treatment, we will call you to review the results.  Testing/Procedures: None ordered today  Follow-Up: On 12/22/2020 at 9:15AM with Jenkins Rouge, MD  Other Instructions Come fasting to the appointment with Dr. Johnsie Cancel, we will check your cholesterol

## 2020-09-21 ENCOUNTER — Other Ambulatory Visit: Payer: Self-pay | Admitting: Surgery

## 2020-09-21 DIAGNOSIS — Z951 Presence of aortocoronary bypass graft: Secondary | ICD-10-CM

## 2020-09-23 ENCOUNTER — Ambulatory Visit: Payer: Medicare Other | Admitting: Surgery

## 2020-09-24 ENCOUNTER — Ambulatory Visit
Admission: RE | Admit: 2020-09-24 | Discharge: 2020-09-24 | Disposition: A | Discharge status: Home / Self Care | Payer: Medicare Other | Source: Ambulatory Visit | Attending: Surgery | Admitting: Surgery

## 2020-09-24 ENCOUNTER — Ambulatory Visit (INDEPENDENT_AMBULATORY_CARE_PROVIDER_SITE_OTHER): Payer: Self-pay | Admitting: Surgery

## 2020-09-24 ENCOUNTER — Encounter: Payer: Self-pay | Admitting: Surgery

## 2020-09-24 ENCOUNTER — Other Ambulatory Visit: Payer: Self-pay

## 2020-09-24 VITALS — BP 132/82 | Ht 71.0 in | Wt 171.0 lb

## 2020-09-24 DIAGNOSIS — Z951 Presence of aortocoronary bypass graft: Secondary | ICD-10-CM

## 2020-09-24 DIAGNOSIS — J90 Pleural effusion, not elsewhere classified: Secondary | ICD-10-CM | POA: Diagnosis not present

## 2020-09-24 DIAGNOSIS — I2510 Atherosclerotic heart disease of native coronary artery without angina pectoris: Secondary | ICD-10-CM

## 2020-09-24 NOTE — Progress Notes (Signed)
HPI: Patient returns for routine postoperative follow-up having undergone coronary bypass graft surgery x4 and biatrial Maze procedure on 08/26/2020. The patient's early postoperative recovery while in the hospital was notable for an uncomplicated postoperative course. Since hospital discharge the patient reports that he has been feeling well.  He is walking about 1 mile daily without chest pain or shortness of breath.  He has not noted any tachypalpitations.   Current Outpatient Medications  Medication Sig Dispense Refill  . acetaminophen (TYLENOL) 500 MG tablet Take 1,000 mg by mouth daily as needed for moderate pain or headache.    . ALPRAZolam (XANAX) 0.5 MG tablet Take 0.25 mg by mouth at bedtime.     Marland Kitchen apixaban (ELIQUIS) 5 MG TABS tablet Take 1 tablet (5 mg total) by mouth 2 (two) times daily. 60 tablet   . aspirin EC 81 MG EC tablet Take 1 tablet (81 mg total) by mouth daily. Swallow whole. 30 tablet 11  . atenolol (TENORMIN) 25 MG tablet Take 0.5 tablets (12.5 mg total) by mouth daily. 60 tablet 3  . atorvastatin (LIPITOR) 40 MG tablet Take 1 tablet (40 mg total) by mouth daily. 30 tablet 11  . Cholecalciferol (VITAMIN D) 50 MCG (2000 UT) tablet Take 2,000 Units by mouth daily.    . Cyanocobalamin 1000 MCG/ML KIT Inject 1,000 mcg as directed every 30 (thirty) days.    . fluticasone (FLONASE) 50 MCG/ACT nasal spray Place 2 sprays into both nostrils 2 (two) times daily as needed for allergies or rhinitis.    Marland Kitchen omeprazole (PRILOSEC) 20 MG capsule Take 20 mg by mouth 2 (two) times daily as needed (for heartburn / acid reflux).     . QUEtiapine (SEROQUEL) 100 MG tablet Take 25 mg by mouth at bedtime. Pt only takes 1/4 tab     . tamsulosin (FLOMAX) 0.4 MG CAPS capsule Take 0.4 mg by mouth daily after breakfast.    . Misc Natural Products (OSTEO BI-FLEX ADV TRIPLE ST) TABS Take 1 tablet by mouth daily. (Patient not taking: Reported on 09/24/2020)     No current facility-administered  medications for this visit.    Physical Exam: BP 132/82   Ht 5' 11"  (1.803 m)   Wt 171 lb (77.6 kg)   BMI 23.85 kg/m  He looks well. Cardiac exam shows regular rate and rhythm with normal heart sounds. Lungs are clear. Chest incision is healing well and sternum is stable. His leg incisions are healing well and there is no peripheral edema.  Diagnostic Tests:  CLINICAL DATA:  Status post 4 vessel CABG  EXAM: CHEST - 2 VIEW  COMPARISON:  08/28/2020 chest radiograph.  FINDINGS: Intact sternotomy wires. CABG clips overlie the mediastinum. Stable cardiomediastinal silhouette with top-normal heart size. No pneumothorax. No right pleural effusion. Small left pleural effusion, slightly increased. No pulmonary edema. No acute consolidative airspace disease.  IMPRESSION: Small left pleural effusion, slightly increased. Otherwise no active disease.   Electronically Signed   By: Ilona Sorrel M.D.   On: 09/24/2020 10:43   Impression:  Overall he is making great progress following coronary bypass graft surgery and Maze procedure.  He saw Richardson Dopp in the office on 10 1 and electrocardiogram showed sinus rhythm at that time.  I encouraged him to continue walking as much as possible.  He is currently planning on participating in cardiac rehab.  I told him that he can return to driving a car but should refrain lifting anything heavier than 10 pounds  for 3 months postoperatively.  Plan:  He has a follow-up appoint with Dr. Johnsie Cancel in January and will return to see me if he has any problems with his incisions.   Gaye Pollack, MD [facility] [phone]

## 2020-10-07 DIAGNOSIS — Z23 Encounter for immunization: Secondary | ICD-10-CM | POA: Diagnosis not present

## 2020-10-09 ENCOUNTER — Other Ambulatory Visit: Payer: Self-pay

## 2020-10-09 MED ORDER — TAMSULOSIN HCL 0.4 MG PO CAPS
0.4000 mg | ORAL_CAPSULE | Freq: Every day | ORAL | 2 refills | Status: DC
Start: 2020-10-09 — End: 2020-12-22

## 2020-10-09 NOTE — Telephone Encounter (Signed)
  Prescription Request  10/09/2020  What is the name of the medication or equipment? tamsulosin  Have you contacted your pharmacy to request a refill? (if applicable) yes  Which pharmacy would you like this sent to? [facility]    Patient notified that their request is being sent to the clinical staff for review and that they should receive a response within 2 business days.

## 2020-10-12 ENCOUNTER — Telehealth: Payer: Self-pay

## 2020-10-12 ENCOUNTER — Telehealth: Payer: Self-pay | Admitting: Cardiovascular Disease

## 2020-10-12 NOTE — Telephone Encounter (Signed)
Patient contacted the office concerned about his blood pressure.  He stated that he was not taking much since discharge and was concerned he should be restarted on something.  Called back and spoke with patient's wife, who was advised that he should contact his Cardiologist for medication management.  She acknowledged receipt.

## 2020-10-12 NOTE — Telephone Encounter (Signed)
° °  Pt's wife calling she would like speak with Dr. Kyla Balzarine nurse, she said pt's BP is high and if he wants to see pt sooner than  January

## 2020-10-13 MED ORDER — LOSARTAN POTASSIUM 25 MG PO TABS: 25.0000 mg | ORAL_TABLET | Freq: Every day | ORAL | 3 refills | Status: DC

## 2020-10-13 NOTE — Telephone Encounter (Signed)
Called patient back about his message. Patient stated his BP has started running high on 10/04/20 (140/95, 163/102, 152/100, 141/90). Patient stated his HR has been good 82 to 90.  Patient stated after his CABG his losartan and HCTZ were discontinued due to his BP being low (102/83, 103/73). He is only taking atenolol 12.5 mg daily right now. Patient has been keeping to a low salt diet and running up to 3 miles a day on the treadmill. Patient is suppose to leave for [place] on Friday and wanted Dr. Kyla Balzarine advisement before he leaves.

## 2020-10-13 NOTE — Telephone Encounter (Signed)
Called patient back with Dr. Kyla Balzarine recommendations. Patient verbalized understanding and will start losartan 25 mg by mouth daily.

## 2020-10-13 NOTE — Telephone Encounter (Signed)
Can call in losartan 25 mg daily

## 2020-10-14 ENCOUNTER — Ambulatory Visit (INDEPENDENT_AMBULATORY_CARE_PROVIDER_SITE_OTHER): Payer: Medicare Other | Admitting: Family Medicine

## 2020-10-14 ENCOUNTER — Encounter: Payer: Self-pay | Admitting: Family Medicine

## 2020-10-14 ENCOUNTER — Other Ambulatory Visit: Payer: Self-pay

## 2020-10-14 VITALS — BP 146/93 | HR 88 | Temp 98.2°F | Ht 71.0 in | Wt 173.0 lb

## 2020-10-14 DIAGNOSIS — N401 Enlarged prostate with lower urinary tract symptoms: Secondary | ICD-10-CM | POA: Diagnosis not present

## 2020-10-14 DIAGNOSIS — I700 Atherosclerosis of aorta: Secondary | ICD-10-CM | POA: Diagnosis not present

## 2020-10-14 DIAGNOSIS — Z23 Encounter for immunization: Secondary | ICD-10-CM

## 2020-10-14 DIAGNOSIS — R3914 Feeling of incomplete bladder emptying: Secondary | ICD-10-CM

## 2020-10-14 DIAGNOSIS — Z951 Presence of aortocoronary bypass graft: Secondary | ICD-10-CM

## 2020-10-14 DIAGNOSIS — I10 Essential (primary) hypertension: Secondary | ICD-10-CM

## 2020-10-14 DIAGNOSIS — Z7901 Long term (current) use of anticoagulants: Secondary | ICD-10-CM | POA: Diagnosis not present

## 2020-10-14 NOTE — Progress Notes (Signed)
Subjective: CC: f/u atrial fibrillation, HTN PCP: [name], DO AXE:NMMHW E Joseph Larsen is a 74 y.o. male, who is accompanied by his wife today's visit.  He is presenting to clinic today for:  1.  Atrial fibrillation, hypertension, HLD, s/p CABG Continued on atenolol 12.5 mg, ASA and Eliquis 5 mg twice daily.  He underwent CABG since our last visit in September.  Losartan 147m and HCTZ stopped at that time for low blood pressures..Marland Kitchen He was recently restarted on low-dose losartan 25 mg daily because his blood pressures were elevated.  He has not yet picked up this medication.  He was started on Lipitor 40 mg daily and they requested c-Met and lipid within the next 3 months.  No chest pain, shortness of breath, edema, dizziness.  He reports good exercise tolerance and walks several miles per day with his spouse.  He has had no hematochezia, melena, epistaxis.  2.  BPH Patient has not yet scheduled his follow-up visit [facility] but will be doing that soon.  He would like to go had and add PSA to his labs in January in anticipation of this appointment.  He is compliant with Flomax and finds that it is very helpful.  3.  Preventative Patient is going to call Dr. SFuller Planto reschedule his colonoscopy now that his heart issues have been taken care of.  ROS: Per HPI  Allergies  Allergen Reactions  . Terazosin Rash    Rash when in sun  . Enalapril Maleate Rash  . Lisinopril Rash and Itching  . Penicillins Itching and Rash    35 years ago   . Adhesive [Tape] Rash    bandaids    Past Medical History:  Diagnosis Date  . Abnormal ECG    a. 08/2012 Abnl ETT with ST changes in recovery;  b. 08/2012 Ex MV, ex time 10:00, EF 66%, no ischemia/infarct.  . Allergic rhinitis   . Anxiety   . Arthritis   . Car[place] artery disease [place]    [place] bil[place] ICA 1-39   . Cataract   . Coronary artery disease    S/p CABG 9/21 (Dr. BCyndia Bent L-LAD, S-Dx, S-RI, S-OM)  . Depression   .  Diverticular disease   . Dyspnea on exertion   . ETOH abuse   . GERD (gastroesophageal reflux disease)   . Gout   . Hemorrhoids   . Hiatal hernia   . HTN (hypertension)   . Hyperlipidemia 09/10/2020  . Low testosterone   . MACROCYTIC ANEMIA   . NONSPEC ELEVATION OF LEVELS OF TRANSAMINASE/LDH   . Nonspecific abnormal finding in stool contents   . PAF (paroxysmal [place] fibrillation) ([place]    failed DCCV in 6/21 // Flecainide Rx >>DC'd 2/2 CAD // s/p Maze + LAA clipping in 9/21 // Apixaban  . Palpitations   . PTSD (post-traumatic stress disorder)    [place] [employer]    Current Outpatient Medications:  .  acetaminophen (TYLENOL) 500 MG tablet, Take 1,000 mg by mouth daily as needed for moderate pain or headache., Disp: , Rfl:  .  ALPRAZolam (XANAX) 0.5 MG tablet, Take 0.25 mg by mouth at bedtime. , Disp: , Rfl:  .  apixaban (ELIQUIS) 5 MG TABS tablet, Take 1 tablet (5 mg total) by mouth 2 (two) times daily., Disp: 60 tablet, Rfl:  .  aspirin EC 81 MG EC tablet, Take 1 tablet (81 mg total) by mouth daily. Swallow whole., Disp: 30 tablet, Rfl: 11 .  atenolol (TENORMIN) 25 MG tablet, Take 0.5 tablets (12.5 mg total) by mouth daily., Disp: 60 tablet, Rfl: 3 .  atorvastatin (LIPITOR) 40 MG tablet, Take 1 tablet (40 mg total) by mouth daily., Disp: 30 tablet, Rfl: 11 .  Cholecalciferol (VITAMIN D) 50 MCG (2000 UT) tablet, Take 2,000 Units by mouth daily., Disp: , Rfl:  .  Cyanocobalamin 1000 MCG/ML KIT, Inject 1,000 mcg as directed every 30 (thirty) days., Disp: , Rfl:  .  fluticasone (FLONASE) 50 MCG/ACT nasal spray, Place 2 sprays into both nostrils 2 (two) times daily as needed for allergies or rhinitis., Disp: , Rfl:  .  losartan (COZAAR) 25 MG tablet, Take 1 tablet (25 mg total) by mouth daily., Disp: 90 tablet, Rfl: 3 .  Misc Natural Products (OSTEO BI-FLEX ADV TRIPLE ST) TABS, Take 1 tablet by mouth daily. (Patient not taking: Reported on 09/24/2020), Disp: , Rfl:  .  omeprazole  (PRILOSEC) 20 MG capsule, Take 20 mg by mouth 2 (two) times daily as needed (for heartburn / acid reflux). , Disp: , Rfl:  .  QUEtiapine (SEROQUEL) 100 MG tablet, Take 25 mg by mouth at bedtime. Pt only takes 1/4 tab , Disp: , Rfl:  .  tamsulosin (FLOMAX) 0.4 MG CAPS capsule, Take 1 capsule (0.4 mg total) by mouth daily after breakfast., Disp: 30 capsule, Rfl: 2 Social History   Socioeconomic History  . Marital status: Married    Spouse name: Suanne Marker  . Number of children: 1  . Years of education: Not on file  . Highest education level: Not on file  Occupational History  . Occupation: disabled    Employer: RETIRED    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  Tobacco Use  . Smoking status: Former Smoker    Packs/day: 3.00    Types: Cigarettes    Start date: 02/09/1965    Quit date: 05/12/1982    Years since quitting: 38.4  . Smokeless tobacco: Never Used  Vaping Use  . Vaping Use: Never used  Substance and Sexual Activity  . Alcohol use: Not Currently    Alcohol/week: 14.0 standard drinks    Types: 14 Cans of beer per week    Comment: drinks 2-3 cans of beer/day    . Drug use: No    Comment: 1 cup of decaf coffee/day.  Marland Kitchen Sexual activity: Yes  Other Topics Concern  . Not on file  Social History Narrative   Lives with wife in [place] in a one story home.  Exercises @ gym 3x/wk - stationary bike and light weight training.   Social Determinants of Health   Financial Resource Strain:   . Difficulty of Paying Living Expenses: Not on file  Food Insecurity:   . Worried About Charity fundraiser in the Last Year: Not on file  . Ran Out of Food in the Last Year: Not on file  Transportation Needs:   . Lack of Transportation (Medical): Not on file  . Lack of Transportation (Non-Medical): Not on file  Physical Activity:   . Days of Exercise per Week: Not on file  . Minutes of Exercise per Session: Not on file  Stress:   . Feeling of Stress : Not on file  Social Connections:    . Frequency of Communication with Friends and Family: Not on file  . Frequency of Social Gatherings with Friends and Family: Not on file  . Attends Religious Services: Not on file  . Active Member of Clubs or Organizations: Not on file  .  Attends Archivist Meetings: Not on file  . Marital Status: Not on file  Intimate Partner Violence:   . Fear of Current or Ex-Partner: Not on file  . Emotionally Abused: Not on file  . Physically Abused: Not on file  . Sexually Abused: Not on file   Family History  Problem Relation Age of Onset  . Heart disease Mother        "died of chf"  . Heart disease Father        "died of old age"  . Hypertension Sister   . Liver disease Brother   . Hypertension Brother   . Hypertension Sister   . Hypertension Sister   . Hypertension Sister   . Heart murmur Sister   . Colon cancer Neg Hx     Objective: Office vital signs reviewed. BP (!) 146/93   Pulse 88   Temp 98.2 F (36.8 C) (Temporal)   Ht _0  (1.803 m)   Wt 173 lb (78.5 kg)   SpO2 98%   BMI 24.13 kg/m   Physical Examination:  General: Awake, alert, well nourished, well appearing male, No acute distress HEENT: Normal, sclera white  Cardio: Regular rate and rhythm.  S1-S2 heard.  No murmurs. Pulm: clear to auscultation bilaterally, no wheezes, rhonchi or rales; normal work of breathing on room air Extremities: warm, well perfused, No edema, cyanosis or clubbing; +2 pulses bilaterally  Depression screen [facility] 2/9 10/14/2020 06/18/2020 04/13/2020  Decreased Interest 0 0 0  Down, Depressed, Hopeless 0 0 0  PHQ - 2 Score 0 0 0  Altered sleeping 0 - -  Tired, decreased energy 0 - -  Change in appetite 0 - -  Feeling bad or failure about yourself  0 - -  Trouble concentrating 0 - -  Moving slowly or fidgety/restless 0 - -  Suicidal thoughts 0 - -  PHQ-9 Score 0 - -  Difficult doing work/chores - - -  Some recent data might be hidden     Assessment/ Plan: 74 y.o. male    1. Essential hypertension Not controlled but has not yet started losartan 25 mg daily.  He will pick this up today. - CMP14+EGFR; Future  2. Aortic atherosclerosis ([place]) I will place CMP and fasting lipid panel as a future order for this patient.  This is been CCed to his cardiologist for follow-up in January as well - CMP14+EGFR; Future - Lipid panel; Future  3. S/P CABG x 4 Doing well status post CABG. he is very antsy to get back to normal physical activity but understands the need to allow the heart to heal.  Follow-up scheduled with cardiology in January - CMP14+EGFR; Future - Lipid panel; Future  4. Benign prostatic hyperplasia with incomplete bladder emptying Symptoms well controlled with Flomax.  PSA added to labs.  [facility] to [facility] - PSA; Future  5. Chronic anticoagulation No red flags.  CBC ordered - CBC; Future  6. Need for immunization against influenza Administered - Flu Vaccine QUAD High Dose(Fluad)    No orders of the defined types were placed in this encounter.  No orders of the defined types were placed in this encounter.    [name], DO [place] [phone]

## 2020-10-14 NOTE — Patient Instructions (Signed)
Future labs are in. Will send copy to Dr Johnsie Cancel.

## 2020-10-21 ENCOUNTER — Telehealth: Payer: Self-pay | Admitting: Cardiovascular Disease

## 2020-10-21 MED ORDER — LOSARTAN POTASSIUM 100 MG PO TABS: 100.0000 mg | ORAL_TABLET | Freq: Every day | ORAL | 3 refills | Status: AC

## 2020-10-21 MED ORDER — ATENOLOL 25 MG PO TABS
25.0000 mg | ORAL_TABLET | Freq: Every day | ORAL | 3 refills | Status: DC
Start: 2020-10-21 — End: 2020-10-27

## 2020-10-21 NOTE — Telephone Encounter (Signed)
Called patient back about message. Patient stated his BP has been going higher since 10/24. Patient stated SBP running 155 plus, and DBP is running low 100's. HR is running between 88 to 106. Patient stated he has been taking an extra dose of losartan and it is not helping. Patient is on Losartan 25 mg daily and Atenolol 12.5 mg daily.  Patient denies SOB, but stated he has chest discomfort when BP is high. Will forward to Dr. Johnsie Cancel for advisement.

## 2020-10-21 NOTE — Telephone Encounter (Signed)
Called patient back with recommendations. Will send medication changes to patient's pharmacy. Patient verbalized understanding.

## 2020-10-21 NOTE — Telephone Encounter (Signed)
Pt c/o BP issue: STAT if pt c/o blurred vision, one-sided weakness or slurred speech  1. What are your last 5 BP readings? 155/103 pulse 89  2. Are you having any other symptoms (ex. Dizziness, headache, blurred vision, passed out)? no  3. What is your BP issue? When it is up patient has some discomfort in chest.

## 2020-10-21 NOTE — Telephone Encounter (Signed)
Increase losartan to 100 mg and atenolol to 25 mg can f/u with Nicki Reaper next week he saw him last Make sure he has SL nitro

## 2020-10-27 ENCOUNTER — Telehealth: Payer: Self-pay | Admitting: Cardiovascular Disease

## 2020-10-27 MED ORDER — ATENOLOL 50 MG PO TABS
50.0000 mg | ORAL_TABLET | Freq: Every day | ORAL | 3 refills | Status: DC
Start: 2020-10-27 — End: 2020-11-10

## 2020-10-27 MED ORDER — ISOSORBIDE MONONITRATE ER 30 MG PO TB24: 30.0000 mg | ORAL_TABLET | Freq: Every day | ORAL | 3 refills | Status: AC

## 2020-10-27 NOTE — Telephone Encounter (Signed)
Follow Up:    Pt said after increasing his medicine on 10-21-20, blood pressure is still.high.   Pt c/o BP issue: STAT if pt c/o blurred vision, one-sided weakness or slurred speech  1. What are your last 5 BP readings?  148/104 today  144/96 and 152/88 other 152/89, 155/101 142/100  2. Are you having any other symptoms (ex. Dizziness, headache, blurred vision, passed out)? Little tightness in his chest  3. What is your BP issue? Blood pressure is running high

## 2020-10-27 NOTE — Telephone Encounter (Signed)
Called patient back about his message. Patient complaining of elevated BP and light chest tightness. Patient's SBP 140- 150's and DBP 88 to 104, HR 80's to 90's. Patient currently taking losartan 100 mg and Atenolol 25 mg daily. Will forward to Dr. Johnsie Cancel for advisement.

## 2020-10-27 NOTE — Telephone Encounter (Signed)
Called patient back with Dr. Kyla Balzarine recommendations. Sent in new prescription to patient's pharmacy. Patient will see Richardson Dopp PA on 11/30, next available. Patient will call back with any other questions or concerns.

## 2020-10-27 NOTE — Telephone Encounter (Signed)
Increase atenolol to 50 mg add imdur 30 mg daily f/u with PA this week or Scott next week

## 2020-11-10 ENCOUNTER — Ambulatory Visit (INDEPENDENT_AMBULATORY_CARE_PROVIDER_SITE_OTHER): Payer: Medicare Other | Admitting: Physician Assistant

## 2020-11-10 ENCOUNTER — Other Ambulatory Visit: Payer: Self-pay

## 2020-11-10 ENCOUNTER — Encounter: Payer: Self-pay | Admitting: Physician Assistant

## 2020-11-10 VITALS — BP 130/80 | HR 75 | Ht 71.0 in | Wt 179.0 lb

## 2020-11-10 DIAGNOSIS — I6523 Occlusion and stenosis of bilateral carotid arteries: Secondary | ICD-10-CM | POA: Diagnosis not present

## 2020-11-10 DIAGNOSIS — I25119 Atherosclerotic heart disease of native coronary artery with unspecified angina pectoris: Secondary | ICD-10-CM

## 2020-11-10 DIAGNOSIS — I4819 Other persistent atrial fibrillation: Secondary | ICD-10-CM

## 2020-11-10 DIAGNOSIS — E785 Hyperlipidemia, unspecified: Secondary | ICD-10-CM

## 2020-11-10 DIAGNOSIS — R072 Precordial pain: Secondary | ICD-10-CM

## 2020-11-10 DIAGNOSIS — I10 Essential (primary) hypertension: Secondary | ICD-10-CM

## 2020-11-10 MED ORDER — SPIRONOLACTONE 25 MG PO TABS: 25.0000 mg | ORAL_TABLET | Freq: Every day | ORAL | 3 refills | Status: DC

## 2020-11-10 NOTE — Patient Instructions (Signed)
Medication Instructions:  Your physician has recommended you make the following change in your medication:   1) Start Spironolactone 25 mg, 1 tablet by mouth once a day  *If you need a refill on your cardiac medications before your next appointment, please call your pharmacy*  Lab Work: Your physician recommends that you return for lab work in 1 week for a BMET and 2 weeks for a BMET  If you have labs (blood work) drawn today and your tests are completely normal, you will receive your results only by: Marland Kitchen MyChart Message (if you have MyChart) OR . A paper copy in the mail If you have any lab test that is abnormal or we need to change your treatment, we will call you to review the results.  Testing/Procedures: Your physician has requested that you have an echocardiogram. Echocardiography is a painless test that uses sound waves to create images of your heart. It provides your doctor with information about the size and shape of your heart and how well your heart's chambers and valves are working. This procedure takes approximately one hour. There are no restrictions for this procedure.  Follow-Up: Keep appointment on 12/22/2020 at 9:15AM with Jenkins Rouge, MD

## 2020-11-10 NOTE — Progress Notes (Signed)
Cardiology Office Note:    Date:  11/10/2020   ID:  Joseph Larsen, DOB 07-Apr-1946, MRN [id number]  PCP:  [name], DO  [facility] Cardiologist:  Jenkins Rouge, MD   [facility] Electrophysiologist:  None   Referring MD: [name], DO   Chief Complaint:  Hypertension    Patient Profile:    Joseph Larsen is a 74 y.o. male with:   Coronary artery disease  ? S/p CABG 9/21 (Dr. Cyndia Bent; L-LAD, S-Dx, S-RI, S-OM)  Paroxysmal>>persistent atrial fibrillation ? S/p DCCV 05/15/20 >> ERAF ? Flecainide started 6/21>>DCd 2/2 coronary artery disease  ? ETT-Myoview - no ischemia; no VT ? DCCV 06/01/20 >> NSR ? S/p bi-atrial MAZE and LAA clipping 9/21  Hypertension  Hyperlipidemia  Alcohol abuse  Echo 6/19: EF 60-65  Echo 5/21: EF 55-60, LAE, LVH  Carotid artery Dz  ? [place] 9/21: bilat ICA 1-39    Prior CV studies: Carotid US 08/20/20 B/L ICA 1-39  Cardiac catheterization 08/07/20 LM mid 50 LAD prox 90 RI 90 LCx ost 70, mid 50 RCA prox 30 EF 55-65  Coronary CTA 07/21/20 Ca score 3159 (95th percentile) 1. Left Main: No significant stenosis. FFR = 0.89 2. LAD: Significant stenosis. FFR 0.76 at takeoff of first diagonal, Mid FFR = 0.66, Distal FFR = 0.58 3. LCX: No significant stenosis. Proximal FFR = 0.86 4. RCA: Unable to be interpreted due to artifact. IMPRESSION: 1. CT FFR analysis showed significant stenosis in the LAD as noted, beginning near the takeoff of the first significant diagonal and extending distally.  ETT-Myoview 05/29/2020 Small inferior apical wall infarct, no ischemia, not gated due to atrial fibrillation, low risk ETT portion normal  Event monitor 05/04/2020 Atrial fibrillation, average heart rate 81, 3.1-second pause, <1% PVCs  Echocardiogram 04/23/2020 EF 55-60, no RWMA, mild LVH, normal RV SF, severe LAE, mildly dilated aortic root (39 mm), dilated ascending aorta (40 mm)  History of Present Illness:    Joseph Larsen  was last seen in October 2021 in follow-up after his CABG.  He called in recently with elevated blood pressures and associated chest tightness.  His atenolol was increased to 50 mg and isosorbide 30 mg daily was added to his medical regimen.  He returns for follow-up.    He is here today with his wife.  He has had numbness and heaviness on the left side of his chest since his surgery.  This is gotten somewhat better over time.  He can feel discomfort with certain types of movement.  He exercises on his own without significant chest discomfort.  He has not had significant shortness of breath.  He has not had pleuritic chest symptoms.  He has not had orthopnea, leg edema or syncope.      Past Medical History:  Diagnosis Date  . Abnormal ECG    a. 08/2012 Abnl ETT with ST changes in recovery;  b. 08/2012 Ex MV, ex time 10:00, EF 66%, no ischemia/infarct.  . Allergic rhinitis   . Anxiety   . Arthritis   . Carotid artery disease ([place])    [place] 9/21: bilat ICA 1-39   . Cataract   . Coronary artery disease    S/p CABG 9/21 (Dr. Cyndia Bent; L-LAD, S-Dx, S-RI, S-OM)  . Depression   . Diverticular disease   . Dyspnea on exertion   . ETOH abuse   . GERD (gastroesophageal reflux disease)   . Gout   . Hemorrhoids   . Hiatal hernia   .  HTN (hypertension)   . Hyperlipidemia 09/10/2020  . Low testosterone   . MACROCYTIC ANEMIA   . NONSPEC ELEVATION OF LEVELS OF TRANSAMINASE/LDH   . Nonspecific abnormal finding in stool contents   . PAF (paroxysmal atrial fibrillation) ([place])    failed DCCV in 6/21 // Flecainide Rx >>DC'd 2/2 CAD // s/p Maze + LAA clipping in 9/21 // Apixaban  . Palpitations   . PTSD (post-traumatic stress disorder)    [name]    Current Medications: Current Meds  Medication Sig  . acetaminophen (TYLENOL) 500 MG tablet Take 1,000 mg by mouth daily as needed for moderate pain or headache.  . ALPRAZolam (XANAX) 0.5 MG tablet Take 0.25 mg by mouth at bedtime.   Marland Kitchen apixaban  (ELIQUIS) 5 MG TABS tablet Take 1 tablet (5 mg total) by mouth 2 (two) times daily.  Marland Kitchen aspirin EC 81 MG EC tablet Take 1 tablet (81 mg total) by mouth daily. Swallow whole.  Marland Kitchen atenolol (TENORMIN) 25 MG tablet Take 25 mg by mouth daily.  Marland Kitchen atorvastatin (LIPITOR) 40 MG tablet Take 1 tablet (40 mg total) by mouth daily.  . Cholecalciferol (VITAMIN D) 50 MCG (2000 UT) tablet Take 2,000 Units by mouth daily.  . Cyanocobalamin 1000 MCG/ML KIT Inject 1,000 mcg as directed every 30 (thirty) days.  . fluticasone (FLONASE) 50 MCG/ACT nasal spray Place 2 sprays into both nostrils 2 (two) times daily as needed for allergies or rhinitis.  Marland Kitchen isosorbide mononitrate (IMDUR) 30 MG 24 hr tablet Take 1 tablet (30 mg total) by mouth daily.  Marland Kitchen losartan (COZAAR) 100 MG tablet Take 1 tablet (100 mg total) by mouth daily.  Marland Kitchen omeprazole (PRILOSEC) 20 MG capsule Take 20 mg by mouth 2 (two) times daily as needed (for heartburn / acid reflux).   . QUEtiapine (SEROQUEL) 100 MG tablet Take 25 mg by mouth at bedtime. Pt only takes 1/4 tab   . tamsulosin (FLOMAX) 0.4 MG CAPS capsule Take 1 capsule (0.4 mg total) by mouth daily after breakfast.  . [DISCONTINUED] atenolol (TENORMIN) 50 MG tablet Take 1 tablet (50 mg total) by mouth daily. (Patient taking differently: Take 25 mg by mouth daily. )  . [DISCONTINUED] Misc Natural Products (OSTEO BI-FLEX ADV TRIPLE ST) TABS Take 1 tablet by mouth daily.      Allergies:   Terazosin, Enalapril maleate, Lisinopril, Penicillins, and Adhesive [tape]   Social History   Tobacco Use  . Smoking status: Former Smoker    Packs/day: 3.00    Types: Cigarettes    Start date: 02/09/1965    Quit date: 05/12/1982    Years since quitting: 38.5  . Smokeless tobacco: Never Used  Vaping Use  . Vaping Use: Never used  Substance Use Topics  . Alcohol use: Not Currently    Alcohol/week: 14.0 standard drinks    Types: 14 Cans of beer per week    Comment: drinks 2-3 cans of beer/day    . Drug use:  No    Comment: 1 cup of decaf coffee/day.     Family Hx: The patient's family history includes Heart disease in his father and mother; Heart murmur in his sister; Hypertension in his brother, sister, sister, sister, and sister; Liver disease in his brother. There is no history of Colon cancer.  ROS   EKGs/Labs/Other Test Reviewed:    EKG:  EKG is   ordered today.  The ekg ordered today demonstrates normal sinus rhythm, heart rate 75, inferior Q waves, first-degree AV  block, PR interval 216, QTC 477, nonspecific ST-T wave changes, increased artifact  Recent Labs: 04/13/2020: TSH 2.560 08/20/2020: ALT 22 08/27/2020: Magnesium 2.0 08/30/2020: BUN 9; Creatinine, Ser 0.57; Hemoglobin 10.5; Platelets 148; Potassium 3.7; Sodium 133   Recent Lipid Panel Lab Results  Component Value Date/Time   CHOL 98 (L) 04/13/2020 08:39 AM   TRIG 68 04/13/2020 08:39 AM   TRIG 82 03/25/2015 09:50 AM   HDL 28 (L) 04/13/2020 08:39 AM   HDL 38 (L) 03/25/2015 09:50 AM   CHOLHDL 3.5 04/13/2020 08:39 AM   LDLCALC 55 04/13/2020 08:39 AM   LDLCALC 54 07/17/2014 08:28 AM   LDLDIRECT 74 01/03/2017 11:00 AM      Risk Assessment/Calculations:     CHA2DS2-VASc Score = 3  This indicates a 3.2% annual risk of stroke. The patient's score is based upon: CHF History: 0 HTN History: 1 Diabetes History: 0 Stroke History: 0 Vascular Disease History: 1 Age Score: 1 Gender Score: 0     Physical Exam:    VS:  BP 130/80   Pulse 75   Ht 5' 11"  (1.803 m)   Wt 179 lb (81.2 kg)   SpO2 97%   BMI 24.97 kg/m     Wt Readings from Last 3 Encounters:  11/10/20 179 lb (81.2 kg)  10/14/20 173 lb (78.5 kg)  09/24/20 171 lb (77.6 kg)     Constitutional:      Appearance: Healthy appearance. Not in distress.  Neck:     Vascular: JVD normal.  Pulmonary:     Effort: Pulmonary effort is normal.     Breath sounds: No wheezing. No rales.  Cardiovascular:     Normal rate. Regular rhythm. Normal S1. Normal S2.      Murmurs: There is no murmur.  Edema:    Pretibial: bilateral trace edema of the pretibial area. Abdominal:     Palpations: Abdomen is soft. There is no hepatomegaly.  Skin:    General: Skin is warm and dry.  Neurological:     General: No focal deficit present.     Mental Status: Alert and oriented to person, place and time.     Cranial Nerves: Cranial nerves are intact.      ASSESSMENT & PLAN:    1. Essential hypertension He brings in a detailed list of his blood pressures over the past 6 weeks.  His blood pressures still remain above target.  He was previously on HCTZ and potassium prior to his surgery.  I suppose this was stopped after his surgery secondary to low blood pressure.  He does have some increased salt in his diet.  He does have bacon on occasion and had a ham biscuit this morning.  He does eat canned vegetables.  We discussed the importance of limiting salt in his diet.  I have recommended placing him back on a diuretic.  We discussed resuming HCTZ and potassium versus starting on spironolactone.  He prefers to take spironolactone.  -Start spironolactone 25 mg daily  -Obtain BMET once weekly x2  -If his blood pressure starts to run too low, we can stop the isosorbide.  2. Coronary artery disease involving native coronary artery of native heart with angina pectoris ([place]) 3. Precordial pain Status post CABG 08/2020.  He has a lot of numbness in his chest since his surgery.  I suspect he is still feeling musculoskeletal discomfort related to his surgery.  However, this has persisted.  I have recommended proceeding with an echocardiogram to rule out  the possibility of pericardial effusion.  If his symptoms continue or worsen, we may need to consider stress testing.  Keep follow-up with Dr. Johnsie Cancel in January.  Continue aspirin, atorvastatin.  4. Persistent atrial fibrillation ([facility]) Maintaining sinus rhythm.  He is status post Maze procedure and atrial appendage clipping.  He is  tolerating anticoagulation.  Continue Apixaban  5. Hyperlipidemia, unspecified hyperlipidemia type LDL optimal on most recent lab work.  Continue current Rx.       Dispo:  No follow-ups on file.   Medication Adjustments/Labs and Tests Ordered: Current medicines are reviewed at length with the patient today.  Concerns regarding medicines are outlined above.  Tests Ordered: Orders Placed This Encounter  Procedures  . Basic metabolic panel  . Basic metabolic panel  . EKG 12-Lead  . ECHOCARDIOGRAM COMPLETE   Medication Changes: Meds ordered this encounter  Medications  . spironolactone (ALDACTONE) 25 MG tablet    Sig: Take 1 tablet (25 mg total) by mouth daily.    Dispense:  90 tablet    Refill:  3    Signed, Richardson Dopp, PA-C  11/10/2020 4:44 PM    [facility] [place], [place], [place]  [id number] Phone: [phone]; Fax: [phone]

## 2020-11-18 ENCOUNTER — Other Ambulatory Visit: Payer: Self-pay

## 2020-11-18 ENCOUNTER — Other Ambulatory Visit: Payer: Medicare Other

## 2020-11-18 DIAGNOSIS — I4819 Other persistent atrial fibrillation: Secondary | ICD-10-CM

## 2020-11-18 DIAGNOSIS — I700 Atherosclerosis of aorta: Secondary | ICD-10-CM | POA: Diagnosis not present

## 2020-11-18 DIAGNOSIS — Z951 Presence of aortocoronary bypass graft: Secondary | ICD-10-CM

## 2020-11-18 DIAGNOSIS — Z7901 Long term (current) use of anticoagulants: Secondary | ICD-10-CM

## 2020-11-18 DIAGNOSIS — I10 Essential (primary) hypertension: Secondary | ICD-10-CM

## 2020-11-18 DIAGNOSIS — R3914 Feeling of incomplete bladder emptying: Secondary | ICD-10-CM | POA: Diagnosis not present

## 2020-11-18 DIAGNOSIS — N401 Enlarged prostate with lower urinary tract symptoms: Secondary | ICD-10-CM

## 2020-11-18 DIAGNOSIS — I25119 Atherosclerotic heart disease of native coronary artery with unspecified angina pectoris: Secondary | ICD-10-CM

## 2020-11-19 LAB — CMP14+EGFR
ALT: 32 IU/L (ref 0–44)
AST: 28 IU/L (ref 0–40)
Albumin/Globulin Ratio: 1.9 (ref 1.2–2.2)
Albumin: 4.3 g/dL (ref 3.7–4.7)
Alkaline Phosphatase: 82 IU/L (ref 44–121)
BUN/Creatinine Ratio: 13 (ref 10–24)
BUN: 10 mg/dL (ref 8–27)
Bilirubin Total: 0.7 mg/dL (ref 0.0–1.2)
CO2: 27 mmol/L (ref 20–29)
Calcium: 9.2 mg/dL (ref 8.6–10.2)
Chloride: 102 mmol/L (ref 96–106)
Creatinine, Ser: 0.78 mg/dL (ref 0.76–1.27)
GFR calc Af Amer: 104 mL/min/{1.73_m2} (ref >59)
GFR calc non Af Amer: 90 mL/min/{1.73_m2} (ref >59)
Globulin, Total: 2.3 g/dL (ref 1.5–4.5)
Glucose: 80 mg/dL (ref 65–99)
Potassium: 3.8 mmol/L (ref 3.5–5.2)
Sodium: 141 mmol/L (ref 134–144)
Total Protein: 6.6 g/dL (ref 6.0–8.5)

## 2020-11-19 LAB — LIPID PANEL
Chol/HDL Ratio: 2.4 ratio (ref 0.0–5.0)
Cholesterol, Total: 77 mg/dL — ABNORMAL LOW (ref 100–199)
HDL: 32 mg/dL — ABNORMAL LOW (ref >39)
LDL Chol Calc (NIH): 34 mg/dL (ref 0–99)
Triglycerides: 37 mg/dL (ref 0–149)
VLDL Cholesterol Cal: 11 mg/dL (ref 5–40)

## 2020-11-19 LAB — CBC
Hematocrit: 41.1 % (ref 37.5–51.0)
Hemoglobin: 14.5 g/dL (ref 13.0–17.7)
MCH: 31.8 pg (ref 26.6–33.0)
MCHC: 35.3 g/dL (ref 31.5–35.7)
MCV: 90 fL (ref 79–97)
Platelets: 152 10*3/uL (ref 150–450)
RBC: 4.56 x10E6/uL (ref 4.14–5.80)
RDW: 12.8 % (ref 11.6–15.4)
WBC: 5.5 10*3/uL (ref 3.4–10.8)

## 2020-11-19 LAB — PSA: Prostate Specific Ag, Serum: 0.7 ng/mL (ref 0.0–4.0)

## 2020-12-02 ENCOUNTER — Other Ambulatory Visit: Payer: Self-pay

## 2020-12-02 ENCOUNTER — Other Ambulatory Visit: Payer: Medicare Other

## 2020-12-02 DIAGNOSIS — I10 Essential (primary) hypertension: Secondary | ICD-10-CM

## 2020-12-02 DIAGNOSIS — I25119 Atherosclerotic heart disease of native coronary artery with unspecified angina pectoris: Secondary | ICD-10-CM

## 2020-12-02 DIAGNOSIS — I4819 Other persistent atrial fibrillation: Secondary | ICD-10-CM | POA: Diagnosis not present

## 2020-12-02 LAB — BASIC METABOLIC PANEL
BUN/Creatinine Ratio: 16 (ref 10–24)
BUN: 13 mg/dL (ref 8–27)
CO2: 23 mmol/L (ref 20–29)
Calcium: 9.5 mg/dL (ref 8.6–10.2)
Chloride: 103 mmol/L (ref 96–106)
Creatinine, Ser: 0.82 mg/dL (ref 0.76–1.27)
GFR calc Af Amer: 101 mL/min/{1.73_m2} (ref >59)
GFR calc non Af Amer: 88 mL/min/{1.73_m2} (ref >59)
Glucose: 79 mg/dL (ref 65–99)
Potassium: 4 mmol/L (ref 3.5–5.2)
Sodium: 142 mmol/L (ref 134–144)

## 2020-12-07 ENCOUNTER — Encounter: Payer: Self-pay | Admitting: Physician Assistant

## 2020-12-07 ENCOUNTER — Other Ambulatory Visit: Payer: Self-pay

## 2020-12-07 ENCOUNTER — Ambulatory Visit (HOSPITAL_COMMUNITY): Payer: Medicare Other | Attending: Cardiology

## 2020-12-07 DIAGNOSIS — I25119 Atherosclerotic heart disease of native coronary artery with unspecified angina pectoris: Secondary | ICD-10-CM | POA: Diagnosis not present

## 2020-12-07 DIAGNOSIS — I4819 Other persistent atrial fibrillation: Secondary | ICD-10-CM | POA: Insufficient documentation

## 2020-12-07 LAB — ECHOCARDIOGRAM COMPLETE
Area-P 1/2: 3.01 cm2
S' Lateral: 2.5 cm

## 2020-12-15 NOTE — Progress Notes (Signed)
Cardiology Office Note:    Date:  12/22/2020   ID:  Joseph Larsen, DOB 1946/11/21, MRN [id number]  PCP:  [name], DO  [facility] Cardiologist:  Jenkins Rouge, MD   [place] Electrophysiologist:  None      Patient Profile:    Joseph Larsen is a 75 y.o. male with:   Coronary artery disease  ? S/p CABG 9/21 (Dr. Cyndia Bent; L-LAD, S-Dx, S-RI, S-OM)  Paroxysmal>>persistent atrial fibrillation ? S/p DCCV 05/15/20 >> ERAF ? Flecainide started 6/21>>DCd 2/2 coronary artery disease  ? ETT-Myoview - no ischemia; no VT ? DCCV 06/01/20 >> NSR ? S/p bi-atrial MAZE and LAA clipping 9/21  Hypertension  Hyperlipidemia  Alcohol abuse  Echo 6/19: EF 60-65  Echo 5/21: EF 55-60, LAE, LVH  Echo 12/07/20 EF 60-65% aortic root 4.3 m  Carotid artery Dz  ? [place] 9/21: bilat ICA 1-39    Prior CV studies: Carotid US 08/20/20 B/L ICA 1-39  Cardiac catheterization 08/07/20 LM mid 50 LAD prox 90 RI 90 LCx ost 70, mid 50 RCA prox 30 EF 55-65  Coronary CTA 07/21/20 Ca score 3159 (95th percentile) 1. Left Main: No significant stenosis. FFR = 0.89 2. LAD: Significant stenosis. FFR 0.76 at takeoff of first diagonal, Mid FFR = 0.66, Distal FFR = 0.58 3. LCX: No significant stenosis. Proximal FFR = 0.86 4. RCA: Unable to be interpreted due to artifact. IMPRESSION: 1. CT FFR analysis showed significant stenosis in the LAD as noted, beginning near the takeoff of the first significant diagonal and extending distally.  ETT-Myoview 05/29/2020 Small inferior apical wall infarct, no ischemia, not gated due to atrial fibrillation, low risk ETT portion normal  Event monitor 05/04/2020 Atrial fibrillation, average heart rate 81, 3.1-second pause, <1% PVCs  Echocardiogram 04/23/2020 EF 55-60, no RWMA, mild LVH, normal RV SF, severe LAE, mildly dilated aortic root (39 mm), dilated ascending aorta (40 mm)  History of Present Illness:    75 y.o. f/u CAD/CABG August 26 2020  see above Has done well post op echo with normal EF and aortic root 4.3 cm he has had PAF and been on flecainide but this was stopped when his CAD diagnosed Has been on eliquis   Recovering well Sold his Harley No palpitations and no bleeding issues on eliquis   Past Medical History:  Diagnosis Date  . Abnormal ECG    a. 08/2012 Abnl ETT with ST changes in recovery;  b. 08/2012 Ex MV, ex time 10:00, EF 66%, no ischemia/infarct.  . Allergic rhinitis   . Anxiety   . Arthritis   . Carotid artery disease ([place])    [place] 9/21: bilat ICA 1-39   . Cataract   . Coronary artery disease    S/p CABG 9/21 (Dr. Cyndia Bent; L-LAD, S-Dx, S-RI, S-OM)  . Depression   . Diverticular disease   . Dyspnea on exertion   . Echocardiogram    Echocardiogram 12/21: EF 60-65, no RWMA, mod asymmetric basal-septal LVH, normal diastolic fn, normal RVSF, RVSP 26, mild MR, mild AV sclerosis (no AS), dilated aortic root (43 mm), dilated ascending aorta (43 mm), no effusion   . ETOH abuse   . GERD (gastroesophageal reflux disease)   . Gout   . Hemorrhoids   . Hiatal hernia   . HTN (hypertension)   . Hyperlipidemia 09/10/2020  . Low testosterone   . MACROCYTIC ANEMIA   . NONSPEC ELEVATION OF LEVELS OF TRANSAMINASE/LDH   . Nonspecific abnormal finding in stool contents   .  PAF (paroxysmal atrial fibrillation) ([place])    failed DCCV in 6/21 // Flecainide Rx >>DC'd 2/2 CAD // s/p Maze + LAA clipping in 9/21 // Apixaban  . Palpitations   . PTSD (post-traumatic stress disorder)    [name]    Current Medications: Current Meds  Medication Sig  . acetaminophen (TYLENOL) 500 MG tablet Take 1,000 mg by mouth daily as needed for moderate pain or headache.  . ALPRAZolam (XANAX) 0.5 MG tablet Take 0.25 mg by mouth at bedtime.   Marland Kitchen aspirin EC 81 MG EC tablet Take 1 tablet (81 mg total) by mouth daily. Swallow whole.  Marland Kitchen atenolol (TENORMIN) 25 MG tablet Take 25 mg by mouth daily.  Marland Kitchen atorvastatin (LIPITOR) 40 MG tablet Take  1 tablet (40 mg total) by mouth daily.  . Cholecalciferol (VITAMIN D) 50 MCG (2000 UT) tablet Take 2,000 Units by mouth daily.  . Cyanocobalamin 1000 MCG/ML KIT Inject 1,000 mcg as directed every 30 (thirty) days.  . fluticasone (FLONASE) 50 MCG/ACT nasal spray Place 2 sprays into both nostrils 2 (two) times daily as needed for allergies or rhinitis.  Marland Kitchen isosorbide mononitrate (IMDUR) 30 MG 24 hr tablet Take 1 tablet (30 mg total) by mouth daily.  Marland Kitchen losartan (COZAAR) 100 MG tablet Take 1 tablet (100 mg total) by mouth daily.  Marland Kitchen omeprazole (PRILOSEC) 20 MG capsule Take 20 mg by mouth 2 (two) times daily as needed (for heartburn / acid reflux).   . QUEtiapine (SEROQUEL) 100 MG tablet Take 25 mg by mouth at bedtime. Pt only takes 1/4 tab  . spironolactone (ALDACTONE) 25 MG tablet Take 1 tablet (25 mg total) by mouth daily.  . tamsulosin (FLOMAX) 0.4 MG CAPS capsule Take 1 capsule (0.4 mg total) by mouth daily after breakfast.     Allergies:   Terazosin, Enalapril maleate, Lisinopril, Penicillins, and Adhesive [tape]   Social History   Tobacco Use  . Smoking status: Former Smoker    Packs/day: 3.00    Types: Cigarettes    Start date: 02/09/1965    Quit date: 05/12/1982    Years since quitting: 38.6  . Smokeless tobacco: Never Used  Vaping Use  . Vaping Use: Never used  Substance Use Topics  . Alcohol use: Not Currently    Alcohol/week: 14.0 standard drinks    Types: 14 Cans of beer per week    Comment: drinks 2-3 cans of beer/day    . Drug use: No    Comment: 1 cup of decaf coffee/day.     Family Hx: The patient's family history includes Heart disease in his father and mother; Heart murmur in his sister; Hypertension in his brother, sister, sister, sister, and sister; Liver disease in his brother. There is no history of Colon cancer.  ROS   EKGs/Labs/Other Test Reviewed:    EKG:   SR rate 75 11/10/20   Recent Labs: 04/13/2020: TSH 2.560 08/27/2020: Magnesium 2.0 11/18/2020: ALT 32;  Hemoglobin 14.5; Platelets 152 12/02/2020: BUN 13; Creatinine, Ser 0.82; Potassium 4.0; Sodium 142   Recent Lipid Panel Lab Results  Component Value Date/Time   CHOL 77 (L) 11/18/2020 10:05 AM   TRIG 37 11/18/2020 10:05 AM   TRIG 82 03/25/2015 09:50 AM   HDL 32 (L) 11/18/2020 10:05 AM   HDL 38 (L) 03/25/2015 09:50 AM   CHOLHDL 2.4 11/18/2020 10:05 AM   LDLCALC 34 11/18/2020 10:05 AM   LDLCALC 54 07/17/2014 08:28 AM   LDLDIRECT 74 01/03/2017 11:00 AM  Risk Assessment/Calculations:     CHA2DS2-VASc Score = 3  This indicates a 3.2% annual risk of stroke. The patient's score is based upon: CHF History: No HTN History: Yes Diabetes History: No Stroke History: No Vascular Disease History: Yes Age Score: 1 Gender Score: 0    Physical Exam:    VS:  BP 116/62   Pulse 72   Ht 5' 11"  (1.803 m)   Wt 78 kg   SpO2 98%   BMI 23.99 kg/m     Wt Readings from Last 3 Encounters:  12/22/20 78 kg  11/10/20 81.2 kg  10/14/20 78.5 kg     Affect appropriate Healthy:  appears stated age 77: normal Neck supple with no adenopathy JVP normal no bruits no thyromegaly Lungs clear with no wheezing and good diaphragmatic motion Heart:  S1/S2 no murmur, no rub, gallop or click PMI normal post sternotomy  Abdomen: benighn, BS positve, no tenderness, no AAA no bruit.  No HSM or HJR Distal pulses intact with no bruits No edema Neuro non-focal Skin warm and dry No muscular weakness    ASSESSMENT & PLAN:    1. Essential hypertension - started on aldactone by PA 11/10/20 improved   2. CAD/CABG. -08/26/20 doing well with preserved EF continue ASA Beta blocker and statin   4. PAF) -Maintaining sinus rhythm.  He is status post Maze procedure and atrial appendage clipping.  Continue eliqus   5. Hyperlipidemia, unspecified hyperlipidemia type -LDL optimal on most recent lab work.  Continue current Rx.    6. Dilated Aorta:  4.3 cm f/u CT August 2022     Dispo:  F/U in 6  months   Medication Adjustments/Labs and Tests Ordered: Current medicines are reviewed at length with the patient today.  Concerns regarding medicines are outlined above.  Tests Ordered: Orders Placed This Encounter  Procedures  . CT ANGIO CHEST AORTA W/CM & OR WO/CM   Medication Changes: No orders of the defined types were placed in this encounter.   Signed, Jenkins Rouge, MD  12/22/2020 9:32 AM    [address], [place], [place]  [address] Phone: [phone]; Fax: [phone]

## 2020-12-22 ENCOUNTER — Other Ambulatory Visit: Payer: Self-pay

## 2020-12-22 ENCOUNTER — Other Ambulatory Visit: Payer: Self-pay | Admitting: Family Medicine

## 2020-12-22 ENCOUNTER — Encounter: Payer: Self-pay | Admitting: Cardiovascular Disease

## 2020-12-22 ENCOUNTER — Ambulatory Visit (INDEPENDENT_AMBULATORY_CARE_PROVIDER_SITE_OTHER): Payer: Medicare Other | Admitting: Cardiovascular Disease

## 2020-12-22 VITALS — BP 116/62 | HR 72 | Ht 71.0 in | Wt 172.0 lb

## 2020-12-22 DIAGNOSIS — I77819 Aortic ectasia, unspecified site: Secondary | ICD-10-CM

## 2020-12-22 NOTE — Patient Instructions (Signed)
Medication Instructions:  *If you need a refill on your cardiac medications before your next appointment, please call your pharmacy*  Lab Work: If you have labs (blood work) drawn today and your tests are completely normal, you will receive your results only by: Marland Kitchen MyChart Message (if you have MyChart) OR . A paper copy in the mail If you have any lab test that is abnormal or we need to change your treatment, we will call you to review the results.  Testing/Procedures: Cardiac CT Angiography (CTA) in August 2022, is a special type of CT scan that uses a computer to produce multi-dimensional views of major blood vessels throughout the body. In CT angiography, a contrast material is injected through an IV to help visualize the blood vessels  Follow-Up: At [facility], you and your health needs are our priority.  As part of our continuing mission to provide you with exceptional heart care, we have created designated Provider Care Teams.  These Care Teams include your primary Cardiologist (physician) and Advanced Practice Providers (APPs -  Physician Assistants and Nurse Practitioners) who all work together to provide you with the care you need, when you need it.  We recommend signing up for the patient portal called "MyChart".  Sign up information is provided on this After Visit Summary.  MyChart is used to connect with patients for Virtual Visits (Telemedicine).  Patients are able to view lab/test results, encounter notes, upcoming appointments, etc.  Non-urgent messages can be sent to your provider as well.   To learn more about what you can do with MyChart, go to [email or web address].    Your next appointment:   6 month(s)  The format for your next appointment:   In Person  Provider:   You may see Jenkins Rouge, MD or one of the following Advanced Practice Providers on your designated Care Team:    Truitt Merle, NP  Cecilie Kicks, NP  Kathyrn Drown, NP

## 2021-01-11 ENCOUNTER — Other Ambulatory Visit: Payer: Self-pay

## 2021-01-11 ENCOUNTER — Encounter: Payer: Self-pay | Admitting: Family Medicine

## 2021-01-11 ENCOUNTER — Ambulatory Visit (INDEPENDENT_AMBULATORY_CARE_PROVIDER_SITE_OTHER): Payer: Medicare Other | Admitting: Family Medicine

## 2021-01-11 VITALS — BP 132/82 | HR 70 | Temp 96.1°F | Ht 71.0 in | Wt 178.2 lb

## 2021-01-11 DIAGNOSIS — R1909 Other intra-abdominal and pelvic swelling, mass and lump: Secondary | ICD-10-CM

## 2021-01-11 NOTE — Progress Notes (Signed)
Subjective:  Patient ID: Joseph Larsen, male    DOB: March 14, 1946  Age: 75 y.o. MRN: [id number]  CC: Hernia?, Groin Swelling, and Groin Pain   HPI Joseph Larsen presents for concern about a soreness and bulging of the right groin area.  He says it is worse when he bends over, coughs or sneezes or exerts himself.  He just noticed it first a couple of days ago.  He is worried about a hernia.  There is no similar finding on the left for him.  Depression screen [facility] 2/9 01/11/2021 10/14/2020 06/18/2020  Decreased Interest 0 0 0  Down, Depressed, Hopeless 0 0 0  PHQ - 2 Score 0 0 0  Altered sleeping - 0 -  Tired, decreased energy - 0 -  Change in appetite - 0 -  Feeling bad or failure about yourself  - 0 -  Trouble concentrating - 0 -  Moving slowly or fidgety/restless - 0 -  Suicidal thoughts - 0 -  PHQ-9 Score - 0 -  Difficult doing work/chores - - -  Some recent data might be hidden    History Joseph Larsen has a past medical history of Abnormal ECG, Allergic rhinitis, Anxiety, Arthritis, Carotid artery disease ([place]), Cataract, Coronary artery disease, Depression, Diverticular disease, Dyspnea on exertion, Echocardiogram, ETOH abuse, GERD (gastroesophageal reflux disease), Gout, Hemorrhoids, Hiatal hernia, HTN (hypertension), Hyperlipidemia (09/10/2020), Low testosterone, MACROCYTIC ANEMIA, NONSPEC ELEVATION OF LEVELS OF TRANSAMINASE/LDH, Nonspecific abnormal finding in stool contents, PAF (paroxysmal atrial fibrillation) ([place]), Palpitations, and PTSD (post-traumatic stress disorder).   He has a past surgical history that includes Knee surgery (Bilateral); Cholecystectomy (2004); Shoulder arthroscopy (Right, 01/14/2020); Cardioversion (N/A, 05/15/2020); Cardioversion (N/A, 06/01/2020); LEFT HEART CATH AND CORONARY ANGIOGRAPHY (N/A, 08/07/2020); Coronary artery bypass graft (N/A, 08/26/2020); MAZE (N/A, 08/26/2020); TEE without cardioversion (N/A, 08/26/2020); and Clipping of atrial appendage (N/A, 08/26/2020).    His family history includes Heart disease in his father and mother; Heart murmur in his sister; Hypertension in his brother, sister, sister, sister, and sister; Liver disease in his brother.He reports that he quit smoking about 38 years ago. His smoking use included cigarettes. He started smoking about 55 years ago. He smoked 3.00 packs per day. He has never used smokeless tobacco. He reports previous alcohol use of about 14.0 standard drinks of alcohol per week. He reports that he does not use drugs.    ROS Review of Systems  Objective:  BP 132/82   Pulse 70   Temp (!) 96.1 F (35.6 C) (Temporal)   Ht 5' 11"  (1.803 m)   Wt 178 lb 3.2 oz (80.8 kg)   BMI 24.85 kg/m   BP Readings from Last 3 Encounters:  01/11/21 132/82  12/22/20 116/62  11/10/20 130/80    Wt Readings from Last 3 Encounters:  01/11/21 178 lb 3.2 oz (80.8 kg)  12/22/20 172 lb (78 kg)  11/10/20 179 lb (81.2 kg)     Physical Exam Vitals reviewed.  Constitutional:      Appearance: Normal appearance. He is well-developed and well-nourished.  HENT:     Head: Normocephalic and atraumatic.     Right Ear: External ear normal.     Left Ear: External ear normal.     Mouth/Throat:     Pharynx: No oropharyngeal exudate or posterior oropharyngeal erythema.  Cardiovascular:     Rate and Rhythm: Normal rate and regular rhythm.  Pulmonary:     Effort: No respiratory distress.     Breath sounds: Normal breath sounds.  Abdominal:     Hernia: There is no hernia in the left inguinal area or right inguinal area (Although the inguinal ring was easily palpable).  Neurological:     Mental Status: He is alert and oriented to person, place, and time.       Assessment & Plan:   Joseph Larsen was seen today for hernia?, groin swelling and groin pain.  Diagnoses and all orders for this visit:  Mass of right inguinal region -     US PELVIS LIMITED (TRANSABDOMINAL ONLY); Future       I am having Joseph Larsen maintain  his ALPRAZolam, QUEtiapine, omeprazole, Cyanocobalamin, Vitamin D, fluticasone, acetaminophen, aspirin, atorvastatin, apixaban, losartan, isosorbide mononitrate, atenolol, spironolactone, and tamsulosin.  Allergies as of 01/11/2021      Reactions   Terazosin Rash   Rash when in sun   Enalapril Maleate Rash   Lisinopril Rash, Itching   Penicillins Itching, Rash   35 years ago   Adhesive [tape] Rash   bandaids       Medication List       Accurate as of January 11, 2021  2:45 PM. If you have any questions, ask your nurse or doctor.        acetaminophen 500 MG tablet Commonly known as: TYLENOL Take 1,000 mg by mouth daily as needed for moderate pain or headache.   ALPRAZolam 0.5 MG tablet Commonly known as: XANAX Take 0.25 mg by mouth at bedtime.   apixaban 5 MG Tabs tablet Commonly known as: ELIQUIS Take 1 tablet (5 mg total) by mouth 2 (two) times daily.   aspirin 81 MG EC tablet Take 1 tablet (81 mg total) by mouth daily. Swallow whole.   atenolol 25 MG tablet Commonly known as: TENORMIN Take 25 mg by mouth daily.   atorvastatin 40 MG tablet Commonly known as: Lipitor Take 1 tablet (40 mg total) by mouth daily.   Cyanocobalamin 1000 MCG/ML Kit Inject 1,000 mcg as directed every 30 (thirty) days.   fluticasone 50 MCG/ACT nasal spray Commonly known as: FLONASE Place 2 sprays into both nostrils 2 (two) times daily as needed for allergies or rhinitis.   isosorbide mononitrate 30 MG 24 hr tablet Commonly known as: IMDUR Take 1 tablet (30 mg total) by mouth daily.   losartan 100 MG tablet Commonly known as: COZAAR Take 1 tablet (100 mg total) by mouth daily.   omeprazole 20 MG capsule Commonly known as: PRILOSEC Take 20 mg by mouth 2 (two) times daily as needed (for heartburn / acid reflux).   QUEtiapine 100 MG tablet Commonly known as: SEROQUEL Take 25 mg by mouth at bedtime. Pt only takes 1/4 tab   spironolactone 25 MG tablet Commonly known as:  ALDACTONE Take 1 tablet (25 mg total) by mouth daily.   tamsulosin 0.4 MG Caps capsule Commonly known as: FLOMAX TAKE 1 CAPSULE BY MOUTH ONCE DAILY AFTER BREAKFAST   Vitamin D 50 MCG (2000 UT) tablet Take 2,000 Units by mouth daily.        Follow-up: No follow-ups on file.  Claretta Fraise, M.D.

## 2021-01-18 ENCOUNTER — Ambulatory Visit (HOSPITAL_COMMUNITY)
Admission: RE | Admit: 2021-01-18 | Discharge: 2021-01-18 | Disposition: A | Discharge status: Home / Self Care | Payer: Medicare Other | Source: Ambulatory Visit | Attending: Family Medicine | Admitting: Family Medicine

## 2021-01-18 ENCOUNTER — Other Ambulatory Visit: Payer: Self-pay

## 2021-01-18 DIAGNOSIS — R1909 Other intra-abdominal and pelvic swelling, mass and lump: Secondary | ICD-10-CM | POA: Insufficient documentation

## 2021-01-19 ENCOUNTER — Other Ambulatory Visit: Payer: Self-pay | Admitting: Family Medicine

## 2021-01-19 DIAGNOSIS — K4090 Unilateral inguinal hernia, without obstruction or gangrene, not specified as recurrent: Secondary | ICD-10-CM

## 2021-02-21 IMAGING — DX DG CHEST 2V
2 series · 2 of 2 positions shown · non-contrast
Comparison: 08/28/2020 chest radiograph.

CLINICAL DATA: Status post 4 vessel CABG

EXAM:
CHEST - 2 VIEW

[dg chest 2 view (1 of 2)]
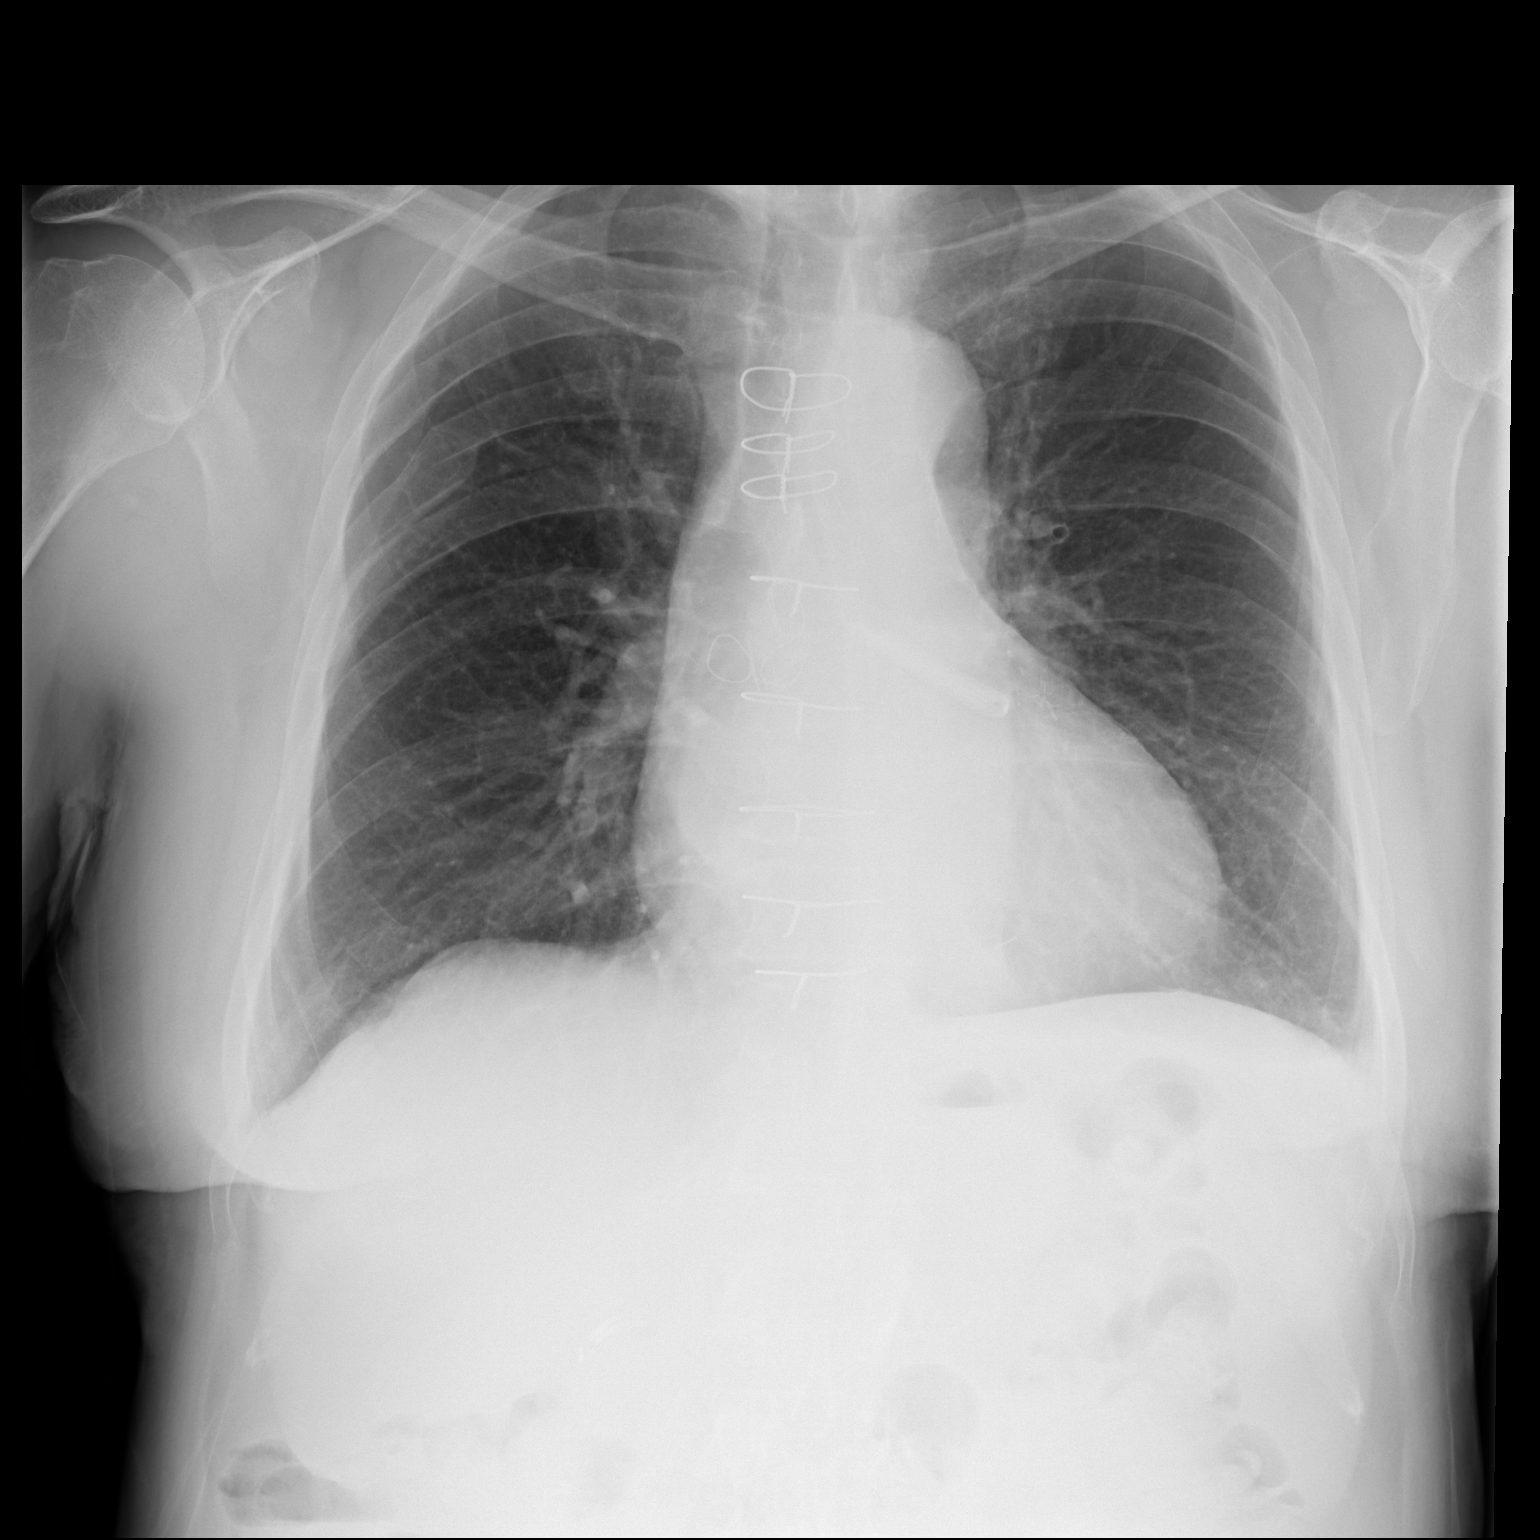

[dg chest 2 view (2 of 2)]
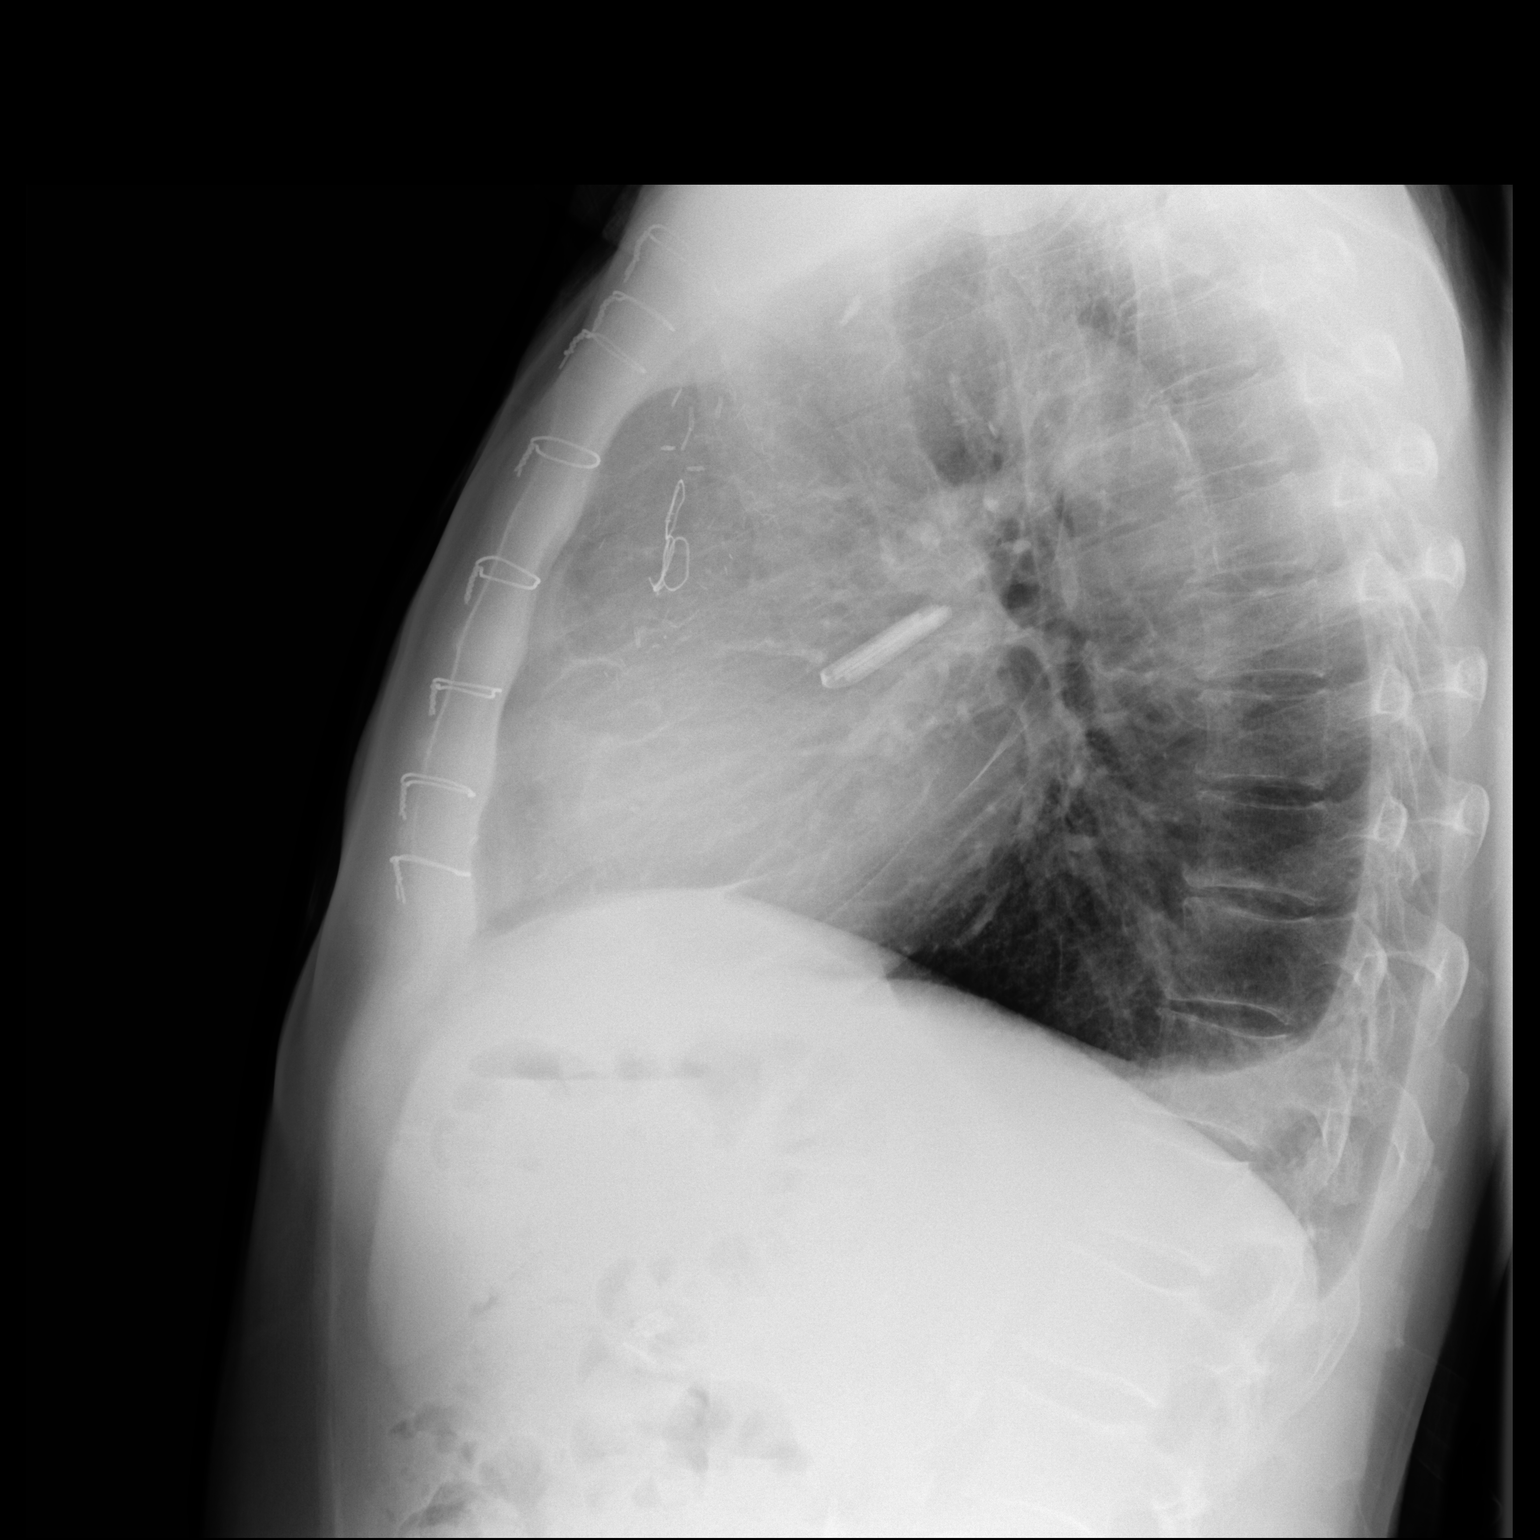

[2 of 2 positions shown; findings below may reference images not displayed]

FINDINGS: Intact sternotomy wires. CABG clips overlie the mediastinum. Stable
cardiomediastinal silhouette with top-normal heart size. No
pneumothorax. No right pleural effusion. Small left pleural
effusion, slightly increased. No pulmonary edema. No acute
consolidative airspace disease.
IMPRESSION: Small left pleural effusion, slightly increased. Otherwise no active
disease.

## 2021-02-24 ENCOUNTER — Telehealth: Payer: Self-pay | Admitting: *Deleted

## 2021-02-24 ENCOUNTER — Ambulatory Visit: Payer: Self-pay | Admitting: Surgery

## 2021-02-24 DIAGNOSIS — K4090 Unilateral inguinal hernia, without obstruction or gangrene, not specified as recurrent: Secondary | ICD-10-CM | POA: Diagnosis not present

## 2021-02-24 NOTE — Telephone Encounter (Signed)
ADDENDUM: CLEARANCE REQUEST ASKING TO HOLD ELIQUIS x 3 DAYS PRIOR AND RESUME THE DAY AFTER PROCEDURE.

## 2021-02-24 NOTE — Telephone Encounter (Signed)
   [facility] Pre-operative Risk Assessment    HEARTCARE STAFF: - Please ensure there is not already an duplicate clearance open for this procedure. - Under Visit Info/Reason for Call, type in Other and utilize the format Clearance MM/DD/YY or Clearance TBD. Do not use dashes or single digits. - If request is for dental extraction, please clarify the # of teeth to be extracted.  Request for surgical clearance:  1. What type of surgery is being performed? HERNIA REPAIR   2. When is this surgery scheduled? TBD   3. What type of clearance is required (medical clearance vs. Pharmacy clearance to hold med vs. Both)? BOTH  4. Are there any medications that need to be held prior to surgery and how long? ELIQUIS   5. Practice name and name of physician performing surgery? [facility]; DR. Sherren Mocha GERKIN   6. What is the office phone number? [phone]   7.   What is the office fax number? [place]: Mammie Lorenzo, LPN  8.   Anesthesia type (None, local, MAC, general) ? GENERAL   Julaine Hua 02/24/2021, 12:53 PM  _________________________________________________________________   (provider comments below)

## 2021-02-25 NOTE — Telephone Encounter (Signed)
   Primary Cardiologist: Jenkins Rouge, MD  Chart reviewed as part of pre-operative protocol coverage. Patient was contacted 02/25/2021 in reference to pre-operative risk assessment for pending surgery as outlined below.  Joseph Larsen was last seen on 12/22/20 by Dr. Johnsie Cancel.  Since that day, NAFTALI CARCHI has done well from a cardiac standpoint. He stays busy walking and doing yard work. He can easily complete 4 METs without anginal complaints.  Therefore, based on ACC/AHA guidelines, the patient would be at acceptable risk for the planned procedure without further cardiovascular testing.   The patient was advised that if he develops new symptoms prior to surgery to contact our office to arrange for a follow-up visit, and he verbalized understanding.  Per pharmacy recommendations, patient can hold eliquis 2 days prior to his upcoming surgery with plans to restart as soon as he is cleared to do so by his surgeon.  I will route this recommendation to the requesting party via Epic fax function and remove from pre-op pool. Please call with questions.  Abigail Butts, PA-C 02/25/2021, 1:17 PM

## 2021-02-25 NOTE — Telephone Encounter (Signed)
Patient with diagnosis of afib on Eliquis for anticoagulation.    Procedure: HERNIA REPAIR  Date of procedure: TBD  CHA2DS2-VASc Score = 3  This indicates a 3.2% annual risk of stroke. The patient's score is based upon: CHF History: No HTN History: Yes Diabetes History: No Stroke History: No Vascular Disease History: Yes Age Score: 1 Gender Score: 0     CrCl 85.4 ml/min  Per office protocol, patient can hold Eliquis for 2 days prior to procedure.   Joseph Larsen

## 2021-03-03 ENCOUNTER — Other Ambulatory Visit (HOSPITAL_COMMUNITY): Payer: Medicare Other

## 2021-03-23 ENCOUNTER — Encounter (HOSPITAL_BASED_OUTPATIENT_CLINIC_OR_DEPARTMENT_OTHER): Payer: Self-pay | Admitting: Surgery

## 2021-03-23 ENCOUNTER — Other Ambulatory Visit: Payer: Self-pay

## 2021-03-23 NOTE — Progress Notes (Signed)
Spoke w/ via phone for pre-op interview--- PT Lab needs dos---- Istat               Lab results------ current ekg in epic/ chart COVID test ------ 03-25-2021 @ 1105  Arrive at ------- 0930 on 03-29-2021 NPO after MN NO Solid Food.  Clear liquids from MN until--- 0830 Med rec completed Medications to take morning of surgery ----- Atenolol, Prilosec Diabetic medication ----- n/a  Patient instructed to bring photo id and insurance card day of surgery Patient aware to have Driver (ride ) / caregiver    for 24 hours after surgery -- wife, Suanne Marker Patient Special Instructions ----- n/a  Pre-Op special Istructions ----- pt has telephone cardiac clearance by Roby Lofts PA on 02-25-2021 in epic/ chart  Patient verbalized understanding of instructions that were given at this phone interview. Patient denies shortness of breath, chest pain, fever, cough at this phone interview.   Anesthesia :  HTN;  CAD s/p CABG x4 and MAZE procedure 08-26-2020;;  Pt denies any cardiac s&s, sob, and no peripheral swelling.  PCP:  Dr A. Gottschalk Cardiologist : Dr Johnsie Cancel (lov 12-22-2020 epic) Chest x-ray :  09-24-2020 epic EKG : 11-10-2020 epic Echo : 12-07-2020 epic Stress test:  05-29-2020 epic Cardiac Cath :  08-07-2020 epic Activity level:  Denies sob w/ any activity Sleep Study/ CPAP :  NO Fasting Blood Sugar :      / Checks Blood Sugar -- times a day:   n/a Blood Thinner/ Instructions Maryjane Hurter Dose: Eliquis ASA / Instructions/ Last Dose : ASA 81mg  Per pt was given instructions by cardiology and dr gerkin office to stop both 2 days prior to surgery

## 2021-03-25 ENCOUNTER — Other Ambulatory Visit (HOSPITAL_COMMUNITY)
Admission: RE | Admit: 2021-03-25 | Discharge: 2021-03-25 | Disposition: A | Discharge status: Home / Self Care | Payer: Medicare Other | Source: Ambulatory Visit | Attending: Surgery | Admitting: Surgery

## 2021-03-25 DIAGNOSIS — Z01812 Encounter for preprocedural laboratory examination: Secondary | ICD-10-CM | POA: Insufficient documentation

## 2021-03-25 DIAGNOSIS — Z20822 Contact with and (suspected) exposure to covid-19: Secondary | ICD-10-CM | POA: Diagnosis not present

## 2021-03-25 LAB — SARS CORONAVIRUS 2 (TAT 6-24 HRS): SARS Coronavirus 2: NEGATIVE

## 2021-03-27 ENCOUNTER — Encounter (HOSPITAL_BASED_OUTPATIENT_CLINIC_OR_DEPARTMENT_OTHER): Payer: Self-pay | Admitting: Surgery

## 2021-03-27 NOTE — H&P (Signed)
General Surgery [facility]  Joseph Larsen DOB: 04-03-1946 Married / Language: English / Race: White Male   History of Present Illness  The patient is a 75 year old male who presents with an inguinal hernia.  CHIEF COMPLAINT: right inguinal hernia  Patient is referred by Dr. Claretta Fraise for surgical evaluation and management of newly diagnosed right inguinal hernia. Patient's cardiologist is Dr. Jenkins Rouge. Patient first noted a bulge in the right groin in February 2022. He had previously had some discomfort in this area. Patient was evaluated by his primary care physician and underwent an ultrasound examination of the groin on January 18, 2021 which documented a right inguinal hernia containing bowel. Hernia has remained reducible. Patient has had no signs or symptoms of obstruction. His only previous abdominal surgery was laparoscopic cholecystectomy many years ago. Patient is retired. He presents today accompanied by his wife. Patient does have a history of coronary artery disease. He underwent bypass surgery in 2021. He currently takes Eliquis.   Past Surgical History  Coronary Artery Bypass Graft  Gallbladder Surgery - Laparoscopic  Knee Surgery  Bilateral. Shoulder Surgery  Right.  Diagnostic Studies History  Colonoscopy  1-5 years ago  Allergies  Enalapril Mal-Diltiazem Malate *ANTIHYPERTENSIVES*  Lisinopril & Diet Manage Prod *ANTIHYPERTENSIVES*  Penicillin G Pot in Dextrose *PENICILLINS*  Adhesive 1"x6yd *MEDICAL DEVICES AND SUPPLIES*  Allergies Reconciled   Medication History  Atenolol (25MG  Tablet, Oral) Active. Atenolol (50MG  Tablet, Oral) Active. Atorvastatin Calcium (40MG  Tablet, Oral) Active. Flecainide Acetate (50MG  Tablet, Oral) Active. Isosorbide Mononitrate ER (30MG  Tablet ER 24HR, Oral) Active. Losartan Potassium (100MG  Tablet, Oral) Active. Losartan Potassium (25MG  Tablet, Oral) Active. Nitroglycerin  (0.4MG  Tab Sublingual, Sublingual) Active. Potassium Chloride Crys ER (20MEQ Tablet ER, Oral) Active. Spironolactone (25MG  Tablet, Oral) Active. Tamsulosin HCl (0.4MG  Capsule, Oral) Active. Medications Reconciled  Social History  Alcohol use  Occasional alcohol use. No caffeine use  No drug use  Tobacco use  Former smoker.  Family History  Hypertension  Sister.  Other Problems  Anxiety Disorder  Arthritis  Atrial Fibrillation  Depression  Enlarged Prostate  Gastroesophageal Reflux Disease  High blood pressure   Review of Systems General Not Present- Appetite Loss, Chills, Fatigue, Fever, Night Sweats, Weight Gain and Weight Loss. Skin Not Present- Change in Wart/Mole, Dryness, Hives, Jaundice, New Lesions, Non-Healing Wounds, Rash and Ulcer. HEENT Not Present- Earache, Hearing Loss, Hoarseness, Nose Bleed, Oral Ulcers, Ringing in the Ears, Seasonal Allergies, Sinus Pain, Sore Throat, Visual Disturbances, Wears glasses/contact lenses and Yellow Eyes. Respiratory Not Present- Bloody sputum, Chronic Cough, Difficulty Breathing, Snoring and Wheezing. Breast Not Present- Breast Mass, Breast Pain, Nipple Discharge and Skin Changes. Gastrointestinal Not Present- Abdominal Pain, Bloating, Bloody Stool, Change in Bowel Habits, Chronic diarrhea, Constipation, Difficulty Swallowing, Excessive gas, Gets full quickly at meals, Hemorrhoids, Indigestion, Nausea, Rectal Pain and Vomiting.  Vitals Weight: 182.13 lb Height: 71in Body Surface Area: 2.03 m Body Mass Index: 25.4 kg/m  Temp.: 97.27F  Pulse: 81 (Regular)  P.OX: 97% (Room air) BP: 130/80(Sitting, Left Arm, Standard)  Physical Exam   GENERAL APPEARANCE Development: normal Nutritional status: normal Gross deformities: none  SKIN Rash, lesions, ulcers: none Induration, erythema: none Nodules: none palpable  EYES Conjunctiva and lids: normal Pupils: equal and reactive Iris: normal  bilaterally  EARS, NOSE, MOUTH, THROAT External ears: no lesion or deformity External nose: no lesion or deformity Hearing: grossly normal Due to Covid-19 pandemic, patient is wearing a mask.  NECK Symmetric: yes Trachea:  midline Thyroid: no palpable nodules in the thyroid bed  CHEST Respiratory effort: normal Retraction or accessory muscle use: no Breath sounds: normal bilaterally Rales, rhonchi, wheeze: none  CARDIOVASCULAR Auscultation: regular rhythm, normal rate Murmurs: none Pulses: radial pulse 2+ palpable Lower extremity edema: none  ABDOMEN Distension: none Masses: none palpable Tenderness: none Hepatosplenomegaly: not present Hernia: not present  GENITOURINARY Penis: no lesions Scrotum: no masses There is a visible bulge in the right groin. On palpation there is a small moderate sized direct inguinal hernia which is reducible. It is minimally tender. Palpation in the left inguinal canal with cough and Valsalva shows no sign of hernia.  MUSCULOSKELETAL Station and gait: normal Digits and nails: no clubbing or cyanosis Muscle strength: grossly normal all extremities Range of motion: grossly normal all extremities Deformity: none  LYMPHATIC Cervical: none palpable Supraclavicular: none palpable  PSYCHIATRIC Oriented to person, place, and time: yes Mood and affect: normal for situation Judgment and insight: appropriate for situation    Assessment & Plan   INGUINAL HERNIA OF RIGHT SIDE WITHOUT OBSTRUCTION OR GANGRENE (K40.90)  Patient is referred by his primary care physician for surgical evaluation and management of right inguinal hernia. Patient is provided with written literature on hernia surgery to review at home.  Patient has a reducible right inguinal hernia which is small but moderate in size. I have recommended open repair with mesh as the procedure of choice with the lowest risk of recurrence. We discussed the use of prosthetic mesh. We  discussed the procedure as an outpatient surgical procedure. We discussed restrictions on his activities afterwards. Patient understands and wishes to proceed with surgery in the near future.  We will obtain cardiac clearance from his cardiologist. He will need to hold his Eliquist for 3 days prior to his surgical procedure. He may restart his anticoagulation the day after surgery.  The risks and benefits of the procedure have been discussed at length with the patient. The patient understands the proposed procedure, potential alternative treatments, and the course of recovery to be expected. All of the patient's questions have been answered at this time. The patient wishes to proceed with surgery.  Armandina Gemma, MD [facility] Office: [phone]

## 2021-03-29 ENCOUNTER — Ambulatory Visit (HOSPITAL_BASED_OUTPATIENT_CLINIC_OR_DEPARTMENT_OTHER): Payer: Medicare Other | Admitting: Anesthesiology

## 2021-03-29 ENCOUNTER — Encounter (HOSPITAL_BASED_OUTPATIENT_CLINIC_OR_DEPARTMENT_OTHER): Payer: Self-pay | Admitting: Surgery

## 2021-03-29 ENCOUNTER — Encounter (HOSPITAL_BASED_OUTPATIENT_CLINIC_OR_DEPARTMENT_OTHER)
Admission: RE | Disposition: A | Discharge status: Home / Self Care | Payer: Self-pay | Source: Ambulatory Visit | Attending: Surgery

## 2021-03-29 ENCOUNTER — Other Ambulatory Visit: Payer: Self-pay

## 2021-03-29 ENCOUNTER — Ambulatory Visit (HOSPITAL_BASED_OUTPATIENT_CLINIC_OR_DEPARTMENT_OTHER)
Admission: RE | Admit: 2021-03-29 | Discharge: 2021-03-29 | Disposition: A | Discharge status: Home / Self Care | Payer: Medicare Other | Source: Ambulatory Visit | Attending: Surgery | Admitting: Surgery

## 2021-03-29 DIAGNOSIS — F418 Other specified anxiety disorders: Secondary | ICD-10-CM | POA: Diagnosis not present

## 2021-03-29 DIAGNOSIS — I4891 Unspecified atrial fibrillation: Secondary | ICD-10-CM | POA: Diagnosis not present

## 2021-03-29 DIAGNOSIS — K219 Gastro-esophageal reflux disease without esophagitis: Secondary | ICD-10-CM | POA: Insufficient documentation

## 2021-03-29 DIAGNOSIS — K4090 Unilateral inguinal hernia, without obstruction or gangrene, not specified as recurrent: Secondary | ICD-10-CM

## 2021-03-29 DIAGNOSIS — Z888 Allergy status to other drugs, medicaments and biological substances status: Secondary | ICD-10-CM | POA: Insufficient documentation

## 2021-03-29 DIAGNOSIS — Z8249 Family history of ischemic heart disease and other diseases of the circulatory system: Secondary | ICD-10-CM | POA: Insufficient documentation

## 2021-03-29 DIAGNOSIS — I2510 Atherosclerotic heart disease of native coronary artery without angina pectoris: Secondary | ICD-10-CM | POA: Diagnosis not present

## 2021-03-29 DIAGNOSIS — Z91048 Other nonmedicinal substance allergy status: Secondary | ICD-10-CM | POA: Insufficient documentation

## 2021-03-29 DIAGNOSIS — Z7901 Long term (current) use of anticoagulants: Secondary | ICD-10-CM | POA: Insufficient documentation

## 2021-03-29 DIAGNOSIS — Z951 Presence of aortocoronary bypass graft: Secondary | ICD-10-CM | POA: Diagnosis not present

## 2021-03-29 DIAGNOSIS — E876 Hypokalemia: Secondary | ICD-10-CM | POA: Diagnosis not present

## 2021-03-29 DIAGNOSIS — Z87891 Personal history of nicotine dependence: Secondary | ICD-10-CM | POA: Insufficient documentation

## 2021-03-29 DIAGNOSIS — Z880 Allergy status to penicillin: Secondary | ICD-10-CM | POA: Insufficient documentation

## 2021-03-29 DIAGNOSIS — I480 Paroxysmal atrial fibrillation: Secondary | ICD-10-CM | POA: Diagnosis not present

## 2021-03-29 HISTORY — DX: Atrioventricular block, first degree: I44.0

## 2021-03-29 HISTORY — PX: INGUINAL HERNIA REPAIR: SHX194

## 2021-03-29 HISTORY — DX: Personal history of COVID-19: Z86.16

## 2021-03-29 HISTORY — DX: Benign prostatic hyperplasia without lower urinary tract symptoms: N40.0

## 2021-03-29 HISTORY — DX: Unilateral inguinal hernia, without obstruction or gangrene, not specified as recurrent: K40.90

## 2021-03-29 LAB — POCT I-STAT, CHEM 8
BUN: 12 mg/dL (ref 8–23)
Calcium, Ion: 1.29 mmol/L (ref 1.15–1.40)
Chloride: 99 mmol/L (ref 98–111)
Creatinine, Ser: 0.7 mg/dL (ref 0.61–1.24)
Glucose, Bld: 84 mg/dL (ref 70–99)
HCT: 44 % (ref 39.0–52.0)
Hemoglobin: 15 g/dL (ref 13.0–17.0)
Potassium: 4 mmol/L (ref 3.5–5.1)
Sodium: 143 mmol/L (ref 135–145)
TCO2: 30 mmol/L (ref 22–32)

## 2021-03-29 SURGERY — REPAIR, HERNIA, INGUINAL, ADULT
Anesthesia: General | Laterality: Right

## 2021-03-29 MED ORDER — BUPIVACAINE LIPOSOME 1.3 % IJ SUSP
INTRAMUSCULAR | Status: DC | PRN
  Administered 2021-03-29: 10 mL

## 2021-03-29 MED ORDER — MIDAZOLAM HCL 2 MG/2ML IJ SOLN
INTRAMUSCULAR | Status: AC
  Filled 2021-03-29: qty 2

## 2021-03-29 MED ORDER — LIDOCAINE 2% (20 MG/ML) 5 ML SYRINGE
INTRAMUSCULAR | Status: DC | PRN
  Administered 2021-03-29: 60 mg via INTRAVENOUS

## 2021-03-29 MED ORDER — OXYCODONE HCL 5 MG PO TABS: 5.0000 mg | ORAL_TABLET | Freq: Four times a day (QID) | ORAL | 0 refills | Status: DC | PRN

## 2021-03-29 MED ORDER — EPHEDRINE 5 MG/ML INJ
INTRAVENOUS | Status: AC
  Filled 2021-03-29: qty 10

## 2021-03-29 MED ORDER — ONDANSETRON HCL 4 MG/2ML IJ SOLN
INTRAMUSCULAR | Status: AC
  Filled 2021-03-29: qty 2

## 2021-03-29 MED ORDER — PROPOFOL 10 MG/ML IV BOLUS
INTRAVENOUS | Status: AC
  Filled 2021-03-29: qty 20

## 2021-03-29 MED ORDER — FENTANYL CITRATE (PF) 100 MCG/2ML IJ SOLN: 25.0000 ug | INTRAMUSCULAR | Status: DC | PRN

## 2021-03-29 MED ORDER — EPHEDRINE SULFATE-NACL 50-0.9 MG/10ML-% IV SOSY
PREFILLED_SYRINGE | INTRAVENOUS | Status: DC | PRN
  Administered 2021-03-29: 5 mg via INTRAVENOUS
  Administered 2021-03-29: 10 mg via INTRAVENOUS

## 2021-03-29 MED ORDER — ONDANSETRON HCL 4 MG/2ML IJ SOLN: 4.0000 mg | Freq: Once | INTRAMUSCULAR | Status: DC | PRN

## 2021-03-29 MED ORDER — BUPIVACAINE HCL 0.5 % IJ SOLN
INTRAMUSCULAR | Status: DC | PRN
  Administered 2021-03-29: 30 mL

## 2021-03-29 MED ORDER — FENTANYL CITRATE (PF) 100 MCG/2ML IJ SOLN
INTRAMUSCULAR | Status: AC
  Filled 2021-03-29: qty 2

## 2021-03-29 MED ORDER — DEXAMETHASONE SODIUM PHOSPHATE 10 MG/ML IJ SOLN
INTRAMUSCULAR | Status: AC
  Filled 2021-03-29: qty 1

## 2021-03-29 MED ORDER — BUPIVACAINE LIPOSOME 1.3 % IJ SUSP: 20.0000 mL | Freq: Once | INTRAMUSCULAR | Status: DC

## 2021-03-29 MED ORDER — CHLORHEXIDINE GLUCONATE CLOTH 2 % EX PADS: 6.0000 | MEDICATED_PAD | Freq: Once | CUTANEOUS | Status: DC

## 2021-03-29 MED ORDER — FENTANYL CITRATE (PF) 100 MCG/2ML IJ SOLN
INTRAMUSCULAR | Status: DC | PRN
  Administered 2021-03-29 (×2): 50 ug via INTRAVENOUS

## 2021-03-29 MED ORDER — LIDOCAINE 2% (20 MG/ML) 5 ML SYRINGE
INTRAMUSCULAR | Status: AC
  Filled 2021-03-29: qty 5

## 2021-03-29 MED ORDER — ACETAMINOPHEN 10 MG/ML IV SOLN: 1000.0000 mg | Freq: Once | INTRAVENOUS | Status: DC | PRN

## 2021-03-29 MED ORDER — OXYCODONE HCL 5 MG/5ML PO SOLN: 5.0000 mg | Freq: Once | ORAL | Status: DC | PRN

## 2021-03-29 MED ORDER — PROPOFOL 10 MG/ML IV BOLUS
INTRAVENOUS | Status: DC | PRN
  Administered 2021-03-29: 130 mg via INTRAVENOUS

## 2021-03-29 MED ORDER — MIDAZOLAM HCL 5 MG/5ML IJ SOLN
INTRAMUSCULAR | Status: DC | PRN
  Administered 2021-03-29: 1 mg via INTRAVENOUS

## 2021-03-29 MED ORDER — CIPROFLOXACIN IN D5W 400 MG/200ML IV SOLN
INTRAVENOUS | Status: AC
  Filled 2021-03-29: qty 200

## 2021-03-29 MED ORDER — LACTATED RINGERS IV SOLN: INTRAVENOUS | Status: DC

## 2021-03-29 MED ORDER — CIPROFLOXACIN IN D5W 400 MG/200ML IV SOLN
400.0000 mg | INTRAVENOUS | Status: AC
  Administered 2021-03-29: 400 mg via INTRAVENOUS

## 2021-03-29 MED ORDER — DEXAMETHASONE SODIUM PHOSPHATE 10 MG/ML IJ SOLN
INTRAMUSCULAR | Status: DC | PRN
  Administered 2021-03-29: 5 mg via INTRAVENOUS

## 2021-03-29 MED ORDER — OXYCODONE HCL 5 MG PO TABS: 5.0000 mg | ORAL_TABLET | Freq: Once | ORAL | Status: DC | PRN

## 2021-03-29 SURGICAL SUPPLY — 44 items
ADH SKN CLS APL DERMABOND .7 (GAUZE/BANDAGES/DRESSINGS) ×1
APL PRP STRL LF DISP 70% ISPRP (MISCELLANEOUS) ×1
BLADE CLIPPER SENSICLIP SURGIC (BLADE) ×2 IMPLANT
BLADE HEX COATED 2.75 (ELECTRODE) ×2 IMPLANT
BLADE SURG 15 STRL LF DISP TIS (BLADE) ×1 IMPLANT
BLADE SURG 15 STRL SS (BLADE) ×2
CANISTER SUCT 1200ML W/VALVE (MISCELLANEOUS) IMPLANT
CHLORAPREP W/TINT 26 (MISCELLANEOUS) ×2 IMPLANT
COVER BACK TABLE 60X90IN (DRAPES) ×2 IMPLANT
COVER MAYO STAND STRL (DRAPES) ×2 IMPLANT
COVER WAND RF STERILE (DRAPES) ×2 IMPLANT
DECANTER SPIKE VIAL GLASS SM (MISCELLANEOUS) ×2 IMPLANT
DERMABOND ADVANCED (GAUZE/BANDAGES/DRESSINGS) ×1
DERMABOND ADVANCED .7 DNX12 (GAUZE/BANDAGES/DRESSINGS) ×1 IMPLANT
DRAIN PENROSE 0.5X18 (DRAIN) IMPLANT
DRAPE LAPAROTOMY TRNSV 102X78 (DRAPES) ×2 IMPLANT
DRAPE UTILITY XL STRL (DRAPES) ×2 IMPLANT
ELECT REM PT RETURN 9FT ADLT (ELECTROSURGICAL) ×2
ELECTRODE REM PT RTRN 9FT ADLT (ELECTROSURGICAL) ×1 IMPLANT
GLOVE SURG ORTHO LTX SZ8 (GLOVE) ×2 IMPLANT
GOWN STRL REUS W/TWL XL LVL3 (GOWN DISPOSABLE) ×2 IMPLANT
KIT TURNOVER CYSTO (KITS) ×2 IMPLANT
MANIFOLD NEPTUNE II (INSTRUMENTS) IMPLANT
MESH ULTRAPRO 3X6 7.6X15CM (Mesh General) ×2 IMPLANT
NDL HYPO 25X1 1.5 SAFETY (NEEDLE) ×1 IMPLANT
NEEDLE HYPO 25X1 1.5 SAFETY (NEEDLE) ×2 IMPLANT
NS IRRIG 500ML POUR BTL (IV SOLUTION) ×2 IMPLANT
PACK BASIN DAY SURGERY FS (CUSTOM PROCEDURE TRAY) ×2 IMPLANT
PENCIL SMOKE EVACUATOR (MISCELLANEOUS) ×2 IMPLANT
SPONGE LAP 18X18 RF (DISPOSABLE) ×2 IMPLANT
SPONGE LAP 4X18 RFD (DISPOSABLE) ×2 IMPLANT
SUT MNCRL AB 4-0 PS2 18 (SUTURE) ×2 IMPLANT
SUT NOVA 0 T19/GS 22DT (SUTURE) IMPLANT
SUT NOVA NAB DX-16 0-1 5-0 T12 (SUTURE) IMPLANT
SUT NOVA NAB GS-22 2 0 T19 (SUTURE) ×4 IMPLANT
SUT SILK 2 0 TIES 17X18 (SUTURE)
SUT SILK 2-0 18XBRD TIE BLK (SUTURE) IMPLANT
SUT VIC AB 3-0 SH 18 (SUTURE) ×2 IMPLANT
SYR BULB EAR ULCER 3OZ GRN STR (SYRINGE) ×2 IMPLANT
SYR CONTROL 10ML LL (SYRINGE) ×2 IMPLANT
TOWEL OR 17X26 10 PK STRL BLUE (TOWEL DISPOSABLE) ×2 IMPLANT
TUBE CONNECTING 12X1/4 (SUCTIONS) ×2 IMPLANT
WATER STERILE IRR 500ML POUR (IV SOLUTION) IMPLANT
YANKAUER SUCT BULB TIP NO VENT (SUCTIONS) ×2 IMPLANT

## 2021-03-29 NOTE — Anesthesia Preprocedure Evaluation (Signed)
Anesthesia Evaluation  Patient identified by MRN, date of birth, ID band Patient awake    Reviewed: Allergy & Precautions, NPO status , Patient's Chart, lab work & pertinent test results  Airway Mallampati: II  TM Distance: >3 FB Neck ROM: Full    Dental  (+) Teeth Intact   Pulmonary neg pulmonary ROS, former smoker,    Pulmonary exam normal        Cardiovascular hypertension, Pt. on medications and Pt. on home beta blockers + CAD and + CABG (09/21)   Rhythm:Regular Rate:Normal     Neuro/Psych Anxiety Depression negative neurological ROS     GI/Hepatic Neg liver ROS, hiatal hernia, GERD  Medicated,  Endo/Other  negative endocrine ROS  Renal/GU negative Renal ROS  negative genitourinary   Musculoskeletal  (+) Arthritis ,   Abdominal (+)  Abdomen: soft. Bowel sounds: normal.  Peds  Hematology  (+) anemia ,   Anesthesia Other Findings   Reproductive/Obstetrics                            Anesthesia Physical Anesthesia Plan  ASA: III  Anesthesia Plan: General   Post-op Pain Management:    Induction: Intravenous  PONV Risk Score and Plan: 2 and Ondansetron, Dexamethasone and Treatment may vary due to age or medical condition  Airway Management Planned: Mask and LMA  Additional Equipment: None  Intra-op Plan:   Post-operative Plan: Extubation in OR  Informed Consent: I have reviewed the patients History and Physical, chart, labs and discussed the procedure including the risks, benefits and alternatives for the proposed anesthesia with the patient or authorized representative who has indicated his/her understanding and acceptance.     Dental advisory given  Plan Discussed with: CRNA  Anesthesia Plan Comments: (ECHO 12/21: 1. Left ventricular ejection fraction, by estimation, is 60 to 65%. The left ventricle has normal function. The left ventricle has no regional wall motion  abnormalities. 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 Left ventricular diastolic parameters were normal. 2. Right ventricular systolic function is normal. The right ventricular size is normal. There is normal pulmonary artery systolic pressure. The estimated right ventricular systolic pressure is 81.1 mmHg. 3. The mitral valve is normal in structure. Mild mitral valve regurgitation. No evidence of mitral stenosis. 4. The aortic valve is tricuspid. Aortic valve regurgitation is not visualized. Mild aortic valve sclerosis is present, with no evidence of aortic valve stenosis. 5. Aortic dilatation noted. 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 6. The inferior vena cava is normal in size with greater than 50% respiratory variability, suggesting right atrial pressure of 3 mmHg. )        Anesthesia Quick Evaluation

## 2021-03-29 NOTE — Discharge Instructions (Signed)
Post Anesthesia Home Care Instructions  Activity: Get plenty of rest for the remainder of the day. A responsible individual must stay with you for 24 hours following the procedure.  For the next 24 hours, DO NOT: -Drive a car -Paediatric nurse -Drink alcoholic beverages -Take any medication unless instructed by your physician -Make any legal decisions or sign important papers.  Meals: Start with liquid foods such as gelatin or soup. Progress to regular foods as tolerated. Avoid greasy, spicy, heavy foods. If nausea and/or vomiting occur, drink only clear liquids until the nausea and/or vomiting subsides. Call your physician if vomiting continues.  Special Instructions/Symptoms: Your throat may feel dry or sore from the anesthesia or the breathing tube placed in your throat during surgery. If this causes discomfort, gargle with warm salt water. The discomfort should disappear within 24 hours.  If you had a scopolamine patch placed behind your ear for the management of post- operative nausea and/or vomiting:  1. The medication in the patch is effective for 72 hours, after which it should be removed.  Wrap patch in a tissue and discard in the trash. Wash hands thoroughly with soap and water. 2. You may remove the patch earlier than 72 hours if you experience unpleasant side effects which may include dry mouth, dizziness or visual disturbances. 3. Avoid touching the patch. Wash your hands with soap and water after contact with the patch.    Information for Discharge Teaching: EXPAREL (bupivacaine liposome injectable suspension)   Your surgeon or anesthesiologist gave you EXPAREL(bupivacaine) to help control your pain after surgery.   EXPAREL is a local anesthetic that provides pain relief by numbing the tissue around the surgical site.  EXPAREL is designed to release pain medication over time and can control pain for up to 72 hours.  Depending on how you respond to EXPAREL, you may  require less pain medication during your recovery.  Possible side effects:  Temporary loss of sensation or ability to move in the area where bupivacaine was injected.  Nausea, vomiting, constipation  Rarely, numbness and tingling in your mouth or lips, lightheadedness, or anxiety may occur.  Call your doctor right away if you think you may be experiencing any of these sensations, or if you have other questions regarding possible side effects.  Follow all other discharge instructions given to you by your surgeon or nurse. Eat a healthy diet and drink plenty of water or other fluids.  If you return to the hospital for any reason within 96 hours following the administration of EXPAREL, it is important for health care providers to know that you have received this anesthetic. A teal colored band has been placed on your arm with the date, time and amount of EXPAREL you have received in order to alert and inform your health care providers. Please leave this armband in place for the full 96 hours following administration, and then you may remove the band. May remove teal band Friday, 04/02/21   Inguinal Hernia Repair, Adult, Care After The following information offers guidance on how to care for yourself after your procedure. Your health care provider may also give you more specific instructions. If you have problems or questions, contact your health care provider. What can I expect after the procedure? After the procedure, it is common to have:  Pain.  Swelling and bruising around the incision area.  Scrotal swelling, in males.  Some fluid or blood draining from your incisions. Follow these instructions at home: Medicines  Take over-the-counter  and prescription medicines only as told by your health care provider.  Ask your health care provider if the medicine prescribed to you: ? Requires you to avoid driving or using machinery. ? Can cause constipation. You may need to take these  actions to prevent or treat constipation:  Drink enough fluid to keep your urine pale yellow.  Take over-the-counter or prescription medicines.  Eat foods that are high in fiber, such as beans, whole grains, and fresh fruits and vegetables.  Limit foods that are high in fat and processed sugars, such as fried or sweet foods. Incision care  Follow instructions from your health care provider about how to take care of your incisions. Make sure you: ? Wash your hands with soap and water for at least 20 seconds before and after you change your bandage (dressing). If soap and water are not available, use hand sanitizer. ? Change your dressing as told by your health care provider. ? Leave stitches (sutures), skin glue, or adhesive strips in place. These skin closures may need to stay in place for 2 weeks or longer. If adhesive strip edges start to loosen and curl up, you may trim the loose edges. Do not remove adhesive strips completely unless your health care provider tells you to do that.  Check your incision area every day for signs of infection. Check for: ? More redness, swelling, or pain. ? More fluid or blood. ? Warmth. ? Pus or a bad smell.  Wear loose, soft clothing while your incisions heal.   Managing pain and swelling If directed, put ice on the painful or swollen areas. To do this:  Put ice in a plastic bag.  Place a towel between your skin and the bag.  Leave the ice on for 20 minutes, 2-3 times a day.  Remove the ice if your skin turns bright red. This is very important. If you cannot feel pain, heat, or cold, you have a greater risk of damage to the area.   Activity  Do not lift anything that is heavier than 10 lb (4.5 kg), or the limit that you are told, until your health care provider says that it is safe.  Ask your health care provider what activities are safe for you. A lot of activity during the first week after surgery can increase pain and swelling. For 1 week  after your procedure: ? Avoid activities that take a lot of effort, such as exercise or sports. ? You may walk and climb stairs as needed for daily activity, but avoid long walks or climbing stairs for exercise. General instructions  If you were given a sedative during the procedure, it can affect you for several hours. Do not drive or operate machinery until your health care provider says that it is safe.  Do not take baths, swim, or use a hot tub until your health care provider approves. Ask your health care provider if you may take showers. You may only be allowed to take sponge baths.  Do not use any products that contain nicotine or tobacco. These products include cigarettes, chewing tobacco, and vaping devices, such as e-cigarettes. If you need help quitting, ask your health care provider.  Keep all follow-up visits. This is important. Contact a health care provider if:  You have any of these signs of infection: ? More redness, swelling, or pain around your incisions or your groin area. ? More fluid or blood coming from an incision. ? Warmth coming from an incision. ? Pus  or a bad smell coming from an incision. ? A fever or chills.  You have more swelling in your scrotum, if you are male.  You have severe pain and medicines do not help.  You have abdominal pain or swelling.  You cannot urinate or have a bowel movement.  You faint or feel dizzy.  You have nausea and vomiting. Get help right away if:  You have redness, warmth, or pain in your leg.  You have chest pain.  You have problems breathing. These symptoms may represent a serious problem that is an emergency. Do not wait to see if the symptoms will go away. Get medical help right away. Call your local emergency services (911 in the [place] Do not drive yourself to the hospital. Summary  Pain, swelling, and bruising are common after the procedure.  Check your incision area every day for signs of infection, such as  more redness, swelling, or pain.  Put ice on painful or swollen areas for 20 minutes, 2-3 times a day. This information is not intended to replace advice given to you by your health care provider. Make sure you discuss any questions you have with your health care provider. Document Revised: 07/28/2020 Document Reviewed: 07/28/2020 Elsevier Patient Education  2021 [employer].

## 2021-03-29 NOTE — Transfer of Care (Signed)
Immediate Anesthesia Transfer of Care Note  Patient: Joseph Larsen  Procedure(s) Performed: OPEN REPAIR RIGHT INGUINAL HERNIA WITH MESH (Right )  Patient Location: PACU  Anesthesia Type:General  Level of Consciousness: drowsy and patient cooperative  Airway & Oxygen Therapy: Patient Spontanous Breathing and Patient connected to face mask oxygen  Post-op Assessment: Report given to RN and Post -op Vital signs reviewed and stable  Post vital signs: Reviewed and stable  Last Vitals:  Vitals Value Taken Time  BP 141/81 03/29/21 1249  Temp    Pulse 65 03/29/21 1255  Resp 12 03/29/21 1255  SpO2 100 % 03/29/21 1255  Vitals shown include unvalidated device data.  Last Pain:  Vitals:   03/29/21 1025  TempSrc: Oral  PainSc: 0-No pain      Patients Stated Pain Goal: 5 (60/67/70 3403)  Complications: No complications documented.

## 2021-03-29 NOTE — Anesthesia Postprocedure Evaluation (Signed)
Anesthesia Post Note  Patient: Joseph Larsen  Procedure(s) Performed: OPEN REPAIR RIGHT INGUINAL HERNIA WITH MESH (Right )     Patient location during evaluation: PACU Anesthesia Type: General Level of consciousness: awake and alert Pain management: pain level controlled Vital Signs Assessment: post-procedure vital signs reviewed and stable Respiratory status: spontaneous breathing, nonlabored ventilation, respiratory function stable and patient connected to nasal cannula oxygen Cardiovascular status: blood pressure returned to baseline and stable Postop Assessment: no apparent nausea or vomiting Anesthetic complications: no   No complications documented.  Last Vitals:  Vitals:   03/29/21 1300 03/29/21 1315  BP: (!) 146/90 (!) 153/90  Pulse: 65 62  Resp: 16 18  Temp: (!) 36.4 C   SpO2: 98% 99%    Last Pain:  Vitals:   03/29/21 1315  TempSrc:   PainSc: 4                  Dajae Kizer P Caidan Hubbert

## 2021-03-29 NOTE — Op Note (Signed)
Procedure Note  Pre-operative Diagnosis:  Right inguinal hernia, reducible  Post-operative Diagnosis: same  Procedure:  Open right inguinal hernia repair with mesh  Surgeon:  Armandina Gemma, MD  Anesthesia:  General  Preparation:  Chlora-prep  Estimated Blood Loss: minimal  Complications:  none  Indications: The patient presented with a right, reducible hernia.    Patient is referred by Dr. Claretta Fraise for surgical evaluation and management of newly diagnosed right inguinal hernia. Patient's cardiologist is Dr. Jenkins Rouge. Patient first noted a bulge in the right groin in February 2022. He had previously had some discomfort in this area. Patient was evaluated by his primary care physician and underwent an ultrasound examination of the groin on January 18, 2021 which documented a right inguinal hernia containing bowel. Hernia has remained reducible. Patient has had no signs or symptoms of obstruction. His only previous abdominal surgery was laparoscopic cholecystectomy many years ago.   Procedure Details  The patient was evaluated in the holding area. All of the patient's questions were answered and the proposed procedure was confirmed. The site of the procedure was properly marked. The patient was taken to the Operating Room, identified by name, and the procedure verified as inguinal hernia repair.  The patient was placed in the supine position and underwent induction of anesthesia. A "Time Out" was performed per routine. The lower abdomen and groin were prepped and draped in the usual aseptic fashion.  After ascertaining that an adequate level of anesthesia had been obtained, an incision was made in the groin with a #10 blade.  Dissection was carried through the subcutaneous tissues and hemostasis obtained with the electrocautery.  A Gelpi retractor was placed for exposure.  The external oblique fascia was incised in line with it's fibers and extended through the external inguinal  ring.  The cord structures were dissected out of the inguinal canal and encircled with a Penrose drain.  The floor of the inguinal canal was dissected out.  There was no direct defect.  The cord was explored and a moderate sized hernia sac was dissected out.  A high ligation was performed with a 0-Novofil suture ligature and the sac excised and discarded.  The floor of the inguinal canal was reconstructed with Ethicon Ultrapro mesh cut to the appropriate dimensions.  It was secured to the pubic tubercle with a 2-0 Novafil suture and along the inguinal ligament with a running 2-0 Novafil suture.  Mesh was split to accommodate the cord structures.  The superior margin of the mesh was secured to the transversalis and internal oblique musculature with interrupted 2-0 Novafil sutures.  The tails of the mesh were overlapped lateral to the cord structures and secured to the inguinal ligament with interrupted 2-0 Novafil sutures to recreate the internal inguinal ring.  Cord structures were returned to the inguinal canal.  Local anesthetic was infiltrated throughout the field.  External oblique fascia was closed with interrupted 3-0 Vicryl sutures.  Subcutaneous tissues were closed with interrupted 3-0 Vicryl sutures.  Skin was anesthetized with local anesthetic, and the skin edges were re-approximated with a running 4-0 Monocryl suture.  Wound was washed and dried and Dermabond was applied.  Instrument, sponge, and needle counts were correct prior to closure and at the conclusion of the case.  The patient tolerated the procedure well.  The patient was awakened from anesthesia and brought to the recovery room in stable condition.  Armandina Gemma, MD [facility] Office: [phone]

## 2021-03-29 NOTE — Anesthesia Procedure Notes (Signed)
Procedure Name: LMA Insertion Date/Time: 03/29/2021 11:28 AM Performed by: Gwyndolyn Saxon, CRNA Pre-anesthesia Checklist: Patient identified, Emergency Drugs available, Suction available and Patient being monitored Patient Re-evaluated:Patient Re-evaluated prior to induction Oxygen Delivery Method: Circle system utilized Preoxygenation: Pre-oxygenation with 100% oxygen Induction Type: IV induction Ventilation: Mask ventilation without difficulty LMA: LMA inserted LMA Size: 5.0 Number of attempts: 1 Placement Confirmation: positive ETCO2 and breath sounds checked- equal and bilateral Tube secured with: Tape Dental Injury: Teeth and Oropharynx as per pre-operative assessment

## 2021-03-29 NOTE — Interval H&P Note (Signed)
History and Physical Interval Note:  03/29/2021 11:09 AM  Joseph Larsen  has presented today for surgery, with the diagnosis of RIGHT INGUINAL HERNIA, REDUCIBLE.  The various methods of treatment have been discussed with the patient and family. After consideration of risks, benefits and other options for treatment, the patient has consented to    Procedure(s) with comments: OPEN REPAIR RIGHT INGUINAL HERNIA WITH MESH (Right) - 60 MINUTES as a surgical intervention.    The patient's history has been reviewed, patient examined, no change in status, stable for surgery.  I have reviewed the patient's chart and labs.  Questions were answered to the patient's satisfaction.    Armandina Gemma, MD [facility] Office: [place]

## 2021-03-30 ENCOUNTER — Encounter (HOSPITAL_BASED_OUTPATIENT_CLINIC_OR_DEPARTMENT_OTHER): Payer: Self-pay | Admitting: Surgery

## 2021-04-04 ENCOUNTER — Encounter: Payer: Self-pay | Admitting: Gastroenterology

## 2021-05-06 ENCOUNTER — Ambulatory Visit: Payer: Medicare Other

## 2021-05-06 ENCOUNTER — Encounter: Payer: Self-pay | Admitting: Family Medicine

## 2021-05-06 ENCOUNTER — Ambulatory Visit (INDEPENDENT_AMBULATORY_CARE_PROVIDER_SITE_OTHER): Payer: Medicare Other | Admitting: Family Medicine

## 2021-05-06 DIAGNOSIS — J029 Acute pharyngitis, unspecified: Secondary | ICD-10-CM | POA: Diagnosis not present

## 2021-05-06 NOTE — Progress Notes (Signed)
Virtual Visit via Telephone Note  I connected with Joseph Larsen on 05/06/21 at 9:43 AM by telephone and verified that I am speaking with the correct person using two identifiers. Joseph Larsen is currently located at home and his wife is currently with him during this visit. The provider, Loman Brooklyn, FNP is located in their office at time of visit.  I discussed the limitations, risks, security and privacy concerns of performing an evaluation and management service by telephone and the availability of in person appointments. I also discussed with the patient that there may be a patient responsible charge related to this service. The patient expressed understanding and agreed to proceed.  Subjective: PCP: [name], DO  Chief Complaint  Patient presents with  . Sore Throat   Patient complains of sore throat. Onset of symptoms was 1 day ago, gradually worsening since that time. He is drinking plenty of fluids. Evaluation to date: none. Treatment to date: Mucinex. He does not smoke.    ROS: Per HPI  Current Outpatient Medications:  .  acetaminophen (TYLENOL) 500 MG tablet, Take 1,000 mg by mouth daily as needed for moderate pain or headache., Disp: , Rfl:  .  ALPRAZolam (XANAX) 0.5 MG tablet, Take 0.25 mg by mouth at bedtime. , Disp: , Rfl:  .  apixaban (ELIQUIS) 5 MG TABS tablet, Take 1 tablet (5 mg total) by mouth 2 (two) times daily., Disp: 60 tablet, Rfl:  .  aspirin EC 81 MG EC tablet, Take 1 tablet (81 mg total) by mouth daily. Swallow whole., Disp: 30 tablet, Rfl: 11 .  atenolol (TENORMIN) 25 MG tablet, Take 25 mg by mouth daily., Disp: , Rfl:  .  atorvastatin (LIPITOR) 40 MG tablet, Take 1 tablet (40 mg total) by mouth daily. (Patient taking differently: Take 40 mg by mouth daily with lunch.), Disp: 30 tablet, Rfl: 11 .  Cholecalciferol (VITAMIN D) 50 MCG (2000 UT) tablet, Take 2,000 Units by mouth daily., Disp: , Rfl:  .  Cyanocobalamin (B-12 PO), Take 2 tablets by  mouth 2 (two) times daily., Disp: , Rfl:  .  fluticasone (FLONASE) 50 MCG/ACT nasal spray, Place 2 sprays into both nostrils 2 (two) times daily as needed for allergies or rhinitis., Disp: , Rfl:  .  isosorbide mononitrate (IMDUR) 30 MG 24 hr tablet, Take 1 tablet (30 mg total) by mouth daily. (Patient taking differently: Take 30 mg by mouth daily with lunch.), Disp: 90 tablet, Rfl: 3 .  losartan (COZAAR) 100 MG tablet, Take 1 tablet (100 mg total) by mouth daily. (Patient taking differently: Take 100 mg by mouth daily.), Disp: 90 tablet, Rfl: 3 .  omeprazole (PRILOSEC) 20 MG capsule, Take 20 mg by mouth 2 (two) times daily as needed (for heartburn / acid reflux). , Disp: , Rfl:  .  oxyCODONE (OXY IR/ROXICODONE) 5 MG immediate release tablet, Take 1-2 tablets (5-10 mg total) by mouth every 6 (six) hours as needed for moderate pain., Disp: 20 tablet, Rfl: 0 .  oxyCODONE (OXY IR/ROXICODONE) 5 MG immediate release tablet, Take 1-2 tablets (5-10 mg total) by mouth every 6 (six) hours as needed for moderate pain., Disp: 20 tablet, Rfl: 0 .  QUEtiapine (SEROQUEL) 100 MG tablet, Take 25 mg by mouth at bedtime. Pt only takes 1/4 tab, Disp: , Rfl:  .  spironolactone (ALDACTONE) 25 MG tablet, Take 1 tablet (25 mg total) by mouth daily. (Patient taking differently: Take 25 mg by mouth daily with lunch.), Disp:  90 tablet, Rfl: 3 .  tamsulosin (FLOMAX) 0.4 MG CAPS capsule, TAKE 1 CAPSULE BY MOUTH ONCE DAILY AFTER BREAKFAST (Patient taking differently: Take 0.4 mg by mouth daily with lunch.), Disp: 90 capsule, Rfl: 0  Allergies  Allergen Reactions  . Terazosin Rash    Rash when in sun  . Enalapril Maleate Rash  . Lisinopril Itching and Rash  . Penicillins Itching and Rash    35 years ago   . Adhesive [Tape] Rash    bandaids    Past Medical History:  Diagnosis Date  . Allergic rhinitis   . Anxiety   . Arthritis   . BPH (benign prostatic hyperplasia)   . Carotid artery disease ([place])    [place] 9/21: bilat  ICA 1-39   . Coronary artery disease cardiologist--- dr Johnsie Cancel   nuclear stress test showed low risk no ischemia 05-29-2020;  coronary CTA 07-21-2020 cal score 3159 with disease;  cath 08-07-2020 severe multiple cad;  08-26-2020 S/p CABG x4  and MAZE procedure  LIMA -LAD, SVG -Dx, SVG -RI, SVG -OM)  . Depression   . Diverticular disease   . Dyspnea on exertion   . Echocardiogram    Echocardiogram 12/21: EF 60-65, no RWMA, mod asymmetric basal-septal LVH, normal diastolic fn, normal RVSF, RVSP 26, mild MR, mild AV sclerosis (no AS), dilated aortic root (43 mm), dilated ascending aorta (43 mm), no effusion   . ETOH abuse   . First degree heart block   . GERD (gastroesophageal reflux disease)   . Gout    per pt last episode 2019  . Hemorrhoids   . Hiatal hernia   . History of 2019 novel coronavirus disease (COVID-19)    11-27-2019 positive result in care everywhere, per pt mild symptoms that resolved  . HTN (hypertension)   . Hyperlipidemia 09/10/2020  . Low testosterone   . PAF (paroxysmal atrial fibrillation) [facility] cardiologist--- dr Johnsie Cancel--- first dx 08/ 2019   failed DCCV in 6/21 x2   // Flecainide Rx >>DC'd 2/2 CAD // s/p Maze + LAA clipping in 9/21 // Apixaban  . Palpitations   . PTSD (post-traumatic stress disorder)    [name]  . Right inguinal hernia   . S/P Maze operation for atrial fibrillation 08/26/2020   at same time cabg    Observations/Objective: A&O  No respiratory distress or wheezing audible over the phone Mood, judgement, and thought processes all WNL  Assessment and Plan: 1. Sore throat Patient declined testing for COVID-19.  He has no additional symptoms.  Discussed symptom management.  Encouraged throat lozenges, Chloraseptic spray, warm salt water gargles, honey, and Tylenol.  Advised to let us know if he develops further symptoms.   Follow Up Instructions:  I discussed the assessment and treatment plan with the patient. The patient was  provided an opportunity to ask questions and all were answered. The patient agreed with the plan and demonstrated an understanding of the instructions.   The patient was advised to call back or seek an in-person evaluation if the symptoms worsen or if the condition fails to improve as anticipated.  The above assessment and management plan was discussed with the patient. The patient verbalized understanding of and has agreed to the management plan. Patient is aware to call the clinic if symptoms persist or worsen. Patient is aware when to return to the clinic for a follow-up visit. Patient educated on when it is appropriate to go to the emergency department.   Time call ended:  9:49 AM  I provided 6 minutes of non-face-to-face time during this encounter.  Hendricks Limes, MSN, APRN, FNP-C [facility] 05/06/21

## 2021-05-26 ENCOUNTER — Other Ambulatory Visit: Payer: Self-pay

## 2021-05-26 ENCOUNTER — Telehealth: Payer: Self-pay | Admitting: Family Medicine

## 2021-05-26 ENCOUNTER — Encounter: Payer: Self-pay | Admitting: Family Medicine

## 2021-05-26 ENCOUNTER — Ambulatory Visit (INDEPENDENT_AMBULATORY_CARE_PROVIDER_SITE_OTHER): Payer: Medicare Other | Admitting: Family Medicine

## 2021-05-26 VITALS — BP 128/75 | HR 74 | Temp 98.3°F | Ht 71.0 in | Wt 183.8 lb

## 2021-05-26 DIAGNOSIS — L301 Dyshidrosis [pompholyx]: Secondary | ICD-10-CM | POA: Diagnosis not present

## 2021-05-26 DIAGNOSIS — D692 Other nonthrombocytopenic purpura: Secondary | ICD-10-CM | POA: Diagnosis not present

## 2021-05-26 DIAGNOSIS — D6869 Other thrombophilia: Secondary | ICD-10-CM | POA: Diagnosis not present

## 2021-05-26 MED ORDER — MUPIROCIN CALCIUM 2 % EX CREA: 1.0000 "application " | TOPICAL_CREAM | Freq: Two times a day (BID) | CUTANEOUS | 0 refills | Status: DC

## 2021-05-26 MED ORDER — MUPIROCIN 2 % EX OINT: 1.0000 "application " | TOPICAL_OINTMENT | Freq: Two times a day (BID) | CUTANEOUS | 0 refills | Status: AC

## 2021-05-26 NOTE — Patient Instructions (Signed)
Dyshidrotic Eczema Dyshidrotic eczema, also known as pompholyx, is a type of eczema that causes very itchy, fluid-filled blisters (vesicles) to form on the hands and feet. It is more common before age 74, though it can affect people of any age. There is no cure, but treatment and certain lifestylechanges can help relieve symptoms. What are the causes? The cause of this condition is not known. What increases the risk? You are more likely to develop this condition if: You wash your hands frequently. You have a personal or family history of eczema, allergies, asthma, or hay fever. You are allergic to metals, such as nickel or cobalt. You work with cement. You smoke. What are the signs or symptoms? Symptoms of this condition may affect the hands, the feet, or both. Symptoms may come and go (recur), and may include: Severe itching. This may happen before blisters appear. Blisters. These may form suddenly. In the early stages, blisters may form near the fingertips. In severe cases, blisters may grow to large blister masses (bullae). Blisters resolve in 2-3 weeks without bursting. This is followed by a dry phase in which itching eases. Pain and swelling. Cracks or long, narrow openings (fissures) in the skin. Severe dryness. Ridges on the nails. How is this diagnosed? This condition may be diagnosed based on: Your symptoms and a physical exam. Your medical history. Skin scrapings to rule out a fungal infection. Testing a swab of fluid for bacteria (culture). Removing a small piece of skin (biopsy) to test for infection or to rule out other conditions. Skin patch tests. These tests involve using patches that contain possible allergens and placing them on your back. Your health care provider will wait a few days and then check to see if an allergic reaction occurred. These tests may be done if your health care provider suspects allergic reactions, or to rule out other types of eczema. You may be  referred to a health care provider who specializes in skin conditions (dermatologist) to help diagnose and treat this condition. How is this treated? There is no cure for this condition, but treatment can help relieve symptoms. Depending on the amount and severity of the blisters, your health care provider may suggest: Avoiding allergens, irritants, or triggers that worsen symptoms. This may involve lifestyle changes, such as: Using different lotions or soaps. Avoiding hot weather or places that will cause you to sweat a lot. Managing stress with coping techniques, such as relaxation and exercise, and asking for help when you need it. Diet changes as recommended by your health care provider. Using a clean, damp towel (cool compress) to relieve symptoms. Soaking in a bath that contains a type of salt that relieves irritation (aluminum acetate soaks). Medicines, such as: Medicine taken by mouth to reduce itching (oral antihistamines). Medicine applied to the skin to reduce swelling and irritation (topical corticosteroids). Medicine that reduces the activity of the body's disease-fighting system (immunosuppressants) to treat inflammation. This may be given in severe cases. Antibiotic medicines to treat bacterial infection. Light therapy (phototherapy). This involves shining ultraviolet (UV) light on the affected skin in order to reduce itchiness and inflammation. Follow these instructions at home: Bathing and skin care  Wash skin gently. After bathing or washing your hands, pat your skin dry. Avoid rubbing your skin. Remove all jewelry before bathing. If the skin under the jewelry stays wet, blisters may form or get worse. Apply cool compresses as told by your health care provider. To do this: Soak a clean towel in cool  water. Wring out excess water until towel is damp. Place the towel over the affected skin. Leave the towel on for 20 minutes at a time, 2-3 times a day. Use mild soaps,  cleansers, and lotions that do not contain dyes, perfumes, or other irritants. Keep your skin hydrated. To do this: Avoid very hot water. Take lukewarm baths or showers. Apply moisturizer within 3 minutes of bathing. This locks in moisture.  Medicines Take and apply over-the-counter and prescription medicines only as told by your health care provider. If you were prescribed an antibiotic medicine, take or apply it as told by your health care provider. Do not stop using the antibiotic even if you start to feel better. General instructions Do not use any products that contain nicotine or tobacco. These include cigarettes, chewing tobacco, and vaping devices, such as e-cigarettes. If you need help quitting, ask your health care provider. Identify and avoid triggers and allergens. Keep fingernails short to avoid breaking the skin while scratching. Use waterproof gloves to protect your hands when doing work that keeps your hands wet for a long time. Wear socks to keep your feet dry. Keep all follow-up visits. This is important. Contact a health care provider if: You have symptoms that do not go away. You have signs of infection, such as: Crusting, pus, or a bad smell. More redness, swelling, or pain. Increased warmth in the affected area. Get help right away if: Your skin gets streaking redness with associated pain. Summary Dyshidrotic eczema, also known as pompholyx, is a type of eczema that causes very itchy, fluid-filled blisters (vesicles) to form on the hands and feet. The cause of this condition is not known. There is no cure for this condition, but treatment can help relieve symptoms. Treatment depends on the amount and severity of the blisters. Use mild soaps, cleansers, and lotions that do not contain dyes, perfumes, or other irritants. Keep your skin hydrated. This information is not intended to replace advice given to you by your health care provider. Make sure you discuss any  questions you have with your healthcare provider. Document Revised: 09/07/2020 Document Reviewed: 09/07/2020 Elsevier Patient Education  2022 [employer].

## 2021-05-26 NOTE — Telephone Encounter (Signed)
Done

## 2021-05-26 NOTE — Telephone Encounter (Signed)
Wants to switch Mupirocin from cream to oitment because patient don't have insurance.

## 2021-05-26 NOTE — Progress Notes (Signed)
Subjective: CC: Blister between toes PCP: [name], DO GLO:VFIEP E Tatsch is a 75 y.o. male presenting to clinic today for:  1.  Blister between toes Patient reports that he noticed a blister between his toes Sunday morning.  It popped and oozed.  It seems to be better now but he is uncertain as to why this happened.  He is not been applying anything to the affected area.  He does not recall injuring it nor getting any poison oak or ivy on it.  2.  Bruise on face Patient reports that he woke up after a nap with a bruise that appeared along the right eye.  Denies any preceding injury.  No pain.  No change in vision.  He is anticoagulated with Eliquis for atrial fibrillation.   ROS: Per HPI  Allergies  Allergen Reactions   Terazosin Rash    Rash when in sun   Enalapril Maleate Rash   Lisinopril Itching and Rash   Penicillins Itching and Rash    35 years ago    Adhesive [Tape] Rash    bandaids    Past Medical History:  Diagnosis Date   Allergic rhinitis    Anxiety    Arthritis    BPH (benign prostatic hyperplasia)    Carotid artery disease ([place])    [place] 9/21: bilat ICA 1-39    Coronary artery disease cardiologist--- dr Johnsie Cancel   nuclear stress test showed low risk no ischemia 05-29-2020;  coronary CTA 07-21-2020 cal score 3159 with disease;  cath 08-07-2020 severe multiple cad;  08-26-2020 S/p CABG x4  and MAZE procedure  LIMA -LAD, SVG -Dx, SVG -RI, SVG -OM)   Depression    Diverticular disease    Dyspnea on exertion    Echocardiogram    Echocardiogram 12/21: EF 60-65, no RWMA, mod asymmetric basal-septal LVH, normal diastolic fn, normal RVSF, RVSP 26, mild MR, mild AV sclerosis (no AS), dilated aortic root (43 mm), dilated ascending aorta (43 mm), no effusion    ETOH abuse    First degree heart block    GERD (gastroesophageal reflux disease)    Gout    per pt last episode 2019   Hemorrhoids    Hiatal hernia    History of 2019 novel coronavirus disease  (COVID-19)    11-27-2019 positive result in care everywhere, per pt mild symptoms that resolved   HTN (hypertension)    Hyperlipidemia 09/10/2020   Low testosterone    PAF (paroxysmal atrial fibrillation) [facility]) cardiologist--- dr Johnsie Cancel--- first dx 08/ 2019   failed DCCV in 6/21 x2   // Flecainide Rx >>DC'd 2/2 CAD // s/p Maze + LAA clipping in 9/21 // Apixaban   Palpitations    PTSD (post-traumatic stress disorder)    [name] - [employer]   Right inguinal hernia    S/P Maze operation for atrial fibrillation 08/26/2020   at same time cabg    Current Outpatient Medications:    acetaminophen (TYLENOL) 500 MG tablet, Take 1,000 mg by mouth daily as needed for moderate pain or headache., Disp: , Rfl:    ALPRAZolam (XANAX) 0.5 MG tablet, Take 0.25 mg by mouth at bedtime. , Disp: , Rfl:    aspirin EC 81 MG EC tablet, Take 1 tablet (81 mg total) by mouth daily. Swallow whole., Disp: 30 tablet, Rfl: 11   atenolol (TENORMIN) 25 MG tablet, Take 25 mg by mouth daily., Disp: , Rfl:    atorvastatin (LIPITOR) 40 MG tablet, Take 1 tablet (  40 mg total) by mouth daily. (Patient taking differently: Take 40 mg by mouth daily with lunch.), Disp: 30 tablet, Rfl: 11   Cholecalciferol (VITAMIN D) 50 MCG (2000 UT) tablet, Take 2,000 Units by mouth daily., Disp: , Rfl:    Cyanocobalamin (B-12 PO), Take 2 tablets by mouth 2 (two) times daily., Disp: , Rfl:    fluticasone (FLONASE) 50 MCG/ACT nasal spray, Place 2 sprays into both nostrils 2 (two) times daily as needed for allergies or rhinitis., Disp: , Rfl:    isosorbide mononitrate (IMDUR) 30 MG 24 hr tablet, Take 1 tablet (30 mg total) by mouth daily. (Patient taking differently: Take 30 mg by mouth daily with lunch.), Disp: 90 tablet, Rfl: 3   losartan (COZAAR) 100 MG tablet, Take 1 tablet (100 mg total) by mouth daily. (Patient taking differently: Take 100 mg by mouth daily.), Disp: 90 tablet, Rfl: 3   omeprazole (PRILOSEC) 20 MG capsule, Take 20 mg by mouth 2  (two) times daily as needed (for heartburn / acid reflux). , Disp: , Rfl:    oxyCODONE (OXY IR/ROXICODONE) 5 MG immediate release tablet, Take 1-2 tablets (5-10 mg total) by mouth every 6 (six) hours as needed for moderate pain., Disp: 20 tablet, Rfl: 0   oxyCODONE (OXY IR/ROXICODONE) 5 MG immediate release tablet, Take 1-2 tablets (5-10 mg total) by mouth every 6 (six) hours as needed for moderate pain., Disp: 20 tablet, Rfl: 0   QUEtiapine (SEROQUEL) 100 MG tablet, Take 25 mg by mouth at bedtime. Pt only takes 1/4 tab, Disp: , Rfl:    spironolactone (ALDACTONE) 25 MG tablet, Take 1 tablet (25 mg total) by mouth daily. (Patient taking differently: Take 25 mg by mouth daily with lunch.), Disp: 90 tablet, Rfl: 3   tamsulosin (FLOMAX) 0.4 MG CAPS capsule, TAKE 1 CAPSULE BY MOUTH ONCE DAILY AFTER BREAKFAST (Patient taking differently: Take 0.4 mg by mouth daily with lunch.), Disp: 90 capsule, Rfl: 0   apixaban (ELIQUIS) 5 MG TABS tablet, Take 1 tablet (5 mg total) by mouth 2 (two) times daily., Disp: 60 tablet, Rfl:  Social History   Socioeconomic History   Marital status: Married    Spouse name: Suanne Marker   Number of children: 1   Years of education: Not on file   Highest education level: Not on file  Occupational History   Occupation: disabled    Employer: RETIRED    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  Tobacco Use   Smoking status: Former    Packs/day: 3.00    Years: 20.00    Pack years: 60.00    Types: Cigarettes    Start date: 02/09/1965    Quit date: 05/12/1977    Years since quitting: 44.0   Smokeless tobacco: Never  Vaping Use   Vaping Use: Never used  Substance and Sexual Activity   Alcohol use: Not Currently    Alcohol/week: 7.0 - 14.0 standard drinks    Types: 7 - 14 Cans of beer per week    Comment: 03-23-2021 per pt 1 to 2 beers daily (hx alcohol abuse   Drug use: Never   Sexual activity: Yes  Other Topics Concern   Not on file  Social History Narrative   Lives  with wife in [place] in a one story home.  Exercises @ gym 3x/wk - stationary bike and light weight training.   Social Determinants of Health   Financial Resource Strain: Not on file  Food Insecurity: Not on file  Transportation Needs: Not on  file  Physical Activity: Not on file  Stress: Not on file  Social Connections: Not on file  Intimate Partner Violence: Not on file   Family History  Problem Relation Age of Onset   Heart disease Mother        "died of chf"   Heart disease Father        "died of old age"   Hypertension Sister    Liver disease Brother    Hypertension Brother    Hypertension Sister    Hypertension Sister    Hypertension Sister    Heart murmur Sister    Colon cancer Neg Hx     Objective: Office vital signs reviewed. BP 128/75   Pulse 74   Temp 98.3 F (36.8 C)   Ht 5\' 11"  (1.803 m)   Wt 183 lb 12.8 oz (83.4 kg)   SpO2 99%   BMI 25.63 kg/m   Physical Examination:  General: Awake, alert, well appearing, No acute distress Extremities: warm, well perfused, No edema, cyanosis or clubbing; +2 pulses bilaterally Skin: He has a ruptured blister noted between digits 2 and 3.  There is no active oozing, erythema or odor is noted.  There are several purpura noted along the upper extremities and a very small purpura noted along the upper right cheek.  Assessment/ Plan: 75 y.o. male   Dyshidrotic eczema - Plan: DISCONTINUED: mupirocin cream (BACTROBAN) 2 %  Acquired thrombophilia ([place])  Purpura senilis ([place])  I suspect that lesion is secondary to dyshidrotic eczema.  I have placed him on Bactroban ointment to the affected area just for the next few days to cover for any possible secondary infection, though I did not appreciate anything that suggested that during today's visit.  We discussed home care instructions and a handout was provided.  He will follow-up as needed  Purpura noted along the right upper cheek is likely secondary to chronic  anticoagulation.  There was no overt injury that he recalled.  He is got purpura noted along the upper extremities as well.  We discussed that if he continues to develop more of these along the face without any injury I would like to see him back for platelet level.  His previous CBC has been normal.  No orders of the defined types were placed in this encounter.  No orders of the defined types were placed in this encounter.    [name], DO [place] [phone]

## 2021-06-15 ENCOUNTER — Telehealth: Payer: Self-pay

## 2021-06-15 ENCOUNTER — Ambulatory Visit: Payer: Medicare Other | Admitting: Physical Therapy

## 2021-06-15 ENCOUNTER — Other Ambulatory Visit: Payer: Self-pay

## 2021-06-15 ENCOUNTER — Encounter: Payer: Self-pay | Admitting: Gastroenterology

## 2021-06-15 ENCOUNTER — Ambulatory Visit (INDEPENDENT_AMBULATORY_CARE_PROVIDER_SITE_OTHER): Payer: Medicare Other | Admitting: Gastroenterology

## 2021-06-15 VITALS — BP 122/74 | HR 76 | Ht 71.0 in | Wt 185.0 lb

## 2021-06-15 DIAGNOSIS — Z1211 Encounter for screening for malignant neoplasm of colon: Secondary | ICD-10-CM | POA: Diagnosis not present

## 2021-06-15 DIAGNOSIS — K31A0 Gastric intestinal metaplasia, unspecified: Secondary | ICD-10-CM

## 2021-06-15 DIAGNOSIS — Z7901 Long term (current) use of anticoagulants: Secondary | ICD-10-CM | POA: Diagnosis not present

## 2021-06-15 DIAGNOSIS — Z1212 Encounter for screening for malignant neoplasm of rectum: Secondary | ICD-10-CM

## 2021-06-15 MED ORDER — PLENVU 140 G PO SOLR: 1.0000 | Freq: Once | ORAL | 0 refills | Status: AC

## 2021-06-15 NOTE — Telephone Encounter (Signed)
[facility] Pre-operative Risk Assessment     Request for surgical clearance:     Endoscopy Procedure  What type of surgery is being performed?     Colonoscopy  When is this surgery scheduled?     06/25/21  What type of clearance is required ?   Pharmacy  Are there any medications that need to be held prior to surgery and how long? Eliquis x 2 days  Practice name and name of physician performing surgery?      [facility]  What is your office phone and fax number?      Phone- [phone]  [phone]  Anesthesia type (None, local, MAC, general) ?       MAC

## 2021-06-15 NOTE — Progress Notes (Signed)
History of Present Illness: This is a 75 year old male referred by [name], DO for the evaluation of CRC screening. 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 Adria Devon at [facility]. He has had several EGD from 2017 to 2021.  EGD report not available however the most recent gastric pathology in 03/2020 showed focal metaplasia without dysplasia.  Patient relates that subsequent endoscopies have not revealed dysplasia.  He takes omeprazole bid prn reflux symptoms.  Colonoscopy in 08/2010 for screening showed diverticulosis and internal hemorrhoids.  He is due for screening colonoscopy.  He is maintained on Eliquis for afib.  Denies weight loss, abdominal pain, constipation, diarrhea, change in stool caliber, melena, hematochezia, nausea, vomiting, dysphagia, chest pain.     Allergies  Allergen Reactions   Terazosin Rash    Rash when in sun   Enalapril Maleate Rash   Lisinopril Itching and Rash   Penicillins Itching and Rash    35 years ago    Adhesive [Tape] Rash    bandaids    Outpatient Medications Prior to Visit  Medication Sig Dispense Refill   acetaminophen (TYLENOL) 500 MG tablet Take 1,000 mg by mouth daily as needed for moderate pain or headache.     ALPRAZolam (XANAX) 0.5 MG tablet Take 0.25 mg by mouth at bedtime.      aspirin EC 81 MG EC tablet Take 1 tablet (81 mg total) by mouth daily. Swallow whole. 30 tablet 11   atenolol (TENORMIN) 25 MG tablet Take 25 mg by mouth daily.     atorvastatin (LIPITOR) 40 MG tablet Take 1 tablet (40 mg total) by mouth daily. (Patient taking differently: Take 40 mg by mouth daily with lunch.) 30 tablet 11   Cholecalciferol (VITAMIN D) 50 MCG (2000 UT) tablet Take 2,000 Units by mouth daily.     Cyanocobalamin (B-12 PO) Take 2 tablets by mouth 2 (two) times daily.     fluticasone (FLONASE) 50 MCG/ACT nasal spray Place 2 sprays into both nostrils 2 (two) times daily as needed for allergies or  rhinitis.     isosorbide mononitrate (IMDUR) 30 MG 24 hr tablet Take 1 tablet (30 mg total) by mouth daily. (Patient taking differently: Take 30 mg by mouth daily with lunch.) 90 tablet 3   losartan (COZAAR) 100 MG tablet Take 1 tablet (100 mg total) by mouth daily. (Patient taking differently: Take 100 mg by mouth daily.) 90 tablet 3   omeprazole (PRILOSEC) 20 MG capsule Take 20 mg by mouth 2 (two) times daily as needed (for heartburn / acid reflux).      QUEtiapine (SEROQUEL) 100 MG tablet Take 25 mg by mouth at bedtime. Pt only takes 1/4 tab     spironolactone (ALDACTONE) 25 MG tablet Take 1 tablet (25 mg total) by mouth daily. (Patient taking differently: Take 25 mg by mouth daily with lunch.) 90 tablet 3   tamsulosin (FLOMAX) 0.4 MG CAPS capsule TAKE 1 CAPSULE BY MOUTH ONCE DAILY AFTER BREAKFAST (Patient taking differently: Take 0.4 mg by mouth daily with lunch.) 90 capsule 0   apixaban (ELIQUIS) 5 MG TABS tablet Take 1 tablet (5 mg total) by mouth 2 (two) times daily. 60 tablet    oxyCODONE (OXY IR/ROXICODONE) 5 MG immediate release tablet Take 1-2 tablets (5-10 mg total) by mouth every 6 (six) hours as needed for moderate pain. 20 tablet 0   oxyCODONE (OXY IR/ROXICODONE) 5 MG immediate release tablet Take  1-2 tablets (5-10 mg total) by mouth every 6 (six) hours as needed for moderate pain. 20 tablet 0   No facility-administered medications prior to visit.   Past Medical History:  Diagnosis Date   Allergic rhinitis    Anxiety    Arthritis    BPH (benign prostatic hyperplasia)    Carotid artery disease ([place])    [place] 9/21: bilat ICA 1-39    Coronary artery disease cardiologist--- dr Johnsie Cancel   nuclear stress test showed low risk no ischemia 05-29-2020;  coronary CTA 07-21-2020 cal score 3159 with disease;  cath 08-07-2020 severe multiple cad;  08-26-2020 S/p CABG x4  and MAZE procedure  LIMA -LAD, SVG -Dx, SVG -RI, SVG -OM)   Depression    Diverticular disease    Dyspnea on exertion     Echocardiogram    Echocardiogram 12/21: EF 60-65, no RWMA, mod asymmetric basal-septal LVH, normal diastolic fn, normal RVSF, RVSP 26, mild MR, mild AV sclerosis (no AS), dilated aortic root (43 mm), dilated ascending aorta (43 mm), no effusion    ETOH abuse    First degree heart block    GERD (gastroesophageal reflux disease)    Gout    per pt last episode 2019   Hemorrhoids    Hiatal hernia    History of 2019 novel coronavirus disease (COVID-19)    11-27-2019 positive result in care everywhere, per pt mild symptoms that resolved   HTN (hypertension)    Hyperlipidemia 09/10/2020   Low testosterone    PAF (paroxysmal atrial fibrillation) [facility] cardiologist--- dr Johnsie Cancel--- first dx 08/ 2019   failed DCCV in 6/21 x2   // Flecainide Rx >>DC'd 2/2 CAD // s/p Maze + LAA clipping in 9/21 // Apixaban   Palpitations    PTSD (post-traumatic stress disorder)    [name]   Right inguinal hernia    S/P Maze operation for atrial fibrillation 08/26/2020   at same time cabg   Past Surgical History:  Procedure Laterality Date   CARDIOVERSION N/A 05/15/2020   Procedure: CARDIOVERSION;  Surgeon: Sueanne Margarita, MD;  Location[facility] ENDOSCOPY;  Service: Cardiovascular;  Laterality: N/A;   CARDIOVERSION N/A 06/01/2020   Procedure: CARDIOVERSION;  Surgeon: Elouise Munroe, MD;  Location: [facility] ENDOSCOPY;  Service: Cardiovascular;  Laterality: N/A;   CLIPPING OF ATRIAL APPENDAGE N/A 08/26/2020   Procedure: CLIPPING OF ATRIAL APPENDAGE;  Surgeon: Gaye Pollack, MD;  Location: [facility];  Service: Open Heart Surgery;  Laterality: N/A;   CORONARY ARTERY BYPASS GRAFT N/A 08/26/2020   Procedure: CORONARY ARTERY BYPASS GRAFTING TIMES FOUR USING LEFT INTERNAL MAMMARY ARTERY AND ENDOSCOPICALLY HARVESTED RIGHT GREATER SAPHENOUS VEIN;  Surgeon: Gaye Pollack, MD;  Location: [place];  Service: Open Heart Surgery;  Laterality: N/A;   ESOPHAGOGASTRODUODENOSCOPY  last one 03-24-2020  @[facility]    INGUINAL HERNIA REPAIR  Right 03/29/2021   Procedure: OPEN REPAIR RIGHT INGUINAL HERNIA WITH MESH;  Surgeon: Armandina Gemma, MD;  Location: [facility];  Service: General;  Laterality: Right;  60 MINUTES   KNEE ARTHROSCOPY Bilateral 2010;  2012   LAPAROSCOPIC CHOLECYSTECTOMY  08-31-2005  @[facility]    LEFT HEART CATH AND CORONARY ANGIOGRAPHY N/A 08/07/2020   Procedure: LEFT HEART CATH AND CORONARY ANGIOGRAPHY;  Surgeon: Martinique, Peter M, MD;  Location: [facility];  Service: Cardiovascular;  Laterality: N/A;   MAZE N/A 08/26/2020   Procedure: MAZE;  Surgeon: Gaye Pollack, MD;  Location: [facility] OR;  Service: Open Heart Surgery;  Laterality:  N/A;   SHOULDER ARTHROSCOPY Right 01/14/2020   TEE WITHOUT CARDIOVERSION N/A 08/26/2020   Procedure: TRANSESOPHAGEAL ECHOCARDIOGRAM (TEE);  Surgeon: Gaye Pollack, MD;  Location: [place];  Service: Open Heart Surgery;  Laterality: N/A;   Social History   Socioeconomic History   Marital status: Married    Spouse name: Suanne Marker   Number of children: 1   Years of education: Not on file   Highest education level: Not on file  Occupational History   Occupation: disabled    Employer: RETIRED    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  Tobacco Use   Smoking status: Former    Packs/day: 3.00    Years: 20.00    Pack years: 60.00    Types: Cigarettes    Start date: 02/09/1965    Quit date: 05/12/1977    Years since quitting: 44.1   Smokeless tobacco: Never  Vaping Use   Vaping Use: Never used  Substance and Sexual Activity   Alcohol use: Not Currently    Alcohol/week: 7.0 - 14.0 standard drinks    Types: 7 - 14 Cans of beer per week    Comment: 03-23-2021 per pt 1 to 2 beers daily (hx alcohol abuse   Drug use: Never   Sexual activity: Yes  Other Topics Concern   Not on file  Social History Narrative   Lives with wife in [place] in a one story home.  Exercises @ gym 3x/wk - stationary bike and light weight training.   Social Determinants of Health    Financial Resource Strain: Not on file  Food Insecurity: Not on file  Transportation Needs: Not on file  Physical Activity: Not on file  Stress: Not on file  Social Connections: Not on file   Family History  Problem Relation Age of Onset   Heart disease Mother        "died of chf"   Heart disease Father        "died of old age"   Hypertension Sister    Liver disease Brother    Hypertension Brother    Hypertension Sister    Hypertension Sister    Hypertension Sister    Heart murmur Sister    Colon cancer Neg Hx        Review of Systems: Pertinent positive and negative review of systems were noted in the above HPI section. All other review of systems were otherwise negative.    Physical Exam: General: Well developed, well nourished, no acute distress Head: Normocephalic and atraumatic Eyes: Sclerae anicteric, EOMI Ears: Normal auditory acuity Mouth: Not examined, mask on during Covid-19 pandemic Neck: Supple, no masses or thyromegaly Lungs: Clear throughout to auscultation Heart: Regular rate and rhythm; no murmurs, rubs or bruits Abdomen: Soft, non tender and non distended. No masses, hepatosplenomegaly or hernias noted. Normal Bowel sounds Rectal: Deferred to colonoscopy  Musculoskeletal: Symmetrical with no gross deformities  Skin: No lesions on visible extremities Pulses:  Normal pulses noted Extremities: No clubbing, cyanosis, edema or deformities noted Neurological: Alert oriented x 4, grossly nonfocal Cervical Nodes:  No significant cervical adenopathy Inguinal Nodes: No significant inguinal adenopathy Psychological:  Alert and cooperative. Normal mood and affect   Assessment and Recommendations:  CRC screening, average risk.  He is due for screening colonoscopy.  Schedule colonoscopy. The risks (including bleeding, perforation, infection, missed lesions, medication reactions and possible hospitalization or surgery if complications occur), benefits, and  alternatives to colonoscopy with possible biopsy and possible polypectomy were discussed with the patient  and they consent to proceed.    2.  GERD.  Follow antireflux measures.  Continue omeprazole 20 mg p.o. twice daily as needed.  3.  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  Continue follow up with Dr. Adria Devon at [facility].  He is advised to contact Dr. Samuel Jester office to determine the timing of his follow-up appointment.  4.  Afib. Hold Eliquis 2 days before procedure - will instruct when and how to resume after procedure. Low but real risk of cardiovascular event such as heart attack, stroke, embolism, thrombosis or ischemia/infarct of other organs off Eliquis explained and need to seek urgent help if this occurs. The patient consents to proceed. Will communicate by phone or EMR with patient's prescribing provider to confirm that holding Eliquis is reasonable in this case.      cc: [name], DO [address] [place],  [place] [address]

## 2021-06-15 NOTE — Therapy (Signed)
[facility] [place], [place], [address] Phone: [phone]   Fax:  [phone]  Patient Details  Name: Joseph Larsen MRN: [id number] Date of Birth: January 14, 1946 Referring Provider:  Rickard Rhymes*  Encounter Date: 06/15/2021  [place] and up/down multiple flights of stairs with no pain.  Will call clinic if he feels he needs treatment. Audrionna Lampton, [place] MPT 06/15/2021, 8:58 AM  [facility] [address] [place], [place], [address] Phone: [phone]   Fax:  [phone]

## 2021-06-15 NOTE — Patient Instructions (Signed)
You have been scheduled for a colonoscopy. Please follow written instructions given to you at your visit today.  Please pick up your prep supplies at the pharmacy within the next 1-3 days. If you use inhalers (even only as needed), please bring them with you on the day of your procedure.  Normal BMI (Body Mass Index- based on height and weight) is between 19 and 25. Your BMI today is Body mass index is 25.8 kg/m. Marland Kitchen Please consider follow up  regarding your BMI with your Primary Care Provider.  Due to recent changes in healthcare laws, you may see the results of your imaging and laboratory studies on MyChart before your provider has had a chance to review them.  We understand that in some cases there may be results that are confusing or concerning to you. Not all laboratory results come back in the same time frame and the provider may be waiting for multiple results in order to interpret others.  Please give [place] 48 hours in order for your provider to thoroughly review all the results before contacting the office for clarification of your results.   The [facility] providers would like to encourage you to use [facility] to communicate with providers for non-urgent requests or questions.  Due to long hold times on the telephone, sending your provider a message by [facility] may be a faster and more efficient way to get a response.  Please allow 48 business hours for a response.  Please remember that this is for non-urgent requests.   Thank you for choosing me and [facility].  Pricilla Riffle. Dagoberto Ligas., MD., Marval Regal

## 2021-06-15 NOTE — Telephone Encounter (Signed)
Patient with diagnosis of atrial fibrillation on Eliquis for anticoagulation.    Procedure: colonoscopy Date of procedure: 06/25/21   CHA2DS2-VASc Score = 3  This indicates a 3.2% annual risk of stroke. The patient's score is based upon: CHF History: No HTN History: Yes Diabetes History: No Stroke History: No Vascular Disease History: Yes Age Score: 1 Gender Score: 0  CrCl 109.9 Platelet count 152  Per office protocol, patient can hold Eliquis for 2 days prior to procedure.   Patient will not need bridging with Lovenox (enoxaparin) around procedure.

## 2021-06-15 NOTE — Telephone Encounter (Signed)
   Primary Cardiologist: Jenkins Rouge, MD  Chart reviewed as part of pre-operative protocol coverage. Given past medical history and time since last visit, based on ACC/AHA guidelines, Joseph Larsen would be at acceptable risk for the planned procedure without further cardiovascular testing.   He remains very physically active and is able to complete greater than 4 METS of physical activity.  Patient with diagnosis of atrial fibrillation on Eliquis for anticoagulation.     Procedure: colonoscopy Date of procedure: 06/25/21     CHA2DS2-VASc Score = 3  This indicates a 3.2% annual risk of stroke. The patient's score is based upon: CHF History: No HTN History: Yes Diabetes History: No Stroke History: No Vascular Disease History: Yes Age Score: 1 Gender Score: 0   CrCl 109.9 Platelet count 152   Per office protocol, patient can hold Eliquis for 2 days prior to procedure.   Patient will not need bridging with Lovenox (enoxaparin) around procedure.  Patient was advised that if he develops new symptoms prior to surgery to contact our office to arrange a follow-up appointment.    I will route this recommendation to the requesting party via Epic fax function and remove from pre-op pool.  Please call with questions.  Joseph Ng. Jakaria Lavergne NP-C    06/15/2021, 3:01 PM [place] [address] Office [phone] Fax [phone]

## 2021-06-22 DIAGNOSIS — L814 Other melanin hyperpigmentation: Secondary | ICD-10-CM | POA: Diagnosis not present

## 2021-06-22 DIAGNOSIS — D0461 Carcinoma in situ of skin of right upper limb, including shoulder: Secondary | ICD-10-CM | POA: Diagnosis not present

## 2021-06-22 DIAGNOSIS — L821 Other seborrheic keratosis: Secondary | ICD-10-CM | POA: Diagnosis not present

## 2021-06-22 DIAGNOSIS — Z85828 Personal history of other malignant neoplasm of skin: Secondary | ICD-10-CM | POA: Diagnosis not present

## 2021-06-22 DIAGNOSIS — D1801 Hemangioma of skin and subcutaneous tissue: Secondary | ICD-10-CM | POA: Diagnosis not present

## 2021-06-22 DIAGNOSIS — L905 Scar conditions and fibrosis of skin: Secondary | ICD-10-CM | POA: Diagnosis not present

## 2021-06-22 DIAGNOSIS — L570 Actinic keratosis: Secondary | ICD-10-CM | POA: Diagnosis not present

## 2021-06-22 NOTE — Progress Notes (Deleted)
Cardiology Office Note   Date:  06/22/2021   ID:  Joseph Larsen, DOB 1946/07/30, MRN [id number]  PCP:  [name], DO  Cardiologist:  Dr. Johnsie Cancel    No chief complaint on file.     History of Present Illness: Joseph Larsen is a 75 y.o. male who presents for ***  Coronary artery disease S/p CABG 9/21 (Dr. Cyndia Bent; L-LAD, S-Dx, S-RI, S-OM) Paroxysmal >> persistent atrial fibrillation S/p DCCV 05/15/20 >> ERAF Flecainide started 6/21>>DCd 2/2 coronary artery disease  ETT-Myoview - no ischemia; no VT DCCV 06/01/20 >> NSR S/p bi-atrial MAZE and LAA clipping 9/21 Hypertension Hyperlipidemia Alcohol abuse Echo 6/19: EF 60-65 Echo 5/21: EF 55-60, LAE, LVH Echo 12/07/20 EF 60-65% aortic root 4.3 m Carotid artery Dz [place] 9/21: bilat ICA 1-39      Prior CV studies: Carotid US 08/20/20 B/L ICA 1-39   Cardiac catheterization 08/07/20 LM mid 50 LAD prox 90 RI 90 LCx ost 70, mid 50 RCA prox 30 EF 55-65   Coronary CTA 07/21/20 Ca score 3159 (95th percentile) 1. Left Main:  No significant stenosis. FFR = 0.89 2. LAD: Significant stenosis. FFR 0.76 at takeoff of first diagonal, Mid FFR = 0.66, Distal FFR = 0.58 3. LCX: No significant stenosis. Proximal FFR = 0.86 4. RCA: Unable to be interpreted due to artifact. IMPRESSION: 1. CT FFR analysis showed significant stenosis in the LAD as noted, beginning near the takeoff of the first significant diagonal and extending distally.   ETT-Myoview 05/29/2020 Small inferior apical wall infarct, no ischemia, not gated due to atrial fibrillation, low risk ETT portion normal   Event monitor 05/04/2020 Atrial fibrillation, average heart rate 81, 3.1-second pause, <1% PVCs   Echocardiogram 04/23/2020 EF 55-60, no RWMA, mild LVH, normal RV SF, severe LAE, mildly dilated aortic root (39 mm), dilated ascending aorta (40 mm)    CAD/CABG August 26 2020 see above Has done well post op echo with normal EF and aortic root 4.3 cm he has had  PAF and been on flecainide but this was stopped when his CAD diagnosed Has been on eliquis    Recovering well Sold his [name] No palpitations and no bleeding issues on eliquis   Last visit 12/22/20 BP stable and doing well post cabg  Past Medical History:  Diagnosis Date   Allergic rhinitis    Anxiety    Arthritis    BPH (benign prostatic hyperplasia)    Carotid artery disease ([place])    [place] 9/21: bilat ICA 1-39    Coronary artery disease cardiologist--- dr Johnsie Cancel   nuclear stress test showed low risk no ischemia 05-29-2020;  coronary CTA 07-21-2020 cal score 3159 with disease;  cath 08-07-2020 severe multiple cad;  08-26-2020 S/p CABG x4  and MAZE procedure  LIMA -LAD, SVG -Dx, SVG -RI, SVG -OM)   Depression    Diverticular disease    Dyspnea on exertion    Echocardiogram    Echocardiogram 12/21: EF 60-65, no RWMA, mod asymmetric basal-septal LVH, normal diastolic fn, normal RVSF, RVSP 26, mild MR, mild AV sclerosis (no AS), dilated aortic root (43 mm), dilated ascending aorta (43 mm), no effusion    ETOH abuse    First degree heart block    GERD (gastroesophageal reflux disease)    Gout    per pt last episode 2019   Hemorrhoids    Hiatal hernia    History of 2019 novel coronavirus disease (COVID-19)    11-27-2019 positive result in care  everywhere, per pt mild symptoms that resolved   HTN (hypertension)    Hyperlipidemia 09/10/2020   Low testosterone    PAF (paroxysmal atrial fibrillation) [facility]) cardiologist--- dr Johnsie Cancel--- first dx 08/ 2019   failed DCCV in 6/21 x2   // Flecainide Rx >>DC'd 2/2 CAD // s/p Maze + LAA clipping in 9/21 // Apixaban   Palpitations    PTSD (post-traumatic stress disorder)    [name]   Right inguinal hernia    S/P Maze operation for atrial fibrillation 08/26/2020   at same time cabg    Past Surgical History:  Procedure Laterality Date   CARDIOVERSION N/A 05/15/2020   Procedure: CARDIOVERSION;  Surgeon: Sueanne Margarita, MD;  Location: [facility]  ENDOSCOPY;  Service: Cardiovascular;  Laterality: N/A;   CARDIOVERSION N/A 06/01/2020   Procedure: CARDIOVERSION;  Surgeon: Elouise Munroe, MD;  Location: [facility] ENDOSCOPY;  Service: Cardiovascular;  Laterality: N/A;   CLIPPING OF ATRIAL APPENDAGE N/A 08/26/2020   Procedure: CLIPPING OF ATRIAL APPENDAGE;  Surgeon: Gaye Pollack, MD;  Location: [place];  Service: Open Heart Surgery;  Laterality: N/A;   CORONARY ARTERY BYPASS GRAFT N/A 08/26/2020   Procedure: CORONARY ARTERY BYPASS GRAFTING TIMES FOUR USING LEFT INTERNAL MAMMARY ARTERY AND ENDOSCOPICALLY HARVESTED RIGHT GREATER SAPHENOUS VEIN;  Surgeon: Gaye Pollack, MD;  Location: [place];  Service: Open Heart Surgery;  Laterality: N/A;   ESOPHAGOGASTRODUODENOSCOPY  last one 03-24-2020  @[facility]    INGUINAL HERNIA REPAIR Right 03/29/2021   Procedure: OPEN REPAIR RIGHT INGUINAL HERNIA WITH MESH;  Surgeon: Armandina Gemma, MD;  Location: [place];  Service: General;  Laterality: Right;  60 MINUTES   KNEE ARTHROSCOPY Bilateral 2010;  2012   LAPAROSCOPIC CHOLECYSTECTOMY  08-31-2005  @[facility]    LEFT HEART CATH AND CORONARY ANGIOGRAPHY N/A 08/07/2020   Procedure: LEFT HEART CATH AND CORONARY ANGIOGRAPHY;  Surgeon: Martinique, Peter M, MD;  Location: [facility] CV LAB;  Service: Cardiovascular;  Laterality: N/A;   MAZE N/A 08/26/2020   Procedure: MAZE;  Surgeon: Gaye Pollack, MD;  Location: [facility] OR;  Service: Open Heart Surgery;  Laterality: N/A;   SHOULDER ARTHROSCOPY Right 01/14/2020   TEE WITHOUT CARDIOVERSION N/A 08/26/2020   Procedure: TRANSESOPHAGEAL ECHOCARDIOGRAM (TEE);  Surgeon: Gaye Pollack, MD;  Location: [place];  Service: Open Heart Surgery;  Laterality: N/A;     Current Outpatient Medications  Medication Sig Dispense Refill   acetaminophen (TYLENOL) 500 MG tablet Take 1,000 mg by mouth daily as needed for moderate pain or headache.     ALPRAZolam (XANAX) 0.5 MG tablet Take 0.25 mg by mouth at bedtime.      apixaban (ELIQUIS) 5 MG TABS  tablet Take 1 tablet (5 mg total) by mouth 2 (two) times daily. 60 tablet    aspirin EC 81 MG EC tablet Take 1 tablet (81 mg total) by mouth daily. Swallow whole. 30 tablet 11   atenolol (TENORMIN) 25 MG tablet Take 25 mg by mouth daily.     atorvastatin (LIPITOR) 40 MG tablet Take 1 tablet (40 mg total) by mouth daily. (Patient taking differently: Take 40 mg by mouth daily with lunch.) 30 tablet 11   Cholecalciferol (VITAMIN D) 50 MCG (2000 UT) tablet Take 2,000 Units by mouth daily.     Cyanocobalamin (B-12 PO) Take 2 tablets by mouth 2 (two) times daily.     fluticasone (FLONASE) 50 MCG/ACT nasal spray Place 2 sprays into both nostrils 2 (two) times daily as needed for allergies  or rhinitis.     isosorbide mononitrate (IMDUR) 30 MG 24 hr tablet Take 1 tablet (30 mg total) by mouth daily. (Patient taking differently: Take 30 mg by mouth daily with lunch.) 90 tablet 3   losartan (COZAAR) 100 MG tablet Take 1 tablet (100 mg total) by mouth daily. (Patient taking differently: Take 100 mg by mouth daily.) 90 tablet 3   omeprazole (PRILOSEC) 20 MG capsule Take 20 mg by mouth 2 (two) times daily as needed (for heartburn / acid reflux).      QUEtiapine (SEROQUEL) 100 MG tablet Take 25 mg by mouth at bedtime. Pt only takes 1/4 tab     spironolactone (ALDACTONE) 25 MG tablet Take 1 tablet (25 mg total) by mouth daily. (Patient taking differently: Take 25 mg by mouth daily with lunch.) 90 tablet 3   tamsulosin (FLOMAX) 0.4 MG CAPS capsule TAKE 1 CAPSULE BY MOUTH ONCE DAILY AFTER BREAKFAST (Patient taking differently: Take 0.4 mg by mouth daily with lunch.) 90 capsule 0   No current facility-administered medications for this visit.    Allergies:   Terazosin, Enalapril maleate, Lisinopril, Penicillins, and Adhesive [tape]    Social History:  The patient  reports that he quit smoking about 44 years ago. His smoking use included cigarettes. He started smoking about 56 years ago. He has a 60.00 pack-year  smoking history. He has never used smokeless tobacco. He reports previous alcohol use of about 7.0 - 14.0 standard drinks of alcohol per week. He reports that he does not use drugs.   Family History:  The patient's ***family history includes Heart disease in his father and mother; Heart murmur in his sister; Hypertension in his brother, sister, sister, sister, and sister; Liver disease in his brother.    ROS:  General:no colds or fevers, no weight changes Skin:no rashes or ulcers HEENT:no blurred vision, no congestion CV:see HPI PUL:see HPI GI:no diarrhea constipation or melena, no indigestion GU:no hematuria, no dysuria MS:no joint pain, no claudication Neuro:no syncope, no lightheadedness Endo:no diabetes, no thyroid disease Wt Readings from Last 3 Encounters:  06/15/21 185 lb (83.9 kg)  05/26/21 183 lb 12.8 oz (83.4 kg)  03/29/21 180 lb 4.8 oz (81.8 kg)     PHYSICAL EXAM: VS:  There were no vitals taken for this visit. , BMI There is no height or weight on file to calculate BMI. General:Pleasant affect, NAD Skin:Warm and dry, brisk capillary refill HEENT:normocephalic, sclera clear, mucus membranes moist Neck:supple, no JVD, no bruits  Heart:S1S2 RRR without murmur, gallup, rub or click Lungs:clear without rales, rhonchi, or wheezes DVV:OHYW, non tender, + BS, do not palpate liver spleen or masses Ext:no lower ext edema, 2+ pedal pulses, 2+ radial pulses Neuro:alert and oriented, MAE, follows commands, + facial symmetry    EKG:  EKG is ordered today. The ekg ordered today demonstrates ***   Recent Labs: 08/27/2020: Magnesium 2.0 11/18/2020: ALT 32; Platelets 152 03/29/2021: BUN 12; Creatinine, Ser 0.70; Hemoglobin 15.0; Potassium 4.0; Sodium 143    Lipid Panel    Component Value Date/Time   CHOL 77 (L) 11/18/2020 1005   TRIG 37 11/18/2020 1005   TRIG 82 03/25/2015 0950   HDL 32 (L) 11/18/2020 1005   HDL 38 (L) 03/25/2015 0950   CHOLHDL 2.4 11/18/2020 1005    LDLCALC 34 11/18/2020 1005   LDLCALC 54 07/17/2014 0828   LDLDIRECT 74 01/03/2017 1100       Other studies Reviewed: Additional studies/ records that were reviewed today include: ***.  ASSESSMENT AND PLAN:  1.  ***   Current medicines are reviewed with the patient today.  The patient Has no concerns regarding medicines.  The following changes have been made:  See above Labs/ tests ordered today include:see above  Disposition:   FU:  see above  Signed, Cecilie Kicks, NP  06/22/2021 9:38 PM    [facility] [place], [place], [place], [place] Phone: [phone]; Fax: [phone]

## 2021-06-23 ENCOUNTER — Telehealth: Payer: Self-pay | Admitting: Gastroenterology

## 2021-06-23 ENCOUNTER — Ambulatory Visit: Payer: Medicare Other | Admitting: Cardiology

## 2021-06-23 NOTE — Telephone Encounter (Signed)
Patient states he spoke with Cardiology on 06/15/21 but was no mention if he can actually hold Eliquis for procedure. Apologized to patient and informed him to hold it 2 days prior to procedure. Patient verbalized understanding.

## 2021-06-23 NOTE — Telephone Encounter (Signed)
Per Cardiology on 06/15/21, patient was informed per protocol by their office that he can hold Eliquis 2 days prior his procedure and to contact their office with any new symptoms.   Left message for patient to return my call to make sure he is holding Eliquis.

## 2021-06-23 NOTE — Telephone Encounter (Signed)
Inbound call from pt requesting a call back asking if he was ok to hold his Eliquis 2 days prior to his procedure. Please advise. Thanks.

## 2021-06-25 ENCOUNTER — Encounter: Payer: Self-pay | Admitting: Gastroenterology

## 2021-06-25 ENCOUNTER — Other Ambulatory Visit: Payer: Self-pay

## 2021-06-25 ENCOUNTER — Ambulatory Visit (AMBULATORY_SURGERY_CENTER): Payer: Medicare Other | Admitting: Gastroenterology

## 2021-06-25 VITALS — BP 111/66 | HR 63 | Temp 97.3°F | Resp 15 | Ht 71.0 in | Wt 185.0 lb

## 2021-06-25 DIAGNOSIS — Z1212 Encounter for screening for malignant neoplasm of rectum: Secondary | ICD-10-CM

## 2021-06-25 DIAGNOSIS — D123 Benign neoplasm of transverse colon: Secondary | ICD-10-CM | POA: Diagnosis not present

## 2021-06-25 DIAGNOSIS — Z1211 Encounter for screening for malignant neoplasm of colon: Secondary | ICD-10-CM

## 2021-06-25 MED ORDER — SODIUM CHLORIDE 0.9 % IV SOLN: 500.0000 mL | Freq: Once | INTRAVENOUS | Status: DC

## 2021-06-25 NOTE — Patient Instructions (Signed)
You may resume your Eliquis in two days.  Read all of the handouts given to you by your recovery room nurse.  YOU HAD AN ENDOSCOPIC PROCEDURE TODAY AT [facility]:   Refer to the procedure report that was given to you for any specific questions about what was found during the examination.  If the procedure report does not answer your questions, please call your gastroenterologist to clarify.  If you requested that your care partner not be given the details of your procedure findings, then the procedure report has been included in a sealed envelope for you to review at your convenience later.  YOU SHOULD EXPECT: Some feelings of bloating in the abdomen. Passage of more gas than usual.  Walking can help get rid of the air that was put into your GI tract during the procedure and reduce the bloating. If you had a lower endoscopy (such as a colonoscopy or flexible sigmoidoscopy) you may notice spotting of blood in your stool or on the toilet paper. If you underwent a bowel prep for your procedure, you may not have a normal bowel movement for a few days.  Please Note:  You might notice some irritation and congestion in your nose or some drainage.  This is from the oxygen used during your procedure.  There is no need for concern and it should clear up in a day or so.  SYMPTOMS TO REPORT IMMEDIATELY:  Following lower endoscopy (colonoscopy or flexible sigmoidoscopy):  Excessive amounts of blood in the stool  Significant tenderness or worsening of abdominal pains  Swelling of the abdomen that is new, acute  Fever of 100F or higher   For urgent or emergent issues, a gastroenterologist can be reached at any hour by calling [phone]. Do not use MyChart messaging for urgent concerns.    DIET:  We do recommend a small meal at first, but then you may proceed to your regular diet.  Drink plenty of fluids but you should avoid alcoholic beverages for 24 hours.  ACTIVITY:  You should plan  to take it easy for the rest of today and you should NOT DRIVE or use heavy machinery until tomorrow (because of the sedation medicines used during the test).    FOLLOW UP: Our staff will call the number listed on your records 48-72 hours following your procedure to check on you and address any questions or concerns that you may have regarding the information given to you following your procedure. If we do not reach you, we will leave a message.  We will attempt to reach you two times.  During this call, we will ask if you have developed any symptoms of COVID 19. If you develop any symptoms (ie: fever, flu-like symptoms, shortness of breath, cough etc.) before then, please call [phone].  If you test positive for Covid 19 in the 2 weeks post procedure, please call and report this information to [place].    If any biopsies were taken you will be contacted by phone or by letter within the next 1-3 weeks.  Please call us at [phone] if you have not heard about the biopsies in 3 weeks.    SIGNATURES/CONFIDENTIALITY: You and/or your care partner have signed paperwork which will be entered into your electronic medical record.  These signatures attest to the fact that that the information above on your After Visit Summary has been reviewed and is understood.  Full responsibility of the confidentiality of this discharge information lies with  you and/or your care-partner.

## 2021-06-25 NOTE — Progress Notes (Signed)
Pt's states no medical or surgical changes since previsit or office visit. 

## 2021-06-25 NOTE — Progress Notes (Signed)
Called to room to assist during endoscopic procedure.  Patient ID and intended procedure confirmed with present staff. Received instructions for my participation in the procedure from the performing physician.  

## 2021-06-25 NOTE — Progress Notes (Signed)
C.W. vital signs. 

## 2021-06-25 NOTE — Op Note (Signed)
[facility] Patient Name: Joseph Larsen Procedure Date: 06/25/2021 8:34 AM MRN: [id number] Endoscopist: Ladene Artist , MD Age: 75 Referring MD:  Date of Birth: 10/30/1946 Gender: Male Account #: [id number] Procedure:                Colonoscopy Indications:              Screening for colorectal malignant neoplasm Medicines:                Monitored Anesthesia Care Procedure:                Pre-Anesthesia Assessment:                           - Prior to the procedure, a History and Physical                            was performed, and patient medications and                            allergies were reviewed. The patient's tolerance of                            previous anesthesia was also reviewed. The risks                            and benefits of the procedure and the sedation                            options and risks were discussed with the patient.                            All questions were answered, and informed consent                            was obtained. Prior Anticoagulants: The patient has                            taken Eliquis (apixaban), last dose was 2 days                            prior to procedure. ASA Grade Assessment: III - A                            patient with severe systemic disease. After                            reviewing the risks and benefits, the patient was                            deemed in satisfactory condition to undergo the                            procedure.  After obtaining informed consent, the colonoscope                            was passed under direct vision. Throughout the                            procedure, the patient's blood pressure, pulse, and                            oxygen saturations were monitored continuously. The                            PCF-HQ190L Colonoscope was introduced through the                            anus and advanced to the the cecum, identified by                             appendiceal orifice and ileocecal valve. The                            ileocecal valve, appendiceal orifice, and rectum                            were photographed. The quality of the bowel                            preparation was excellent. The colonoscopy was                            performed without difficulty. The patient tolerated                            the procedure well. Scope In: 8:39:00 AM Scope Out: 0:35:46 AM Scope Withdrawal Time: 0 hours 13 minutes 26 seconds  Total Procedure Duration: 0 hours 17 minutes 19 seconds  Findings:                 The perianal and digital rectal examinations were                            normal.                           A 4 mm polyp was found in the proximal transverse                            colon. The polyp was sessile. The polyp was removed                            with a cold biopsy forceps. Resection and retrieval                            were complete.  Two sessile polyps were found in the mid transverse                            colon and distal transverse colon. The polyps were                            6 mm in size. These polyps were removed with a cold                            snare. Resection and retrieval were complete.                           A few medium-mouthed diverticula were found in the                            right colon. There was no evidence of diverticular                            bleeding.                           Multiple medium-mouthed diverticula were found in                            the left colon. There was no evidence of                            diverticular bleeding.                           Internal hemorrhoids were found during                            retroflexion. The hemorrhoids were small and Grade                            I (internal hemorrhoids that do not prolapse).                           The exam was otherwise  without abnormality on                            direct and retroflexion views. Complications:            No immediate complications. Estimated blood loss:                            None. Estimated Blood Loss:     Estimated blood loss: none. Impression:               - One 4 mm polyp in the proximal transverse colon,                            removed with a cold biopsy forceps. Resected and  retrieved.                           - Two 6 mm polyps in the mid transverse colon and                            in the distal transverse colon, removed with a cold                            snare. Resected and retrieved.                           - Mild diverticulosis in the right colon.                           - Moderate diverticulosis in the left colon.                           - Internal hemorrhoids.                           - The examination was otherwise normal on direct                            and retroflexion views. Recommendation:           - Repeat colonoscopy after studies are complete for                            surveillance based on pathology results.                           - Resume Eliquis (apixaban) in 2 days at prior                            dose. Refer to managing physician for further                            adjustment of therapy.                           - Patient has a contact number available for                            emergencies. The signs and symptoms of potential                            delayed complications were discussed with the                            patient. Return to normal activities tomorrow.                            Written discharge instructions were provided to the  patient.                           - High fiber diet.                           - Continue present medications.                           - Await pathology results. Ladene Artist, MD 06/25/2021 9:00:42 AM This  report has been signed electronically.

## 2021-06-25 NOTE — Progress Notes (Signed)
PT taken to PACU. Monitors in place. VSS. Report given to RN. 

## 2021-06-29 ENCOUNTER — Telehealth: Payer: Self-pay | Admitting: *Deleted

## 2021-06-29 NOTE — Telephone Encounter (Signed)
  Follow up Call-  Call back number 06/25/2021  Post procedure Call Back phone  # [phone]  Permission to leave phone message Yes  Some recent data might be hidden     Patient questions:  Do you have a fever, pain , or abdominal swelling? No. Pain Score  0 *  Have you tolerated food without any problems? Yes.    Have you been able to return to your normal activities? Yes.    Do you have any questions about your discharge instructions: Diet   No. Medications  No. Follow up visit  No.  Do you have questions or concerns about your Care? No.  Actions: * If pain score is 4 or above: No action needed, pain <4.  Have you developed a fever since your procedure? no  2.   Have you had an respiratory symptoms (SOB or cough) since your procedure? no  3.   Have you tested positive for COVID 19 since your procedure no  4.   Have you had any family members/close contacts diagnosed with the COVID 19 since your procedure?  no   If yes to any of these questions please route to Joylene John, RN and Joella Prince, RN

## 2021-07-07 ENCOUNTER — Encounter: Payer: Self-pay | Admitting: Gastroenterology

## 2021-07-21 ENCOUNTER — Other Ambulatory Visit: Payer: Self-pay

## 2021-07-21 ENCOUNTER — Other Ambulatory Visit: Payer: Medicare Other | Admitting: *Deleted

## 2021-07-21 DIAGNOSIS — I77819 Aortic ectasia, unspecified site: Secondary | ICD-10-CM

## 2021-07-21 DIAGNOSIS — M79641 Pain in right hand: Secondary | ICD-10-CM | POA: Diagnosis not present

## 2021-07-21 DIAGNOSIS — M79642 Pain in left hand: Secondary | ICD-10-CM | POA: Diagnosis not present

## 2021-07-21 LAB — BASIC METABOLIC PANEL
BUN/Creatinine Ratio: 14 (ref 10–24)
BUN: 11 mg/dL (ref 8–27)
CO2: 23 mmol/L (ref 20–29)
Calcium: 8.9 mg/dL (ref 8.6–10.2)
Chloride: 103 mmol/L (ref 96–106)
Creatinine, Ser: 0.8 mg/dL (ref 0.76–1.27)
Glucose: 82 mg/dL (ref 65–99)
Potassium: 4.1 mmol/L (ref 3.5–5.2)
Sodium: 141 mmol/L (ref 134–144)
eGFR: 93 mL/min/{1.73_m2} (ref >59)

## 2021-07-21 NOTE — Progress Notes (Signed)
BMP pre CT.

## 2021-07-26 ENCOUNTER — Ambulatory Visit (HOSPITAL_BASED_OUTPATIENT_CLINIC_OR_DEPARTMENT_OTHER)
Admission: RE | Admit: 2021-07-26 | Discharge: 2021-07-26 | Disposition: A | Discharge status: Home / Self Care | Payer: Medicare Other | Source: Ambulatory Visit | Attending: Cardiovascular Disease | Admitting: Cardiovascular Disease

## 2021-07-26 ENCOUNTER — Other Ambulatory Visit: Payer: Self-pay

## 2021-07-26 DIAGNOSIS — I77810 Thoracic aortic ectasia: Secondary | ICD-10-CM | POA: Diagnosis not present

## 2021-07-26 DIAGNOSIS — I77819 Aortic ectasia, unspecified site: Secondary | ICD-10-CM | POA: Insufficient documentation

## 2021-07-26 MED ORDER — IOHEXOL 350 MG/ML SOLN
75.0000 mL | Freq: Once | INTRAVENOUS | Status: AC | PRN
  Administered 2021-07-26: 75 mL via INTRAVENOUS

## 2021-08-10 ENCOUNTER — Ambulatory Visit (INDEPENDENT_AMBULATORY_CARE_PROVIDER_SITE_OTHER): Payer: Medicare Other | Admitting: Physician Assistant

## 2021-08-10 ENCOUNTER — Other Ambulatory Visit: Payer: Self-pay | Admitting: *Deleted

## 2021-08-10 ENCOUNTER — Encounter: Payer: Self-pay | Admitting: Physician Assistant

## 2021-08-10 ENCOUNTER — Other Ambulatory Visit: Payer: Self-pay

## 2021-08-10 VITALS — BP 100/60 | HR 77 | Ht 71.0 in | Wt 186.4 lb

## 2021-08-10 DIAGNOSIS — I712 Thoracic aortic aneurysm, without rupture, unspecified: Secondary | ICD-10-CM

## 2021-08-10 DIAGNOSIS — I2510 Atherosclerotic heart disease of native coronary artery without angina pectoris: Secondary | ICD-10-CM | POA: Diagnosis not present

## 2021-08-10 DIAGNOSIS — E7800 Pure hypercholesterolemia, unspecified: Secondary | ICD-10-CM

## 2021-08-10 DIAGNOSIS — I10 Essential (primary) hypertension: Secondary | ICD-10-CM | POA: Diagnosis not present

## 2021-08-10 DIAGNOSIS — I480 Paroxysmal atrial fibrillation: Secondary | ICD-10-CM | POA: Diagnosis not present

## 2021-08-10 MED ORDER — ATENOLOL 25 MG PO TABS: 12.5000 mg | ORAL_TABLET | Freq: Every day | ORAL | 3 refills | Status: AC

## 2021-08-10 NOTE — Progress Notes (Signed)
Cardiology Office Note:    Date:  08/10/2021   ID:  [name], DOB 04/10/1946, MRN [id number]  PCP:  [name], DO   [facility] Providers Cardiologist:  Jenkins Rouge, MD      Referring MD: [name], DO   Chief Complaint:  F/u for CAD, AFib    Patient Profile:    Joseph Larsen is a 75 y.o. 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 Cyndia Bent; L-LAD, S-Dx, S-RI, S-OM) Paroxysmal >> persistent atrial fibrillation S/p DCCV 05/15/20 >> ERAF Flecainide started 6/21>>DCd 2/2 coronary artery disease  ETT-Myoview - no ischemia; no VT DCCV 06/01/20 >> NSR S/p bi-atrial MAZE and LAA clipping 9/21 Hypertension Hyperlipidemia Alcohol abuse Echo 6/19: EF 60-65 Echo 5/21: EF 55-60, LAE, LVH Carotid artery Dz  [place] 9/21: bilat ICA 1-39  Dilated Aorta aortic root on echocardiogram 12/21 (43 mm) CT 8/22: 3.8 cm     Prior CV studies: Chest CTA 07/26/2021 IMPRESSION: The ascending thoracic aorta measures up to 3.8 cm, with the proximal descending thoracic aorta measuring up to 3.5 cm. No findings to suggest aortic stenosis. Recommend annual imaging followup by CTA or MRA. This recommendation follows 2010 ACCF/AHA/AATS/ACR/ASA/SCA/SCAI/SIR/STS/SVM Guidelines for the Diagnosis and Management of Patients with Thoracic Aortic Disease. Circulation.2010; [id number]. Aortic aneurysm NOS (ICD10-I71.9)   Echocardiogram 12/07/2020 EF 60-65, no RWMA, moderate asymmetric LVH, normal diastolic parameters, normal RVSF, RVSP 26, mild MR, mild AV sclerosis without stenosis, dilated aortic root (43 mm)  Carotid US 08/20/20 B/L ICA 1-39   Cardiac catheterization 08/07/20 LM mid 50 LAD prox 90 RI 90 LCx ost 70, mid 50 RCA prox 30 EF 55-65   Coronary CTA 07/21/20 Ca score 3159 (95th percentile) 1. Left Main:  No significant stenosis. FFR = 0.89 2. LAD: Significant stenosis. FFR 0.76 at takeoff of first diagonal, Mid FFR = 0.66, Distal FFR = 0.58 3. LCX: No  significant stenosis. Proximal FFR = 0.86 4. RCA: Unable to be interpreted due to artifact. IMPRESSION: 1. CT FFR analysis showed significant stenosis in the LAD as noted, beginning near the takeoff of the first significant diagonal and extending distally.   ETT-Myoview 05/29/2020 Small inferior apical wall infarct, no ischemia, not gated due to atrial fibrillation, low risk ETT portion normal   Event monitor 05/04/2020 Atrial fibrillation, average heart rate 81, 3.1-second pause, <1% PVCs   Echocardiogram 04/23/2020 EF 55-60, no RWMA, mild LVH, normal RV SF, severe LAE, mildly dilated aortic root (39 mm), dilated ascending aorta (40 mm)    History of Present Illness: Joseph Larsen was last seen by Dr. Johnsie Cancel in 1/22.  Recent follow-up CT scan demonstrated 3.8 cm ascending thoracic aortic aneurysm.  He returns for cardiology follow-up.  He is here alone.  He has been doing well.  He has continued to have left-sided chest discomfort since his bypass surgery.  However, this has gradually gotten better over the past year.  He has not had significant shortness of breath.  He has not had syncope, significant leg edema.    Past Medical History:  Diagnosis Date   Allergic rhinitis    Anxiety    Arthritis    BPH (benign prostatic hyperplasia)    Carotid artery disease ([place])    [place] 9/21: bilat ICA 1-39    Coronary artery disease cardiologist--- dr Johnsie Cancel   nuclear stress test showed low risk no ischemia 05-29-2020;  coronary CTA 07-21-2020 cal score 3159 with disease;  cath 08-07-2020 severe multiple  cad;  08-26-2020 S/p CABG x4  and MAZE procedure  LIMA -LAD, SVG -Dx, SVG -RI, SVG -OM)   Depression    Diverticular disease    Dyspnea on exertion    Echocardiogram    Echocardiogram 12/21: EF 60-65, no RWMA, mod asymmetric basal-septal LVH, normal diastolic fn, normal RVSF, RVSP 26, mild MR, mild AV sclerosis (no AS), dilated aortic root (43 mm), dilated ascending aorta (43 mm), no effusion    ETOH  abuse    First degree heart block    GERD (gastroesophageal reflux disease)    Gout    per pt last episode 2019   Hemorrhoids    Hiatal hernia    History of 2019 novel coronavirus disease (COVID-19)    11-27-2019 positive result in care everywhere, per pt mild symptoms that resolved   HTN (hypertension)    Hyperlipidemia 09/10/2020   Low testosterone    PAF (paroxysmal atrial fibrillation) [facility]) cardiologist--- dr Johnsie Cancel--- first dx 08/ 2019   failed DCCV in 6/21 x2   // Flecainide Rx >>DC'd 2/2 CAD // s/p Maze + LAA clipping in 9/21 // Apixaban   Palpitations    PTSD (post-traumatic stress disorder)    [name] - [employer]   Right inguinal hernia    S/P Maze operation for atrial fibrillation 08/26/2020   at same time cabg    Current Medications: Current Meds  Medication Sig   acetaminophen (TYLENOL) 500 MG tablet Take 1,000 mg by mouth daily as needed for moderate pain or headache.   ALPRAZolam (XANAX) 0.5 MG tablet Take 0.25 mg by mouth at bedtime.    apixaban (ELIQUIS) 5 MG TABS tablet Take 1 tablet (5 mg total) by mouth 2 (two) times daily.   atorvastatin (LIPITOR) 40 MG tablet Take 1 tablet (40 mg total) by mouth daily.   Cholecalciferol (VITAMIN D) 50 MCG (2000 UT) tablet Take 2,000 Units by mouth daily.   Cyanocobalamin (B-12 PO) Take 2 tablets by mouth 2 (two) times daily.   fluticasone (FLONASE) 50 MCG/ACT nasal spray Place 2 sprays into both nostrils 2 (two) times daily as needed for allergies or rhinitis.   isosorbide mononitrate (IMDUR) 30 MG 24 hr tablet Take 1 tablet (30 mg total) by mouth daily.   losartan (COZAAR) 100 MG tablet Take 1 tablet (100 mg total) by mouth daily.   nitroGLYCERIN (NITROSTAT) 0.4 MG SL tablet nitroglycerin 0.4 mg sublingual tablet  DISSOLVE ONE TABLET UNDER THE TONGUE EVERY 5 MINUTES AS NEEDED FOR CHEST PAIN. DO NOT EXCEED A TOTAL OF 3 DOSES IN 15 MINUTES   omeprazole (PRILOSEC) 20 MG capsule Take 20 mg by mouth 2 (two) times daily as needed  (for heartburn / acid reflux).    QUEtiapine (SEROQUEL) 100 MG tablet Take 25 mg by mouth at bedtime. Pt only takes 1/4 tab   spironolactone (ALDACTONE) 25 MG tablet Take 1 tablet (25 mg total) by mouth daily.   tamsulosin (FLOMAX) 0.4 MG CAPS capsule TAKE 1 CAPSULE BY MOUTH ONCE DAILY AFTER BREAKFAST   [DISCONTINUED] aspirin EC 81 MG EC tablet Take 1 tablet (81 mg total) by mouth daily. Swallow whole.   [DISCONTINUED] atenolol (TENORMIN) 25 MG tablet Take 25 mg by mouth daily.     Allergies:   Terazosin, Enalapril maleate, Lisinopril, Penicillins, and Adhesive [tape]   Social History   Tobacco Use   Smoking status: Former    Packs/day: 3.00    Years: 20.00    Pack years: 60.00    Types:  Cigarettes    Start date: 02/09/1965    Quit date: 05/12/1977    Years since quitting: 44.2   Smokeless tobacco: Never  Vaping Use   Vaping Use: Never used  Substance Use Topics   Alcohol use: Not Currently    Alcohol/week: 7.0 - 14.0 standard drinks    Types: 7 - 14 Cans of beer per week    Comment: 03-23-2021 per pt 1 to 2 beers daily (hx alcohol abuse   Drug use: Never     Family Hx: The patient's family history includes Heart disease in his father and mother; Heart murmur in his sister; Hypertension in his brother, sister, sister, sister, and sister; Liver disease in his brother. There is no history of Colon cancer.  Review of Systems  Hematologic/Lymphatic: Bruises/bleeds easily.  Gastrointestinal:  Negative for hematochezia and melena.  Genitourinary:  Negative for hematuria.    EKGs/Labs/Other Test Reviewed:    EKG:  EKG is not ordered today.  The ekg ordered today demonstrates N/A  Recent Labs: 08/27/2020: Magnesium 2.0 11/18/2020: ALT 32; Platelets 152 03/29/2021: Hemoglobin 15.0 07/21/2021: BUN 11; Creatinine, Ser 0.80; Potassium 4.1; Sodium 141   Recent Lipid Panel Lab Results  Component Value Date/Time   CHOL 77 (L) 11/18/2020 10:05 AM   TRIG 37 11/18/2020 10:05 AM   TRIG 82  03/25/2015 09:50 AM   HDL 32 (L) 11/18/2020 10:05 AM   HDL 38 (L) 03/25/2015 09:50 AM   LDLCALC 34 11/18/2020 10:05 AM   LDLCALC 54 07/17/2014 08:28 AM   LDLDIRECT 74 01/03/2017 11:00 AM      Risk Assessment/Calculations:          Physical Exam:    VS:  BP 100/60   Pulse 77   Ht '5\' 11"'$  (1.803 m)   Wt 186 lb 6.4 oz (84.6 kg)   SpO2 96%   BMI 26.00 kg/m     Wt Readings from Last 3 Encounters:  08/10/21 186 lb 6.4 oz (84.6 kg)  06/25/21 185 lb (83.9 kg)  06/15/21 185 lb (83.9 kg)     Constitutional:      Appearance: Healthy appearance. Not in distress.  Neck:     Vascular: JVD normal.  Pulmonary:     Effort: Pulmonary effort is normal.     Breath sounds: No wheezing. No rales.  Cardiovascular:     Normal rate. Regular rhythm. Normal S1. Normal S2.      Murmurs: There is no murmur.  Edema:    Peripheral edema absent.  Abdominal:     Palpations: Abdomen is soft.  Skin:    General: Skin is warm and dry.  Neurological:     General: No focal deficit present.     Mental Status: Alert and oriented to person, place and time.     Cranial Nerves: Cranial nerves are intact.        ASSESSMENT & PLAN:    1. Coronary artery disease involving native coronary artery of native heart without angina pectoris S/p CABG in 9/21.  He is doing well without anginal symptoms.  He has had some residual chest discomfort since her surgery but this is gotten better over time.  He remains on chronic anticoagulation with Apixaban.  He is a year out from his bypass surgery.  He does not really need to continue on aspirin any longer.  Discontinue aspirin 81 mg.  Continue atenolol, atorvastatin 40 mg daily, isosorbide mononitrate 30 mg daily, losartan 100 mg daily, spironolactone 25 mg daily.  2. PAF (paroxysmal atrial fibrillation) (HCC) Maintaining sinus rhythm.  He is status post biatrial maze and left atrial appendage clipping in September 2021 at the time of his bypass surgery.  He is  tolerating anticoagulation well.  He is hemoglobin in April was normal and creatinine was normal in August.  Continue apixaban 5 mg twice daily.  As noted, aspirin will be discontinued.    3. Thoracic aortic aneurysm without rupture (HCC) 3.8 cm by recent CT scan.  He will need a follow-up CT arranged in 1 year.  4. Essential hypertension Blood pressure is well controlled.  He checks his blood pressure and heart rate every morning.  If his heart rate is less than 60 he usually takes a half dose of atenolol.  He takes atenolol 12.5 mg more than he takes 25 mg.  Therefore, I recommend he decrease his atenolol to 12.5 mg daily.  5. Pure hypercholesterolemia LDL optimal on most recent lab work.  Continue atorvastatin 40 mg daily.   Dispo:  Return in about 6 months (around 02/08/2022) for Routine Follow Up with Dr. Johnsie Cancel.   Medication Adjustments/Labs and Tests Ordered: Current medicines are reviewed at length with the patient today.  Concerns regarding medicines are outlined above.  Tests Ordered: No orders of the defined types were placed in this encounter.  Medication Changes: No orders of the defined types were placed in this encounter.   Signed, Richardson Dopp, PA-C  08/10/2021 4:11 PM    [facility] [address], [place], [place]  [address] Phone: [phone]; Fax: [phone]

## 2021-08-10 NOTE — Patient Instructions (Signed)
Medication Instructions:   DECREASE ATENOLOL one half tablet by mouth ( 12.5 mg ) daily. DISCONTINUE Asprin  *If you need a refill on your cardiac medications before your next appointment, please call your pharmacy*   Lab Work:  -NONE  If you have labs (blood work) drawn today and your tests are completely normal, you will receive your results only by: Fernando Salinas (if you have MyChart) OR A paper copy in the mail If you have any lab test that is abnormal or we need to change your treatment, we will call you to review the results.   Testing/Procedures:  -NONE  Follow-Up: At [facility], you and your health needs are our priority.  As part of our continuing mission to provide you with exceptional heart care, we have created designated Provider Care Teams.  These Care Teams include your primary Cardiologist (physician) and Advanced Practice Providers (APPs -  Physician Assistants and Nurse Practitioners) who all work together to provide you with the care you need, when you need it.  We recommend signing up for the patient portal called "MyChart".  Sign up information is provided on this After Visit Summary.  MyChart is used to connect with patients for Virtual Visits (Telemedicine).  Patients are able to view lab/test results, encounter notes, upcoming appointments, etc.  Non-urgent messages can be sent to your provider as well.   To learn more about what you can do with MyChart, go to [email or web address].    Your next appointment:   6 month(s)  The format for your next appointment:   In Person  Provider:   Jenkins Rouge, MD   Other Instructions Non-Cardiac CT Angiography (CTA), is a special type of CT scan that uses a computer to produce multi-dimensional views of major blood vessels throughout the body. In CT angiography, a contrast material is injected through an IV to help visualize the blood vessels. IN 1 YEAR.

## 2021-08-11 ENCOUNTER — Other Ambulatory Visit: Payer: Self-pay

## 2021-08-11 MED ORDER — NITROGLYCERIN 0.4 MG SL SUBL: SUBLINGUAL_TABLET | SUBLINGUAL | 7 refills | Status: DC

## 2021-08-11 NOTE — Telephone Encounter (Signed)
Pt's medication was sent to his pharmacy as requested. Confirmation received.  

## 2021-08-25 ENCOUNTER — Telehealth: Payer: Self-pay | Admitting: Family Medicine

## 2021-08-25 NOTE — Telephone Encounter (Signed)
Left message for patient to call back and schedule Medicare Annual Wellness Visit (AWV) either virtually or in office. Left both office number and my number [phone]  Last AWV 01/09/21  please schedule at anytime with health coach

## 2021-08-31 ENCOUNTER — Ambulatory Visit (INDEPENDENT_AMBULATORY_CARE_PROVIDER_SITE_OTHER): Payer: Medicare Other

## 2021-08-31 VITALS — Ht 71.0 in | Wt 182.0 lb

## 2021-08-31 DIAGNOSIS — Z0000 Encounter for general adult medical examination without abnormal findings: Secondary | ICD-10-CM

## 2021-08-31 NOTE — Patient Instructions (Signed)
[name] , Thank you for taking time to come for your Medicare Wellness Visit. I appreciate your ongoing commitment to your health goals. Please review the following plan we discussed and let me know if I can assist you in the future.   Screening recommendations/referrals: Colonoscopy: Done 06/25/2021 - repeat in 3 years Recommended yearly ophthalmology/optometry visit for glaucoma screening and checkup Recommended yearly dental visit for hygiene and checkup  Vaccinations: Influenza vaccine: Done 10/14/2020 - Repeat annually  Pneumococcal vaccine: Done 03/03/2014, 06/01/2012 & 08/17/2021 Tdap vaccine: Done 04/11/2009 - if since then at [facility], we need a date please.  recommended repeat is every 10 years. Shingles vaccine: Zostavax done 2013, Shingrix #1 11/14/2018, due for shingrix #2   Covid-19: Done 01/17/20, 02/14/20, & 10/07/20  Advanced directives: Please bring a copy of your health care power of attorney and living will to the office to be added to your chart at your convenience.   Conditions/risks identified: Aim for 30 minutes of exercise or brisk walking each day, drink 6-8 glasses of water and eat lots of fruits and vegetables.   Next appointment: Follow up in one year for your annual wellness visit.   Preventive Care 74 Years and Older, Male  Preventive care refers to lifestyle choices and visits with your health care provider that can promote health and wellness. What does preventive care include? A yearly physical exam. This is also called an annual well check. Dental exams once or twice a year. Routine eye exams. Ask your health care provider how often you should have your eyes checked. Personal lifestyle choices, including: Daily care of your teeth and gums. Regular physical activity. Eating a healthy diet. Avoiding tobacco and drug use. Limiting alcohol use. Practicing safe sex. Taking low doses of aspirin every day. Taking vitamin and mineral supplements as recommended by your  health care provider. What happens during an annual well check? The services and screenings done by your health care provider during your annual well check will depend on your age, overall health, lifestyle risk factors, and family history of disease. Counseling  Your health care provider may ask you questions about your: Alcohol use. Tobacco use. Drug use. Emotional well-being. Home and relationship well-being. Sexual activity. Eating habits. History of falls. Memory and ability to understand (cognition). Work and work Statistician. Screening  You may have the following tests or measurements: Height, weight, and BMI. Blood pressure. Lipid and cholesterol levels. These may be checked every 5 years, or more frequently if you are over 74 years old. Skin check. Lung cancer screening. You may have this screening every year starting at age 74 if you have a 30-pack-year history of smoking and currently smoke or have quit within the past 15 years. Fecal occult blood test (FOBT) of the stool. You may have this test every year starting at age 74. Flexible sigmoidoscopy or colonoscopy. You may have a sigmoidoscopy every 5 years or a colonoscopy every 10 years starting at age 74. Prostate cancer screening. Recommendations will vary depending on your family history and other risks. Hepatitis C blood test. Hepatitis B blood test. Sexually transmitted disease (STD) testing. Diabetes screening. This is done by checking your blood sugar (glucose) after you have not eaten for a while (fasting). You may have this done every 1-3 years. Abdominal aortic aneurysm (AAA) screening. You may need this if you are a current or former smoker. Osteoporosis. You may be screened starting at age 74 if you are at high risk. Talk with your  health care provider about your test results, treatment options, and if necessary, the need for more tests. Vaccines  Your health care provider may recommend certain vaccines, such  as: Influenza vaccine. This is recommended every year. Tetanus, diphtheria, and acellular pertussis (Tdap, Td) vaccine. You may need a Td booster every 10 years. Zoster vaccine. You may need this after age 74. Pneumococcal 13-valent conjugate (PCV13) vaccine. One dose is recommended after age 74. Pneumococcal polysaccharide (PPSV23) vaccine. One dose is recommended after age 74. Talk to your health care provider about which screenings and vaccines you need and how often you need them. This information is not intended to replace advice given to you by your health care provider. Make sure you discuss any questions you have with your health care provider. Document Released: 12/25/2015 Document Revised: 08/17/2016 Document Reviewed: 09/29/2015 Elsevier Interactive Patient Education  2017 [employer] Prevention in the Home Falls can cause injuries. They can happen to people of all ages. There are many things you can do to make your home safe and to help prevent falls. What can I do on the outside of my home? Regularly fix the edges of walkways and driveways and fix any cracks. Remove anything that might make you trip as you walk through a door, such as a raised step or threshold. Trim any bushes or trees on the path to your home. Use bright outdoor lighting. Clear any walking paths of anything that might make someone trip, such as rocks or tools. Regularly check to see if handrails are loose or broken. Make sure that both sides of any steps have handrails. Any raised decks and porches should have guardrails on the edges. Have any leaves, snow, or ice cleared regularly. Use sand or salt on walking paths during winter. Clean up any spills in your garage right away. This includes oil or grease spills. What can I do in the bathroom? Use night lights. Install grab bars by the toilet and in the tub and shower. Do not use towel bars as grab bars. Use non-skid mats or decals in the tub or  shower. If you need to sit down in the shower, use a plastic, non-slip stool. Keep the floor dry. Clean up any water that spills on the floor as soon as it happens. Remove soap buildup in the tub or shower regularly. Attach bath mats securely with double-sided non-slip rug tape. Do not have throw rugs and other things on the floor that can make you trip. What can I do in the bedroom? Use night lights. Make sure that you have a light by your bed that is easy to reach. Do not use any sheets or blankets that are too big for your bed. They should not hang down onto the floor. Have a firm chair that has side arms. You can use this for support while you get dressed. Do not have throw rugs and other things on the floor that can make you trip. What can I do in the kitchen? Clean up any spills right away. Avoid walking on wet floors. Keep items that you use a lot in easy-to-reach places. If you need to reach something above you, use a strong step stool that has a grab bar. Keep electrical cords out of the way. Do not use floor polish or wax that makes floors slippery. If you must use wax, use non-skid floor wax. Do not have throw rugs and other things on the floor that can make you trip. What  can I do with my stairs? Do not leave any items on the stairs. Make sure that there are handrails on both sides of the stairs and use them. Fix handrails that are broken or loose. Make sure that handrails are as long as the stairways. Check any carpeting to make sure that it is firmly attached to the stairs. Fix any carpet that is loose or worn. Avoid having throw rugs at the top or bottom of the stairs. If you do have throw rugs, attach them to the floor with carpet tape. Make sure that you have a light switch at the top of the stairs and the bottom of the stairs. If you do not have them, ask someone to add them for you. What else can I do to help prevent falls? Wear shoes that: Do not have high heels. Have  rubber bottoms. Are comfortable and fit you well. Are closed at the toe. Do not wear sandals. If you use a stepladder: Make sure that it is fully opened. Do not climb a closed stepladder. Make sure that both sides of the stepladder are locked into place. Ask someone to hold it for you, if possible. Clearly mark and make sure that you can see: Any grab bars or handrails. First and last steps. Where the edge of each step is. Use tools that help you move around (mobility aids) if they are needed. These include: Canes. Walkers. Scooters. Crutches. Turn on the lights when you go into a dark area. Replace any light bulbs as soon as they burn out. Set up your furniture so you have a clear path. Avoid moving your furniture around. If any of your floors are uneven, fix them. If there are any pets around you, be aware of where they are. Review your medicines with your doctor. Some medicines can make you feel dizzy. This can increase your chance of falling. Ask your doctor what other things that you can do to help prevent falls. This information is not intended to replace advice given to you by your health care provider. Make sure you discuss any questions you have with your health care provider. Document Released: 09/24/2009 Document Revised: 05/05/2016 Document Reviewed: 01/02/2015 Elsevier Interactive Patient Education  2017 [employer].

## 2021-08-31 NOTE — Progress Notes (Signed)
Subjective:   Joseph Larsen is a 75 y.o. male who presents for Medicare Annual/Subsequent preventive examination.  Virtual Visit via Telephone Note  I connected with  Joseph Larsen on 08/31/21 at  2:45 PM EDT by telephone and verified that I am speaking with the correct person using two identifiers.  Location: Patient: Home Provider: [facility] Persons participating in the virtual visit: patient/Nurse Health Advisor   I discussed the limitations, risks, security and privacy concerns of performing an evaluation and management service by telephone and the availability of in person appointments. The patient expressed understanding and agreed to proceed.  Interactive audio and video telecommunications were attempted between this nurse and patient, however failed, due to patient having technical difficulties OR patient did not have access to video capability.  We continued and completed visit with audio only.  Some vital signs may be absent or patient reported.   Kynzlee Hucker E Hal Norrington, LPN   Review of Systems     Cardiac Risk Factors include: advanced age (>70men, >43 women);male gender;dyslipidemia;hypertension     Objective:    Today's Vitals   08/31/21 1441  Weight: 182 lb (82.6 kg)  Height: 5\' 11"  (1.803 m)   Body mass index is 25.38 kg/m.  Advanced Directives 08/31/2021 03/29/2021 08/26/2020 08/20/2020 08/07/2020 06/01/2020 01/21/2020  Does Patient Have a Medical Advance Directive? Yes Yes Yes - Yes Yes Yes  Type of Advance Directive [place];Living will [place];Living will [place];Living will [place];Living will Healthcare Power of Attorney Living will;Healthcare Power of [place];Living will  Does patient want to make changes to medical advance directive? - No - Patient declined No - Patient declined - - - -  Copy of [place] in Chart? No - copy requested - No - copy  requested No - copy requested No - copy requested No - copy requested -  Would patient like information on creating a medical advance directive? - - - - - - -    Current Medications (verified) Outpatient Encounter Medications as of 08/31/2021  Medication Sig   acetaminophen (TYLENOL) 500 MG tablet Take 1,000 mg by mouth daily as needed for moderate pain or headache.   ALPRAZolam (XANAX) 0.5 MG tablet Take 0.25 mg by mouth at bedtime.    apixaban (ELIQUIS) 5 MG TABS tablet Take 1 tablet (5 mg total) by mouth 2 (two) times daily.   atenolol (TENORMIN) 25 MG tablet Take 0.5 tablets (12.5 mg total) by mouth daily.   atorvastatin (LIPITOR) 40 MG tablet Take 1 tablet (40 mg total) by mouth daily.   Cholecalciferol (VITAMIN D) 50 MCG (2000 UT) tablet Take 2,000 Units by mouth daily.   Cyanocobalamin (B-12 PO) Take 2 tablets by mouth 2 (two) times daily.   fluticasone (FLONASE) 50 MCG/ACT nasal spray Place 2 sprays into both nostrils 2 (two) times daily as needed for allergies or rhinitis.   isosorbide mononitrate (IMDUR) 30 MG 24 hr tablet Take 1 tablet (30 mg total) by mouth daily.   losartan (COZAAR) 100 MG tablet Take 1 tablet (100 mg total) by mouth daily.   nitroGLYCERIN (NITROSTAT) 0.4 MG SL tablet nitroglycerin 0.4 mg sublingual tablet DISSOLVE ONE TABLET UNDER THE TONGUE EVERY 5 MINUTES AS NEEDED FOR CHEST PAIN. DO NOT EXCEED A TOTAL OF 3 DOSES IN 15 MINUTES   omeprazole (PRILOSEC) 20 MG capsule Take 20 mg by mouth 2 (two) times daily as needed (for heartburn /  acid reflux).    QUEtiapine (SEROQUEL) 100 MG tablet Take 25 mg by mouth at bedtime. Pt only takes 1/4 tab   spironolactone (ALDACTONE) 25 MG tablet Take 1 tablet (25 mg total) by mouth daily.   tamsulosin (FLOMAX) 0.4 MG CAPS capsule TAKE 1 CAPSULE BY MOUTH ONCE DAILY AFTER BREAKFAST   No facility-administered encounter medications on file as of 08/31/2021.    Allergies (verified) Terazosin, Enalapril maleate, Lisinopril,  Penicillins, and Adhesive [tape]   History: Past Medical History:  Diagnosis Date   Allergic rhinitis    Anxiety    Arthritis    BPH (benign prostatic hyperplasia)    Carotid artery disease ([place])    [place] 9/21: bilat ICA 1-39    Coronary artery disease cardiologist--- dr Johnsie Cancel   nuclear stress test showed low risk no ischemia 05-29-2020;  coronary CTA 07-21-2020 cal score 3159 with disease;  cath 08-07-2020 severe multiple cad;  08-26-2020 S/p CABG x4  and MAZE procedure  LIMA -LAD, SVG -Dx, SVG -RI, SVG -OM)   Depression    Diverticular disease    Dyspnea on exertion    Echocardiogram    Echocardiogram 12/21: EF 60-65, no RWMA, mod asymmetric basal-septal LVH, normal diastolic fn, normal RVSF, RVSP 26, mild MR, mild AV sclerosis (no AS), dilated aortic root (43 mm), dilated ascending aorta (43 mm), no effusion    ETOH abuse    First degree heart block    GERD (gastroesophageal reflux disease)    Gout    per pt last episode 2019   Hemorrhoids    Hiatal hernia    History of 2019 novel coronavirus disease (COVID-19)    11-27-2019 positive result in care everywhere, per pt mild symptoms that resolved   HTN (hypertension)    Hyperlipidemia 09/10/2020   Low testosterone    PAF (paroxysmal atrial fibrillation) [facility]) cardiologist--- dr Johnsie Cancel--- first dx 08/ 2019   failed DCCV in 6/21 x2   // Flecainide Rx >>DC'd 2/2 CAD // s/p Maze + LAA clipping in 9/21 // Apixaban   Palpitations    PTSD (post-traumatic stress disorder)    [name] - [employer]   Right inguinal hernia    S/P Maze operation for atrial fibrillation 08/26/2020   at same time cabg   Past Surgical History:  Procedure Laterality Date   CARDIOVERSION N/A 05/15/2020   Procedure: CARDIOVERSION;  Surgeon: Sueanne Margarita, MD;  Location[facility] ENDOSCOPY;  Service: Cardiovascular;  Laterality: N/A;   CARDIOVERSION N/A 06/01/2020   Procedure: CARDIOVERSION;  Surgeon: Elouise Munroe, MD;  Location: [facility] ENDOSCOPY;  Service:  Cardiovascular;  Laterality: N/A;   CLIPPING OF ATRIAL APPENDAGE N/A 08/26/2020   Procedure: CLIPPING OF ATRIAL APPENDAGE;  Surgeon: Gaye Pollack, MD;  Location: [place];  Service: Open Heart Surgery;  Laterality: N/A;   CORONARY ARTERY BYPASS GRAFT N/A 08/26/2020   Procedure: CORONARY ARTERY BYPASS GRAFTING TIMES FOUR USING LEFT INTERNAL MAMMARY ARTERY AND ENDOSCOPICALLY HARVESTED RIGHT GREATER SAPHENOUS VEIN;  Surgeon: Gaye Pollack, MD;  Location: [place];  Service: Open Heart Surgery;  Laterality: N/A;   ESOPHAGOGASTRODUODENOSCOPY  last one 03-24-2020  @[facility]    INGUINAL HERNIA REPAIR Right 03/29/2021   Procedure: OPEN REPAIR RIGHT INGUINAL HERNIA WITH MESH;  Surgeon: Armandina Gemma, MD;  Location: [place];  Service: General;  Laterality: Right;  [place] ARTHROSCOPY Bilateral 2010;  2012   LAPAROSCOPIC CHOLECYSTECTOMY  08-31-2005  @[facility]    LEFT HEART CATH AND CORONARY ANGIOGRAPHY N/A 08/07/2020  Procedure: LEFT HEART CATH AND CORONARY ANGIOGRAPHY;  Surgeon: Martinique, Peter M, MD;  Location: [place] [facility];  Service: Cardiovascular;  Laterality: N/A;   MAZE N/A 08/26/2020   Procedure: MAZE;  Surgeon: Gaye Pollack, MD;  Location: [facility] OR;  Service: Open Heart Surgery;  Laterality: N/A;   SHOULDER ARTHROSCOPY Right 01/14/2020   TEE WITHOUT CARDIOVERSION N/A 08/26/2020   Procedure: TRANSESOPHAGEAL ECHOCARDIOGRAM (TEE);  Surgeon: Gaye Pollack, MD;  Location: [place];  Service: Open Heart Surgery;  Laterality: N/A;   Family History  Problem Relation Age of Onset   Heart disease Mother        "died of chf"   Heart disease Father        "died of old age"   Hypertension Sister    Liver disease Brother    Hypertension Brother    Hypertension Sister    Hypertension Sister    Hypertension Sister    Heart murmur Sister    Colon cancer Neg Hx    Social History   Socioeconomic History   Marital status: Married    Spouse name: Suanne Marker   Number of children: 1   Years of  education: Not on file   Highest education level: Not on file  Occupational History   Occupation: disabled    Employer: RETIRED    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  Tobacco Use   Smoking status: Former    Packs/day: 3.00    Years: 20.00    Pack years: 60.00    Types: Cigarettes    Start date: 02/09/1965    Quit date: 05/12/1977    Years since quitting: 44.3   Smokeless tobacco: Never  Vaping Use   Vaping Use: Never used  Substance and Sexual Activity   Alcohol use: Not Currently    Alcohol/week: 7.0 - 14.0 standard drinks    Types: 7 - 14 Cans of beer per week    Comment: 03-23-2021 per pt 1 to 2 beers daily (hx alcohol abuse   Drug use: Never   Sexual activity: Yes  Other Topics Concern   Not on file  Social History Narrative   Lives with wife in [place] in a one story home.  Exercises @ gym 3x/wk - stationary bike and light weight training.   Social Determinants of Health   Financial Resource Strain: Low Risk    Difficulty of Paying Living Expenses: Not hard at all  Food Insecurity: No Food Insecurity   Worried About Charity fundraiser in the Last Year: Never true   [place] in the Last Year: Never true  Transportation Needs: No Transportation Needs   Lack of Transportation (Medical): No   Lack of Transportation (Non-Medical): No  Physical Activity: Insufficiently Active   Days of Exercise per Week: 4 days   Minutes of Exercise per Session: 30 min  Stress: No Stress Concern Present   Feeling of Stress : Not at all  Social Connections: Moderately Isolated   Frequency of Communication with Friends and Family: More than three times a week   Frequency of Social Gatherings with Friends and Family: Three times a week   Attends Religious Services: Never   Active Member of Clubs or Organizations: No   Attends Archivist Meetings: Never   Marital Status: Married    Tobacco Counseling Counseling given: Not Answered   Clinical  Intake:  Pre-visit preparation completed: Yes  Pain : No/denies pain     BMI - recorded:  25.38 Nutritional Status: BMI 25 -29 Overweight Nutritional Risks: None Diabetes: No  How often do you need to have someone help you when you read instructions, pamphlets, or other written materials from your doctor or pharmacy?: 1 - Never  Diabetic? no  Interpreter Needed?: No  Information entered by :: Izaiah Tabb, LPN   Activities of Daily Living In your present state of health, do you have any difficulty performing the following activities: 08/31/2021 03/29/2021  Hearing? N N  Vision? N N  Difficulty concentrating or making decisions? N Y  Walking or climbing stairs? N N  Dressing or bathing? N N  Doing errands, shopping? N -  Preparing Food and eating ? N -  Using the Toilet? N -  In the past six months, have you accidently leaked urine? N -  Do you have problems with loss of bowel control? N -  Managing your Medications? N -  Managing your Finances? N -  Housekeeping or managing your Housekeeping? N -  Some recent data might be hidden    Patient Care Team: [name], DO as PCP - General (Family Medicine) Josue Hector, MD as PCP - Cardiology (Cardiology) Ladene Artist, MD as Consulting Physician (Gastroenterology) Nash Dimmer, MD as Consulting Physician (Gastroenterology)  Indicate any recent Medical Services you may have received from other than [facility] providers in the past year (date may be approximate).     Assessment:   This is a routine wellness examination for [place].  Hearing/Vision screen Hearing Screening - Comments:: Denies hearing difficulties  Vision Screening - Comments:: Wears glasses as needed for reading - up to date with annual eye exams at [place] issues and exercise activities discussed: Current Exercise Habits: Home exercise routine, Type of exercise: walking;Other - see comments (yard work), Time (Minutes): 30,  Frequency (Times/Week): 5, Weekly Exercise (Minutes/Week): 150, Intensity: Mild, Exercise limited by: cardiac condition(s)   Goals Addressed             This Visit's Progress    Exercise 3x per week (30 min per time)         Depression Screen PHQ 2/9 Scores 08/31/2021 05/26/2021 01/11/2021 10/14/2020 06/18/2020 04/13/2020 01/07/2019  PHQ - 2 Score 0 0 0 0 0 0 0  PHQ- 9 Score - - - 0 - - 0    Fall Risk Fall Risk  08/31/2021 05/26/2021 01/11/2021 10/14/2020 06/18/2020  Falls in the past year? 0 0 0 0 0  Number falls in past yr: 0 - - - -  Injury with Fall? 0 - - - -  Risk for fall due to : No Fall Risks - No Fall Risks - -  Follow up Falls prevention discussed - Falls evaluation completed - -    FALL RISK PREVENTION PERTAINING TO THE HOME:  Any stairs in or around the home? No  If so, are there any without handrails? No  Home free of loose throw rugs in walkways, pet beds, electrical cords, etc? Yes  Adequate lighting in your home to reduce risk of falls? Yes   ASSISTIVE DEVICES UTILIZED TO PREVENT FALLS:  Life alert? No  Use of a cane, walker or w/c? No  Grab bars in the bathroom? No  Shower chair or bench in shower? No  Elevated toilet seat or a handicapped toilet? No   TIMED UP AND GO:  Was the test performed? No . Telephonic visit  Cognitive Function: Normal cognitive status assessed by direct observation  by this Nurse Health Advisor. No abnormalities found.    MMSE - Mini Mental State Exam 01/09/2018 01/03/2017 01/19/2016  Orientation to time 5 5 4   Orientation to Place 5 5 5   Registration 3 3 3   Attention/ Calculation 5 5 2   Recall 3 3 3   Language- name 2 objects 2 2 2   Language- repeat 1 1 1   Language- follow 3 step command 3 3 3   Language- read & follow direction 1 1 1   Write a sentence 1 1 1   Copy design 0 1 1  Total score 29 30 26         Immunizations Immunization History  Administered Date(s) Administered   Fluad Quad(high Dose 65+) 10/14/2020   Influenza  Split 11/18/2011, 11/11/2012   Influenza,inj,Quad PF,6+ Mos 10/02/2013   Influenza,inj,quad, With Preservative 09/12/2019   Influenza-Unspecified 10/13/2015, 09/09/2016, 09/26/2017, 09/12/2018, 09/17/2019   Moderna Sars-Covid-2 Vaccination 01/17/2020, 02/14/2020, 10/07/2020   PNEUMOCOCCAL CONJUGATE-20 08/17/2021   Pneumococcal Conjugate-13 03/03/2014   Pneumococcal Polysaccharide-23 12/13/2011, 06/01/2012   Tdap 04/11/2009   Zoster Recombinat (Shingrix) 11/14/2018   Zoster, Live 01/13/2012    TDAP status: Up to date  Flu Vaccine status: Up to date  Pneumococcal vaccine status: Up to date  Covid-19 vaccine status: Completed vaccines  Qualifies for Shingles Vaccine? Yes   Zostavax completed Yes   Shingrix Completed?: No.    Education has been provided regarding the importance of this vaccine. Patient has been advised to call insurance company to determine out of pocket expense if they have not yet received this vaccine. Advised may also receive vaccine at local pharmacy or Health Dept. Verbalized acceptance and understanding.  Screening Tests Health Maintenance  Topic Date Due   Zoster Vaccines- Shingrix (2 of 2) 01/09/2019   TETANUS/TDAP  04/12/2019   COVID-19 Vaccine (4 - Booster for Moderna series) 12/30/2020   INFLUENZA VACCINE  07/12/2021   COLONOSCOPY (Pts 45-74yrs Insurance coverage will need to be confirmed)  06/25/2024   Hepatitis C Screening  Completed   HPV VACCINES  Aged Out    Health Maintenance  Health Maintenance Due  Topic Date Due   Zoster Vaccines- Shingrix (2 of 2) 01/09/2019   TETANUS/TDAP  04/12/2019   COVID-19 Vaccine (4 - Booster for Moderna series) 12/30/2020   INFLUENZA VACCINE  07/12/2021    Colorectal cancer screening: Type of screening: Colonoscopy. Completed 06/25/2021. Repeat every 3 years  Lung Cancer Screening: (Low Dose CT Chest recommended if Age 23-80 years, 30 pack-year currently smoking OR have quit w/in 15years.) does not qualify.    Additional Screening:  Hepatitis C Screening: does qualify; Completed 08/09/2010  Vision Screening: Recommended annual ophthalmology exams for early detection of glaucoma and other disorders of the eye. Is the patient up to date with their annual eye exam?  Yes  Who is the provider or what is the name of the office in which the patient attends annual eye exams? [facility] If pt is not established with a provider, would they like to be referred to a provider to establish care? No .   Dental Screening: Recommended annual dental exams for proper oral hygiene  Community Resource Referral / Chronic Care Management: CRR required this visit?  No   CCM required this visit?  No      Plan:     I have personally reviewed and noted the following in the patient's chart:   Medical and social history Use of alcohol, tobacco or illicit drugs  Current medications and supplements including opioid  prescriptions. Patient is not currently taking opioid prescriptions. Functional ability and status Nutritional status Physical activity Advanced directives List of other physicians Hospitalizations, surgeries, and ER visits in previous 12 months Vitals Screenings to include cognitive, depression, and falls Referrals and appointments  In addition, I have reviewed and discussed with patient certain preventive protocols, quality metrics, and best practice recommendations. A written personalized care plan for preventive services as well as general preventive health recommendations were provided to patient.     Sandrea Hammond, LPN   03/28/5300   Nurse Notes: None

## 2021-10-04 ENCOUNTER — Encounter: Payer: Self-pay | Admitting: Family Medicine

## 2021-10-04 ENCOUNTER — Ambulatory Visit (INDEPENDENT_AMBULATORY_CARE_PROVIDER_SITE_OTHER): Payer: Medicare Other | Admitting: Family Medicine

## 2021-10-04 ENCOUNTER — Other Ambulatory Visit: Payer: Self-pay

## 2021-10-04 VITALS — BP 129/80 | HR 67 | Temp 97.8°F | Ht 71.0 in | Wt 190.0 lb

## 2021-10-04 DIAGNOSIS — N6342 Unspecified lump in left breast, subareolar: Secondary | ICD-10-CM | POA: Diagnosis not present

## 2021-10-04 NOTE — Progress Notes (Signed)
Subjective:  Patient ID: Joseph Larsen, male    DOB: [birth year], 75 y.o.   MRN: [id number]  Patient Care Team: [name], DO as PCP - General (Family Medicine) Josue Hector, MD as PCP - Cardiology (Cardiology) Ladene Artist, MD as Consulting Physician (Gastroenterology) Adria Devon Rayne Du, MD as Consulting Physician (Gastroenterology)   Chief Complaint:  breast pain left side, external   HPI: Joseph Larsen is a 75 y.o. male presenting on 10/04/2021 for breast pain left side, external   Pt presents today for evaluation of mass to left breast. States he noticed this a few days ago. States mass is under his areola and is slightly tender to touch. He denies fever, chills, weight changes, decreased appetite, confusion, weakness, night sweats, or lymphadenopathy. No reported injuries or recent illnesses.    Relevant past medical, surgical, family, and social history reviewed and updated as indicated.  Allergies and medications reviewed and updated. Data reviewed: Chart in Epic.   Past Medical History:  Diagnosis Date   Allergic rhinitis    Anxiety    Arthritis    BPH (benign prostatic hyperplasia)    Carotid artery disease ([place])    [place] 9/21: bilat ICA 1-39    Coronary artery disease cardiologist--- dr Johnsie Cancel   nuclear stress test showed low risk no ischemia 05-29-2020;  coronary CTA 07-21-2020 cal score 3159 with disease;  cath 08-07-2020 severe multiple cad;  08-26-2020 S/p CABG x4  and MAZE procedure  LIMA -LAD, SVG -Dx, SVG -RI, SVG -OM)   Depression    Diverticular disease    Dyspnea on exertion    Echocardiogram    Echocardiogram 12/21: EF 60-65, no RWMA, mod asymmetric basal-septal LVH, normal diastolic fn, normal RVSF, RVSP 26, mild MR, mild AV sclerosis (no AS), dilated aortic root (43 mm), dilated ascending aorta (43 mm), no effusion    ETOH abuse    First degree heart block    GERD (gastroesophageal reflux disease)    Gout    per pt last episode 2019    Hemorrhoids    Hiatal hernia    History of 2019 novel coronavirus disease (COVID-19)    11-27-2019 positive result in care everywhere, per pt mild symptoms that resolved   HTN (hypertension)    Hyperlipidemia 09/10/2020   Low testosterone    PAF (paroxysmal atrial fibrillation) [facility]) cardiologist--- dr Johnsie Cancel--- first dx 08/ 2019   failed DCCV in 6/21 x2   // Flecainide Rx >>DC'd 2/2 CAD // s/p Maze + LAA clipping in 9/21 // Apixaban   Palpitations    PTSD (post-traumatic stress disorder)    [name] - [employer]   Right inguinal hernia    S/P Maze operation for atrial fibrillation 08/26/2020   at same time cabg    Past Surgical History:  Procedure Laterality Date   CARDIOVERSION N/A 05/15/2020   Procedure: CARDIOVERSION;  Surgeon: Sueanne Margarita, MD;  Location: [facility] ENDOSCOPY;  Service: Cardiovascular;  Laterality: N/A;   CARDIOVERSION N/A 06/01/2020   Procedure: CARDIOVERSION;  Surgeon: Elouise Munroe, MD;  Location: [facility] ENDOSCOPY;  Service: Cardiovascular;  Laterality: N/A;   CLIPPING OF ATRIAL APPENDAGE N/A 08/26/2020   Procedure: CLIPPING OF ATRIAL APPENDAGE;  Surgeon: Gaye Pollack, MD;  Location: [place];  Service: Open Heart Surgery;  Laterality: N/A;   CORONARY ARTERY BYPASS GRAFT N/A 08/26/2020   Procedure: CORONARY ARTERY BYPASS GRAFTING TIMES FOUR USING LEFT INTERNAL MAMMARY ARTERY AND ENDOSCOPICALLY HARVESTED RIGHT GREATER SAPHENOUS  VEIN;  Surgeon: Gaye Pollack, MD;  Location: [facility] OR;  Service: Open Heart Surgery;  Laterality: N/A;   ESOPHAGOGASTRODUODENOSCOPY  last one 03-24-2020  @[facility]    INGUINAL HERNIA REPAIR Right 03/29/2021   Procedure: OPEN REPAIR RIGHT INGUINAL HERNIA WITH MESH;  Surgeon: Armandina Gemma, MD;  Location: [place];  Service: General;  Laterality: Right;  60 MINUTES   KNEE ARTHROSCOPY Bilateral 2010;  2012   LAPAROSCOPIC CHOLECYSTECTOMY  08-31-2005  @[facility]    LEFT HEART CATH AND CORONARY ANGIOGRAPHY N/A 08/07/2020   Procedure: LEFT HEART CATH  AND CORONARY ANGIOGRAPHY;  Surgeon: Martinique, Peter M, MD;  Location: [place] CV LAB;  Service: Cardiovascular;  Laterality: N/A;   MAZE N/A 08/26/2020   Procedure: MAZE;  Surgeon: Gaye Pollack, MD;  Location: [facility] OR;  Service: Open Heart Surgery;  Laterality: N/A;   SHOULDER ARTHROSCOPY Right 01/14/2020   TEE WITHOUT CARDIOVERSION N/A 08/26/2020   Procedure: TRANSESOPHAGEAL ECHOCARDIOGRAM (TEE);  Surgeon: Gaye Pollack, MD;  Location: [place];  Service: Open Heart Surgery;  Laterality: N/A;    Social History   Socioeconomic History   Marital status: Married    Spouse name: Suanne Marker   Number of children: 1   Years of education: Not on file   Highest education level: Not on file  Occupational History   Occupation: disabled    Employer: RETIRED    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  Tobacco Use   Smoking status: Former    Packs/day: 3.00    Years: 20.00    Pack years: 60.00    Types: Cigarettes    Start date: 02/09/1965    Quit date: 05/12/1977    Years since quitting: 44.4   Smokeless tobacco: Never  Vaping Use   Vaping Use: Never used  Substance and Sexual Activity   Alcohol use: Not Currently    Alcohol/week: 7.0 - 14.0 standard drinks    Types: 7 - 14 Cans of beer per week    Comment: 03-23-2021 per pt 1 to 2 beers daily (hx alcohol abuse   Drug use: Never   Sexual activity: Yes  Other Topics Concern   Not on file  Social History Narrative   Lives with wife in [place] in a one story home.  Exercises @ gym 3x/wk - stationary bike and light weight training.   Social Determinants of Health   Financial Resource Strain: Low Risk    Difficulty of Paying Living Expenses: Not hard at all  Food Insecurity: No Food Insecurity   Worried About Charity fundraiser in the Last Year: Never true   [place] in the Last Year: Never true  Transportation Needs: No Transportation Needs   Lack of Transportation (Medical): No   Lack of Transportation (Non-Medical): No   Physical Activity: Insufficiently Active   Days of Exercise per Week: 4 days   Minutes of Exercise per Session: 30 min  Stress: No Stress Concern Present   Feeling of Stress : Not at all  Social Connections: Moderately Isolated   Frequency of Communication with Friends and Family: More than three times a week   Frequency of Social Gatherings with Friends and Family: Three times a week   Attends Religious Services: Never   Active Member of Clubs or Organizations: No   Attends Archivist Meetings: Never   Marital Status: Married  Human resources officer Violence: Not At Risk   Fear of Current or Ex-Partner: No   Emotionally Abused: No  Physically Abused: No   Sexually Abused: No    Outpatient Encounter Medications as of 10/04/2021  Medication Sig   acetaminophen (TYLENOL) 500 MG tablet Take 1,000 mg by mouth daily as needed for moderate pain or headache.   ALPRAZolam (XANAX) 0.5 MG tablet Take 0.25 mg by mouth at bedtime.    atenolol (TENORMIN) 25 MG tablet Take 0.5 tablets (12.5 mg total) by mouth daily.   Cholecalciferol (VITAMIN D) 50 MCG (2000 UT) tablet Take 2,000 Units by mouth daily.   Cyanocobalamin (B-12 PO) Take 2 tablets by mouth 2 (two) times daily.   fluticasone (FLONASE) 50 MCG/ACT nasal spray Place 2 sprays into both nostrils 2 (two) times daily as needed for allergies or rhinitis.   isosorbide mononitrate (IMDUR) 30 MG 24 hr tablet Take 1 tablet (30 mg total) by mouth daily.   losartan (COZAAR) 100 MG tablet Take 1 tablet (100 mg total) by mouth daily.   nitroGLYCERIN (NITROSTAT) 0.4 MG SL tablet nitroglycerin 0.4 mg sublingual tablet DISSOLVE ONE TABLET UNDER THE TONGUE EVERY 5 MINUTES AS NEEDED FOR CHEST PAIN. DO NOT EXCEED A TOTAL OF 3 DOSES IN 15 MINUTES   omeprazole (PRILOSEC) 20 MG capsule Take 20 mg by mouth 2 (two) times daily as needed (for heartburn / acid reflux).    QUEtiapine (SEROQUEL) 100 MG tablet Take 25 mg by mouth at bedtime. Pt only takes 1/4 tab    spironolactone (ALDACTONE) 25 MG tablet Take 1 tablet (25 mg total) by mouth daily.   tamsulosin (FLOMAX) 0.4 MG CAPS capsule TAKE 1 CAPSULE BY MOUTH ONCE DAILY AFTER BREAKFAST   apixaban (ELIQUIS) 5 MG TABS tablet Take 1 tablet (5 mg total) by mouth 2 (two) times daily.   atorvastatin (LIPITOR) 40 MG tablet Take 1 tablet (40 mg total) by mouth daily.   No facility-administered encounter medications on file as of 10/04/2021.    Allergies  Allergen Reactions   Terazosin Rash    Rash when in sun   Enalapril Maleate Rash   Lisinopril Itching and Rash   Penicillins Itching and Rash    35 years ago    Adhesive [Tape] Rash    bandaids     Review of Systems  Constitutional:  Negative for activity change, appetite change, chills, diaphoresis, fatigue, fever and unexpected weight change.  Gastrointestinal:  Negative for abdominal pain.  Skin:        Breast mass  Neurological:  Negative for weakness and headaches.  Hematological:  Negative for adenopathy. Does not bruise/bleed easily.  Psychiatric/Behavioral:  Negative for confusion.   All other systems reviewed and are negative.      Objective:  BP 129/80   Pulse 67   Temp 97.8 F (36.6 C)   Ht 5' 11"  (1.803 m)   Wt 190 lb (86.2 kg)   SpO2 96%   BMI 26.50 kg/m    Wt Readings from Last 3 Encounters:  10/04/21 190 lb (86.2 kg)  08/31/21 182 lb (82.6 kg)  08/10/21 186 lb 6.4 oz (84.6 kg)    Physical Exam Vitals and nursing note reviewed.  Constitutional:      General: He is not in acute distress.    Appearance: Normal appearance. He is well-developed, well-groomed and normal weight. He is not ill-appearing, toxic-appearing or diaphoretic.  HENT:     Head: Normocephalic and atraumatic.     Jaw: There is normal jaw occlusion.     Right Ear: Hearing normal.     Left Ear: Hearing normal.  Nose: Nose normal.     Mouth/Throat:     Lips: Pink.     Mouth: Mucous membranes are moist.     Pharynx: Oropharynx is clear.  Uvula midline.  Eyes:     General: Lids are normal.     Extraocular Movements: Extraocular movements intact.     Conjunctiva/sclera: Conjunctivae normal.     Pupils: Pupils are equal, round, and reactive to light.  Neck:     Thyroid: No thyroid mass, thyromegaly or thyroid tenderness.     Vascular: No carotid bruit or JVD.     Trachea: Trachea and phonation normal.  Cardiovascular:     Rate and Rhythm: Normal rate and regular rhythm.     Chest Wall: PMI is not displaced.     Pulses: Normal pulses.     Heart sounds: Normal heart sounds. No murmur heard.   No friction rub. No gallop.  Pulmonary:     Effort: Pulmonary effort is normal. No respiratory distress.     Breath sounds: Normal breath sounds. No wheezing.  Chest:     Chest wall: No mass, lacerations, deformity, swelling, tenderness, crepitus or edema. There is no dullness to percussion.  Breasts:    Right: Normal.     Left: Tenderness present. No swelling, bleeding, inverted nipple, mass, nipple discharge or skin change.    Abdominal:     General: Bowel sounds are normal. There is no distension or abdominal bruit.     Palpations: Abdomen is soft. There is no hepatomegaly or splenomegaly.     Tenderness: There is no abdominal tenderness. There is no right CVA tenderness or left CVA tenderness.     Hernia: No hernia is present.  Musculoskeletal:        General: Normal range of motion.     Cervical back: Normal range of motion and neck supple.     Right lower leg: No edema.     Left lower leg: No edema.  Lymphadenopathy:     Cervical: No cervical adenopathy.     Upper Body:     Right upper body: No supraclavicular, axillary or pectoral adenopathy.     Left upper body: No supraclavicular, axillary or pectoral adenopathy.  Skin:    General: Skin is warm and dry.     Capillary Refill: Capillary refill takes less than 2 seconds.     Coloration: Skin is not cyanotic, jaundiced or pale.     Findings: No rash.  Neurological:      General: No focal deficit present.     Mental Status: He is alert and oriented to person, place, and time.     Sensory: Sensation is intact.     Motor: Motor function is intact.     Coordination: Coordination is intact.     Gait: Gait is intact.     Deep Tendon Reflexes: Reflexes are normal and symmetric.  Psychiatric:        Attention and Perception: Attention and perception normal.        Mood and Affect: Mood and affect normal.        Speech: Speech normal.        Behavior: Behavior normal. Behavior is cooperative.        Thought Content: Thought content normal.        Cognition and Memory: Cognition and memory normal.        Judgment: Judgment normal.    Results for orders placed or performed in visit on 98/33/82  Basic metabolic  panel  Result Value Ref Range   Glucose 82 65 - 99 mg/dL   BUN 11 8 - 27 mg/dL   Creatinine, Ser 0.80 0.76 - 1.27 mg/dL   eGFR 93 >59 mL/min/1.73   BUN/Creatinine Ratio 14 10 - 24   Sodium 141 134 - 144 mmol/L   Potassium 4.1 3.5 - 5.2 mmol/L   Chloride 103 96 - 106 mmol/L   CO2 23 20 - 29 mmol/L   Calcium 8.9 8.6 - 10.2 mg/dL       Pertinent labs & imaging results that were available during my care of the patient were reviewed by me and considered in my medical decision making.  Assessment & Plan:  [place] was seen today for breast pain left side, external.  Diagnoses and all orders for this visit:  Subareolar mass of left breast Will obtain US for further evaluation. Could be early abscess formation. Pt aware to report any redness, increased swelling, drainage, skin changes, fever, chills, weakness, or confusion. If symptoms develop, will treat with antibiotics. Further treatment pending [place] results.  -     US BREAST COMPLETE UNI LEFT INC AXILLA     Continue all other maintenance medications.  Follow up plan: Return if symptoms worsen or fail to improve.   Continue healthy lifestyle choices, including diet (rich in fruits,  vegetables, and lean proteins, and low in salt and simple carbohydrates) and exercise (at least 30 minutes of moderate physical activity daily).   The above assessment and management plan was discussed with the patient. The patient verbalized understanding of and has agreed to the management plan. Patient is aware to call the clinic if they develop any new symptoms or if symptoms persist or worsen. Patient is aware when to return to the clinic for a follow-up visit. Patient educated on when it is appropriate to go to the emergency department.   Monia Pouch, FNP-C [facility] [phone]

## 2021-10-05 ENCOUNTER — Other Ambulatory Visit: Payer: Self-pay

## 2021-10-05 DIAGNOSIS — N6342 Unspecified lump in left breast, subareolar: Secondary | ICD-10-CM

## 2021-10-11 ENCOUNTER — Telehealth: Payer: Self-pay | Admitting: Family Medicine

## 2021-10-11 NOTE — Telephone Encounter (Signed)
Calling to check on Joseph Larsen put on 10-25. Hadn't heard anything.

## 2021-10-11 NOTE — Telephone Encounter (Signed)
Please call patient/spouse with update on [place] status at [facility] when available.

## 2021-10-12 ENCOUNTER — Telehealth: Payer: Self-pay | Admitting: Family Medicine

## 2021-10-12 NOTE — Telephone Encounter (Signed)
Appointment made with [facility] in [place] at [phone] - patient's wife aware of appointment details and location - signed orders have been faxed

## 2021-10-12 NOTE — Telephone Encounter (Signed)
Pt would like to see if he could get an appt sooner for the scan of his chest. Stated that he was okay with going to [facility] if he could get in there sooner. Please call back and advise.

## 2021-10-12 NOTE — Telephone Encounter (Signed)
Patient has an appointment tomorrow with [facility] - notified his wife

## 2021-10-13 DIAGNOSIS — N62 Hypertrophy of breast: Secondary | ICD-10-CM | POA: Diagnosis not present

## 2021-10-13 DIAGNOSIS — N644 Mastodynia: Secondary | ICD-10-CM | POA: Diagnosis not present

## 2021-11-23 ENCOUNTER — Encounter (HOSPITAL_COMMUNITY): Payer: Medicare Other

## 2021-11-23 ENCOUNTER — Other Ambulatory Visit (HOSPITAL_COMMUNITY): Payer: Medicare Other

## 2021-12-23 IMAGING — CT CT ANGIO CHEST
2 of 6 series · 17 of 46 positions shown · IV contrast (APPLIED)
Comparison: None.

CLINICAL DATA: Aortic stenosis, dilated aorta

EXAM:
CT ANGIOGRAPHY CHEST WITH CONTRAST
TECHNIQUE: Multidetector CT imaging of the chest was performed using the
standard protocol during bolus administration of intravenous
contrast. Multiplanar CT image reconstructions and MIPs were
obtained to evaluate the vascular anatomy.
CONTRAST:  75mL OMNIPAQUE IOHEXOL 350 MG/ML SOLN

[Series 4: arterial · axial · arterial · 0.74mm/px · z∈[+1425,+1655]mm · 14 of 133 slices shown]
[im 9/133  lung]
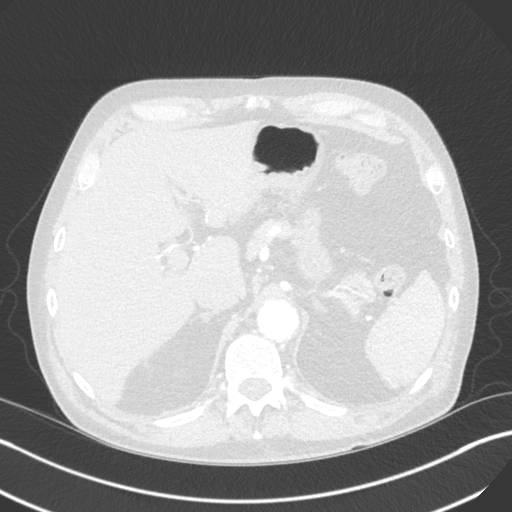
[im 18/133  soft-tissue]
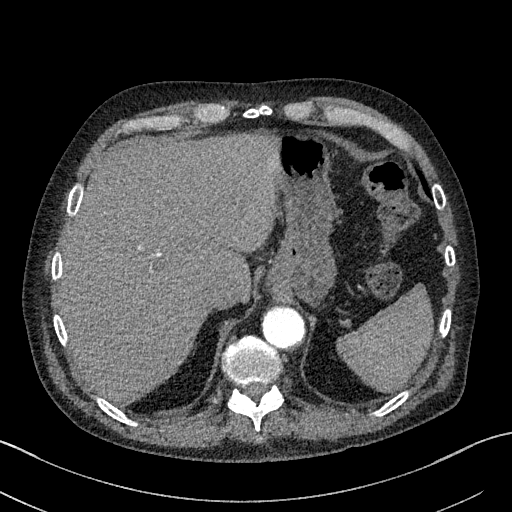
[im 27/133  lung]
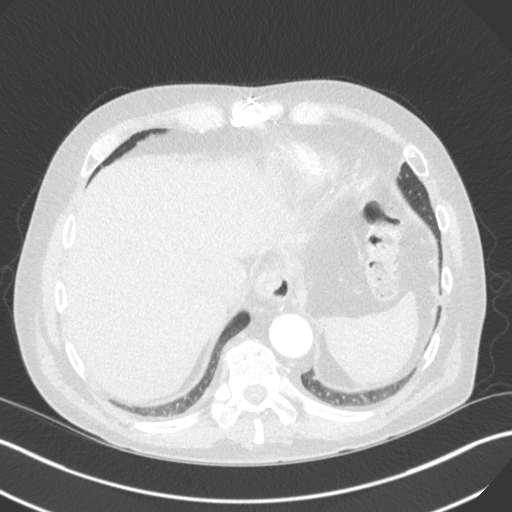
[im 36/133  soft-tissue]
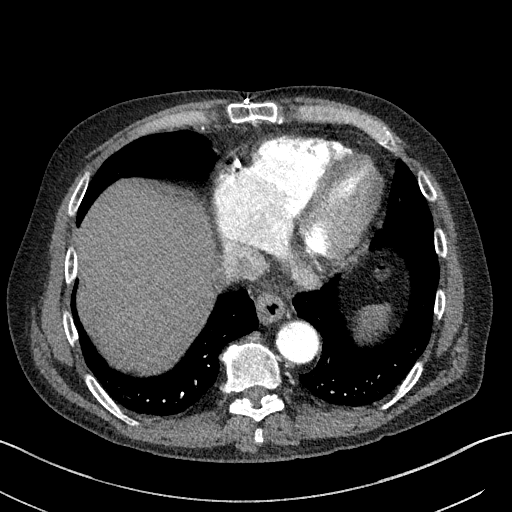
[im 45/133  lung]
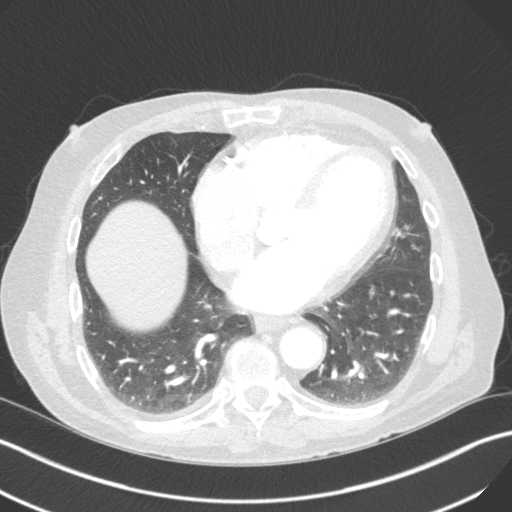
[im 53/133  soft-tissue]
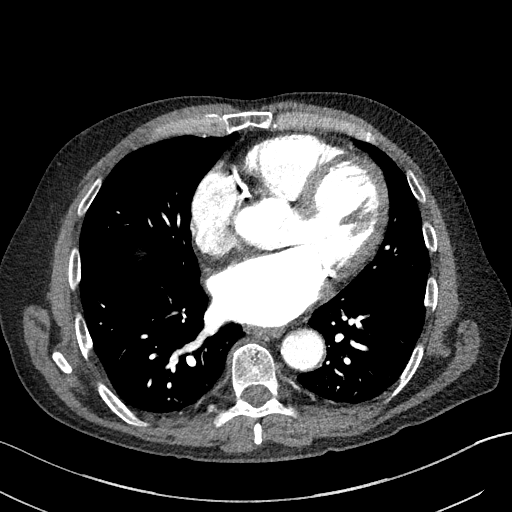
[im 62/133  lung]
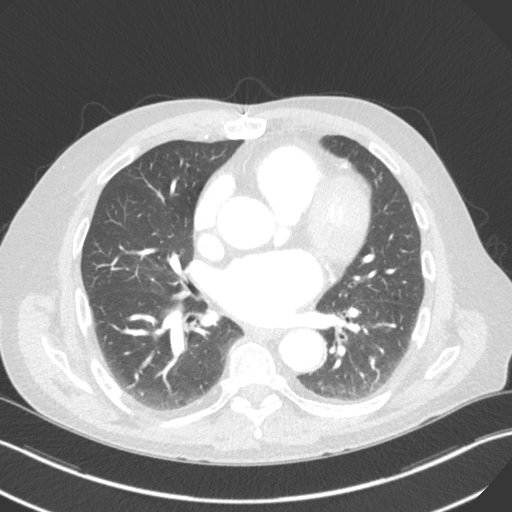
[im 71/133  soft-tissue]
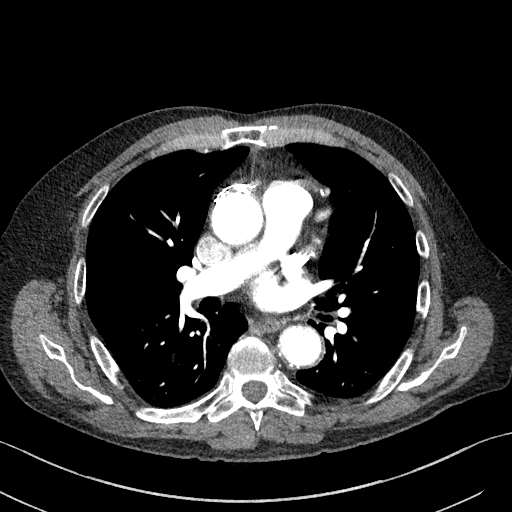
[im 80/133  lung]
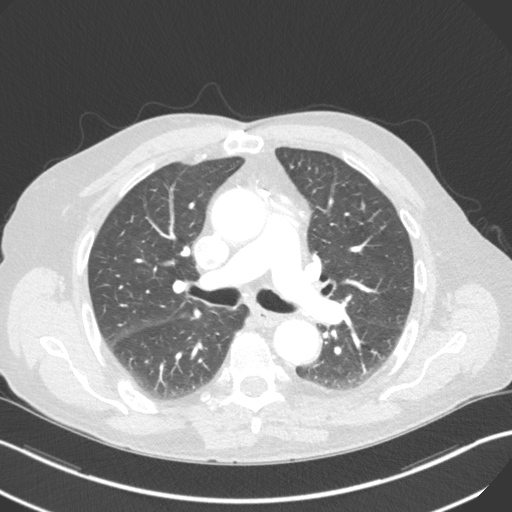
[im 89/133  soft-tissue]
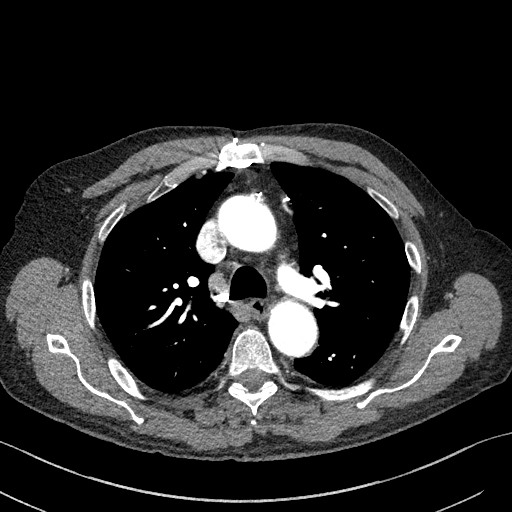
[im 97/133  lung]
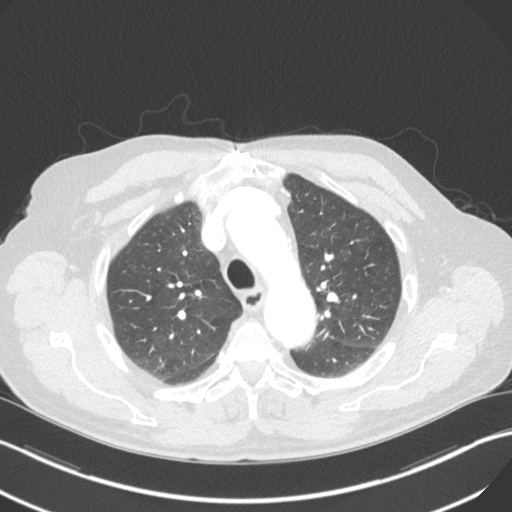
[im 106/133  soft-tissue]
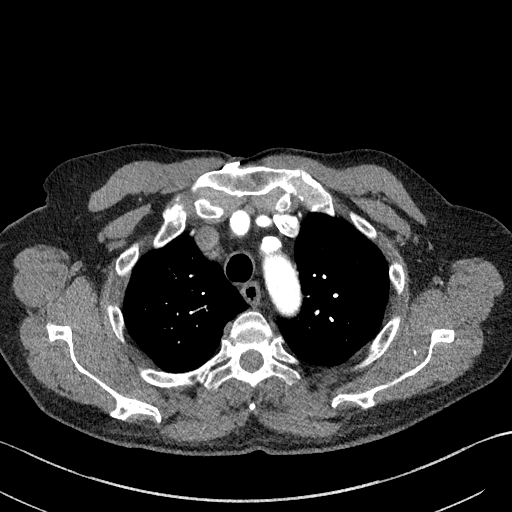
[im 115/133  lung]
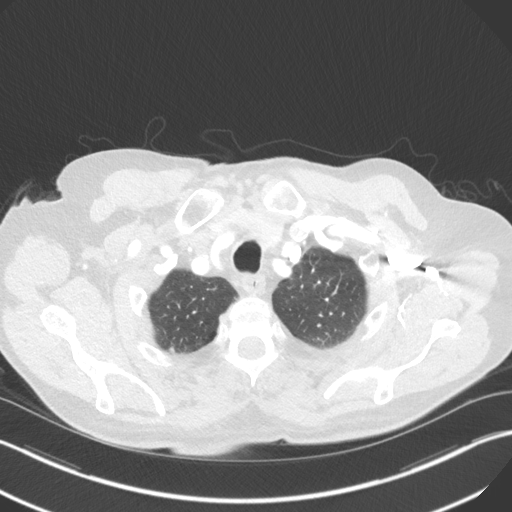
[im 124/133  soft-tissue]
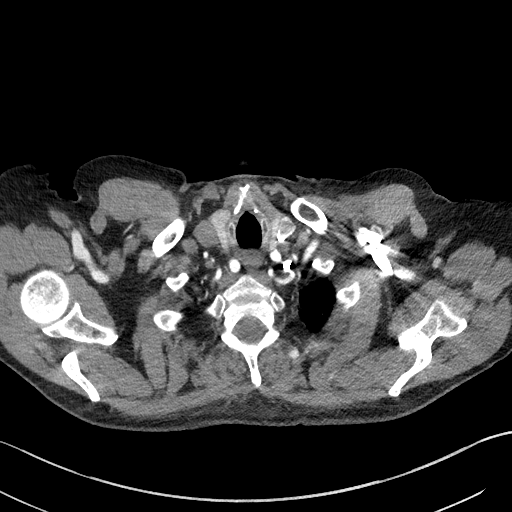

[Series 7: cor soft · coronal · 0.55mm/px · 3 of 145 slices shown]
[im 37/145  soft-tissue]
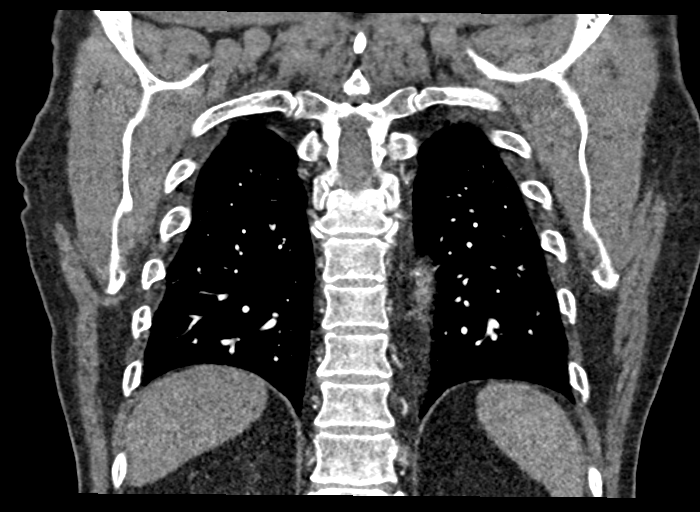
[im 73/145  soft-tissue]
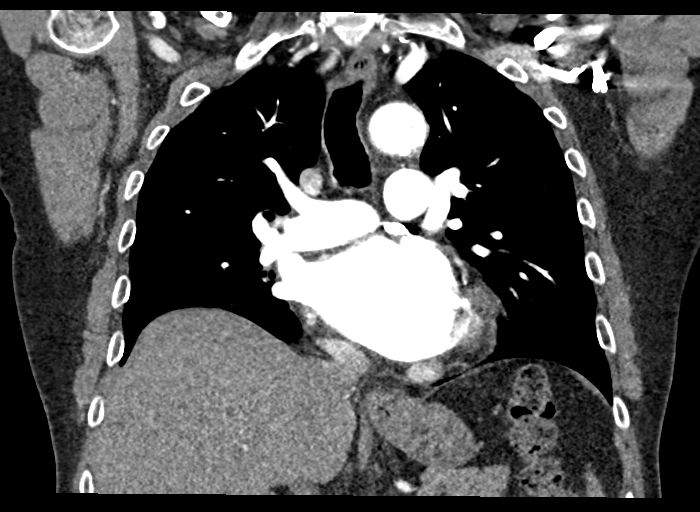
[im 109/145  soft-tissue]
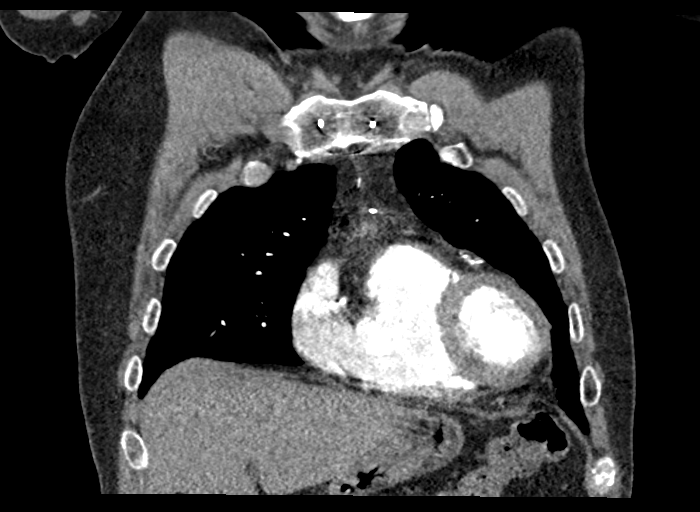

[17 of 46 positions shown; findings below may reference images not displayed]

FINDINGS: Cardiovascular: Preferential opacification of the thoracic aorta.
The ascending thoracic aorta measures up to 3.8 cm. The proximal
descending thoracic aorta measures up to 3.5 cm. The remainder of
the descending thoracic aorta is normal in caliber. Normal branching
pattern of the supra-aortic vessels which are patent. The pulmonary
arteries are normal in caliber and patent proximally. Left atrium
appears enlarged. Left atrial appendage device is present. Coronary
artery calcifications. No pericardial effusion.

Mediastinum/Nodes: No enlarged mediastinal, hilar, or axillary lymph
nodes. The thyroid gland appears normal.

Lungs/Pleura: No pleural effusion. No pneumothorax. No mass or focal
consolidation. No suspicious pulmonary nodules.

Musculoskeletal: No aggressive osseous lesions.  Median sternotomy.

Upper abdomen: The visualized upper abdomen is unremarkable.

Review of the MIP images confirms the above findings.
IMPRESSION: The ascending thoracic aorta measures up to 3.8 cm, with the
proximal descending thoracic aorta measuring up to 3.5 cm. No
findings to suggest aortic stenosis. Recommend annual imaging
followup by CTA or MRA. This recommendation follows 9010
ACCF/AHA/AATS/ACR/ASA/SCA/PAULUS N/OIL AZERBAIJAN/JORDAN ALEXANDER/LINDQUIST Guidelines for the
Diagnosis and Management of Patients with Thoracic Aortic Disease.
Circulation.9010; 121: [id number]. Aortic aneurysm NOS ([id number].N)

## 2022-01-31 NOTE — Progress Notes (Signed)
Cardiology Office Note:    Date:  02/04/2022   ID:  Joseph Larsen, DOB 1946-09-14, MRN [id number]  PCP:  [name], DO  [place] Cardiologist:  Jenkins Rouge, MD     Patient Profile:    Joseph Larsen is a 76 y.o. 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 Cyndia Bent; L-LAD, S-Dx, S-RI, S-OM) Paroxysmal >> persistent atrial fibrillation S/p DCCV 05/15/20 >> ERAF Flecainide started 6/21>>DCd 2/2 coronary artery disease  ETT-Myoview - no ischemia; no VT DCCV 06/01/20 >> NSR S/p bi-atrial MAZE and LAA clipping 9/21 Hypertension Hyperlipidemia Alcohol abuse Echo 6/19: EF 60-65 Echo 5/21: EF 55-60, LAE, LVH Echo 12/07/20 EF 60-65% aortic root 4.3 m Carotid artery Dz  [place] 9/21: bilat ICA 1-39      Prior CV studies: Carotid US 08/20/20 B/L ICA 1-39   Cardiac catheterization 08/07/20 LM mid 50 LAD prox 90 RI 90 LCx ost 70, mid 50 RCA prox 30 EF 55-65   Coronary CTA 07/21/20 Ca score 3159 (95th percentile) 1. Left Main:  No significant stenosis. FFR = 0.89 2. LAD: Significant stenosis. FFR 0.76 at takeoff of first diagonal, Mid FFR = 0.66, Distal FFR = 0.58 3. LCX: No significant stenosis. Proximal FFR = 0.86 4. RCA: Unable to be interpreted due to artifact. IMPRESSION: 1. CT FFR analysis showed significant stenosis in the LAD as noted, beginning near the takeoff of the first significant diagonal and extending distally.   ETT-Myoview 05/29/2020 Small inferior apical wall infarct, no ischemia, not gated due to atrial fibrillation, low risk ETT portion normal   Event monitor 05/04/2020 Atrial fibrillation, average heart rate 81, 3.1-second pause, <1% PVCs   Echocardiogram 04/23/2020 EF 55-60, no RWMA, mild LVH, normal RV SF, severe LAE, mildly dilated aortic root (39 mm), dilated ascending aorta (40 mm)  History of Present Illness:    76 y.o. f/u CAD/CABG August 26 2020 see above Has done well post op echo with normal EF and aortic root 4.3 cm CTA  07/26/21 aorta only 3.8 cm He has had PAF and been on flecainide but this was stopped when his CAD diagnosed Has been on eliquis Had MAZE And LAA clipping during CABG   Recovering well Sold his [place] No palpitations and no bleeding issues on eliquis   Has gynecomastia likely from aldactone will d/c   Past Medical History:  Diagnosis Date   Allergic rhinitis    Anxiety    Arthritis    BPH (benign prostatic hyperplasia)    Carotid artery disease ([place])    [place] 9/21: bilat ICA 1-39    Coronary artery disease cardiologist--- dr Johnsie Cancel   nuclear stress test showed low risk no ischemia 05-29-2020;  coronary CTA 07-21-2020 cal score 3159 with disease;  cath 08-07-2020 severe multiple cad;  08-26-2020 S/p CABG x4  and MAZE procedure  LIMA -LAD, SVG -Dx, SVG -RI, SVG -OM)   Depression    Diverticular disease    Dyspnea on exertion    Echocardiogram    Echocardiogram 12/21: EF 60-65, no RWMA, mod asymmetric basal-septal LVH, normal diastolic fn, normal RVSF, RVSP 26, mild MR, mild AV sclerosis (no AS), dilated aortic root (43 mm), dilated ascending aorta (43 mm), no effusion    ETOH abuse    First degree heart block    GERD (gastroesophageal reflux disease)    Gout    per pt last episode 2019   Hemorrhoids    Hiatal hernia    History of  2019 novel coronavirus disease (COVID-19)    11-27-2019 positive result in care everywhere, per pt mild symptoms that resolved   HTN (hypertension)    Hyperlipidemia 09/10/2020   Low testosterone    PAF (paroxysmal atrial fibrillation) [facility] cardiologist--- dr Johnsie Cancel--- first dx 08/ 2019   failed DCCV in 6/21 x2   // Flecainide Rx >>DC'd 2/2 CAD // s/p Maze + LAA clipping in 9/21 // Apixaban   Palpitations    PTSD (post-traumatic stress disorder)    [name] - [employer]   Right inguinal hernia    S/P Maze operation for atrial fibrillation 08/26/2020   at same time cabg    Current Medications: Current Meds  Medication Sig   acetaminophen (TYLENOL) 500 MG  tablet Take 1,000 mg by mouth daily as needed for moderate pain or headache.   ALPRAZolam (XANAX) 0.5 MG tablet Take 0.25 mg by mouth at bedtime.    atenolol (TENORMIN) 25 MG tablet Take 0.5 tablets (12.5 mg total) by mouth daily.   Cholecalciferol (VITAMIN D) 50 MCG (2000 UT) tablet Take 2,000 Units by mouth daily.   Cyanocobalamin (B-12 PO) Take 2 tablets by mouth 2 (two) times daily.   fluticasone (FLONASE) 50 MCG/ACT nasal spray Place 2 sprays into both nostrils 2 (two) times daily as needed for allergies or rhinitis.   isosorbide mononitrate (IMDUR) 30 MG 24 hr tablet Take 1 tablet (30 mg total) by mouth daily.   losartan (COZAAR) 100 MG tablet Take 1 tablet (100 mg total) by mouth daily.   nitroGLYCERIN (NITROSTAT) 0.4 MG SL tablet nitroglycerin 0.4 mg sublingual tablet DISSOLVE ONE TABLET UNDER THE TONGUE EVERY 5 MINUTES AS NEEDED FOR CHEST PAIN. DO NOT EXCEED A TOTAL OF 3 DOSES IN 15 MINUTES   omeprazole (PRILOSEC) 20 MG capsule Take 20 mg by mouth 2 (two) times daily as needed (for heartburn / acid reflux).    QUEtiapine (SEROQUEL) 100 MG tablet Take 25 mg by mouth at bedtime. Pt only takes 1/4 tab   spironolactone (ALDACTONE) 25 MG tablet Take 1 tablet (25 mg total) by mouth daily.   tamsulosin (FLOMAX) 0.4 MG CAPS capsule TAKE 1 CAPSULE BY MOUTH ONCE DAILY AFTER BREAKFAST     Allergies:   Terazosin, Enalapril maleate, Lisinopril, Penicillins, and Adhesive [tape]   Social History   Tobacco Use   Smoking status: Former    Packs/day: 3.00    Years: 20.00    Pack years: 60.00    Types: Cigarettes    Start date: 02/09/1965    Quit date: 05/12/1977    Years since quitting: 44.7   Smokeless tobacco: Never  Vaping Use   Vaping Use: Never used  Substance Use Topics   Alcohol use: Not Currently    Alcohol/week: 7.0 - 14.0 standard drinks    Types: 7 - 14 Cans of beer per week    Comment: 03-23-2021 per pt 1 to 2 beers daily (hx alcohol abuse   Drug use: Never     Family Hx: The  patient's family history includes Heart disease in his father and mother; Heart murmur in his sister; Hypertension in his brother, sister, sister, sister, and sister; Liver disease in his brother. There is no history of Colon cancer.  ROS   EKGs/Labs/Other Test Reviewed:    EKG:   SR rate 75 11/10/20   Recent Labs: 03/29/2021: Hemoglobin 15.0 07/21/2021: BUN 11; Creatinine, Ser 0.80; Potassium 4.1; Sodium 141   Recent Lipid Panel Lab Results  Component Value Date/Time  CHOL 77 (L) 11/18/2020 10:05 AM   TRIG 37 11/18/2020 10:05 AM   TRIG 82 03/25/2015 09:50 AM   HDL 32 (L) 11/18/2020 10:05 AM   HDL 38 (L) 03/25/2015 09:50 AM   CHOLHDL 2.4 11/18/2020 10:05 AM   LDLCALC 34 11/18/2020 10:05 AM   LDLCALC 54 07/17/2014 08:28 AM   LDLDIRECT 74 01/03/2017 11:00 AM      Risk Assessment/Calculations:     CHA2DS2-VASc Score =    This indicates a  % annual risk of stroke. The patient's score is based upon:      Physical Exam:    VS:  BP (!) 160/90    Pulse 60    Ht 5\' 11"  (1.803 m)    Wt 194 lb (88 kg)    SpO2 98%    BMI 27.06 kg/m     Wt Readings from Last 3 Encounters:  02/04/22 194 lb (88 kg)  10/04/21 190 lb (86.2 kg)  08/31/21 182 lb (82.6 kg)     Affect appropriate Healthy:  appears stated age 45: normal Neck supple with no adenopathy JVP normal no bruits no thyromegaly Lungs clear with no wheezing and good diaphragmatic motion Heart:  S1/S2 no murmur, no rub, gallop or click PMI normal post sternotomy  Abdomen: benighn, BS positve, no tenderness, no AAA no bruit.  No HSM or HJR Distal pulses intact with no bruits No edema Neuro non-focal Skin warm and dry No muscular weakness    ASSESSMENT & PLAN:    1. Essential hypertension - started on aldactone by PA 11/10/20 improved   2. CAD/CABG. -08/26/20 doing well with preserved EF continue ASA Beta blocker and statin   4. PAF) -Maintaining sinus rhythm.  He is status post Maze procedure and atrial  appendage clipping.  Continue eliqus   5. Hyperlipidemia, unspecified hyperlipidemia type -LDL optimal on most recent lab work.  Continue current Rx.    6. Dilated Aorta:  3.8 cm on CTA 07/26/21 observe   7. Gynecomastia:  :  d/c aldactone      Dispo:  F/U in 3 months   Medication Adjustments/Labs and Tests Ordered: Current medicines are reviewed at length with the patient today.  Concerns regarding medicines are outlined above.  Tests Ordered: Orders Placed This Encounter  Procedures   EKG 12-Lead   Medication Changes: No orders of the defined types were placed in this encounter.   Signed, Jenkins Rouge, MD  02/04/2022 10:18 AM    [address], [place], [place]  [address] Phone: [phone]; Fax: [phone]

## 2022-02-04 ENCOUNTER — Ambulatory Visit (INDEPENDENT_AMBULATORY_CARE_PROVIDER_SITE_OTHER): Payer: Medicare Other | Admitting: Cardiovascular Disease

## 2022-02-04 ENCOUNTER — Other Ambulatory Visit: Payer: Self-pay

## 2022-02-04 VITALS — BP 160/90 | HR 60 | Ht 71.0 in | Wt 194.0 lb

## 2022-02-04 DIAGNOSIS — I480 Paroxysmal atrial fibrillation: Secondary | ICD-10-CM | POA: Diagnosis not present

## 2022-02-04 DIAGNOSIS — I25119 Atherosclerotic heart disease of native coronary artery with unspecified angina pectoris: Secondary | ICD-10-CM

## 2022-02-04 DIAGNOSIS — I712 Thoracic aortic aneurysm, without rupture, unspecified: Secondary | ICD-10-CM

## 2022-02-04 DIAGNOSIS — N62 Hypertrophy of breast: Secondary | ICD-10-CM

## 2022-02-04 DIAGNOSIS — Z951 Presence of aortocoronary bypass graft: Secondary | ICD-10-CM | POA: Diagnosis not present

## 2022-02-04 NOTE — Patient Instructions (Addendum)
Medication Instructions:  Your physician has recommended you make the following change in your medication:   STOP Aldactone  *If you need a refill on your cardiac medications before your next appointment, please call your pharmacy*  Lab Work: If you have labs (blood work) drawn today and your tests are completely normal, you will receive your results only by: [place] (if you have MyChart) OR A paper copy in the mail If you have any lab test that is abnormal or we need to change your treatment, we will call you to review the results.  Testing/Procedures: None ordered today.  Follow-Up: At [facility], you and your health needs are our priority.  As part of our continuing mission to provide you with exceptional heart care, we have created designated Provider Care Teams.  These Care Teams include your primary Cardiologist (physician) and Advanced Practice Providers (APPs -  Physician Assistants and Nurse Practitioners) who all work together to provide you with the care you need, when you need it.  We recommend signing up for the patient portal called "MyChart".  Sign up information is provided on this After Visit Summary.  MyChart is used to connect with patients for Virtual Visits (Telemedicine).  Patients are able to view lab/test results, encounter notes, upcoming appointments, etc.  Non-urgent messages can be sent to your provider as well.   To learn more about what you can do with MyChart, go to [email or web address].    Your next appointment:   3 months  The format for your next appointment:   In Person  Provider:   Jenkins Rouge, MD {

## 2022-02-23 ENCOUNTER — Telehealth: Payer: Self-pay | Admitting: Cardiovascular Disease

## 2022-02-23 NOTE — Telephone Encounter (Signed)
Per Dr. Johnsie Cancel, he is okay to take eplerenone since his K/CR have been normal. Called patient back with response. Patient verbalized understanding. ?

## 2022-02-23 NOTE — Telephone Encounter (Signed)
? ?  Pt c/o medication issue: ? ?1. Name of Medication: eplerenone ? ?2. How are you currently taking this medication (dosage and times per day)?  ? ?3. Are you having a reaction (difficulty breathing--STAT)?  ? ?4. What is your medication issue? Pt said, hid [place] doctor wanted to prescribed this meds. He said since he stopped taking the spironolactone his BP been elevated and that's the reason why [facility] wanted [facility] to take eplerenone. He wanted to check in with Dr. Johnsie Cancel if that is ok ? ?

## 2022-02-25 ENCOUNTER — Encounter: Payer: Self-pay | Admitting: Family Medicine

## 2022-02-25 ENCOUNTER — Ambulatory Visit (INDEPENDENT_AMBULATORY_CARE_PROVIDER_SITE_OTHER): Payer: Medicare Other | Admitting: Family Medicine

## 2022-02-25 DIAGNOSIS — J40 Bronchitis, not specified as acute or chronic: Secondary | ICD-10-CM | POA: Diagnosis not present

## 2022-02-25 MED ORDER — PREDNISONE 20 MG PO TABS: 40.0000 mg | ORAL_TABLET | Freq: Every day | ORAL | 0 refills | Status: AC

## 2022-02-25 NOTE — Progress Notes (Signed)
? ?Virtual Visit via Telephone Note ? ?I connected with Joseph Larsen on 02/25/22 at 1:05 PM by telephone and verified that I am speaking with the correct person using two identifiers. Joseph Larsen is currently located at home and his wife is currently with him during this visit. The provider, Loman Brooklyn, FNP is located in their office at time of visit. ? ?I discussed the limitations, risks, security and privacy concerns of performing an evaluation and management service by telephone and the availability of in person appointments. I also discussed with the patient that there may be a patient responsible charge related to this service. The patient expressed understanding and agreed to proceed. ? ?Subjective: ?PCP: [name], DO ? ?Chief Complaint  ?Patient presents with  ? URI  ? ?Patient complains of cough, chest congestion, sore throat, and wheezing. Onset of symptoms was 3 days ago, gradually worsening since that time. He is drinking plenty of fluids. Evaluation to date: at home COVID test negative. Treatment to date: cough suppressants and elderberry . He does not smoke.  ? ? ?ROS: Per HPI ? ?Current Outpatient Medications:  ?  acetaminophen (TYLENOL) 500 MG tablet, Take 1,000 mg by mouth daily as needed for moderate pain or headache., Disp: , Rfl:  ?  ALPRAZolam (XANAX) 0.5 MG tablet, Take 0.25 mg by mouth at bedtime. , Disp: , Rfl:  ?  apixaban (ELIQUIS) 5 MG TABS tablet, Take 1 tablet (5 mg total) by mouth 2 (two) times daily., Disp: 60 tablet, Rfl:  ?  atenolol (TENORMIN) 25 MG tablet, Take 0.5 tablets (12.5 mg total) by mouth daily., Disp: 45 tablet, Rfl: 3 ?  atorvastatin (LIPITOR) 40 MG tablet, Take 1 tablet (40 mg total) by mouth daily., Disp: 30 tablet, Rfl: 11 ?  Cholecalciferol (VITAMIN D) 50 MCG (2000 UT) tablet, Take 2,000 Units by mouth daily., Disp: , Rfl:  ?  Cyanocobalamin (B-12 PO), Take 2 tablets by mouth 2 (two) times daily., Disp: , Rfl:  ?  fluticasone (FLONASE) 50 MCG/ACT  nasal spray, Place 2 sprays into both nostrils 2 (two) times daily as needed for allergies or rhinitis., Disp: , Rfl:  ?  isosorbide mononitrate (IMDUR) 30 MG 24 hr tablet, Take 1 tablet (30 mg total) by mouth daily., Disp: 90 tablet, Rfl: 3 ?  losartan (COZAAR) 100 MG tablet, Take 1 tablet (100 mg total) by mouth daily., Disp: 90 tablet, Rfl: 3 ?  nitroGLYCERIN (NITROSTAT) 0.4 MG SL tablet, nitroglycerin 0.4 mg sublingual tablet DISSOLVE ONE TABLET UNDER THE TONGUE EVERY 5 MINUTES AS NEEDED FOR CHEST PAIN. DO NOT EXCEED A TOTAL OF 3 DOSES IN 15 MINUTES, Disp: 25 tablet, Rfl: 7 ?  omeprazole (PRILOSEC) 20 MG capsule, Take 20 mg by mouth 2 (two) times daily as needed (for heartburn / acid reflux). , Disp: , Rfl:  ?  QUEtiapine (SEROQUEL) 100 MG tablet, Take 25 mg by mouth at bedtime. Pt only takes 1/4 tab, Disp: , Rfl:  ?  tamsulosin (FLOMAX) 0.4 MG CAPS capsule, TAKE 1 CAPSULE BY MOUTH ONCE DAILY AFTER BREAKFAST, Disp: 90 capsule, Rfl: 0 ? ?Allergies  ?Allergen Reactions  ? Terazosin Rash  ?  Rash when in sun  ? Enalapril Maleate Rash  ? Lisinopril Itching and Rash  ? Penicillins Itching and Rash  ?  35 years ago ?  ? Adhesive [Tape] Rash  ?  bandaids   ? ?Past Medical History:  ?Diagnosis Date  ? Allergic rhinitis   ? Anxiety   ?  Arthritis   ? BPH (benign prostatic hyperplasia)   ? Carotid artery disease ([place])   ? [place] 9/21: bilat ICA 1-39   ? Coronary artery disease cardiologist--- dr Johnsie Larsen  ? nuclear stress test showed low risk no ischemia 05-29-2020;  coronary CTA 07-21-2020 cal score 3159 with disease;  cath 08-07-2020 severe multiple cad;  08-26-2020 S/p CABG x4  and MAZE procedure  LIMA -LAD, SVG -Dx, SVG -RI, SVG -OM)  ? Depression   ? Diverticular disease   ? Dyspnea on exertion   ? Echocardiogram   ? Echocardiogram 12/21: EF 60-65, no RWMA, mod asymmetric basal-septal LVH, normal diastolic fn, normal RVSF, RVSP 26, mild MR, mild AV sclerosis (no AS), dilated aortic root (43 mm), dilated ascending aorta (43  mm), no effusion   ? ETOH abuse   ? First degree heart block   ? GERD (gastroesophageal reflux disease)   ? Gout   ? per pt last episode 2019  ? Hemorrhoids   ? Hiatal hernia   ? History of 2019 novel coronavirus disease (COVID-19)   ? 11-27-2019 positive result in care everywhere, per pt mild symptoms that resolved  ? HTN (hypertension)   ? Hyperlipidemia 09/10/2020  ? Low testosterone   ? PAF (paroxysmal atrial fibrillation) [facility] cardiologist--- dr Johnsie Larsen--- first dx 08/ 2019  ? failed DCCV in 6/21 x2   // Flecainide Rx >>DC'd 2/2 CAD // s/p Maze + LAA clipping in 9/21 // Apixaban  ? Palpitations   ? PTSD (post-traumatic stress disorder)   ? [name] [employer]  ? Right inguinal hernia   ? S/P Maze operation for atrial fibrillation 08/26/2020  ? at same time cabg  ? ? ?Observations/Objective: ?A&O  ?No respiratory distress or wheezing audible over the phone ?Mood, judgement, and thought processes all WNL ? ?Assessment and Plan: ?1. Bronchitis ?- predniSONE (DELTASONE) 20 MG tablet; Take 2 tablets (40 mg total) by mouth daily with breakfast for 5 days.  Dispense: 10 tablet; Refill: 0 ? ? ?Follow Up Instructions: ? ?I discussed the assessment and treatment plan with the patient. The patient was provided an opportunity to ask questions and all were answered. The patient agreed with the plan and demonstrated an understanding of the instructions. ?  ?The patient was advised to call back or seek an in-person evaluation if the symptoms worsen or if the condition fails to improve as anticipated. ? ?The above assessment and management plan was discussed with the patient. The patient verbalized understanding of and has agreed to the management plan. Patient is aware to call the clinic if symptoms persist or worsen. Patient is aware when to return to the clinic for a follow-up visit. Patient educated on when it is appropriate to go to the emergency department.  ? ?Time call ended: 1:16 PM ? ?I provided 11 minutes of  non-face-to-face time during this encounter. ? ?Hendricks Limes, MSN, APRN, FNP-C ?[employer] ?02/25/22 ?

## 2022-03-01 ENCOUNTER — Other Ambulatory Visit: Payer: Self-pay

## 2022-03-01 ENCOUNTER — Ambulatory Visit (INDEPENDENT_AMBULATORY_CARE_PROVIDER_SITE_OTHER): Payer: Medicare Other | Admitting: Family Medicine

## 2022-03-01 ENCOUNTER — Encounter: Payer: Self-pay | Admitting: Family Medicine

## 2022-03-01 VITALS — BP 169/89 | HR 57 | Temp 98.1°F | Resp 20 | Ht 71.0 in | Wt 194.0 lb

## 2022-03-01 DIAGNOSIS — J069 Acute upper respiratory infection, unspecified: Secondary | ICD-10-CM

## 2022-03-01 DIAGNOSIS — I10 Essential (primary) hypertension: Secondary | ICD-10-CM

## 2022-03-01 LAB — VERITOR FLU A/B WAIVED
Influenza A: NEGATIVE
Influenza B: NEGATIVE

## 2022-03-01 LAB — RAPID STREP SCREEN (MED CTR MEBANE ONLY): Strep Gp A Ag, IA W/Reflex: NEGATIVE

## 2022-03-01 LAB — CULTURE, GROUP A STREP

## 2022-03-01 MED ORDER — ALBUTEROL SULFATE HFA 108 (90 BASE) MCG/ACT IN AERS: 2.0000 | INHALATION_SPRAY | Freq: Four times a day (QID) | RESPIRATORY_TRACT | 0 refills | Status: DC | PRN

## 2022-03-01 MED ORDER — BENZONATATE 100 MG PO CAPS: 100.0000 mg | ORAL_CAPSULE | Freq: Three times a day (TID) | ORAL | 0 refills | Status: DC | PRN

## 2022-03-01 MED ORDER — CEFDINIR 300 MG PO CAPS: 300.0000 mg | ORAL_CAPSULE | Freq: Two times a day (BID) | ORAL | 0 refills | Status: AC

## 2022-03-01 NOTE — Patient Instructions (Signed)
PLAIN mucinex is ok.  This should only have guaifenesin in it. ? ?It appears that you have a viral upper respiratory infection (cold).  Cold symptoms can last up to 2 weeks.  I recommend that you only use cold medications that are safe in high blood pressure like Coricidin (generic is fine).  Other cold medications can increase your blood pressure. ? ? ? ?- Get plenty of rest and drink plenty of fluids. ?- Try to breathe moist air. Use a cold mist humidifier. ?- Consume warm fluids (soup or tea) to provide relief for a stuffy nose and to loosen phlegm. ?- For nasal stuffiness, try saline nasal spray or a Neti Pot.  Afrin nasal spray can also be used but this product should not be used longer than 3 days or it will cause rebound nasal stuffiness (worsening nasal congestion). ?- For sore throat pain relief: use chloraseptic spray, suck on throat lozenges, hard candy or popsicles; gargle with warm salt water (1/4 tsp. salt per 8 oz. of water); and eat soft, bland foods. ?- Eat a well-balanced diet. If you cannot, ensure you are getting enough nutrients by taking a daily multivitamin. ?- Avoid dairy products, as they can thicken phlegm. ?- Avoid alcohol, as it impairs your body?s immune system. ? ?CONTACT YOUR DOCTOR IF YOU EXPERIENCE ANY OF THE FOLLOWING: ?- High fever ?- Ear pain ?- Sinus-type headache ?- Unusually severe cold symptoms ?- Cough that gets worse while other cold symptoms improve ?- Flare up of any chronic lung problem, such as asthma ?- Your symptoms persist longer than 2 weeks ? ?

## 2022-03-01 NOTE — Progress Notes (Unsigned)
Placed order for pharmacy referral. Left message for patient to call back. ?

## 2022-03-01 NOTE — Progress Notes (Signed)
? ?Subjective: ?CC: URI ?PCP: [name], DO ?Joseph Larsen is a 76 y.o. male presenting to clinic today for: ? ?1.  URI ?Patient is accompanied today by his spouse.  He has been having some coughing since last week.  He notes that it is productive after he gets out of the shower but towards the end of the day it is dry.  No hemoptysis.  No fevers reported.  He was placed on prednisone about 4 days ago in efforts to improve wheezing.  He is a former smoker but quit in his 44s.  No history of asthma or COPD.  He had a negative rapid COVID at home.  Has been using OTC medications for symptoms.  He has had multiple sick contacts at [employer].  No reports of nausea, vomiting or diarrhea ? ?2.  Hypertension ?Patient notes that his blood pressure has not been well controlled.  He brings a blood pressure log today which shows systolics anywhere from the 140s to 160s over 80s to 90s.  He recently had spironolactone switched to eplerenone secondary to development of gynecomastia.  This was done at the [place] but his civilian cardiologist was in agreement.  Has not had any blood work to check renal function since change in medication. ? ?ROS: Per HPI ? ?Allergies  ?Allergen Reactions  ? Terazosin Rash  ?  Rash when in sun  ? Enalapril Maleate Rash  ? Lisinopril Itching and Rash  ? Penicillins Itching and Rash  ?  35 years ago ?  ? Adhesive [Tape] Rash  ?  bandaids   ? ?Past Medical History:  ?Diagnosis Date  ? Allergic rhinitis   ? Anxiety   ? Arthritis   ? BPH (benign prostatic hyperplasia)   ? Carotid artery disease ([place])   ? [place] 9/21: bilat ICA 1-39   ? Coronary artery disease cardiologist--- dr Johnsie Cancel  ? nuclear stress test showed low risk no ischemia 05-29-2020;  coronary CTA 07-21-2020 cal score 3159 with disease;  cath 08-07-2020 severe multiple cad;  08-26-2020 S/p CABG x4  and MAZE procedure  LIMA -LAD, SVG -Dx, SVG -RI, SVG -OM)  ? Depression   ? Diverticular disease   ? Dyspnea on exertion   ? Echocardiogram    ? Echocardiogram 12/21: EF 60-65, no RWMA, mod asymmetric basal-septal LVH, normal diastolic fn, normal RVSF, RVSP 26, mild MR, mild AV sclerosis (no AS), dilated aortic root (43 mm), dilated ascending aorta (43 mm), no effusion   ? ETOH abuse   ? First degree heart block   ? GERD (gastroesophageal reflux disease)   ? Gout   ? per pt last episode 2019  ? Hemorrhoids   ? Hiatal hernia   ? History of 2019 novel coronavirus disease (COVID-19)   ? 11-27-2019 positive result in care everywhere, per pt mild symptoms that resolved  ? HTN (hypertension)   ? Hyperlipidemia 09/10/2020  ? Low testosterone   ? PAF (paroxysmal atrial fibrillation) [facility]) cardiologist--- dr Johnsie Cancel--- first dx 08/ 2019  ? failed DCCV in 6/21 x2   // Flecainide Rx >>DC'd 2/2 CAD // s/p Maze + LAA clipping in 9/21 // Apixaban  ? Palpitations   ? PTSD (post-traumatic stress disorder)   ? [name] [employer]  ? Right inguinal hernia   ? S/P Maze operation for atrial fibrillation 08/26/2020  ? at same time cabg  ? ? ?Current Outpatient Medications:  ?  acetaminophen (TYLENOL) 500 MG tablet, Take 1,000 mg by mouth  daily as needed for moderate pain or headache., Disp: , Rfl:  ?  ALPRAZolam (XANAX) 0.5 MG tablet, Take 0.25 mg by mouth at bedtime. , Disp: , Rfl:  ?  apixaban (ELIQUIS) 5 MG TABS tablet, Take 1 tablet (5 mg total) by mouth 2 (two) times daily., Disp: 60 tablet, Rfl:  ?  atenolol (TENORMIN) 25 MG tablet, Take 0.5 tablets (12.5 mg total) by mouth daily., Disp: 45 tablet, Rfl: 3 ?  atorvastatin (LIPITOR) 40 MG tablet, Take 1 tablet (40 mg total) by mouth daily., Disp: 30 tablet, Rfl: 11 ?  Cholecalciferol (VITAMIN D) 50 MCG (2000 UT) tablet, Take 2,000 Units by mouth daily., Disp: , Rfl:  ?  Cyanocobalamin (B-12 PO), Take 2 tablets by mouth 2 (two) times daily., Disp: , Rfl:  ?  fluticasone (FLONASE) 50 MCG/ACT nasal spray, Place 2 sprays into both nostrils 2 (two) times daily as needed for allergies or rhinitis., Disp: , Rfl:  ?  isosorbide  mononitrate (IMDUR) 30 MG 24 hr tablet, Take 1 tablet (30 mg total) by mouth daily., Disp: 90 tablet, Rfl: 3 ?  losartan (COZAAR) 100 MG tablet, Take 1 tablet (100 mg total) by mouth daily., Disp: 90 tablet, Rfl: 3 ?  nitroGLYCERIN (NITROSTAT) 0.4 MG SL tablet, nitroglycerin 0.4 mg sublingual tablet DISSOLVE ONE TABLET UNDER THE TONGUE EVERY 5 MINUTES AS NEEDED FOR CHEST PAIN. DO NOT EXCEED A TOTAL OF 3 DOSES IN 15 MINUTES, Disp: 25 tablet, Rfl: 7 ?  omeprazole (PRILOSEC) 20 MG capsule, Take 20 mg by mouth 2 (two) times daily as needed (for heartburn / acid reflux). , Disp: , Rfl:  ?  predniSONE (DELTASONE) 20 MG tablet, Take 2 tablets (40 mg total) by mouth daily with breakfast for 5 days., Disp: 10 tablet, Rfl: 0 ?  QUEtiapine (SEROQUEL) 100 MG tablet, Take 25 mg by mouth at bedtime. Pt only takes 1/4 tab, Disp: , Rfl:  ?  tamsulosin (FLOMAX) 0.4 MG CAPS capsule, TAKE 1 CAPSULE BY MOUTH ONCE DAILY AFTER BREAKFAST, Disp: 90 capsule, Rfl: 0 ?Social History  ? ?Socioeconomic History  ? Marital status: Married  ?  Spouse name: Suanne Marker  ? Number of children: 1  ? Years of education: Not on file  ? Highest education level: Not on file  ?Occupational History  ? Occupation: disabled  ?  Employer: RETIRED  ?  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  ?Tobacco Use  ? Smoking status: Former  ?  Packs/day: 3.00  ?  Years: 20.00  ?  Pack years: 60.00  ?  Types: Cigarettes  ?  Start date: 02/09/1965  ?  Quit date: 05/12/1977  ?  Years since quitting: 44.8  ? Smokeless tobacco: Never  ?Vaping Use  ? Vaping Use: Never used  ?Substance and Sexual Activity  ? Alcohol use: Not Currently  ?  Alcohol/week: 7.0 - 14.0 standard drinks  ?  Types: 7 - 14 Cans of beer per week  ?  Comment: 03-23-2021 per pt 1 to 2 beers daily (hx alcohol abuse  ? Drug use: Never  ? Sexual activity: Yes  ?Other Topics Concern  ? Not on file  ?Social History Narrative  ? Lives with wife in [place] in a one story home.  Exercises @ gym 3x/wk - stationary  bike and light weight training.  ? ?Social Determinants of Health  ? ?Financial Resource Strain: Low Risk   ? Difficulty of Paying Living Expenses: Not hard at all  ?Food Insecurity: No Food Insecurity  ?  Worried About Charity fundraiser in the Last Year: Never true  ? Ran Out of Food in the Last Year: Never true  ?Transportation Needs: No Transportation Needs  ? Lack of Transportation (Medical): No  ? Lack of Transportation (Non-Medical): No  ?Physical Activity: Insufficiently Active  ? Days of Exercise per Week: 4 days  ? Minutes of Exercise per Session: 30 min  ?Stress: No Stress Concern Present  ? Feeling of Stress : Not at all  ?Social Connections: Moderately Isolated  ? Frequency of Communication with Friends and Family: More than three times a week  ? Frequency of Social Gatherings with Friends and Family: Three times a week  ? Attends Religious Services: Never  ? Active Member of Clubs or Organizations: No  ? Attends Archivist Meetings: Never  ? Marital Status: Married  ?Intimate Partner Violence: Not At Risk  ? Fear of Current or Ex-Partner: No  ? Emotionally Abused: No  ? Physically Abused: No  ? Sexually Abused: No  ? ?Family History  ?Problem Relation Age of Onset  ? Heart disease Mother   ?     "died of chf"  ? Heart disease Father   ?     "died of old age"  ? Hypertension Sister   ? Liver disease Brother   ? Hypertension Brother   ? Hypertension Sister   ? Hypertension Sister   ? Hypertension Sister   ? Heart murmur Sister   ? Colon cancer Neg Hx   ? ? ?Objective: ?Office vital signs reviewed. ?BP (!) 169/89   Pulse (!) 57   Temp 98.1 ?F (36.7 ?C)   Resp 20   Ht '5\' 11"'$  (1.803 m)   Wt 194 lb (88 kg)   SpO2 98%   BMI 27.06 kg/m?  ? ?Physical Examination:  ?General: Awake, alert, nontoxic male, No acute distress ?HEENT: Sclera white.  Moist mucous membranes.  Oropharynx without significant erythema or exudates.  TMs intact bilaterally.  Nares with some opaque discharge. ?Cardio: regular  rate and rhythm, S1S2 heard, no murmurs appreciated ?Pulm: Expiratory wheezes throughout.  Normal work of breathing on room air.  Intermittent coughing spells appreciated ? ?Assessment/ Plan: ?76 y.o. male  ? ?

## 2022-03-01 NOTE — Addendum Note (Signed)
Addended by: Zannie Cove on: 03/01/2022 11:20 AM ? ? Modules accepted: Orders ? ?

## 2022-03-02 LAB — BASIC METABOLIC PANEL
BUN/Creatinine Ratio: 12 (ref 10–24)
BUN: 10 mg/dL (ref 8–27)
CO2: 25 mmol/L (ref 20–29)
Calcium: 9.2 mg/dL (ref 8.6–10.2)
Chloride: 101 mmol/L (ref 96–106)
Creatinine, Ser: 0.81 mg/dL (ref 0.76–1.27)
Glucose: 90 mg/dL (ref 70–99)
Potassium: 3.5 mmol/L (ref 3.5–5.2)
Sodium: 140 mmol/L (ref 134–144)
eGFR: 93 mL/min/{1.73_m2} (ref >59)

## 2022-03-02 LAB — NOVEL CORONAVIRUS, NAA: SARS-CoV-2, NAA: NOT DETECTED

## 2022-03-04 ENCOUNTER — Telehealth: Payer: Self-pay | Admitting: Family Medicine

## 2022-03-04 LAB — CULTURE, GROUP A STREP: Strep A Culture: NEGATIVE

## 2022-03-04 NOTE — Telephone Encounter (Signed)
Pt states he is just not feeling any better, going on day 10 and he just isnt getting any relief, I have instructed pt to get delsym otc and says he has tried this and it is not helping ?

## 2022-03-04 NOTE — Telephone Encounter (Signed)
?  Incoming Patient Call ? ?03/04/2022 ? ?What symptoms do you have? Cough,congestion and wheezing. Was seen with Dr. Lajuana Ripple 3-21 and told him to call back if no better. ? ?How long have you been sick? 10 days ? ?Have you been seen for this problem? YES 3-21 with Lajuana Ripple ? ?If your provider decides to give you a prescription, which pharmacy would you like for it to be sent to? [facility] ? ? ?Patient informed that this information will be sent to the clinical staff for review and that they should receive a follow up call.  ?

## 2022-03-04 NOTE — Telephone Encounter (Signed)
He has tolerated cephalosporins in the past.  There is only a very small percent chance that he would have a cross-reactivity and this is really only important if he has had history of anaphylaxis to penicillin before ?

## 2022-03-04 NOTE — Telephone Encounter (Signed)
Pt states he is allergic to penicillin and the medication information says he shouldn't be taking this???? Please advise ?

## 2022-03-04 NOTE — Telephone Encounter (Signed)
Start the [facility] prescription I gave him earlier this week. ?

## 2022-03-04 NOTE — Telephone Encounter (Signed)
Pt aware.

## 2022-03-28 NOTE — Progress Notes (Signed)
Patient ID: Joseph Larsen                 DOB: 22-Apr-1946                      MRN: [id number] ? ? ? ? ?HPI: ?Joseph Larsen is a 76 y.o. male referred by Dr. Johnsie Cancel to HTN clinic. PMH is significant for CAD s/p CABG in 08/2020, AF, HTN, HLD, and hx of alcohol abuse. Pt saw Dr. Johnsie Cancel on 02/08/22 for routine f/u after CABG in 2021. BP was elevated to 160/90. Pt reported gynecomastia on spironolactone so that was d/c'd and no other med changes made. [facility]ple[facility]one and Dr. Johnsie Cancel ok'd him to start that med on 02/23/22 since his K and Scr was normal. Pt saw Dr. Lajuana Ripple on 03/01/22 and BP was 169/89 in clinic. Also brought his home log to that visit which showed systolics anywhere from the 140s to 160s over 80s to 90s. She was going to reach out to Dr. Johnsie Cancel about increasing his eplerenone dose but unsure if that was done. BMET showed normal K and Scr since starting eplerenone. ? ?Pt presents today for HTN clinic. Pt reports med compliance and only took his atenolol prior to visit (usually takes BP meds around 10 AM). Stated his BP increased after stopping the spironolactone, but the gynecomastia has went away. Denies dizziness, lightheadedness, blurred vision, or HA. He is taking eplerenone but does not remember what dose he is on but takes it once daily. Does check BP (arm cuff) at home in the mornings prior to taking his meds.  ? ?Home BP readings (April): readings are before he takes his medications unless otherwise noted - 121/76, 135/80, 141/87, 116/73 (after meds), 123/77, 140/85, 133/76, 143/84, 139/85 (after meds), 137/76 ? ?Current HTN meds: losartan 100 mg daily, atenolol 12.5 mg daily (only takes when his HR is >60 bpm), eplerenone (unknown dose and frequency) ?Previously tried: valsartan, HCTZ, amlodipine (stopped d/t low BP) ?Intolerances: terazosin - rash when in sun, lisinopril - rash and itching, enalapril - rash, spironolactone - gynecomastia ? ?BP goal: <130/80 mmHg ? ?Family  History: Father - heart disease. Mother - heart disease, CHF. Brother - HTN. Sister(s) - HTN, heart murmur ? ?Social History: Denies illicit drug use. Used to smoke, quit 45 years ago. Reports 7-14 cans of beer per week. ? ?Exercise: Gym 3x per week - stationary bike and light weight training ? ?Wt Readings from Last 3 Encounters:  ?03/01/22 194 lb (88 kg)  ?02/04/22 194 lb (88 kg)  ?10/04/21 190 lb (86.2 kg)  ? ?BP Readings from Last 3 Encounters:  ?03/01/22 (!) 169/89  ?02/04/22 (!) 160/90  ?10/04/21 129/80  ? ?Pulse Readings from Last 3 Encounters:  ?03/01/22 (!) 57  ?02/04/22 60  ?10/04/21 67  ? ? ?Renal function: ?CrCl cannot be calculated (Patient's most recent lab result is older than the maximum 21 days allowed.). ? ?Past Medical History:  ?Diagnosis Date  ? Allergic rhinitis   ? Anxiety   ? Arthritis   ? BPH (benign prostatic hyperplasia)   ? Carotid artery disease ([place])   ? [place] 9/21: bilat ICA 1-39   ? Coronary artery disease cardiologist--- dr Johnsie Cancel  ? nuclear stress test showed low risk no ischemia 05-29-2020;  coronary CTA 07-21-2020 cal score 3159 with disease;  cath 08-07-2020 severe multiple cad;  08-26-2020 S/p CABG x4  and MAZE procedure  LIMA -LAD, SVG -Dx,  SVG -RI, SVG -OM)  ? Depression   ? Diverticular disease   ? Dyspnea on exertion   ? Echocardiogram   ? Echocardiogram 12/21: EF 60-65, no RWMA, mod asymmetric basal-septal LVH, normal diastolic fn, normal RVSF, RVSP 26, mild MR, mild AV sclerosis (no AS), dilated aortic root (43 mm), dilated ascending aorta (43 mm), no effusion   ? ETOH abuse   ? First degree heart block   ? GERD (gastroesophageal reflux disease)   ? Gout   ? per pt last episode 2019  ? Hemorrhoids   ? Hiatal hernia   ? History of 2019 novel coronavirus disease (COVID-19)   ? 11-27-2019 positive result in care everywhere, per pt mild symptoms that resolved  ? HTN (hypertension)   ? Hyperlipidemia 09/10/2020  ? Low testosterone   ? PAF (paroxysmal atrial fibrillation) [facility])  cardiologist--- dr Johnsie Cancel--- first dx 08/ 2019  ? failed DCCV in 6/21 x2   // Flecainide Rx >>DC'd 2/2 CAD // s/p Maze + LAA clipping in 9/21 // Apixaban  ? Palpitations   ? PTSD (post-traumatic stress disorder)   ? [name] - [employer]  ? Right inguinal hernia   ? S/P Maze operation for atrial fibrillation 08/26/2020  ? at same time cabg  ? ? ?Current Outpatient Medications on File Prior to Visit  ?Medication Sig Dispense Refill  ? acetaminophen (TYLENOL) 500 MG tablet Take 1,000 mg by mouth daily as needed for moderate pain or headache.    ? albuterol (VENTOLIN HFA) 108 (90 Base) MCG/ACT inhaler Inhale 2 puffs into the lungs every 6 (six) hours as needed for wheezing or shortness of breath. 8 g 0  ? ALPRAZolam (XANAX) 0.5 MG tablet Take 0.25 mg by mouth at bedtime.     ? apixaban (ELIQUIS) 5 MG TABS tablet Take 1 tablet (5 mg total) by mouth 2 (two) times daily. 60 tablet   ? atenolol (TENORMIN) 25 MG tablet Take 0.5 tablets (12.5 mg total) by mouth daily. 45 tablet 3  ? atorvastatin (LIPITOR) 40 MG tablet Take 1 tablet (40 mg total) by mouth daily. 30 tablet 11  ? benzonatate (TESSALON PERLES) 100 MG capsule Take 1 capsule (100 mg total) by mouth 3 (three) times daily as needed. 20 capsule 0  ? Cholecalciferol (VITAMIN D) 50 MCG (2000 UT) tablet Take 2,000 Units by mouth daily.    ? Cyanocobalamin (B-12 PO) Take 2 tablets by mouth 2 (two) times daily.    ? fluticasone (FLONASE) 50 MCG/ACT nasal spray Place 2 sprays into both nostrils 2 (two) times daily as needed for allergies or rhinitis.    ? isosorbide mononitrate (IMDUR) 30 MG 24 hr tablet Take 1 tablet (30 mg total) by mouth daily. 90 tablet 3  ? losartan (COZAAR) 100 MG tablet Take 1 tablet (100 mg total) by mouth daily. 90 tablet 3  ? nitroGLYCERIN (NITROSTAT) 0.4 MG SL tablet nitroglycerin 0.4 mg sublingual tablet DISSOLVE ONE TABLET UNDER THE TONGUE EVERY 5 MINUTES AS NEEDED FOR CHEST PAIN. DO NOT EXCEED A TOTAL OF 3 DOSES IN 15 MINUTES 25 tablet 7  ?  omeprazole (PRILOSEC) 20 MG capsule Take 20 mg by mouth 2 (two) times daily as needed (for heartburn / acid reflux).     ? QUEtiapine (SEROQUEL) 100 MG tablet Take 25 mg by mouth at bedtime. Pt only takes 1/4 tab    ? tamsulosin (FLOMAX) 0.4 MG CAPS capsule TAKE 1 CAPSULE BY MOUTH ONCE DAILY AFTER BREAKFAST 90 capsule 0  ? ?  No current facility-administered medications on file prior to visit.  ? ? ?Allergies  ?Allergen Reactions  ? Terazosin Rash  ?  Rash when in sun  ? Enalapril Maleate Rash  ? Lisinopril Itching and Rash  ? Penicillins Itching and Rash  ?  35 years ago ?  ? Adhesive [Tape] Rash  ?  bandaids   ? ?Assessment/Plan: ? ?1. Hypertension - BP 156/90 above goal <130/80 mmHg. Continue losartan 100 mg daily, atenolol 12.5 mg daily, and eplerenone. BP is high in clinic, but pt has not taken medication today and home BP readings are close to goal when he checks before taking meds. Told pt to start checking his BP around 30 mins to 1 hr after taking his medications. Also told him to bring in home cuff + readings and eplerenone dose to next visit so we can update his med list. Discussed taking his meds prior to next visit. Will recheck pt's BP at 04/26/22 appointment after he makes these changes to see if BP is at goal, both at home and in clinic, before making any medication changes.  ? ?Follow-up for HTN on 04/26/22. ? ?Patient seen with Debria Garret, [place] pharmacy student ? ?Rebbeca Paul, PharmD ?PGY2- [place] resident ? ? ?Ramond Dial, Pharm.D, BCPS, CPP ?[id number] [place], [place] [address]  ?Phone: [phone]; Fax: [phone]  ? ? ?

## 2022-03-29 ENCOUNTER — Ambulatory Visit (INDEPENDENT_AMBULATORY_CARE_PROVIDER_SITE_OTHER): Payer: Medicare Other | Admitting: Student-PharmD

## 2022-03-29 VITALS — BP 156/90 | HR 59

## 2022-03-29 DIAGNOSIS — I10 Essential (primary) hypertension: Secondary | ICD-10-CM | POA: Diagnosis not present

## 2022-03-29 NOTE — Patient Instructions (Addendum)
Your blood pressure goal < 130/80 mmHg ? ?Bring in home blood pressure cuff + readings to next visit. Start checking your blood pressure around 30 minutes to 1 hour after taking blood pressure medications. ? ?Bring in eplerenone to next visit so we can update dose on medication list.  ? ?Take blood pressure medications before next visit ? ?Continue losartan 100 mg daily, atenolol 12.5 mg daily, and eplerenone ? ?Will follow-up in 4 weeks on 04/26/2022 ?

## 2022-03-29 NOTE — Progress Notes (Signed)
Error

## 2022-04-19 NOTE — Progress Notes (Signed)
Patient ID: Joseph Larsen                 DOB: December 24, 1945                      MRN: [id number] ? ? ? ? ?HPI: ?Joseph Larsen is a 76 y.o. male referred by Dr. Johnsie Cancel to HTN clinic. PMH is significant for CAD s/p CABG in 08/2020, AF, HTN, HLD, and hx of alcohol abuse. Pt saw Dr. Johnsie Cancel on 02/08/22 for routine f/u after CABG in 2021. BP was elevated to 160/90. Pt reported gynecomastia on spironolactone which was d/c'd and no other med changes made. [facility] wanted to start eplerenone and Dr. Johnsie Cancel ok'd him to start this on 02/23/22. Pt saw Dr. Lajuana Ripple on 03/01/22 and BP was 169/89 in clinic. Also brought his home log to that visit which showed systolics anywhere from the 140s-160s/80s-90s. BMET showed normal K and Scr since starting eplerenone. He was seen by HTN clinic 03/29/22 and BP was 156/90 but he had not taken his medications yet for the day and was not sure what dose of eplerenone he was taking, so current medications were continued. He reported improvements in gynecomastia since discontinuing spironolactone.  ? ?Today, patient arrives in good spirits accompanied by his wife. He brings his medications and home BP cuff. He has taken his medications this morning and denies missed doses. Denies adverse effects. Denies dizziness, headache, blurred vision, swelling. He has been more active since last visit working on his land. He feels that his BP has improved over the last month.  ? ?Validated home BP cuff today (04/26/22):  ?Home cuff: 127/77, HR 63 ?Clinic cuff: 122/70, HR 63 ? ?Home BP readings: Uses upper arm Omron cuff. Validated 04/26/22 to read very similarly to clinic cuff (home cuff was ~5 mmHg higher systolic and ~7 mmHg higher diastolic). Recent readings: 137/89, 156/93, 129/82, 125/78, 140/77, 109/79, 137/84, 148/98, 153/85, 158/91, 140/88, 140/89, 132/89, 141/82, 122/78, 117/73, 125/77, 120/78, 140/88  ? ?Current HTN meds: losartan 100 mg daily, eplerenone 25 mg daily, atenolol 12.5 mg daily (only takes  when HR is >60 bpm) ?Previously tried: valsartan, HCTZ, amlodipine (stopped d/t low BP) ?Intolerances: terazosin - rash when in sun, lisinopril - rash and itching, enalapril - rash, spironolactone - gynecomastia ? ?BP goal: <130/80 mmHg ? ?Family History: Father - heart disease. Mother - heart disease, CHF. Brother - HTN. Sister(s) - HTN, heart murmur ? ?Social History: Denies illicit drug use. Used to smoke, quit 45 years ago. Reports 7-14 cans of beer per week. ? ?Exercise: Gym 3x per week - stationary bike and light weight training; working more in the yard lately clearing land ? ?Wt Readings from Last 3 Encounters:  ?03/01/22 194 lb (88 kg)  ?02/04/22 194 lb (88 kg)  ?10/04/21 190 lb (86.2 kg)  ? ?BP Readings from Last 3 Encounters:  ?03/29/22 (!) 156/90  ?03/01/22 (!) 169/89  ?02/04/22 (!) 160/90  ? ?Pulse Readings from Last 3 Encounters:  ?03/29/22 (!) 59  ?03/01/22 (!) 57  ?02/04/22 60  ? ?Renal function: ?CrCl cannot be calculated (Patient's most recent lab result is older than the maximum 21 days allowed.). ? ?Past Medical History:  ?Diagnosis Date  ? Allergic rhinitis   ? Anxiety   ? Arthritis   ? BPH (benign prostatic hyperplasia)   ? Carotid artery disease ([place])   ? [place] 9/21: bilat ICA 1-39   ? Coronary artery disease cardiologist--- dr  nishan  ? nuclear stress test showed low risk no ischemia 05-29-2020;  coronary CTA 07-21-2020 cal score 3159 with disease;  cath 08-07-2020 severe multiple cad;  08-26-2020 S/p CABG x4  and MAZE procedure  LIMA -LAD, SVG -Dx, SVG -RI, SVG -OM)  ? Depression   ? Diverticular disease   ? Dyspnea on exertion   ? Echocardiogram   ? Echocardiogram 12/21: EF 60-65, no RWMA, mod asymmetric basal-septal LVH, normal diastolic fn, normal RVSF, RVSP 26, mild MR, mild AV sclerosis (no AS), dilated aortic root (43 mm), dilated ascending aorta (43 mm), no effusion   ? ETOH abuse   ? First degree heart block   ? GERD (gastroesophageal reflux disease)   ? Gout   ? per pt last episode  2019  ? Hemorrhoids   ? Hiatal hernia   ? History of 2019 novel coronavirus disease (COVID-19)   ? 11-27-2019 positive result in care everywhere, per pt mild symptoms that resolved  ? HTN (hypertension)   ? Hyperlipidemia 09/10/2020  ? Low testosterone   ? PAF (paroxysmal atrial fibrillation) [facility]) cardiologist--- dr Johnsie Cancel--- first dx 08/ 2019  ? failed DCCV in 6/21 x2   // Flecainide Rx >>DC'd 2/2 CAD // s/p Maze + LAA clipping in 9/21 // Apixaban  ? Palpitations   ? PTSD (post-traumatic stress disorder)   ? [name]  ? Right inguinal hernia   ? S/P Maze operation for atrial fibrillation 08/26/2020  ? at same time cabg  ? ?Current Outpatient Medications on File Prior to Visit  ?Medication Sig Dispense Refill  ? acetaminophen (TYLENOL) 500 MG tablet Take 1,000 mg by mouth daily as needed for moderate pain or headache.    ? albuterol (VENTOLIN HFA) 108 (90 Base) MCG/ACT inhaler Inhale 2 puffs into the lungs every 6 (six) hours as needed for wheezing or shortness of breath. 8 g 0  ? ALPRAZolam (XANAX) 0.5 MG tablet Take 0.25 mg by mouth at bedtime.     ? apixaban (ELIQUIS) 5 MG TABS tablet Take 1 tablet (5 mg total) by mouth 2 (two) times daily. 60 tablet   ? atenolol (TENORMIN) 25 MG tablet Take 0.5 tablets (12.5 mg total) by mouth daily. 45 tablet 3  ? atorvastatin (LIPITOR) 40 MG tablet Take 1 tablet (40 mg total) by mouth daily. 30 tablet 11  ? benzonatate (TESSALON PERLES) 100 MG capsule Take 1 capsule (100 mg total) by mouth 3 (three) times daily as needed. 20 capsule 0  ? Cholecalciferol (VITAMIN D) 50 MCG (2000 UT) tablet Take 2,000 Units by mouth daily.    ? Cyanocobalamin (B-12 PO) Take 2 tablets by mouth 2 (two) times daily.    ? EPLERENONE PO Take 1 tablet by mouth daily. Pt does not know dose    ? fluticasone (FLONASE) 50 MCG/ACT nasal spray Place 2 sprays into both nostrils 2 (two) times daily as needed for allergies or rhinitis.    ? isosorbide mononitrate (IMDUR) 30 MG 24 hr tablet Take 1  tablet (30 mg total) by mouth daily. 90 tablet 3  ? losartan (COZAAR) 100 MG tablet Take 1 tablet (100 mg total) by mouth daily. 90 tablet 3  ? nitroGLYCERIN (NITROSTAT) 0.4 MG SL tablet nitroglycerin 0.4 mg sublingual tablet DISSOLVE ONE TABLET UNDER THE TONGUE EVERY 5 MINUTES AS NEEDED FOR CHEST PAIN. DO NOT EXCEED A TOTAL OF 3 DOSES IN 15 MINUTES 25 tablet 7  ? omeprazole (PRILOSEC) 20 MG capsule Take 20 mg by  mouth 2 (two) times daily as needed (for heartburn / acid reflux).     ? QUEtiapine (SEROQUEL) 100 MG tablet Take 25 mg by mouth at bedtime. Pt only takes 1/4 tab    ? tamsulosin (FLOMAX) 0.4 MG CAPS capsule TAKE 1 CAPSULE BY MOUTH ONCE DAILY AFTER BREAKFAST 90 capsule 0  ? ?No current facility-administered medications on file prior to visit.  ? ?Allergies  ?Allergen Reactions  ? Terazosin Rash  ?  Rash when in sun  ? Enalapril Maleate Rash  ? Lisinopril Itching and Rash  ? Penicillins Itching and Rash  ?  35 years ago ?  ? Adhesive [Tape] Rash  ?  bandaids   ? ?Assessment/Plan: ? ?1. Hypertension - BP in clinic today of 122/70 is at goal <130/80 mmHg. Home cuff was validated to read accurately with clinic cuff. Home readings are variable but higher readings seem to be before patient has taken medication for the day. Patient also admits to these readings being when he is more stressed or after he has worked in the yard and does not adequately rest. Given both home and clinic cuff are at goal today after he has taken medications and with adequate rest, will continue current medications: losartan 100 mg daily, eplerenone 25 mg daily, and atenolol 12.5 mg daily. Counseled patient to rest longer after he has been exercising before taking BP at home and to call the clinic if his BP is consistently elevated or if he experiences symptoms of elevated BP. Follow up with HTN clinic as needed.  ? ?Rebbeca Paul, PharmD ?PGY2 Ambulatory Care Pharmacy Resident ?04/26/2022 9:48 AM ?

## 2022-04-26 ENCOUNTER — Ambulatory Visit (INDEPENDENT_AMBULATORY_CARE_PROVIDER_SITE_OTHER): Payer: Medicare Other | Admitting: Student-PharmD

## 2022-04-26 VITALS — BP 122/70 | HR 63

## 2022-04-26 DIAGNOSIS — I10 Essential (primary) hypertension: Secondary | ICD-10-CM

## 2022-04-26 NOTE — Patient Instructions (Signed)
It was nice to see you today! ? ?Your goal blood pressure is less than 130/80 mmHg. In clinic, your blood pressure was 122/70 mmHg. ? ?Medication Changes: ? ?Continue your current medications.  ? ?Monitor blood pressure at home daily and keep a log (on your phone or piece of paper) to bring with you to your next visit. Write down date, time, blood pressure and pulse. ? ?Keep up the good work with diet and exercise. Aim for a diet full of vegetables, fruit and lean meats (chicken, [place], fish). Try to limit salt intake by eating fresh or frozen vegetables (instead of canned), rinse canned vegetables prior to cooking and do not add any additional salt to meals.  ? ?Call if your BP starts to go up consistently (after resting and taking medications) or if you have signs of high BP (headache, blurred vision, dizziness).  ? ? ? ? ? ?

## 2022-05-03 NOTE — Progress Notes (Signed)
Cardiology Office Note:    Date:  05/12/2022   ID:  Joseph Larsen, DOB [birth year], MRN [id number]  PCP:  Joseph Norlander, DO  [place] Cardiologist:  Joseph Rouge, MD     Patient Profile:    Joseph Larsen is a 76 y.o. 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 Cyndia Larsen; L-LAD, S-Dx, S-RI, S-OM) Paroxysmal >> persistent atrial fibrillation S/p DCCV 05/15/20 >> ERAF Flecainide started 6/21>>DCd 2/2 coronary artery disease  ETT-Myoview - no ischemia; no VT DCCV 06/01/20 >> NSR S/p bi-atrial MAZE and LAA clipping 9/21 Hypertension Hyperlipidemia Alcohol abuse Echo 6/19: EF 60-65 Echo 5/21: EF 55-60, LAE, LVH Echo 12/07/20 EF 60-65% aortic root 4.3 m Carotid artery Dz  [place] 9/21: bilat ICA 1-39      Prior CV studies: Carotid US 08/20/20 B/L ICA 1-39   Cardiac catheterization 08/07/20 LM mid 50 LAD prox 90 RI 90 LCx ost 70, mid 50 RCA prox 30 EF 55-65   Coronary CTA 07/21/20 Ca score 3159 (95th percentile) 1. Left Main:  No significant stenosis. FFR = 0.89 2. LAD: Significant stenosis. FFR 0.76 at takeoff of first diagonal, Mid FFR = 0.66, Distal FFR = 0.58 3. LCX: No significant stenosis. Proximal FFR = 0.86 4. RCA: Unable to be interpreted due to artifact. IMPRESSION: 1. CT FFR analysis showed significant stenosis in the LAD as noted, beginning near the takeoff of the first significant diagonal and extending distally.   ETT-Myoview 05/29/2020 Small inferior apical wall infarct, no ischemia, not gated due to atrial fibrillation, low risk ETT portion normal   Event monitor 05/04/2020 Atrial fibrillation, average heart rate 81, 3.1-second pause, <1% PVCs   Echocardiogram 04/23/2020 EF 55-60, no RWMA, mild LVH, normal RV SF, severe LAE, mildly dilated aortic root (39 mm), dilated ascending aorta (40 mm)  History of Present Illness:    76 y.o. f/u CAD/CABG August 26 2020 see above Has done well post op echo with normal EF and aortic root 4.3 cm CTA  07/26/21 aorta only 3.8 cm He has had PAF and been on flecainide but this was stopped when his CAD diagnosed Has been on eliquis Had MAZE And LAA clipping during CABG   Recovering well Sold his Joseph Larsen No palpitations and no bleeding issues on eliquis   Had gynecomastia likely from aldactone d/c and changed to inspra. Has f/u with BP clinic and pressures good on current regimen  He knows when he has had PAF in past He does not like to be on ASA/Eliquis as he bruises too easily Discussed fact that ASA more important for grafts. He usually knows when he's in afib and we discussed Kartia monitor   Past Medical History:  Diagnosis Date   Allergic rhinitis    Anxiety    Arthritis    BPH (benign prostatic hyperplasia)    Carotid artery disease ([place])    [place] 9/21: bilat ICA 1-39    Coronary artery disease cardiologist--- dr Joseph Larsen   nuclear stress test showed low risk no ischemia 05-29-2020;  coronary CTA 07-21-2020 cal score 3159 with disease;  cath 08-07-2020 severe multiple cad;  08-26-2020 S/p CABG x4  and MAZE procedure  LIMA -LAD, SVG -Dx, SVG -RI, SVG -OM)   Depression    Diverticular disease    Dyspnea on exertion    Echocardiogram    Echocardiogram 12/21: EF 60-65, no RWMA, mod asymmetric basal-septal LVH, normal diastolic fn, normal RVSF, RVSP 26, mild MR, mild AV sclerosis (no  AS), dilated aortic root (43 mm), dilated ascending aorta (43 mm), no effusion    ETOH abuse    First degree heart block    GERD (gastroesophageal reflux disease)    Gout    per pt last episode 2019   Hemorrhoids    Hiatal hernia    History of 2019 novel coronavirus disease (COVID-19)    11-27-2019 positive result in care everywhere, per pt mild symptoms that resolved   HTN (hypertension)    Hyperlipidemia 09/10/2020   Low testosterone    PAF (paroxysmal atrial fibrillation) [facility]) cardiologist--- dr Joseph Larsen--- first dx 08/ 2019   failed DCCV in 6/21 x2   // Flecainide Rx >>DC'd 2/2 CAD // s/p Maze + LAA  clipping in 9/21 // Apixaban   Palpitations    PTSD (post-traumatic stress disorder)    [name] - [employer]   Right inguinal hernia    S/P Maze operation for atrial fibrillation 08/26/2020   at same time cabg    Current Medications: Current Meds  Medication Sig   acetaminophen (TYLENOL) 500 MG tablet Take 1,000 mg by mouth daily as needed for moderate pain or headache.   ALPRAZolam (XANAX) 0.5 MG tablet Take 0.25 mg by mouth at bedtime.    apixaban (ELIQUIS) 5 MG TABS tablet Take 1 tablet (5 mg total) by mouth 2 (two) times daily.   atenolol (TENORMIN) 25 MG tablet Take 0.5 tablets (12.5 mg total) by mouth daily.   atorvastatin (LIPITOR) 40 MG tablet Take 1 tablet (40 mg total) by mouth daily.   Cholecalciferol (VITAMIN D) 50 MCG (2000 UT) tablet Take 2,000 Units by mouth daily.   Cyanocobalamin (B-12 PO) Take 2 tablets by mouth 2 (two) times daily.   eplerenone (INSPRA) 25 MG tablet Take 25 mg by mouth daily.   fluticasone (FLONASE) 50 MCG/ACT nasal spray Place 2 sprays into both nostrils 2 (two) times daily as needed for allergies or rhinitis.   isosorbide mononitrate (IMDUR) 30 MG 24 hr tablet Take 1 tablet (30 mg total) by mouth daily.   loratadine (CLARITIN) 10 MG tablet Take 10 mg by mouth daily.   losartan (COZAAR) 100 MG tablet Take 1 tablet (100 mg total) by mouth daily.   nitroGLYCERIN (NITROSTAT) 0.4 MG SL tablet nitroglycerin 0.4 mg sublingual tablet DISSOLVE ONE TABLET UNDER THE TONGUE EVERY 5 MINUTES AS NEEDED FOR CHEST PAIN. DO NOT EXCEED A TOTAL OF 3 DOSES IN 15 MINUTES   Omega-3 Fatty Acids (FISH OIL) 1000 MG CAPS Take 1,000 mg by mouth daily.   omeprazole (PRILOSEC) 20 MG capsule Take 20 mg by mouth 2 (two) times daily as needed (for heartburn / acid reflux).    QUEtiapine (SEROQUEL) 100 MG tablet Take 25 mg by mouth at bedtime. Pt only takes 1/4 tab   tamsulosin (FLOMAX) 0.4 MG CAPS capsule TAKE 1 CAPSULE BY MOUTH ONCE DAILY AFTER BREAKFAST     Allergies:   Terazosin,  Enalapril maleate, Lisinopril, Penicillins, and Adhesive [tape]   Social History   Tobacco Use   Smoking status: Former    Packs/day: 3.00    Years: 20.00    Pack years: 60.00    Types: Cigarettes    Start date: 02/09/1965    Quit date: 05/12/1977    Years since quitting: 45.0   Smokeless tobacco: Never  Vaping Use   Vaping Use: Never used  Substance Use Topics   Alcohol use: Not Currently    Alcohol/week: 7.0 - 14.0 standard drinks  Types: 7 - 14 Cans of beer per week    Comment: 03-23-2021 per pt 1 to 2 beers daily (hx alcohol abuse   Drug use: Never     Family Hx: The patient's family history includes Heart disease in his father and mother; Heart murmur in his sister; Hypertension in his brother, sister, sister, sister, and sister; Liver disease in his brother. There is no history of Colon cancer.  ROS   EKGs/Labs/Other Test Reviewed:    EKG:   SR rate 75 11/10/20   Recent Labs: 03/01/2022: BUN 10; Creatinine, Ser 0.81; Potassium 3.5; Sodium 140   Recent Lipid Panel Lab Results  Component Value Date/Time   CHOL 77 (L) 11/18/2020 10:05 AM   TRIG 37 11/18/2020 10:05 AM   TRIG 82 03/25/2015 09:50 AM   HDL 32 (L) 11/18/2020 10:05 AM   HDL 38 (L) 03/25/2015 09:50 AM   CHOLHDL 2.4 11/18/2020 10:05 AM   LDLCALC 34 11/18/2020 10:05 AM   LDLCALC 54 07/17/2014 08:28 AM   LDLDIRECT 74 01/03/2017 11:00 AM      Risk Assessment/Calculations:     CHA2DS2-VASc Score =    This indicates a  % annual risk of stroke. The patient's score is based upon:      Physical Exam:    VS:  BP 134/72   Pulse 61   Ht '5\' 11"'$  (1.803 m)   Wt 189 lb (85.7 kg)   SpO2 96%   BMI 26.36 kg/m     Wt Readings from Last 3 Encounters:  05/12/22 189 lb (85.7 kg)  03/01/22 194 lb (88 kg)  02/04/22 194 lb (88 kg)     Affect appropriate Healthy:  appears stated age 52: normal Neck supple with no adenopathy JVP normal no bruits no thyromegaly Lungs clear with no wheezing and good  diaphragmatic motion Heart:  S1/S2 no murmur, no rub, gallop or click PMI normal post sternotomy  Abdomen: benighn, BS positve, no tenderness, no AAA no bruit.  No HSM or HJR Distal pulses intact with no bruits No edema Neuro non-focal Skin warm and dry No muscular weakness    ASSESSMENT & PLAN:    1. Essential hypertension - improved on current meds including Inspra, atenolol, imdur, cozaar - low sodium diet   2. CAD/CABG. -08/26/20 doing well with preserved EF resume ASA 81 mg  Beta blocker and statin   4. PAF -Maintaining sinus rhythm.  He is status post Maze procedure and atrial appendage clipping.  D/C eliquis Kartia monitoring   5. Hyperlipidemia, unspecified hyperlipidemia type -LDL optimal on most recent lab work.  Continue current Rx.    6. Dilated Aorta:  3.8 cm on CTA 07/26/21 observe   7. Gynecomastia:  :  improved off aldactone      Dispo:  F/U in a year   Medication Adjustments/Labs and Tests Ordered: Current medicines are reviewed at length with the patient today.  Concerns regarding medicines are outlined above.  Tests Ordered: No orders of the defined types were placed in this encounter.  Medication Changes: No orders of the defined types were placed in this encounter.   Signed, Joseph Rouge, MD  05/12/2022 10:23 AM    [facility] [address], [place], [place]  [address] Phone: [phone]; Fax: [phone]

## 2022-05-12 ENCOUNTER — Encounter: Payer: Self-pay | Admitting: Cardiovascular Disease

## 2022-05-12 ENCOUNTER — Ambulatory Visit (INDEPENDENT_AMBULATORY_CARE_PROVIDER_SITE_OTHER): Payer: Medicare Other | Admitting: Cardiovascular Disease

## 2022-05-12 VITALS — BP 134/72 | HR 61 | Ht 71.0 in | Wt 189.0 lb

## 2022-05-12 DIAGNOSIS — Z951 Presence of aortocoronary bypass graft: Secondary | ICD-10-CM

## 2022-05-12 DIAGNOSIS — I480 Paroxysmal atrial fibrillation: Secondary | ICD-10-CM

## 2022-05-12 DIAGNOSIS — I10 Essential (primary) hypertension: Secondary | ICD-10-CM | POA: Diagnosis not present

## 2022-05-12 DIAGNOSIS — N62 Hypertrophy of breast: Secondary | ICD-10-CM | POA: Diagnosis not present

## 2022-05-12 DIAGNOSIS — I25119 Atherosclerotic heart disease of native coronary artery with unspecified angina pectoris: Secondary | ICD-10-CM

## 2022-05-12 DIAGNOSIS — I712 Thoracic aortic aneurysm, without rupture, unspecified: Secondary | ICD-10-CM

## 2022-05-12 MED ORDER — ASPIRIN 81 MG PO TBEC: 81.0000 mg | DELAYED_RELEASE_TABLET | Freq: Every day | ORAL | 3 refills | Status: AC

## 2022-05-12 NOTE — Patient Instructions (Addendum)
Medication Instructions:  Your physician has recommended you make the following change in your medication:  1 STOP eliquis 2 START Aspirin 81 mg by mouth daily.  *If you need a refill on your cardiac medications before your next appointment, please call your pharmacy*  Lab Work: If you have labs (blood work) drawn today and your tests are completely normal, you will receive your results only by: [place] (if you have MyChart) OR A paper copy in the mail If you have any lab test that is abnormal or we need to change your treatment, we will call you to review the results.  Testing/Procedures: None ordered today.  Follow-Up: At [facility], you and your health needs are our priority.  As part of our continuing mission to provide you with exceptional heart care, we have created designated Provider Care Teams.  These Care Teams include your primary Cardiologist (physician) and Advanced Practice Providers (APPs -  Physician Assistants and Nurse Practitioners) who all work together to provide you with the care you need, when you need it.  We recommend signing up for the patient portal called "MyChart".  Sign up information is provided on this After Visit Summary.  MyChart is used to connect with patients for Virtual Visits (Telemedicine).  Patients are able to view lab/test results, encounter notes, upcoming appointments, etc.  Non-urgent messages can be sent to your provider as well.   To learn more about what you can do with MyChart, go to [email or web address].    Your next appointment:   6 months  The format for your next appointment:   In Person  Provider:   Jenkins Rouge, MD {  Important Information About Sugar

## 2022-06-27 DIAGNOSIS — D0462 Carcinoma in situ of skin of left upper limb, including shoulder: Secondary | ICD-10-CM | POA: Diagnosis not present

## 2022-06-27 DIAGNOSIS — Z85828 Personal history of other malignant neoplasm of skin: Secondary | ICD-10-CM | POA: Diagnosis not present

## 2022-06-27 DIAGNOSIS — L821 Other seborrheic keratosis: Secondary | ICD-10-CM | POA: Diagnosis not present

## 2022-06-27 DIAGNOSIS — L570 Actinic keratosis: Secondary | ICD-10-CM | POA: Diagnosis not present

## 2022-06-27 DIAGNOSIS — L814 Other melanin hyperpigmentation: Secondary | ICD-10-CM | POA: Diagnosis not present

## 2022-07-14 ENCOUNTER — Other Ambulatory Visit: Payer: Self-pay | Admitting: *Deleted

## 2022-07-14 DIAGNOSIS — Z79899 Other long term (current) drug therapy: Secondary | ICD-10-CM

## 2022-07-14 DIAGNOSIS — I712 Thoracic aortic aneurysm, without rupture, unspecified: Secondary | ICD-10-CM

## 2022-08-04 ENCOUNTER — Other Ambulatory Visit (HOSPITAL_BASED_OUTPATIENT_CLINIC_OR_DEPARTMENT_OTHER): Payer: Medicare Other

## 2022-08-10 ENCOUNTER — Ambulatory Visit: Payer: Medicare Other | Attending: Physician Assistant

## 2022-08-10 DIAGNOSIS — Z79899 Other long term (current) drug therapy: Secondary | ICD-10-CM | POA: Insufficient documentation

## 2022-08-10 LAB — BASIC METABOLIC PANEL
BUN/Creatinine Ratio: 14 (ref 10–24)
BUN: 12 mg/dL (ref 8–27)
CO2: 26 mmol/L (ref 20–29)
Calcium: 9.1 mg/dL (ref 8.6–10.2)
Chloride: 103 mmol/L (ref 96–106)
Creatinine, Ser: 0.88 mg/dL (ref 0.76–1.27)
Glucose: 90 mg/dL (ref 70–99)
Potassium: 4 mmol/L (ref 3.5–5.2)
Sodium: 140 mmol/L (ref 134–144)
eGFR: 90 mL/min/{1.73_m2} (ref >59)

## 2022-08-17 ENCOUNTER — Ambulatory Visit (HOSPITAL_BASED_OUTPATIENT_CLINIC_OR_DEPARTMENT_OTHER)
Admission: RE | Admit: 2022-08-17 | Discharge: 2022-08-17 | Disposition: A | Discharge status: Home / Self Care | Payer: Medicare Other | Source: Ambulatory Visit | Attending: Physician Assistant | Admitting: Physician Assistant

## 2022-08-17 ENCOUNTER — Encounter: Payer: Self-pay | Admitting: Physician Assistant

## 2022-08-17 DIAGNOSIS — I712 Thoracic aortic aneurysm, without rupture, unspecified: Secondary | ICD-10-CM | POA: Diagnosis not present

## 2022-08-17 DIAGNOSIS — J432 Centrilobular emphysema: Secondary | ICD-10-CM | POA: Diagnosis not present

## 2022-08-17 DIAGNOSIS — I77810 Thoracic aortic ectasia: Secondary | ICD-10-CM

## 2022-08-17 HISTORY — DX: Thoracic aortic ectasia: I77.810

## 2022-08-17 MED ORDER — IOHEXOL 350 MG/ML SOLN
100.0000 mL | Freq: Once | INTRAVENOUS | Status: AC | PRN
  Administered 2022-08-17: 75 mL via INTRAVENOUS

## 2022-08-18 ENCOUNTER — Telehealth: Payer: Self-pay | Admitting: *Deleted

## 2022-08-18 DIAGNOSIS — I712 Thoracic aortic aneurysm, without rupture, unspecified: Secondary | ICD-10-CM

## 2022-08-18 NOTE — Telephone Encounter (Signed)
-----   Message from Liliane Shi, [place] sent at 08/17/2022  5:06 PM EDT ----- Aorta size is overall stable by CT scan PLAN:  -Continue current medications/treatment plan and follow up as scheduled.  -Arrange follow-up CT in 1 year -Send copy to PCP Richardson Dopp, PA-C    08/17/2022 5:04 PM

## 2022-09-12 DIAGNOSIS — K2950 Unspecified chronic gastritis without bleeding: Secondary | ICD-10-CM | POA: Diagnosis not present

## 2022-09-12 DIAGNOSIS — E785 Hyperlipidemia, unspecified: Secondary | ICD-10-CM | POA: Diagnosis not present

## 2022-09-12 DIAGNOSIS — F32A Depression, unspecified: Secondary | ICD-10-CM | POA: Diagnosis not present

## 2022-09-12 DIAGNOSIS — K219 Gastro-esophageal reflux disease without esophagitis: Secondary | ICD-10-CM | POA: Diagnosis not present

## 2022-09-12 DIAGNOSIS — I10 Essential (primary) hypertension: Secondary | ICD-10-CM | POA: Diagnosis not present

## 2022-09-12 DIAGNOSIS — Z87891 Personal history of nicotine dependence: Secondary | ICD-10-CM | POA: Diagnosis not present

## 2022-09-12 DIAGNOSIS — K2970 Gastritis, unspecified, without bleeding: Secondary | ICD-10-CM | POA: Diagnosis not present

## 2022-09-12 DIAGNOSIS — I4891 Unspecified atrial fibrillation: Secondary | ICD-10-CM | POA: Diagnosis not present

## 2022-09-12 DIAGNOSIS — F4310 Post-traumatic stress disorder, unspecified: Secondary | ICD-10-CM | POA: Diagnosis not present

## 2022-09-12 DIAGNOSIS — Z7901 Long term (current) use of anticoagulants: Secondary | ICD-10-CM | POA: Diagnosis not present

## 2022-09-12 DIAGNOSIS — K3189 Other diseases of stomach and duodenum: Secondary | ICD-10-CM | POA: Diagnosis not present

## 2022-09-12 DIAGNOSIS — M1990 Unspecified osteoarthritis, unspecified site: Secondary | ICD-10-CM | POA: Diagnosis not present

## 2022-09-12 DIAGNOSIS — K31A19 Gastric intestinal metaplasia without dysplasia, unspecified site: Secondary | ICD-10-CM | POA: Diagnosis not present

## 2022-09-12 DIAGNOSIS — Z7982 Long term (current) use of aspirin: Secondary | ICD-10-CM | POA: Diagnosis not present

## 2022-09-12 DIAGNOSIS — Z79899 Other long term (current) drug therapy: Secondary | ICD-10-CM | POA: Diagnosis not present

## 2022-09-12 DIAGNOSIS — F419 Anxiety disorder, unspecified: Secondary | ICD-10-CM | POA: Diagnosis not present

## 2022-11-01 DIAGNOSIS — Z23 Encounter for immunization: Secondary | ICD-10-CM | POA: Diagnosis not present

## 2022-11-07 NOTE — Progress Notes (Signed)
Cardiology Office Note:    Date:  11/21/2022   ID:  Joseph Larsen, DOB [birth year], MRN [id number]  PCP:  [name], DO  [place] Cardiologist:  Jenkins Rouge, MD     Patient Profile:    Joseph Larsen is a 76 y.o. 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 Cyndia Bent; L-LAD, S-Dx, S-RI, S-OM) Paroxysmal >> persistent atrial fibrillation S/p DCCV 05/15/20 >> ERAF Flecainide started 6/21>>DCd 2/2 coronary artery disease  ETT-Myoview - no ischemia; no VT DCCV 06/01/20 >> NSR S/p bi-atrial MAZE and LAA clipping 9/21 Hypertension Hyperlipidemia Alcohol abuse Echo 6/19: EF 60-65 Echo 5/21: EF 55-60, LAE, LVH Echo 12/07/20 EF 60-65% aortic root 4.3 m Carotid artery Dz  [place] 9/21: bilat ICA 1-39      Prior CV studies: Carotid US 08/20/20 B/L ICA 1-39   Cardiac catheterization 08/07/20 LM mid 50 LAD prox 90 RI 90 LCx ost 70, mid 50 RCA prox 30 EF 55-65   Coronary CTA 07/21/20 Ca score 3159 (95th percentile) 1. Left Main:  No significant stenosis. FFR = 0.89 2. LAD: Significant stenosis. FFR 0.76 at takeoff of first diagonal, Mid FFR = 0.66, Distal FFR = 0.58 3. LCX: No significant stenosis. Proximal FFR = 0.86 4. RCA: Unable to be interpreted due to artifact. IMPRESSION: 1. CT FFR analysis showed significant stenosis in the LAD as noted, beginning near the takeoff of the first significant diagonal and extending distally.   ETT-Myoview 05/29/2020 Small inferior apical wall infarct, no ischemia, not gated due to atrial fibrillation, low risk ETT portion normal   Event monitor 05/04/2020 Atrial fibrillation, average heart rate 81, 3.1-second pause, <1% PVCs   Echocardiogram 04/23/2020 EF 55-60, no RWMA, mild LVH, normal RV SF, severe LAE, mildly dilated aortic root (39 mm), dilated ascending aorta (40 mm)  History of Present Illness:    76 y.o. f/u CAD/CABG August 26 2020 see above Has done well post op echo with normal EF and aortic root 4.3 cm CTA  07/26/21 aorta only 3.8 cm He has had PAF and been on flecainide but this was stopped when his CAD diagnosed Has been on eliquis Had MAZE And LAA clipping during CABG   Sold his Harley No palpitations and no bleeding issues on eliquis   Had gynecomastia likely from aldactone d/c and changed to inspra. Has f/u with BP clinic and pressures good on current regimen  He knows when he has had PAF in past He does not like to be on ASA/Eliquis as he bruises too easily Discussed fact that ASA more important for grafts. He usually knows when he's in afib and we discussed Kardia monitor   CTA 08/17/22 Ascending thoracic aorta only 3.5 cm   Like to make wild [employer]   Past Medical History:  Diagnosis Date   Allergic rhinitis    Anxiety    Arthritis    Ascending aorta dilation ([facility]) 08/17/2022   CT in 07/2021: 3.8 cm // Chest/aorta CTA: Ascending aorta 3.5 cm, descending aorta 3.5 cm   BPH (benign prostatic hyperplasia)    Carotid artery disease ([place])    [place] 9/21: bilat ICA 1-39    Coronary artery disease cardiologist--- dr Johnsie Cancel   nuclear stress test showed low risk no ischemia 05-29-2020;  coronary CTA 07-21-2020 cal score 3159 with disease;  cath 08-07-2020 severe multiple cad;  08-26-2020 S/p CABG x4  and MAZE procedure  LIMA -LAD, SVG -Dx, SVG -RI, SVG -OM)   Depression  Diverticular disease    Dyspnea on exertion    Echocardiogram    Echocardiogram 12/21: EF 60-65, no RWMA, mod asymmetric basal-septal LVH, normal diastolic fn, normal RVSF, RVSP 26, mild MR, mild AV sclerosis (no AS), dilated aortic root (43 mm), dilated ascending aorta (43 mm), no effusion    ETOH abuse    First degree heart block    GERD (gastroesophageal reflux disease)    Gout    per pt last episode 2019   Hemorrhoids    Hiatal hernia    History of 2019 novel coronavirus disease (COVID-19)    11-27-2019 positive result in care everywhere, per pt mild symptoms that resolved   HTN (hypertension)     Hyperlipidemia 09/10/2020   Low testosterone    PAF (paroxysmal atrial fibrillation) [facility]) cardiologist--- dr Johnsie Cancel--- first dx 08/ 2019   failed DCCV in 6/21 x2   // Flecainide Rx >>DC'd 2/2 CAD // s/p Maze + LAA clipping in 9/21 // Apixaban   Palpitations    PTSD (post-traumatic stress disorder)    [name] - [employer]   Right inguinal hernia    S/P Maze operation for atrial fibrillation 08/26/2020   at same time cabg    Current Medications: Current Meds  Medication Sig   acetaminophen (TYLENOL) 500 MG tablet Take 1,000 mg by mouth daily as needed for moderate pain or headache.   ALPRAZolam (XANAX) 0.5 MG tablet Take 0.25 mg by mouth at bedtime.    aspirin EC 81 MG tablet Take 1 tablet (81 mg total) by mouth daily. Swallow whole.   atorvastatin (LIPITOR) 40 MG tablet Take 1 tablet (40 mg total) by mouth daily.   Cholecalciferol (VITAMIN D) 50 MCG (2000 UT) tablet Take 2,000 Units by mouth daily.   Cyanocobalamin (B-12 PO) Take 2 tablets by mouth 2 (two) times daily.   eplerenone (INSPRA) 25 MG tablet Take 25 mg by mouth daily.   fluticasone (FLONASE) 50 MCG/ACT nasal spray Place 2 sprays into both nostrils 2 (two) times daily as needed for allergies or rhinitis.   isosorbide mononitrate (IMDUR) 30 MG 24 hr tablet Take 1 tablet (30 mg total) by mouth daily.   loratadine (CLARITIN) 10 MG tablet Take 10 mg by mouth daily.   losartan (COZAAR) 100 MG tablet Take 1 tablet (100 mg total) by mouth daily.   nitroGLYCERIN (NITROSTAT) 0.4 MG SL tablet nitroglycerin 0.4 mg sublingual tablet DISSOLVE ONE TABLET UNDER THE TONGUE Larsen 5 MINUTES AS NEEDED FOR CHEST PAIN. DO NOT EXCEED A TOTAL OF 3 DOSES IN 15 MINUTES   Omega-3 Fatty Acids (FISH OIL) 1000 MG CAPS Take 1,000 mg by mouth daily.   omeprazole (PRILOSEC) 20 MG capsule Take 20 mg by mouth 2 (two) times daily as needed (for heartburn / acid reflux).    QUEtiapine (SEROQUEL) 100 MG tablet Take 25 mg by mouth at bedtime. Pt only takes 1/4 tab    tamsulosin (FLOMAX) 0.4 MG CAPS capsule TAKE 1 CAPSULE BY MOUTH ONCE DAILY AFTER BREAKFAST     Allergies:   Terazosin, Enalapril maleate, Lisinopril, Penicillins, and Adhesive [tape]   Social History   Tobacco Use   Smoking status: Former    Packs/day: 3.00    Years: 20.00    Total pack years: 60.00    Types: Cigarettes    Start date: 02/09/1965    Quit date: 05/12/1977    Years since quitting: 45.5   Smokeless tobacco: Never  Vaping Use   Vaping Use: Never used  Substance Use Topics   Alcohol use: Not Currently    Alcohol/week: 7.0 - 14.0 standard drinks of alcohol    Types: 7 - 14 Cans of beer per week    Comment: 03-23-2021 per pt 1 to 2 beers daily (hx alcohol abuse   Drug use: Never     Family Hx: The patient's family history includes Heart disease in his father and mother; Heart murmur in his sister; Hypertension in his brother, sister, sister, sister, and sister; Liver disease in his brother. There is no history of Colon cancer.  ROS   EKGs/Labs/Other Test Reviewed:    EKG:   SR rate 75 11/10/20   Recent Labs: 08/10/2022: BUN 12; Creatinine, Ser 0.88; Potassium 4.0; Sodium 140   Recent Lipid Panel Lab Results  Component Value Date/Time   CHOL 77 (L) 11/18/2020 10:05 AM   TRIG 37 11/18/2020 10:05 AM   TRIG 82 03/25/2015 09:50 AM   HDL 32 (L) 11/18/2020 10:05 AM   HDL 38 (L) 03/25/2015 09:50 AM   CHOLHDL 2.4 11/18/2020 10:05 AM   LDLCALC 34 11/18/2020 10:05 AM   LDLCALC 54 07/17/2014 08:28 AM   LDLDIRECT 74 01/03/2017 11:00 AM      Risk Assessment/Calculations:     CHA2DS2-VASc Score =    This indicates a  % annual risk of stroke. The patient's score is based upon:      Physical Exam:    VS:  BP 134/82   Pulse (!) 56   Ht '5\' 11"'$  (1.803 m)   Wt 188 lb (85.3 kg)   SpO2 98%   BMI 26.22 kg/m     Wt Readings from Last 3 Encounters:  11/21/22 188 lb (85.3 kg)  05/12/22 189 lb (85.7 kg)  03/01/22 194 lb (88 kg)     Affect  appropriate Healthy:  appears stated age 21: normal Neck supple with no adenopathy JVP normal no bruits no thyromegaly Lungs clear with no wheezing and good diaphragmatic motion Heart:  S1/S2 no murmur, no rub, gallop or click PMI normal post sternotomy  Abdomen: benighn, BS positve, no tenderness, no AAA no bruit.  No HSM or HJR Distal pulses intact with no bruits No edema Neuro non-focal Skin warm and dry No muscular weakness    ASSESSMENT & PLAN:    1. Essential hypertension - improved on current meds including Inspra, atenolol, imdur, cozaar - low sodium diet   2. CAD/CABG. -08/26/20 doing well with preserved EF resume ASA 81 mg  Beta blocker and statin   4. PAF -Maintaining sinus rhythm.  He is status post Maze procedure and atrial appendage clipping.  D/C eliquis K[place]ia monitoring   5. Hyperlipidemia, unspecified hyperlipidemia type -LDL optimal on most recent lab work.  Continue current Rx.    6. Dilated Aorta:  3.5 cm on CTA 08/17/22 Observe    7. Gynecomastia:  :  improved off aldactone      Dispo:  F/U in a year   Medication Adjustments/Labs and Tests Ordered: Current medicines are reviewed at length with the patient today.  Concerns regarding medicines are outlined above.  Tests Ordered: No orders of the defined types were placed in this encounter.  Medication Changes: No orders of the defined types were placed in this encounter.   Signed, Jenkins Rouge, MD  11/21/2022 10:01 AM    [facility] [address], [place], [place]  [address] Phone: [phone]; Fax: [phone]

## 2022-11-15 ENCOUNTER — Telehealth: Payer: Self-pay | Admitting: Family Medicine

## 2022-11-15 NOTE — Telephone Encounter (Signed)
Left message for patient to call back and schedule Medicare Annual Wellness Visit (AWV) to be completed by video or phone.   Last AWV: 08/31/2021   Please schedule at anytime with [place]     45 minute appointment  Any questions, please contact me at [phone]

## 2022-11-21 ENCOUNTER — Ambulatory Visit: Payer: Medicare Other | Attending: Cardiovascular Disease | Admitting: Cardiovascular Disease

## 2022-11-21 ENCOUNTER — Encounter: Payer: Self-pay | Admitting: Cardiovascular Disease

## 2022-11-21 VITALS — BP 134/82 | HR 56 | Ht 71.0 in | Wt 188.0 lb

## 2022-11-21 DIAGNOSIS — Z951 Presence of aortocoronary bypass graft: Secondary | ICD-10-CM | POA: Insufficient documentation

## 2022-11-21 DIAGNOSIS — I10 Essential (primary) hypertension: Secondary | ICD-10-CM | POA: Diagnosis not present

## 2022-11-21 DIAGNOSIS — I25119 Atherosclerotic heart disease of native coronary artery with unspecified angina pectoris: Secondary | ICD-10-CM | POA: Diagnosis not present

## 2022-11-21 DIAGNOSIS — I77819 Aortic ectasia, unspecified site: Secondary | ICD-10-CM | POA: Diagnosis not present

## 2022-11-21 DIAGNOSIS — I480 Paroxysmal atrial fibrillation: Secondary | ICD-10-CM | POA: Insufficient documentation

## 2022-11-21 MED ORDER — NITROGLYCERIN 0.4 MG SL SUBL: SUBLINGUAL_TABLET | SUBLINGUAL | 3 refills | Status: DC

## 2022-11-21 NOTE — Addendum Note (Signed)
Addended by: Aris Georgia, Frankee Gritz L on: 11/21/2022 10:16 AM   Modules accepted: Orders

## 2022-11-21 NOTE — Patient Instructions (Addendum)
Medication Instructions:  Your physician recommends that you continue on your current medications as directed. Please refer to the Current Medication list given to you today.  *If you need a refill on your cardiac medications before your next appointment, please call your pharmacy*  Lab Work: If you have labs (blood work) drawn today and your tests are completely normal, you will receive your results only by: [place] (if you have MyChart) OR A paper copy in the mail If you have any lab test that is abnormal or we need to change your treatment, we will call you to review the results.  Testing/Procedures: None ordered today.  Follow-Up: At [facility], you and your health needs are our priority.  As part of our continuing mission to provide you with exceptional heart care, we have created designated Provider Care Teams.  These Care Teams include your primary Cardiologist (physician) and Advanced Practice Providers (APPs -  Physician Assistants and Nurse Practitioners) who all work together to provide you with the care you need, when you need it.  We recommend signing up for the patient portal called "MyChart".  Sign up information is provided on this After Visit Summary.  MyChart is used to connect with patients for Virtual Visits (Telemedicine).  Patients are able to view lab/test results, encounter notes, upcoming appointments, etc.  Non-urgent messages can be sent to your provider as well.   To learn more about what you can do with MyChart, go to [email or web address].    Your next appointment:   6 month(s)  The format for your next appointment:   In Person  Provider:   Jenkins Rouge, MD      Important Information About Sugar

## 2022-11-29 DIAGNOSIS — L570 Actinic keratosis: Secondary | ICD-10-CM | POA: Diagnosis not present

## 2022-11-29 DIAGNOSIS — L309 Dermatitis, unspecified: Secondary | ICD-10-CM | POA: Diagnosis not present

## 2022-11-29 DIAGNOSIS — Z85828 Personal history of other malignant neoplasm of skin: Secondary | ICD-10-CM | POA: Diagnosis not present

## 2022-12-15 ENCOUNTER — Encounter: Payer: Self-pay | Admitting: Nurse Practitioner

## 2022-12-15 ENCOUNTER — Ambulatory Visit (INDEPENDENT_AMBULATORY_CARE_PROVIDER_SITE_OTHER): Payer: Medicare Other | Admitting: Nurse Practitioner

## 2022-12-15 VITALS — BP 154/81 | HR 62 | Temp 98.1°F | Resp 20 | Ht 71.0 in | Wt 193.0 lb

## 2022-12-15 DIAGNOSIS — J069 Acute upper respiratory infection, unspecified: Secondary | ICD-10-CM | POA: Diagnosis not present

## 2022-12-15 MED ORDER — DOXYCYCLINE HYCLATE 100 MG PO TABS: 100.0000 mg | ORAL_TABLET | Freq: Two times a day (BID) | ORAL | 0 refills | Status: DC

## 2022-12-15 NOTE — Patient Instructions (Signed)

## 2022-12-15 NOTE — Progress Notes (Signed)
Subjective:    Patient ID: Joseph Larsen, male    DOB: 1946-06-10, 77 y.o.   MRN: [id number]   Chief Complaint: Nasal Congestion and Cough (Since Monday)   Cough This is a new problem. Episode onset: sunday night. The problem has been gradually worsening. The problem occurs every few minutes. The cough is Productive of sputum. Associated symptoms include nasal congestion, rhinorrhea and a sore throat. Pertinent negatives include no chills, ear congestion, ear pain, fever, headaches or shortness of breath. Nothing aggravates the symptoms. Treatments tried: nyquil. The treatment provided mild relief.       Review of Systems  Constitutional:  Negative for chills, fatigue and fever.  HENT:  Positive for congestion, rhinorrhea and sore throat. Negative for ear pain.   Respiratory:  Positive for cough. Negative for shortness of breath.   Neurological:  Negative for headaches.       Objective:   Physical Exam Vitals and nursing note reviewed.  Constitutional:      Appearance: Normal appearance. He is well-developed.  HENT:     Head: Normocephalic.     Right Ear: Tympanic membrane normal.     Left Ear: Tympanic membrane normal.     Nose: Congestion and rhinorrhea present.     Mouth/Throat:     Mouth: Mucous membranes are moist.     Pharynx: Oropharynx is clear.  Eyes:     Pupils: Pupils are equal, round, and reactive to light.  Neck:     Thyroid: No thyroid mass or thyromegaly.     Vascular: No carotid bruit or JVD.     Trachea: Phonation normal.  Cardiovascular:     Rate and Rhythm: Normal rate and regular rhythm.  Pulmonary:     Effort: Pulmonary effort is normal. No respiratory distress.     Breath sounds: Normal breath sounds. No wheezing or rales.  Abdominal:     General: Bowel sounds are normal.     Palpations: Abdomen is soft.     Tenderness: There is no abdominal tenderness.  Musculoskeletal:        General: Normal range of motion.     Cervical back: Normal range  of motion and neck supple.  Lymphadenopathy:     Cervical: No cervical adenopathy.  Skin:    General: Skin is warm and dry.  Neurological:     Mental Status: He is alert and oriented to person, place, and time.  Psychiatric:        Behavior: Behavior normal.        Thought Content: Thought content normal.        Judgment: Judgment normal.   BP (!) 154/81   Pulse 62   Temp 98.1 F (36.7 C) (Temporal)   Resp 20   Ht '5\' 11"'$  (1.803 m)   Wt 193 lb (87.5 kg)   SpO2 98%   BMI 26.92 kg/m          Assessment & Plan:   Joseph Larsen in today with chief complaint of Nasal Congestion and Cough (Since Monday)   1. URI with cough and congestion 1. Take meds as prescribed 2. Use a cool mist humidifier especially during the winter months and when heat has been humid. 3. Use saline nose sprays frequently 4. Saline irrigations of the nose can be very helpful if done frequently.  * 4X daily for 1 week*  * Use of a nettie pot can be helpful with this. Follow directions with this* 5. Drink plenty of  fluids 6. Keep thermostat turn down low 7.For any cough or congestion- mucinex OTC 8. For fever or aces or pains- take tylenol or ibuprofen appropriate for age and weight.  * for fevers greater than 101 orally you may alternate ibuprofen and tylenol every  3 hours.    Meds ordered this encounter  Medications   doxycycline (VIBRA-TABS) 100 MG tablet    Sig: Take 1 tablet (100 mg total) by mouth 2 (two) times daily. 1 po bid    Dispense:  20 tablet    Refill:  0    Order Specific Question:   Supervising Provider    Answer:   Caryl Pina A [[id number]]      The above assessment and management plan was discussed with the patient. The patient verbalized understanding of and has agreed to the management plan. Patient is aware to call the clinic if symptoms persist or worsen. Patient is aware when to return to the clinic for a follow-up visit. Patient educated on when it is appropriate to  go to the emergency department.   Mary-Margaret Hassell Done, FNP

## 2022-12-23 ENCOUNTER — Ambulatory Visit (INDEPENDENT_AMBULATORY_CARE_PROVIDER_SITE_OTHER): Payer: Medicare Other

## 2022-12-23 VITALS — Ht 71.0 in | Wt 182.0 lb

## 2022-12-23 DIAGNOSIS — Z0000 Encounter for general adult medical examination without abnormal findings: Secondary | ICD-10-CM

## 2022-12-23 NOTE — Progress Notes (Signed)
Subjective:   Joseph Larsen is a 77 y.o. male who presents for Medicare Annual/Subsequent preventive examination. I connected with  ABDULAH IQBAL on 12/23/22 by a audio enabled telemedicine application and verified that I am speaking with the correct person using two identifiers.  Patient Location: Home  Provider Location: Home Office  I discussed the limitations of evaluation and management by telemedicine. The patient expressed understanding and agreed to proceed.  Review of Systems     Cardiac Risk Factors include: advanced age (>67mn, >>56women)     Objective:    Today's Vitals   12/23/22 1403  Weight: 182 lb (82.6 kg)  Height: '5\' 11"'$  (1.803 m)   Body mass index is 25.38 kg/m.     12/23/2022    2:06 PM 08/31/2021    2:52 PM 03/29/2021   10:21 AM 08/26/2020   11:00 PM 08/20/2020   11:11 AM 08/07/2020    9:14 AM 06/01/2020    8:29 AM  Advanced Directives  Does Patient Have a Medical Advance Directive? Yes Yes Yes Yes  Yes Yes  Type of AParamedicof [place] Sh[place]Living will HSt. [place]Living will HDe WittLiving will [place]Living will [place]Living will [place] Does patient want to make changes to medical advance directive?   No - Patient declined No - Patient declined     Copy of [place] Chart? No - copy requested No - copy requested  No - copy requested No - copy requested No - copy requested No - copy requested    Current Medications (verified) Outpatient Encounter Medications as of 12/23/2022  Medication Sig   acetaminophen (TYLENOL) 500 MG tablet Take 1,000 mg by mouth daily as needed for moderate pain or headache.   ALPRAZolam (XANAX) 0.5 MG tablet Take 0.25 mg by mouth at bedtime.    aspirin EC 81 MG tablet Take 1 tablet (81 mg total) by mouth daily. Swallow whole.   atorvastatin  (LIPITOR) 40 MG tablet Take 1 tablet (40 mg total) by mouth daily.   Cholecalciferol (VITAMIN D) 50 MCG (2000 UT) tablet Take 2,000 Units by mouth daily.   Cyanocobalamin (B-12 PO) Take 2 tablets by mouth 2 (two) times daily.   doxycycline (VIBRA-TABS) 100 MG tablet Take 1 tablet (100 mg total) by mouth 2 (two) times daily. 1 po bid   eplerenone (INSPRA) 25 MG tablet Take 25 mg by mouth daily.   fluticasone (FLONASE) 50 MCG/ACT nasal spray Place 2 sprays into both nostrils 2 (two) times daily as needed for allergies or rhinitis.   isosorbide mononitrate (IMDUR) 30 MG 24 hr tablet Take 1 tablet (30 mg total) by mouth daily.   loratadine (CLARITIN) 10 MG tablet Take 10 mg by mouth daily.   losartan (COZAAR) 100 MG tablet Take 1 tablet (100 mg total) by mouth daily.   nitroGLYCERIN (NITROSTAT) 0.4 MG SL tablet nitroglycerin 0.4 mg sublingual tablet DISSOLVE ONE TABLET UNDER THE TONGUE EVERY 5 MINUTES AS NEEDED FOR CHEST PAIN. DO NOT EXCEED A TOTAL OF 3 DOSES IN 15 MINUTES   Omega-3 Fatty Acids (FISH OIL) 1000 MG CAPS Take 1,000 mg by mouth daily.   omeprazole (PRILOSEC) 20 MG capsule Take 20 mg by mouth 2 (two) times daily as needed (for heartburn / acid reflux).    QUEtiapine (SEROQUEL) 100 MG tablet Take 25 mg by mouth at bedtime. Pt only  takes 1/4 tab   tamsulosin (FLOMAX) 0.4 MG CAPS capsule TAKE 1 CAPSULE BY MOUTH ONCE DAILY AFTER BREAKFAST   atenolol (TENORMIN) 25 MG tablet Take 0.5 tablets (12.5 mg total) by mouth daily.   No facility-administered encounter medications on file as of 12/23/2022.    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   CT in 07/2021: 3.8 cm // Chest/aorta CTA: Ascending aorta 3.5 cm, descending aorta 3.5 cm   BPH (benign prostatic hyperplasia)    Carotid artery disease  ([place])    [place] 9/21: bilat ICA 1-39    Coronary artery disease cardiologist--- dr Johnsie Cancel   nuclear stress test showed low risk no ischemia 05-29-2020;  coronary CTA 07-21-2020 cal score 3159 with disease;  cath 08-07-2020 severe multiple cad;  08-26-2020 S/p CABG x4  and MAZE procedure  LIMA -LAD, SVG -Dx, SVG -RI, SVG -OM)   Depression    Diverticular disease    Dyspnea on exertion    Echocardiogram    Echocardiogram 12/21: EF 60-65, no RWMA, mod asymmetric basal-septal LVH, normal diastolic fn, normal RVSF, RVSP 26, mild MR, mild AV sclerosis (no AS), dilated aortic root (43 mm), dilated ascending aorta (43 mm), no effusion    ETOH abuse    First degree heart block    GERD (gastroesophageal reflux disease)    Gout    per pt last episode 2019   Hemorrhoids    Hiatal hernia    History of 2019 novel coronavirus disease (COVID-19)    11-27-2019 positive result in care everywhere, per pt mild symptoms that resolved   HTN (hypertension)    Hyperlipidemia 09/10/2020   Low testosterone    PAF (paroxysmal atrial fibrillation) [facility]) cardiologist--- dr Johnsie Cancel--- first dx 08/ 2019   failed DCCV in 6/21 x2   // Flecainide Rx >>DC'd 2/2 CAD // s/p Maze + LAA clipping in 9/21 // Apixaban   Palpitations    PTSD (post-traumatic stress disorder)    [name] - [employer]   Right inguinal hernia    S/P Maze operation for atrial fibrillation 08/26/2020   at same time cabg   Past Surgical History:  Procedure Laterality Date   CARDIOVERSION N/A 05/15/2020   Procedure: CARDIOVERSION;  Surgeon: Sueanne Margarita, MD;  Location[facility] ENDOSCOPY;  Service: Cardiovascular;  Laterality: N/A;   CARDIOVERSION N/A 06/01/2020   Procedure: CARDIOVERSION;  Surgeon: Elouise Munroe, MD;  Location: [facility] ENDOSCOPY;  Service: Cardiovascular;  Laterality: N/A;   CLIPPING OF ATRIAL APPENDAGE N/A 08/26/2020   Procedure: CLIPPING OF ATRIAL APPENDAGE;  Surgeon: Gaye Pollack, MD;  Location: [place];  Service: Open Heart Surgery;   Laterality: N/A;   CORONARY ARTERY BYPASS GRAFT N/A 08/26/2020   Procedure: CORONARY ARTERY BYPASS GRAFTING TIMES FOUR USING LEFT INTERNAL MAMMARY ARTERY AND ENDOSCOPICALLY HARVESTED RIGHT GREATER SAPHENOUS VEIN;  Surgeon: Gaye Pollack, MD;  Location: [place];  Service: Open Heart Surgery;  Laterality: N/A;   ESOPHAGOGASTRODUODENOSCOPY  last one 03-24-2020  '@[facility]'$    INGUINAL HERNIA REPAIR Right 03/29/2021   Procedure: OPEN REPAIR RIGHT INGUINAL HERNIA WITH MESH;  Surgeon: Armandina Gemma, MD;  Location: [place];  Service: General;  Laterality: Right;  [place] ARTHROSCOPY Bilateral 2010;  2012   LAPAROSCOPIC CHOLECYSTECTOMY  08-31-2005  '@[facility]'$    LEFT HEART  CATH AND CORONARY ANGIOGRAPHY N/A 08/07/2020   Procedure: LEFT HEART CATH AND CORONARY ANGIOGRAPHY;  Surgeon: Martinique, Peter M, MD;  Location: [place] CV LAB;  Service: Cardiovascular;  Laterality: N/A;   MAZE N/A 08/26/2020   Procedure: MAZE;  Surgeon: Gaye Pollack, MD;  Location: [facility] OR;  Service: Open Heart Surgery;  Laterality: N/A;   SHOULDER ARTHROSCOPY Right 01/14/2020   TEE WITHOUT CARDIOVERSION N/A 08/26/2020   Procedure: TRANSESOPHAGEAL ECHOCARDIOGRAM (TEE);  Surgeon: Gaye Pollack, MD;  Location: [place];  Service: Open Heart Surgery;  Laterality: N/A;   Family History  Problem Relation Age of Onset   Heart disease Mother        "died of chf"   Heart disease Father        "died of old age"   Hypertension Sister    Liver disease Brother    Hypertension Brother    Hypertension Sister    Hypertension Sister    Hypertension Sister    Heart murmur Sister    Colon cancer Neg Hx    Social History   Socioeconomic History   Marital status: Married    Spouse name: Suanne Marker   Number of children: 1   Years of education: Not on file   Highest education level: Not on file  Occupational History   Occupation: disabled    Employer: RETIRED    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  Tobacco Use    Smoking status: Former    Packs/day: 3.00    Years: 20.00    Total pack years: 60.00    Types: Cigarettes    Start date: 02/09/1965    Quit date: 05/12/1977    Years since quitting: 45.6   Smokeless tobacco: Never  Vaping Use   Vaping Use: Never used  Substance and Sexual Activity   Alcohol use: Not Currently    Alcohol/week: 7.0 - 14.0 standard drinks of alcohol    Types: 7 - 14 Cans of beer per week    Comment: 03-23-2021 per pt 1 to 2 beers daily (hx alcohol abuse   Drug use: Never   Sexual activity: Yes  Other Topics Concern   Not on file  Social History Narrative   Lives with wife in [place] in a one story home.  Exercises @ gym 3x/wk - stationary bike and light weight training.   Social Determinants of Health   Financial Resource Strain: Low Risk  (12/23/2022)   Overall Financial Resource Strain (CARDIA)    Difficulty of Paying Living Expenses: Not hard at all  Food Insecurity: No Food Insecurity (12/23/2022)   Hunger Vital Sign    Worried About Running Out of Food in the Last Year: Never true    Ran Out of Food in the Last Year: Never true  Transportation Needs: No Transportation Needs (12/23/2022)   PRAPARE - Hydrologist (Medical): No    Lack of Transportation (Non-Medical): No  Physical Activity: Insufficiently Active (12/23/2022)   Exercise Vital Sign    Days of Exercise per Week: 3 days    Minutes of Exercise per Session: 30 min  Stress: No Stress Concern Present (12/23/2022)   [place]    Feeling of Stress : Not at all  Social Connections: Moderately Isolated (12/23/2022)   Social Connection and Isolation Panel [NHANES]    Frequency of Communication with Friends and Family: More than three times a week    Frequency of Social  Gatherings with Friends and Family: More than three times a week    Attends Religious Services: Never    Marine scientist or Organizations: No     Attends Music therapist: Never    Marital Status: Married    Tobacco Counseling Counseling given: Not Answered   Clinical Intake:  Pre-visit preparation completed: Yes  Pain : No/denies pain     Nutritional Risks: None Diabetes: No  How often do you need to have someone help you when you read instructions, pamphlets, or other written materials from your doctor or pharmacy?: 1 - Never  Diabetic?no   Interpreter Needed?: No  Information entered by :: Jadene Pierini, LPN   Activities of Daily Living    12/23/2022    2:07 PM  In your present state of health, do you have any difficulty performing the following activities:  Hearing? 0  Vision? 0  Difficulty concentrating or making decisions? 0  Walking or climbing stairs? 0  Dressing or bathing? 0  Doing errands, shopping? 0  Preparing Food and eating ? N  Using the Toilet? N  In the past six months, have you accidently leaked urine? N  Do you have problems with loss of bowel control? N  Managing your Medications? N  Managing your Finances? N  Housekeeping or managing your Housekeeping? N    Patient Care Team: [name], DO as PCP - General (Family Medicine) Josue Hector, MD as PCP - Cardiology (Cardiology) Ladene Artist, MD as Consulting Physician (Gastroenterology) Nash Dimmer, MD as Consulting Physician (Gastroenterology)  Indicate any recent Medical Services you may have received from other than [facility] providers in the past year (date may be approximate).     Assessment:   This is a routine wellness examination for [place].  Hearing/Vision screen Vision Screening - Comments:: Wears rx glasses - [facility]  Dietary issues and exercise activities discussed: Current Exercise Habits: Home exercise routine, Type of exercise: walking, Time (Minutes): 30, Frequency (Times/Week): 3, Weekly Exercise (Minutes/Week): 90, Intensity: Mild, Exercise  limited by: None identified   Goals Addressed             This Visit's Progress    Exercise 3x per week (30 min per time)   On track      Depression Screen    12/23/2022    2:06 PM 12/15/2022    9:29 AM 03/01/2022   10:04 AM 10/04/2021    2:08 PM 08/31/2021    2:44 PM 05/26/2021   10:24 AM 01/11/2021   10:16 AM  PHQ 2/9 Scores  PHQ - 2 Score 0 0 0 0 0 0 0  PHQ- 9 Score 0 0  0       Fall Risk    12/23/2022    2:04 PM 12/15/2022    9:29 AM 03/01/2022   10:04 AM 10/04/2021    2:09 PM 08/31/2021    2:50 PM  [place] in the past year? 0 0 0 0 0  Number falls in past yr: 0    0  Injury with Fall? 0    0  Risk for fall due to : No Fall Risks    No Fall Risks  Follow up Falls prevention discussed    Falls prevention discussed    FALL RISK PREVENTION PERTAINING TO THE HOME:  Any stairs in or around the home? No  If so, are there  any without handrails? No  Home free of loose throw rugs in walkways, pet beds, electrical cords, etc? Yes  Adequate lighting in your home to reduce risk of falls? Yes   ASSISTIVE DEVICES UTILIZED TO PREVENT FALLS:  Life alert? No  Use of a cane, walker or w/c? No  Grab bars in the bathroom? No  Shower chair or bench in shower? No  Elevated toilet seat or a handicapped toilet? No       01/09/2018   10:46 AM 01/03/2017   10:44 AM 01/19/2016    9:18 AM  MMSE - Mini Mental State Exam  Orientation to time '5 5 4  '$ Orientation to Place '5 5 5  '$ Registration '3 3 3  '$ Attention/ Calculation '5 5 2  '$ Recall '3 3 3  '$ Language- name 2 objects '2 2 2  '$ Language- repeat '1 1 1  '$ Language- follow 3 step command '3 3 3  '$ Language- read & follow direction '1 1 1  '$ Write a sentence '1 1 1  '$ Copy design 0 1 1  Total score '29 30 26        '$ 12/23/2022    2:07 PM  6CIT Screen  What Year? 0 points  What month? 0 points  What time? 0 points  Count back from 20 0 points  Months in reverse 0 points  Repeat phrase 0 points  Total Score 0 points     Immunizations Immunization History  Administered Date(s) Administered   Fluad Quad(high Dose 65+) 10/14/2020   Influenza Split 11/18/2011, 11/11/2012   Influenza,inj,Quad PF,6+ Mos 10/02/2013   Influenza,inj,quad, With Preservative 09/12/2019   Influenza-Unspecified 10/13/2015, 09/09/2016, 09/26/2017, 09/12/2018, 09/17/2019   Moderna Sars-Covid-2 Vaccination 01/17/2020, 02/14/2020, 10/07/2020   PNEUMOCOCCAL CONJUGATE-20 08/17/2021   Pneumococcal Conjugate-13 03/03/2014   Pneumococcal Polysaccharide-23 12/13/2011, 06/01/2012   Tdap 04/11/2009   Zoster Recombinat (Shingrix) 11/14/2018   Zoster, Live 01/13/2012    TDAP status: Up to date  Flu Vaccine status: Up to date  Pneumococcal vaccine status: Up to date  Covid-19 vaccine status: Completed vaccines  Qualifies for Shingles Vaccine? Yes   Zostavax completed No   Shingrix Completed?: No.    Education has been provided regarding the importance of this vaccine. Patient has been advised to call insurance company to determine out of pocket expense if they have not yet received this vaccine. Advised may also receive vaccine at local pharmacy or Health Dept. Verbalized acceptance and understanding.  Screening Tests Health Maintenance  Topic Date Due   Zoster Vaccines- Shingrix (2 of 2) 01/09/2019   DTaP/Tdap/Td (2 - Td or Tdap) 04/12/2019   COVID-19 Vaccine (4 - 2023-24 season) 08/12/2022   INFLUENZA VACCINE  03/12/2023 (Originally 07/12/2022)   Medicare Annual Wellness (AWV)  12/24/2023   COLONOSCOPY (Pts 45-80yr Insurance coverage will need to be confirmed)  06/25/2024   Pneumonia Vaccine 77 Years old  Completed   Hepatitis C Screening  Completed   HPV VACCINES  Aged Out    Health Maintenance  Health Maintenance Due  Topic Date Due   Zoster Vaccines- Shingrix (2 of 2) 01/09/2019   DTaP/Tdap/Td (2 - Td or Tdap) 04/12/2019   COVID-19 Vaccine (4 - 2023-24 season) 08/12/2022    Colorectal cancer screening: No longer  required.   Lung Cancer Screening: (Low Dose CT Chest recommended if Age 383-80years, 30 pack-year currently smoking OR have quit w/in 15years.) does not qualify.   Lung Cancer Screening Referral: n/a  Additional Screening:  Hepatitis C Screening: does not qualify;  Vision Screening: Recommended annual ophthalmology exams for early detection of glaucoma and other disorders of the eye. Is the patient up to date with their annual eye exam?  Yes  Who is the provider or what is the name of the office in which the patient attends [facility] If pt is not established with a provider, would they like to be referred to a provider to establish care? No .   Dental Screening: Recommended annual dental exams for proper oral hygiene  Community Resource Referral / Chronic Care Management: CRR required this visit?  No   CCM required this visit?  No      Plan:     I have personally reviewed and noted the following in the patient's chart:   Medical and social history Use of alcohol, tobacco or illicit drugs  Current medications and supplements including opioid prescriptions. Patient is not currently taking opioid prescriptions. Functional ability and status Nutritional status Physical activity Advanced directives List of other physicians Hospitalizations, surgeries, and ER visits in previous 12 months Vitals Screenings to include cognitive, depression, and falls Referrals and appointments  In addition, I have reviewed and discussed with patient certain preventive protocols, quality metrics, and best practice recommendations. A written personalized care plan for preventive services as well as general preventive health recommendations were provided to patient.     Daphane Shepherd, LPN   7/58/8325   Nurse Notes: Due 2nd Dose Shingrix

## 2022-12-23 NOTE — Patient Instructions (Signed)
[name] , Thank you for taking time to come for your Medicare Wellness Visit. I appreciate your ongoing commitment to your health goals. Please review the following plan we discussed and let me know if I can assist you in the future.   These are the goals we discussed:  Goals      Exercise 3x per week (30 min per time)        This is a list of the screening recommended for you and due dates:  Health Maintenance  Topic Date Due   Zoster (Shingles) Vaccine (2 of 2) 01/09/2019   DTaP/Tdap/Td vaccine (2 - Td or Tdap) 04/12/2019   COVID-19 Vaccine (4 - 2023-24 season) 08/12/2022   Flu Shot  03/12/2023*   Medicare Annual Wellness Visit  12/24/2023   Colon Cancer Screening  06/25/2024   Pneumonia Vaccine  Completed   Hepatitis C Screening: USPSTF Recommendation to screen - Ages 18-79 yo.  Completed   HPV Vaccine  Aged Out  *Topic was postponed. The date shown is not the original due date.    Advanced directives: Advance directive discussed with you today. I have provided a copy for you to complete at home and have notarized. Once this is complete please bring a copy in to our office so we can scan it into your chart.   Conditions/risks identified: Aim for 30 minutes of exercise or brisk walking, 6-8 glasses of water, and 5 servings of fruits and vegetables each day.   Next appointment: Follow up in one year for your annual wellness visit.   Preventive Care 35 Years and Older, Male  Preventive care refers to lifestyle choices and visits with your health care provider that can promote health and wellness. What does preventive care include? A yearly physical exam. This is also called an annual well check. Dental exams once or twice a year. Routine eye exams. Ask your health care provider how often you should have your eyes checked. Personal lifestyle choices, including: Daily care of your teeth and gums. Regular physical activity. Eating a healthy diet. Avoiding tobacco and drug  use. Limiting alcohol use. Practicing safe sex. Taking low doses of aspirin every day. Taking vitamin and mineral supplements as recommended by your health care provider. What happens during an annual well check? The services and screenings done by your health care provider during your annual well check will depend on your age, overall health, lifestyle risk factors, and family history of disease. Counseling  Your health care provider may ask you questions about your: Alcohol use. Tobacco use. Drug use. Emotional well-being. Home and relationship well-being. Sexual activity. Eating habits. History of falls. Memory and ability to understand (cognition). Work and work Statistician. Screening  You may have the following tests or measurements: Height, weight, and BMI. Blood pressure. Lipid and cholesterol levels. These may be checked every 5 years, or more frequently if you are over 71 years old. Skin check. Lung cancer screening. You may have this screening every year starting at age 54 if you have a 30-pack-year history of smoking and currently smoke or have quit within the past 15 years. Fecal occult blood test (FOBT) of the stool. You may have this test every year starting at age 8. Flexible sigmoidoscopy or colonoscopy. You may have a sigmoidoscopy every 5 years or a colonoscopy every 10 years starting at age 15. Prostate cancer screening. Recommendations will vary depending on your family history and other risks. Hepatitis C blood test. Hepatitis B blood test. Sexually  transmitted disease (STD) testing. Diabetes screening. This is done by checking your blood sugar (glucose) after you have not eaten for a while (fasting). You may have this done every 1-3 years. Abdominal aortic aneurysm (AAA) screening. You may need this if you are a current or former smoker. Osteoporosis. You may be screened starting at age 64 if you are at high risk. Talk with your health care provider about  your test results, treatment options, and if necessary, the need for more tests. Vaccines  Your health care provider may recommend certain vaccines, such as: Influenza vaccine. This is recommended every year. Tetanus, diphtheria, and acellular pertussis (Tdap, Td) vaccine. You may need a Td booster every 10 years. Zoster vaccine. You may need this after age 29. Pneumococcal 13-valent conjugate (PCV13) vaccine. One dose is recommended after age 67. Pneumococcal polysaccharide (PPSV23) vaccine. One dose is recommended after age 49. Talk to your health care provider about which screenings and vaccines you need and how often you need them. This information is not intended to replace advice given to you by your health care provider. Make sure you discuss any questions you have with your health care provider. Document Released: 12/25/2015 Document Revised: 08/17/2016 Document Reviewed: 09/29/2015 Elsevier Interactive Patient Education  2017 [place] Prevention in the Home Falls can cause injuries. They can happen to people of all ages. There are many things you can do to make your home safe and to help prevent falls. What can I do on the outside of my home? Regularly fix the edges of walkways and driveways and fix any cracks. Remove anything that might make you trip as you walk through a door, such as a raised step or threshold. Trim any bushes or trees on the path to your home. Use bright outdoor lighting. Clear any walking paths of anything that might make someone trip, such as rocks or tools. Regularly check to see if handrails are loose or broken. Make sure that both sides of any steps have handrails. Any raised decks and porches should have guardrails on the edges. Have any leaves, snow, or ice cleared regularly. Use sand or salt on walking paths during winter. Clean up any spills in your garage right away. This includes oil or grease spills. What can I do in the bathroom? Use  night lights. Install grab bars by the toilet and in the tub and shower. Do not use towel bars as grab bars. Use non-skid mats or decals in the tub or shower. If you need to sit down in the shower, use a plastic, non-slip stool. Keep the floor dry. Clean up any water that spills on the floor as soon as it happens. Remove soap buildup in the tub or shower regularly. Attach bath mats securely with double-sided non-slip rug tape. Do not have throw rugs and other things on the floor that can make you trip. What can I do in the bedroom? Use night lights. Make sure that you have a light by your bed that is easy to reach. Do not use any sheets or blankets that are too big for your bed. They should not hang down onto the floor. Have a firm chair that has side arms. You can use this for support while you get dressed. Do not have throw rugs and other things on the floor that can make you trip. What can I do in the kitchen? Clean up any spills right away. Avoid walking on wet floors. Keep items that you use  a lot in easy-to-reach places. If you need to reach something above you, use a strong step stool that has a grab bar. Keep electrical cords out of the way. Do not use floor polish or wax that makes floors slippery. If you must use wax, use non-skid floor wax. Do not have throw rugs and other things on the floor that can make you trip. What can I do with my stairs? Do not leave any items on the stairs. Make sure that there are handrails on both sides of the stairs and use them. Fix handrails that are broken or loose. Make sure that handrails are as long as the stairways. Check any carpeting to make sure that it is firmly attached to the stairs. Fix any carpet that is loose or worn. Avoid having throw rugs at the top or bottom of the stairs. If you do have throw rugs, attach them to the floor with carpet tape. Make sure that you have a light switch at the top of the stairs and the bottom of the  stairs. If you do not have them, ask someone to add them for you. What else can I do to help prevent falls? Wear shoes that: Do not have high heels. Have rubber bottoms. Are comfortable and fit you well. Are closed at the toe. Do not wear sandals. If you use a stepladder: Make sure that it is fully opened. Do not climb a closed stepladder. Make sure that both sides of the stepladder are locked into place. Ask someone to hold it for you, if possible. Clearly mark and make sure that you can see: Any grab bars or handrails. First and last steps. Where the edge of each step is. Use tools that help you move around (mobility aids) if they are needed. These include: Canes. Walkers. Scooters. Crutches. Turn on the lights when you go into a dark area. Replace any light bulbs as soon as they burn out. Set up your furniture so you have a clear path. Avoid moving your furniture around. If any of your floors are uneven, fix them. If there are any pets around you, be aware of where they are. Review your medicines with your doctor. Some medicines can make you feel dizzy. This can increase your chance of falling. Ask your doctor what other things that you can do to help prevent falls. This information is not intended to replace advice given to you by your health care provider. Make sure you discuss any questions you have with your health care provider. Document Released: 09/24/2009 Document Revised: 05/05/2016 Document Reviewed: 01/02/2015 Elsevier Interactive Patient Education  2017 [employer].

## 2023-01-19 ENCOUNTER — Encounter (HOSPITAL_COMMUNITY): Payer: Self-pay | Admitting: *Deleted

## 2023-01-25 ENCOUNTER — Telehealth: Payer: Self-pay

## 2023-01-25 NOTE — Telephone Encounter (Signed)
Received clearance but need additional information.

## 2023-01-25 NOTE — Telephone Encounter (Signed)
Was returning call. Please advise

## 2023-01-25 NOTE — Telephone Encounter (Signed)
Pharmacy team can you offer guidance on the listed additional questions regarding medications and need for antibiotics.  Thank you

## 2023-01-25 NOTE — Telephone Encounter (Signed)
   Pre-operative Risk Assessment    Patient Name: Joseph Larsen  DOB: 02-Jul-1946 MRN: [id number]     Request for Surgical Clearance    Procedure:   2 Crowns   Date of Surgery:  Clearance TBD                                 Surgeon:  Dr Isaac Laud Surgeon's Group or Practice Name:  [facility], DDS Phone number:  [phone] Fax number:  [phone]   Type of Clearance Requested:   - Medical  - Pharmacy:  Hold Aspirin     Type of Anesthesia:   Epinephrine   Additional requests/questions:  Does this patient need antibiotics? Is there any contraindication to dental treatment?  Is there  any contraindication to antibiotics? Is there any contraindication to use the epinephrine? Is there any contraindication to pain meds (including narcotics)? Any contraindication with triazolam (halcion) being taken with any other meds? Does the patient require premedication prior to dental treatment, if so how long?  What antibiotics do you recommend and dosage? Does the patient require a waiting period for the dental treatment after medical surgery/ procedure, if so how long?     [name]   01/25/2023, 3:54 PM

## 2023-01-26 NOTE — Telephone Encounter (Signed)
   Patient Name: Joseph Larsen  DOB: 1946/08/29 MRN: [id number]  Primary Cardiologist: Jenkins Rouge, MD  Chart reviewed as part of pre-operative protocol coverage.   We were contacted for guidance regarding placement of crowns.  Pt does not need specific cardiac clearance for placement of crowns. There is no contraindication for epi, PRN pain meds, or benzo use. Recommend uninterrupted ASA. May proceed with crown placement.  SBE prophylaxis is not required for the patient from a cardiac standpoint.  I will route this recommendation to the requesting party via Epic fax function and remove from pre-op pool.  Please call with questions.  [place], PA 01/26/2023, 1:06 PM

## 2023-01-26 NOTE — Telephone Encounter (Signed)
[place] for pt to have crowns placed. Pt does not require SBE ppx with antibiotics. Don't see an issue with epi or as needed pain meds/benzo use. Preop team to address aspirin hold.

## 2023-05-09 NOTE — Progress Notes (Signed)
Cardiology Office Note:    Date:  05/19/2023   ID:  Joseph Larsen, DOB June 24, 1946, MRN [id number]  PCP:  Raliegh Ip, DO  [facility] Cardiologist:  Charlton Haws, MD     Patient Profile:    Joseph Larsen is a 77 y.o. 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 Laneta Simmers; L-LAD, S-Dx, S-RI, S-OM) Paroxysmal >> persistent atrial fibrillation S/p DCCV 05/15/20 >> ERAF Flecainide started 6/21>>DCd 2/2 coronary artery disease  ETT-Myoview - no ischemia; no VT DCCV 06/01/20 >> NSR S/p bi-atrial MAZE and LAA clipping 9/21 Hypertension Hyperlipidemia Alcohol abuse Echo 6/19: EF 60-65 Echo 5/21: EF 55-60, LAE, LVH Echo 12/07/20 EF 60-65% aortic root 4.3 m Carotid artery Dz  [place] 9/21: bilat ICA 1-39      Prior CV studies: Carotid US 08/20/20 B/L ICA 1-39   Cardiac catheterization 08/07/20 LM mid 50 LAD prox 90 RI 90 LCx ost 70, mid 50 RCA prox 30 EF 55-65   Coronary CTA 07/21/20 Ca score 3159 (95th percentile) 1. Left Main:  No significant stenosis. FFR = 0.89 2. LAD: Significant stenosis. FFR 0.76 at takeoff of first diagonal, Mid FFR = 0.66, Distal FFR = 0.58 3. LCX: No significant stenosis. Proximal FFR = 0.86 4. RCA: Unable to be interpreted due to artifact. IMPRESSION: 1. CT FFR analysis showed significant stenosis in the LAD as noted, beginning near the takeoff of the first significant diagonal and extending distally.   ETT-Myoview 05/29/2020 Small inferior apical wall infarct, no ischemia, not gated due to atrial fibrillation, low risk ETT portion normal   Event monitor 05/04/2020 Atrial fibrillation, average heart rate 81, 3.1-second pause, <1% PVCs   Echocardiogram 04/23/2020 EF 55-60, no RWMA, mild LVH, normal RV SF, severe LAE, mildly dilated aortic root (39 mm), dilated ascending aorta (40 mm)  History of Present Illness:    77 y.o. 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 Has f/u with BP clinic and pressures good on current regimen  He knows when he has had PAF in past He does not like to be on ASA/Eliquis as he bruises too easily Discussed fact that ASA more important for grafts. 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   CT in 07/2021: 3.8 cm // Chest/aorta CTA: Ascending aorta 3.5 cm, descending aorta 3.5 cm   BPH (benign prostatic hyperplasia)    Carotid artery disease (HCC)    [place] 9/21: bilat ICA 1-39    Coronary artery disease cardiologist--- dr Eden Emms   nuclear stress test showed low risk no ischemia 05-29-2020;  coronary CTA 07-21-2020 cal score 3159 with disease;  cath 08-07-2020 severe multiple cad;  08-26-2020 S/p CABG x4  and MAZE procedure  LIMA -LAD, SVG -Dx, SVG -RI, SVG -OM)   Depression    Diverticular disease    Dyspnea on exertion    Echocardiogram    Echocardiogram 12/21: EF 60-65, no RWMA, mod asymmetric basal-septal LVH, normal diastolic fn, normal RVSF, RVSP 26, mild MR, mild AV sclerosis (no AS), dilated aortic root (43 mm), dilated ascending aorta (43 mm), no effusion    ETOH abuse    First degree heart block    GERD (gastroesophageal reflux disease)    Gout    per pt last episode 2019   Hemorrhoids    Hiatal hernia    History of 2019 novel coronavirus disease (COVID-19)    11-27-2019 positive result in care  everywhere, per pt mild symptoms that resolved   HTN (hypertension)    Hyperlipidemia 09/10/2020   Low testosterone    PAF (paroxysmal atrial fibrillation) [facility] cardiologist--- dr Eden Emms--- first dx 08/ 2019   failed DCCV in 6/21 x2   // Flecainide Rx >>DC'd 2/2 CAD // s/p Maze + LAA clipping in 9/21 // Apixaban   Palpitations    PTSD (post-traumatic stress disorder)    [place] V[employer] - [employer]   Right inguinal hernia    S/P Maze operation for atrial fibrillation 08/26/2020   at same time cabg    Current Medications: Current Meds  Medication Sig   acetaminophen (TYLENOL) 500 MG tablet Take 1,000 mg by mouth daily as needed for moderate pain or headache.   ALPRAZolam (XANAX) 0.5 MG tablet Take 0.25 mg by mouth at bedtime.    aspirin EC 81 MG tablet Take 1 tablet (81 mg total) by mouth daily. Swallow whole.   atenolol (TENORMIN) 25 MG tablet Take 0.5 tablets (12.5 mg total) by mouth daily.   atorvastatin (LIPITOR) 40 MG tablet Take 1 tablet (40 mg total) by mouth daily.   Cholecalciferol (VITAMIN D) 50 MCG (2000 UT) tablet Take 2,000 Units by mouth daily.   Cyanocobalamin (B-12 PO) Take 2 tablets by mouth 2 (two) times daily.   eplerenone (INSPRA) 25 MG tablet Take 25 mg by mouth daily.   fluticasone (FLONASE) 50 MCG/ACT nasal spray Place 2 sprays into both nostrils 2 (two) times daily as needed for allergies or rhinitis.   isosorbide mononitrate (IMDUR) 30 MG 24 hr tablet Take 1 tablet (30 mg total) by mouth daily.   loratadine (CLARITIN) 10 MG tablet Take 10 mg by mouth daily.   losartan (COZAAR) 100 MG tablet Take 1 tablet (100 mg total) by mouth daily.   nitroGLYCERIN (NITROSTAT) 0.4 MG SL tablet nitroglycerin 0.4 mg sublingual tablet DISSOLVE ONE TABLET UNDER THE TONGUE EVERY 5 MINUTES AS NEEDED FOR CHEST PAIN. DO NOT EXCEED A TOTAL OF 3 DOSES IN 15 MINUTES   Omega-3 Fatty Acids (FISH OIL) 1000 MG CAPS Take 1,000 mg by mouth daily.   omeprazole (PRILOSEC) 20 MG capsule Take 20 mg by  mouth 2 (two) times daily as needed (for heartburn / acid reflux).    QUEtiapine (SEROQUEL) 100 MG tablet Take 25 mg by mouth at bedtime. Pt only takes 1/4 tab   tamsulosin (FLOMAX) 0.4 MG CAPS capsule TAKE 1 CAPSULE BY MOUTH ONCE DAILY AFTER BREAKFAST     Allergies:   Terazosin, Enalapril maleate, Lisinopril, Penicillins, and Adhesive [tape]   Social History   Tobacco Use   Smoking status: Former    Packs/day: 3.00    Years: 20.00    Additional pack years: 0.00    Total pack  years: 60.00    Types: Cigarettes    Start date: 02/09/1965    Quit date: 05/12/1977    Years since quitting: 46.0   Smokeless tobacco: Never  Vaping Use   Vaping Use: Never used  Substance Use Topics   Alcohol use: Not Currently    Alcohol/week: 7.0 - 14.0 standard drinks of alcohol    Types: 7 - 14 Cans of beer per week    Comment: 03-23-2021 per pt 1 to 2 beers daily (hx alcohol abuse   Drug use: Never     Family Hx: The patient's family history includes Heart disease in his father and mother; Heart murmur in his sister; Hypertension in his brother, sister, sister, sister, and sister; Liver disease in his brother. There is no history of Colon cancer.  ROS   EKGs/Labs/Other Test Reviewed:    EKG:   05/19/2023 SB rate 57 ICRBBB voltage for LVH  Recent Labs: 08/10/2022: BUN 12; Creatinine, Ser 0.88; Potassium 4.0; Sodium 140   Recent Lipid Panel Lab Results  Component Value Date/Time   CHOL 77 (L) 11/18/2020 10:05 AM   TRIG 37 11/18/2020 10:05 AM   TRIG 82 03/25/2015 09:50 AM   HDL 32 (L) 11/18/2020 10:05 AM   HDL 38 (L) 03/25/2015 09:50 AM   CHOLHDL 2.4 11/18/2020 10:05 AM   LDLCALC 34 11/18/2020 10:05 AM   LDLCALC 54 07/17/2014 08:28 AM   LDLDIRECT 74 01/03/2017 11:00 AM      Risk Assessment/Calculations:     CHA2DS2-VASc Score =    This indicates a  % annual risk of stroke. The patient's score is based upon:      Physical Exam:    VS:  BP 130/76   Pulse (!) 57   Ht 5\' 11"  (1.803  m)   Wt 184 lb 9.6 oz (83.7 kg)   SpO2 91%   BMI 25.75 kg/m     Wt Readings from Last 3 Encounters:  05/19/23 184 lb 9.6 oz (83.7 kg)  12/23/22 182 lb (82.6 kg)  12/15/22 193 lb (87.5 kg)     Affect appropriate Healthy:  appears stated age HEENT: normal Neck supple with no adenopathy JVP normal no bruits no thyromegaly Lungs clear with no wheezing and good diaphragmatic motion Heart:  S1/S2 no murmur, no rub, gallop or click PMI normal post sternotomy  Abdomen: benighn, BS positve, no tenderness, no AAA no bruit.  No HSM or HJR Distal pulses intact with no bruits No edema Neuro non-focal Skin warm and dry No muscular weakness    ASSESSMENT & PLAN:    1. Essential hypertension - improved on current meds including Inspra, atenolol, imdur, cozaar - low sodium diet   2. CAD/CABG. -08/26/20 doing well with preserved EF resume ASA 81 mg  Beta blocker and statin   4. PAF -Maintaining sinus rhythm.  He is status post Maze procedure and atrial appendage clipping.  D/C eliquis [place] monitoring   5. Hyperlipidemia, unspecified hyperlipidemia type -LDL optimal on most recent lab work.  Continue current Rx.    6. Dilated Aorta:  3.5 cm on CTA 08/17/22 Observe    7. Gynecomastia:  :  improved off aldactone      Dispo:  F/U in a year   Medication Adjustments/Labs and Tests Ordered: Current medicines are reviewed at length with the patient today.  Concerns regarding medicines are outlined above.  Tests Ordered: No orders of the defined types were placed in this encounter.  Medication Changes: No orders of  the defined types were placed in this encounter.   Signed, Charlton Haws, MD  05/19/2023 9:04 AM    [facility] [address] [place], [place], [place]  [address] Phone: [phone]; Fax: [phone]

## 2023-05-19 ENCOUNTER — Encounter: Payer: Self-pay | Admitting: Cardiovascular Disease

## 2023-05-19 ENCOUNTER — Ambulatory Visit: Payer: Medicare Other | Attending: Cardiovascular Disease | Admitting: Cardiovascular Disease

## 2023-05-19 VITALS — BP 130/76 | HR 57 | Ht 71.0 in | Wt 184.6 lb

## 2023-05-19 DIAGNOSIS — I10 Essential (primary) hypertension: Secondary | ICD-10-CM | POA: Insufficient documentation

## 2023-05-19 DIAGNOSIS — I25810 Atherosclerosis of coronary artery bypass graft(s) without angina pectoris: Secondary | ICD-10-CM | POA: Diagnosis not present

## 2023-05-19 DIAGNOSIS — E7800 Pure hypercholesterolemia, unspecified: Secondary | ICD-10-CM | POA: Insufficient documentation

## 2023-05-19 DIAGNOSIS — Z951 Presence of aortocoronary bypass graft: Secondary | ICD-10-CM | POA: Diagnosis not present

## 2023-05-19 DIAGNOSIS — I480 Paroxysmal atrial fibrillation: Secondary | ICD-10-CM | POA: Diagnosis not present

## 2023-05-19 DIAGNOSIS — I77819 Aortic ectasia, unspecified site: Secondary | ICD-10-CM | POA: Diagnosis not present

## 2023-05-19 NOTE — Patient Instructions (Signed)
Medication Instructions:  Your physician recommends that you continue on your current medications as directed. Please refer to the Current Medication list given to you today.  *If you need a refill on your cardiac medications before your next appointment, please call your pharmacy*  Lab Work: If you have labs (blood work) drawn today and your tests are completely normal, you will receive your results only by: MyChart Message (if you have MyChart) OR A paper copy in the mail If you have any lab test that is abnormal or we need to change your treatment, we will call you to review the results.  Testing/Procedures: None ordered today.  Follow-Up: At [facility], you and your health needs are our priority.  As part of our continuing mission to provide you with exceptional heart care, we have created designated Provider Care Teams.  These Care Teams include your primary Cardiologist (physician) and Advanced Practice Providers (APPs -  Physician Assistants and Nurse Practitioners) who all work together to provide you with the care you need, when you need it.  We recommend signing up for the patient portal called "MyChart".  Sign up information is provided on this After Visit Summary.  MyChart is used to connect with patients for Virtual Visits (Telemedicine).  Patients are able to view lab/test results, encounter notes, upcoming appointments, etc.  Non-urgent messages can be sent to your provider as well.   To learn more about what you can do with MyChart, go to [email or web address].    Your next appointment:   1 year(s)  Provider:   Peter Nishan, MD      

## 2023-06-30 DIAGNOSIS — L814 Other melanin hyperpigmentation: Secondary | ICD-10-CM | POA: Diagnosis not present

## 2023-06-30 DIAGNOSIS — D1801 Hemangioma of skin and subcutaneous tissue: Secondary | ICD-10-CM | POA: Diagnosis not present

## 2023-06-30 DIAGNOSIS — L821 Other seborrheic keratosis: Secondary | ICD-10-CM | POA: Diagnosis not present

## 2023-06-30 DIAGNOSIS — Z85828 Personal history of other malignant neoplasm of skin: Secondary | ICD-10-CM | POA: Diagnosis not present

## 2023-06-30 DIAGNOSIS — L570 Actinic keratosis: Secondary | ICD-10-CM | POA: Diagnosis not present

## 2023-08-02 ENCOUNTER — Other Ambulatory Visit: Payer: Self-pay | Admitting: *Deleted

## 2023-08-02 DIAGNOSIS — I77819 Aortic ectasia, unspecified site: Secondary | ICD-10-CM

## 2023-08-02 DIAGNOSIS — I712 Thoracic aortic aneurysm, without rupture, unspecified: Secondary | ICD-10-CM

## 2023-08-24 ENCOUNTER — Ambulatory Visit (HOSPITAL_BASED_OUTPATIENT_CLINIC_OR_DEPARTMENT_OTHER): Payer: Medicare Other

## 2023-08-31 ENCOUNTER — Ambulatory Visit (HOSPITAL_BASED_OUTPATIENT_CLINIC_OR_DEPARTMENT_OTHER)
Admission: RE | Admit: 2023-08-31 | Discharge: 2023-08-31 | Disposition: A | Discharge status: Home / Self Care | Payer: Medicare Other | Source: Ambulatory Visit | Attending: Physician Assistant | Admitting: Physician Assistant

## 2023-08-31 DIAGNOSIS — I712 Thoracic aortic aneurysm, without rupture, unspecified: Secondary | ICD-10-CM | POA: Insufficient documentation

## 2023-08-31 DIAGNOSIS — I517 Cardiomegaly: Secondary | ICD-10-CM | POA: Diagnosis not present

## 2023-08-31 DIAGNOSIS — I700 Atherosclerosis of aorta: Secondary | ICD-10-CM | POA: Diagnosis not present

## 2023-08-31 DIAGNOSIS — K449 Diaphragmatic hernia without obstruction or gangrene: Secondary | ICD-10-CM | POA: Diagnosis not present

## 2023-08-31 LAB — POCT I-STAT CREATININE: Creatinine, Ser: 0.9 mg/dL (ref 0.61–1.24)

## 2023-08-31 MED ORDER — IOHEXOL 350 MG/ML SOLN
100.0000 mL | Freq: Once | INTRAVENOUS | Status: AC | PRN
  Administered 2023-08-31: 75 mL via INTRAVENOUS

## 2024-01-30 ENCOUNTER — Ambulatory Visit: Payer: Medicare Other | Admitting: Family Medicine

## 2024-01-30 ENCOUNTER — Ambulatory Visit: Payer: Self-pay | Admitting: Family Medicine

## 2024-01-30 ENCOUNTER — Encounter: Payer: Self-pay | Admitting: Family Medicine

## 2024-01-30 VITALS — BP 145/85 | HR 73 | Temp 98.7°F | Wt 193.2 lb

## 2024-01-30 DIAGNOSIS — J029 Acute pharyngitis, unspecified: Secondary | ICD-10-CM

## 2024-01-30 LAB — POCT RAPID STREP A (OFFICE): Rapid Strep A Screen: NEGATIVE

## 2024-01-30 LAB — POC INFLUENZA A&B (BINAX/QUICKVUE)
Influenza A, POC: NEGATIVE
Influenza B, POC: NEGATIVE

## 2024-01-30 LAB — POC COVID19 BINAXNOW: SARS Coronavirus 2 Ag: NEGATIVE

## 2024-01-30 MED ORDER — AZITHROMYCIN 250 MG PO TABS: ORAL_TABLET | ORAL | 0 refills | Status: AC

## 2024-01-30 MED ORDER — BENZONATATE 200 MG PO CAPS: 200.0000 mg | ORAL_CAPSULE | Freq: Two times a day (BID) | ORAL | 0 refills | Status: DC | PRN

## 2024-01-30 NOTE — Telephone Encounter (Signed)
Nevermind looks like he seen Dr. Claiborne Billings today

## 2024-01-30 NOTE — Telephone Encounter (Signed)
Copied from CRM [id number]. Topic: Clinical - Red Word Triage >> Jan 30, 2024  9:24 AM Elle L wrote: Red Word that prompted transfer to Nurse Triage: The patient's wife, Purcell Jungbluth, states that the patient started getting sick on Friday. She believes it is the flu. He is congested and has a worsening coughing and discolored mucus.  Chief Complaint: Sore throat Symptoms: Sore throat, cough, congestion, mild SOB Frequency: Since Friday night Pertinent Negatives: Patient denies fever Disposition: [] ED /[] Urgent Care (no appt availability in office) / [x] Appointment(In office/virtual)/ []  [facility]/ [] Home Care/ [] Refused Recommended Disposition /[] [facility]/ []  Follow-up with PCP Additional Notes: Patient's wife called in on behalf of her husband who has been experiencing a sore throat and other cold/flu symptoms. Patient rated the pain in his throat an 8, at this time. Patient stated he is able to swallow fluids, but it burns. Patient is also experiencing a productive cough and congestion. Patient stated his chest hurts from coughing. Patient stated he has mild SOB mostly when moving around, but occasionally at rest. Patient stated he is still able to take deep breaths. Patient able to speak in clear and complete sentences while on the phone with this RN. No audible work of breathing or wheezing detected. Patient denied fever. This RN advised patient to see a provider today. No availability with PCP or in PCP office. Scheduled same day appointment at an alternate office. This RN advised patient to call back if symptoms worsen. Patient complied.   Reason for Disposition  SEVERE (e.g., excruciating) throat pain  Answer Assessment - Initial Assessment Questions 1. ONSET: "When did the throat start hurting?" (Hours or days ago)      Friday night into Saturday 2. SEVERITY: "How bad is the sore throat?" (Scale 1-10; mild, moderate or severe)   - MILD (1-3):  Doesn't interfere with  eating or normal activities.   - MODERATE (4-7): Interferes with eating some solids and normal activities.   - SEVERE (8-10):  Excruciating pain, interferes with most normal activities.   - SEVERE WITH DYSPHAGIA (10): Can't swallow liquids, drooling.     States he is able to drink liquids, but it does hurt, rates pain an 8 at this time 3. STREP EXPOSURE: "Has there been any exposure to strep within the past week?" If Yes, ask: "What type of contact occurred?"      Denies 4.  VIRAL SYMPTOMS: "Are there any symptoms of a cold, such as a runny nose, cough, hoarse voice or red eyes?"      Congestion and cough 5. FEVER: "Do you have a fever?" If Yes, ask: "What is your temperature, how was it measured, and when did it start?"     Denies 6. PUS ON THE TONSILS: "Is there pus on the tonsils in the back of your throat?"     Denies 7. OTHER SYMPTOMS: "Do you have any other symptoms?" (e.g., difficulty breathing, headache, rash)     SOB when he's walking around, SOB at times when sitting, still able to take deep breaths, chest hurts when coughing  Protocols used: Sore Throat-A-AH

## 2024-01-30 NOTE — Telephone Encounter (Signed)
Joseph Larsen, put him in for a virtual visit with me today and I will call in the next few minutes

## 2024-01-30 NOTE — Patient Instructions (Signed)
Your strep, covid and influenza test are negative I called in tessalon perles for the cough.  I also called in a z-pack for you. I would wait until Thursday to start z-pack and use only if symptoms are not improving or worsen.   This is currently most likely a viral illness.  Rest and hydrate well.   Viral Illness, Adult Viruses are tiny germs that can get into a person's body and cause illness. There are many different types of viruses. And they cause many types of illness. Viral illnesses can range from mild to severe. They can affect various parts of the body. Short-term conditions that are caused by a virus include colds and flu (influenza) and stomach viruses. Long-term conditions that are caused by a virus include herpes, shingles, and human immunodeficiency virus (HIV) infection. A few viruses have been linked to certain cancers. What are the causes? Many types of viruses can cause illness. Viruses get into cells in your body, multiply, and cause the infected cells to work differently or die. When these cells die, they release more of the virus. When this happens, you get symptoms of the illness and the virus spreads to other cells. If the virus takes over how the cell works, it can cause the cell to divide and grow out of control. This happens when a virus causes cancer. Different viruses get into the body in different ways. You can get a virus by: Swallowing food or water that has come in contact with the virus. Breathing in droplets that have been coughed or sneezed into the air by an infected person. Touching a surface that has the virus on it and then touching your eyes, nose, or mouth. Being bitten by an insect or animal that carries the virus. Having sexual contact with a person who is infected with the virus. Being exposed to blood or fluids that contain the virus, either through an open cut or during a transfusion. If a virus enters your body, your body's disease-fighting system  (immune system) will try to fight the virus. You may be at higher risk for a viral illness if your immune system is weak. What are the signs or symptoms? Symptoms depend on the type of virus and the location of the cells that it gets into. Symptoms can include: For cold and flu viruses: Fever. Headache. Sore throat. Muscle aches. Stuffy nose (nasal congestion). Cough. For stomach (gastrointestinal) viruses: Fever. Pain in the abdomen. Nausea or vomiting. Diarrhea. For liver viruses (hepatitis): Loss of appetite. Feeling tired. Skin or the white parts of your eyes turning yellow (jaundice). For brain and spinal cord viruses: Fever. Headache. Stiff neck. Nausea and vomiting. Confusion or being sleepy. For skin viruses: Warts. Itching. Rash. For sexually transmitted viruses: Discharge. Swelling. Redness. Rash. How is this diagnosed? This condition may be diagnosed based on one or more of these: Your symptoms and medical history. A physical exam. Tests, such as: Blood tests. Tests on a sample of mucus from your lungs (sputum sample). Tests on a poop (stool) sample. Tests on a swab of body fluids or a skin sore (lesion). How is this treated? Viruses can be hard to treat because they live within cells. Antibiotics do not treat viruses because these medicines do not get inside cells. Treatment for a viral illness may include: Resting and drinking a lot of fluids. Medicines to treat symptoms. These can include over-the-counter medicine for pain and fever, medicines for cough or congestion, and medicines for diarrhea. Antiviral medicines. These  medicines are available only for certain types of viruses. Some viral illnesses can be prevented with vaccinations. A common example is the flu shot. Follow these instructions at home: Medicines Take over-the-counter and prescription medicines only as told by your health care provider. If you were prescribed an antiviral medicine,  take it as told by your provider. Do not stop taking the antiviral even if you start to feel better. Know when antibiotics are needed and when they are not needed. Antibiotics do not treat viruses. You may get an antibiotic if your provider thinks that you may have, or are at risk for, a bacterial infection and you have a viral infection. Do not ask for an antibiotic prescription if you have been diagnosed with a viral illness. Antibiotics will not make your illness go away faster. Taking antibiotics when they are not needed can lead to antibiotic resistance. When this develops, the medicine no longer works against the bacteria that it normally fights. General instructions Drink enough fluids to keep your pee (urine) pale yellow. Rest as much as possible. Return to your normal activities as told by your provider. Ask your provider what activities are safe for you. How is this prevented? To lower your risk of getting another viral illness: Wash your hands often with soap and water for at least 20 seconds. If soap and water are not available, use hand sanitizer. Avoid touching your nose, eyes, and mouth, especially if you have not washed your hands recently. If anyone in your household has a viral infection, clean all household surfaces that may have been in contact with the virus. Use soap and hot water. You may also use a commercially prepared, bleach-containing solution. Stay away from people who are sick with symptoms of a viral infection. Do not share items such as toothbrushes and water bottles with other people. Keep your vaccinations up to date. This includes getting a yearly flu shot. Eat a healthy diet and get plenty of rest. Contact a health care provider if: You have symptoms of a viral illness that do not go away. Your symptoms come back after going away. Your symptoms get worse. Get help right away if: You have trouble breathing. You have a severe headache or a stiff neck. You  have severe vomiting or pain in your abdomen. These symptoms may be an emergency. Get help right away. Call 911. Do not wait to see if the symptoms will go away. Do not drive yourself to the hospital. This information is not intended to replace advice given to you by your health care provider. Make sure you discuss any questions you have with your health care provider. Document Revised: 12/14/2022 Document Reviewed: 09/28/2022 Elsevier Patient Education  2024 [employer].

## 2024-01-30 NOTE — Progress Notes (Signed)
Joseph Larsen , [birth year], 78 y.o., male MRN: [id number] Patient Care Team    Relationship Specialty Notifications Start End  Raliegh Ip, [place] PCP - General Family Medicine  03/18/20   Wendall Stade, MD PCP - Cardiology Cardiology  05/02/18   Meryl Dare, MD (Inactive) Consulting Physician Gastroenterology  01/03/17   Elyn Aquas, MD Consulting Physician Gastroenterology  01/03/17     Chief Complaint  Patient presents with   Sore Throat    Since Saturday/Sunday; denies any other Sx     Subjective: Joseph Larsen is a 78 y.o. Pt presents for an OV with complaints of burning/scratchy throat 4 days duration.  Patient reports he experiences respiratory illnesses that typically start with a sore throat and then dropped into his chest easily.  He recently was exposed to somebody who was ill, no testing was completed for that person. Patient endorses a scratchy cough, not productive.   He is taking Coricidin to help with his symptoms.     12/23/2022    2:06 PM 12/15/2022    9:29 AM 03/01/2022   10:04 AM 10/04/2021    2:08 PM 08/31/2021    2:44 PM  Depression screen PHQ 2/9  Decreased Interest 0 0 0 0 0  Down, Depressed, Hopeless 0 0 0 0 0  PHQ - 2 Score 0 0 0 0 0  Altered sleeping 0 0  0   Tired, decreased energy 0 0  0   Change in appetite 0 0  0   Feeling bad or failure about yourself  0 0  0   Trouble concentrating 0 0  0   Moving slowly or fidgety/restless 0 0  0   Suicidal thoughts 0 0  0   PHQ-9 Score 0 0  0   Difficult doing work/chores Not difficult at all Not difficult at all  Not difficult at all     Allergies  Allergen Reactions   Terazosin Rash    Rash when in sun   Enalapril Maleate Rash   Lisinopril Itching and Rash   Penicillins Itching and Rash    35 years ago    Adhesive [Tape] Rash    bandaids    Social History   Social History Narrative   Lives with wife in [place] in a one story home.  Exercises @ gym 3x/wk - stationary bike  and light weight training.   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   CT in 07/2021: 3.8 cm // Chest/aorta CTA: Ascending aorta 3.5 cm, descending aorta 3.5 cm   BPH (benign prostatic hyperplasia)    Carotid artery disease (HCC)    [place] 9/21: bilat ICA 1-39    Coronary artery disease cardiologist--- dr Eden Emms   nuclear stress test showed low risk no ischemia 05-29-2020;  coronary CTA 07-21-2020 cal score 3159 with disease;  cath 08-07-2020 severe multiple cad;  08-26-2020 S/p CABG x4  and MAZE procedure  LIMA -LAD, SVG -Dx, SVG -RI, SVG -OM)   Depression    Diverticular disease    Dyspnea on exertion    Echocardiogram    Echocardiogram 12/21: EF 60-65, no RWMA, mod asymmetric basal-septal LVH, normal diastolic fn, normal RVSF, RVSP 26, mild MR, mild AV sclerosis (no AS), dilated aortic root (43 mm), dilated ascending aorta (43 mm), no effusion    ETOH  abuse    First degree heart block    GERD (gastroesophageal reflux disease)    Gout    per pt last episode 2019   Hemorrhoids    Hiatal hernia    History of 2019 novel coronavirus disease (COVID-19)    11-27-2019 positive result in care everywhere, per pt mild symptoms that resolved   HTN (hypertension)    Hyperlipidemia 09/10/2020   Low testosterone    PAF (paroxysmal atrial fibrillation) [facility]) cardiologist--- dr Eden Emms--- first dx 08/ 2019   failed DCCV in 6/21 x2   // Flecainide Rx >>DC'd 2/2 CAD // s/p Maze + LAA clipping in 9/21 // Apixaban   Palpitations    PTSD (post-traumatic stress disorder)    [place] Vet - [employer]   Right inguinal hernia    S/P Maze operation for atrial fibrillation 08/26/2020   at same time cabg   Past Surgical History:  Procedure Laterality Date   CARDIOVERSION N/A 05/15/2020   Procedure: CARDIOVERSION;  Surgeon: Quintella Reichert, MD;  Location: [facility] ENDOSCOPY;  Service: Cardiovascular;  Laterality: N/A;   CARDIOVERSION  N/A 06/01/2020   Procedure: CARDIOVERSION;  Surgeon: Parke Poisson, MD;  Location: [facility] ENDOSCOPY;  Service: Cardiovascular;  Laterality: N/A;   CLIPPING OF ATRIAL APPENDAGE N/A 08/26/2020   Procedure: CLIPPING OF ATRIAL APPENDAGE;  Surgeon: Alleen Borne, MD;  Location: [facility] OR;  Service: Open Heart Surgery;  Laterality: N/A;   CORONARY ARTERY BYPASS GRAFT N/A 08/26/2020   Procedure: CORONARY ARTERY BYPASS GRAFTING TIMES FOUR USING LEFT INTERNAL MAMMARY ARTERY AND ENDOSCOPICALLY HARVESTED RIGHT GREATER SAPHENOUS VEIN;  Surgeon: Alleen Borne, MD;  Location: [facility] OR;  Service: Open Heart Surgery;  Laterality: N/A;   ESOPHAGOGASTRODUODENOSCOPY  last one 03-24-2020  @[facility]    INGUINAL HERNIA REPAIR Right 03/29/2021   Procedure: OPEN REPAIR RIGHT INGUINAL HERNIA WITH MESH;  Surgeon: Darnell Level, MD;  Location: [facility];  Service: General;  Laterality: Right;  60 MINUTES   KNEE ARTHROSCOPY Bilateral 2010;  2012   LAPAROSCOPIC CHOLECYSTECTOMY  08-31-2005  @[facility]    LEFT HEART CATH AND CORONARY ANGIOGRAPHY N/A 08/07/2020   Procedure: LEFT HEART CATH AND CORONARY ANGIOGRAPHY;  Surgeon: Swazil[place], Peter M, MD;  Location: [facility] INVASIVE CV LAB;  Service: Cardiovascular;  Laterality: N/A;   MAZE N/A 08/26/2020   Procedure: MAZE;  Surgeon: Alleen Borne, MD;  Location: [facility] OR;  Service: Open Heart Surgery;  Laterality: N/A;   SHOULDER ARTHROSCOPY Right 01/14/2020   TEE WITHOUT CARDIOVERSION N/A 08/26/2020   Procedure: TRANSESOPHAGEAL ECHOCARDIOGRAM (TEE);  Surgeon: Alleen Borne, MD;  Location: [facility] OR;  Service: Open Heart Surgery;  Laterality: N/A;   Family History  Problem Relation Age of Onset   Heart disease Mother        "died of chf"   Heart disease Father        "died of old age"   Hypertension Sister    Liver disease Brother    Hypertension Brother    Hypertension Sister    Hypertension Sister    Hypertension Sister    Heart murmur Sister    Colon cancer Neg Hx    Allergies as of  01/30/2024       Reactions   Terazosin Rash   Rash when in sun   Enalapril Maleate Rash   Lisinopril Itching, Rash   Penicillins Itching, Rash   35 years ago   Adhesive [tape] Rash   bandaids         Medication  List        Accurate as of January 30, 2024 12:09 PM. If you have any questions, ask your nurse or doctor.          acetaminophen 500 MG tablet Commonly known as: TYLENOL Take 1,000 mg by mouth daily as needed for moderate pain or headache.   ALPRAZolam 0.5 MG tablet Commonly known as: XANAX Take 0.25 mg by mouth at bedtime.   aspirin EC 81 MG tablet Take 1 tablet (81 mg total) by mouth daily. Swallow whole.   atenolol 25 MG tablet Commonly known as: TENORMIN Take 0.5 tablets (12.5 mg total) by mouth daily.   atorvastatin 40 MG tablet Commonly known as: Lipitor Take 1 tablet (40 mg total) by mouth daily.   azithromycin 250 MG tablet Commonly known as: ZITHROMAX Take 2 tablets on day 1, then 1 tablet daily on days 2 through 5 Started by: Felix Pacini   B-12 PO Take 2 tablets by mouth 2 (two) times daily.   benzonatate 200 MG capsule Commonly known as: TESSALON Take 1 capsule (200 mg total) by mouth 2 (two) times daily as needed for cough. Started by: Felix Pacini   eplerenone 25 MG tablet Commonly known as: INSPRA Take 25 mg by mouth daily.   Fish Oil 1000 MG Caps Take 1,000 mg by mouth daily.   fluticasone 50 MCG/ACT nasal spray Commonly known as: FLONASE Place 2 sprays into both nostrils 2 (two) times daily as needed for allergies or rhinitis.   isosorbide mononitrate 30 MG 24 hr tablet Commonly known as: IMDUR Take 1 tablet (30 mg total) by mouth daily.   loratadine 10 MG tablet Commonly known as: CLARITIN Take 10 mg by mouth daily.   losartan 100 MG tablet Commonly known as: COZAAR Take 1 tablet (100 mg total) by mouth daily.   nitroGLYCERIN 0.4 MG SL tablet Commonly known as: NITROSTAT nitroglycerin 0.4 mg sublingual tablet  DISSOLVE ONE TABLET UNDER THE TONGUE EVERY 5 MINUTES AS NEEDED FOR CHEST PAIN. DO NOT EXCEED A TOTAL OF 3 DOSES IN 15 MINUTES   omeprazole 20 MG capsule Commonly known as: PRILOSEC Take 20 mg by mouth 2 (two) times daily as needed (for heartburn / acid reflux).   QUEtiapine 100 MG tablet Commonly known as: SEROQUEL Take 25 mg by mouth at bedtime. Pt only takes 1/4 tab   tamsulosin 0.4 MG Caps capsule Commonly known as: FLOMAX TAKE 1 CAPSULE BY MOUTH ONCE DAILY AFTER BREAKFAST   Vitamin D 50 MCG (2000 UT) tablet Take 2,000 Units by mouth daily.        All past medical history, surgical history, allergies, family history, immunizations andmedications were updated in the EMR today and reviewed under the history and medication portions of their EMR.     Review of Systems  Constitutional:  Negative for chills, fever and malaise/fatigue.  HENT:  Positive for congestion and sore throat. Negative for ear pain and sinus pain.   Eyes:  Negative for pain and discharge.  Respiratory:  Positive for cough. Negative for sputum production, shortness of breath and wheezing.   Gastrointestinal:  Negative for diarrhea, nausea and vomiting.  Musculoskeletal:  Negative for myalgias.  Neurological:  Negative for dizziness and headaches.   Negative, with the exception of above mentioned in HPI   Objective:  BP (!) 145/85   Pulse 73   Temp 98.7 F (37.1 C)   Wt 193 lb 3.2 oz (87.6 kg)   SpO2 96%   BMI 26.95  kg/m  Body mass index is 26.95 kg/m. Physical Exam Vitals and nursing note reviewed.  Constitutional:      General: He is not in acute distress.    Appearance: Normal appearance. He is not ill-appearing, toxic-appearing or diaphoretic.  HENT:     Head: Normocephalic and atraumatic.     Right Ear: Tympanic membrane, ear canal and external ear normal.     Left Ear: Tympanic membrane, ear canal and external ear normal.     Nose: Congestion and rhinorrhea present.     Mouth/Throat:      Pharynx: Posterior oropharyngeal erythema present. No oropharyngeal exudate.  Eyes:     General: No scleral icterus.       Right eye: No discharge.        Left eye: No discharge.     Extraocular Movements: Extraocular movements intact.     Pupils: Pupils are equal, round, and reactive to light.  Cardiovascular:     Rate and Rhythm: Normal rate and regular rhythm.  Pulmonary:     Effort: Pulmonary effort is normal. No respiratory distress.     Breath sounds: Normal breath sounds. No wheezing, rhonchi or rales.  Musculoskeletal:     Cervical back: Neck supple.  Lymphadenopathy:     Cervical: No cervical adenopathy.  Skin:    General: Skin is warm and dry.     Coloration: Skin is not jaundiced or pale.     Findings: No rash.  Neurological:     Mental Status: He is alert and oriented to person, place, and time. Mental status is at baseline.  Psychiatric:        Mood and Affect: Mood normal.        Behavior: Behavior normal.        Thought Content: Thought content normal.        Judgment: Judgment normal.     No results found. No results found. Results for orders placed or performed in visit on 01/30/24 (from the past 24 hours)  POC COVID-19 BinaxNow     Status: Normal   Collection Time: 01/30/24 12:07 PM  Result Value Ref Range   SARS Coronavirus 2 Ag Negative Negative  POC Influenza A&B (Binax test)     Status: Normal   Collection Time: 01/30/24 12:07 PM  Result Value Ref Range   Influenza A, POC Negative Negative   Influenza B, POC Negative Negative  POCT rapid strep A     Status: Normal   Collection Time: 01/30/24 12:07 PM  Result Value Ref Range   Rapid Strep A Screen Negative Negative    Assessment/Plan: Joseph Larsen is a 78 y.o. male present for OV for  Cough/pharyngitis This is currently most likely a viral illness.  His vitals are stable.  He does have significant erythema of his throat present.   strep, covid and influenza test are negative tessalon perles  for the cough.  Can continue Coricidin Rest, hydrate.  Z-Pak prescribed patient understands this is likely a viral illness and antibiotics would not be helpful at this time.  However if symptoms are not improving by Thursday or are worsening he has to start Z-Pak.  Patient reports understanding F/U w/ PCP in  2 weeks of not improved, sooner if worsening.    Reviewed expectations re: course of current medical issues. Discussed self-management of symptoms. Outlined signs and symptoms indicating need for more acute intervention. Patient verbalized understanding and all questions were answered. Patient received an After-Visit Summary.  Orders Placed This Encounter  Procedures   POC COVID-19 BinaxNow   POC Influenza A&B (Binax test)   POCT rapid strep A   Meds ordered this encounter  Medications   azithromycin (ZITHROMAX) 250 MG tablet    Sig: Take 2 tablets on day 1, then 1 tablet daily on days 2 through 5    Dispense:  6 tablet    Refill:  0   benzonatate (TESSALON) 200 MG capsule    Sig: Take 1 capsule (200 mg total) by mouth 2 (two) times daily as needed for cough.    Dispense:  20 capsule    Refill:  0   Referral Orders  No referral(s) requested today     Note is dictated utilizing voice recognition software. Although note has been proof read prior to signing, occasional typographical errors still can be missed. If any questions arise, please do not hesitate to call for verification.   electronically signed by:  Felix Pacini, DO  [facility] [place]

## 2024-03-28 ENCOUNTER — Telehealth: Payer: Self-pay | Admitting: Cardiovascular Disease

## 2024-03-28 NOTE — Telephone Encounter (Signed)
 Spoke to patient and he reports that over the last few days he has noticed that he has not been able to yawn. Admits to having the sensation, but unable to do so. Pt states that he has been noticing shortness of breath, slight chest tightness on left side, but denies swelling. Pt states that he does not feel like he is in A. Fib. BP reading this morning before medicine was 166/95 and HR of 55. Pt reports that he has been taking all medications as prescribed.   BP over the last few days:  144/79 138/88 163/96 120/77 128/74   Advised pt that he needs to be evaluated and asked pt to go to ER. Pt declined. Appt made with DOD tomorrow. Pt states that he will go to ER if symptoms progress.

## 2024-03-28 NOTE — Telephone Encounter (Signed)
 Pt called in stating he cannot yawn and is concerned after some research that it may be related to his heart. He denies any other symptoms. Please advise.

## 2024-03-29 ENCOUNTER — Ambulatory Visit: Attending: Cardiology | Admitting: Cardiology

## 2024-03-29 VITALS — BP 134/82 | HR 56 | Resp 16 | Ht 71.0 in | Wt 189.0 lb

## 2024-03-29 DIAGNOSIS — E7800 Pure hypercholesterolemia, unspecified: Secondary | ICD-10-CM | POA: Diagnosis present

## 2024-03-29 DIAGNOSIS — R0602 Shortness of breath: Secondary | ICD-10-CM

## 2024-03-29 DIAGNOSIS — I77819 Aortic ectasia, unspecified site: Secondary | ICD-10-CM

## 2024-03-29 DIAGNOSIS — I10 Essential (primary) hypertension: Secondary | ICD-10-CM | POA: Diagnosis not present

## 2024-03-29 DIAGNOSIS — I480 Paroxysmal atrial fibrillation: Secondary | ICD-10-CM

## 2024-03-29 DIAGNOSIS — Z951 Presence of aortocoronary bypass graft: Secondary | ICD-10-CM

## 2024-03-29 DIAGNOSIS — I25810 Atherosclerosis of coronary artery bypass graft(s) without angina pectoris: Secondary | ICD-10-CM

## 2024-03-29 NOTE — Progress Notes (Signed)
 Cardiology Office Note:     ID:  HALIL RENTZ, DOB [birth year], MRN [id number]  PCP:  Jolinda Norene HERO, DO  [facility] Cardiologist:  Maude Emmer, MD    Chief Complaint  Patient presents with   Shortness of Breath   Follow-up    Sick visit   Patient Profile:    Joseph Larsen is a 78 y.o. 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 Lucas; L-LAD, S-Dx, S-RI, S-OM) Paroxysmal >> persistent atrial fibrillation S/p DCCV 05/15/20 >> ERAF Flecainide  started 6/21>>DCd 2/2 coronary artery disease  ETT-Myoview  - no ischemia; no VT DCCV 06/01/20 >> NSR S/p bi-atrial MAZE and LAA clipping 9/21 Hypertension Hyperlipidemia Alcohol abuse Echo 6/19: EF 60-65 Echo 5/21: EF 55-60, LAE, LVH Echo 12/07/20 EF 60-65% aortic root 4.3 m Carotid artery Dz  US  9/21: bilat ICA 1-39      Prior CV studies: Carotid US  08/20/20 B/L ICA 1-39   Cardiac catheterization 08/07/20 LM mid 50 LAD prox 90 RI 90 LCx ost 70, mid 50 RCA prox 30 EF 55-65   Coronary CTA 07/21/20 Ca score 3159 (95th percentile) 1. Left Main:  No significant stenosis. FFR = 0.89 2. LAD: Significant stenosis. FFR 0.76 at takeoff of first diagonal, Mid FFR = 0.66, Distal FFR = 0.58 3. LCX: No significant stenosis. Proximal FFR = 0.86 4. RCA: Unable to be interpreted due to artifact. IMPRESSION: 1. CT FFR analysis showed significant stenosis in the LAD as noted, beginning near the takeoff of the first significant diagonal and extending distally.   ETT-Myoview  05/29/2020 Small inferior apical wall infarct, no ischemia, not gated due to atrial fibrillation, low risk ETT portion normal   Event monitor 05/04/2020 Atrial fibrillation, average heart rate 81, 3.1-second pause, <1% PVCs   Echocardiogram 04/23/2020 EF 55-60, no RWMA, mild LVH, normal RV SF, severe LAE, mildly dilated aortic root (39 mm), dilated ascending aorta (40 mm)  History of Present Illness:    78 y.o. complex past cardiovascular history  as mentioned above presents today for sick visit with the DOD for evaluation of shortness of breath.    For the past week, he has been experiencing frequent urges to yawn but is unable to complete the yawn, which he describes as feeling like he needs more oxygen. This sensation is particularly bothersome at night, affecting his ability to sleep. He also has mild shortness of breath. No chest pain, lightheadedness, dizziness, or syncope.  He has a history of atrial fibrillation and is currently managed with a baby aspirin , atenolol , atorvastatin , Imdur , and losartan . He has not required nitroglycerin  tablets recently. He monitors his AFib with a kardia mobile device and has not experienced atrial fibrillation since his heart surgery performed by Dr. Lucas, which was successful in preventing recurrence. He participated in shared decision making with his primary cardiologist to come off of oral anticoagulation.   He engages in physical activities such as yard work, mowing, and weed eating, and does not avoid exercise due to symptoms. He denies any recent changes in his physical activity tolerance.  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   CT in 07/2021: 3.8 cm // Chest/aorta CTA: Ascending aorta 3.5 cm, descending aorta 3.5 cm   BPH (benign prostatic hyperplasia)    Carotid artery disease (HCC)    US  9/21: bilat ICA 1-39    Coronary artery disease cardiologist---  dr delford   nuclear stress test showed low risk no ischemia 05-29-2020;  coronary CTA 07-21-2020 cal score 3159 with disease;  cath 08-07-2020 severe multiple cad;  08-26-2020 S/p CABG x4  and MAZE procedure  LIMA -LAD, SVG -Dx, SVG -RI, SVG -OM)   Depression    Diverticular disease    Dyspnea on exertion    Echocardiogram    Echocardiogram 12/21: EF 60-65, no RWMA, mod asymmetric basal-septal LVH, normal diastolic fn, normal RVSF, RVSP 26, mild MR,  mild AV sclerosis (no AS), dilated aortic root (43 mm), dilated ascending aorta (43 mm), no effusion    ETOH abuse    First degree heart block    GERD (gastroesophageal reflux disease)    Gout    per pt last episode 2019   Hemorrhoids    Hiatal hernia    History of 2019 novel coronavirus disease (COVID-19)    11-27-2019 positive result in care everywhere, per pt mild symptoms that resolved   HTN (hypertension)    Hyperlipidemia 09/10/2020   Low testosterone     PAF (paroxysmal atrial fibrillation) [facility]) cardiologist--- dr delford--- first dx 08/ 2019   failed DCCV in 6/21 x2   // Flecainide  Rx >>DC'd 2/2 CAD // s/p Maze + LAA clipping in 9/21 // Apixaban    Palpitations    PTSD (post-traumatic stress disorder)    [place] [employer]   Right inguinal hernia    S/P Maze operation for atrial fibrillation 08/26/2020   at same time cabg    Current Medications: Current Meds  Medication Sig   acetaminophen  (TYLENOL ) 500 MG tablet Take 1,000 mg by mouth daily as needed for moderate pain or headache.   ALPRAZolam  (XANAX ) 0.5 MG tablet Take 0.25 mg by mouth at bedtime.    aspirin  EC 81 MG tablet Take 1 tablet (81 mg total) by mouth daily. Swallow whole.   atenolol  (TENORMIN ) 25 MG tablet Take 0.5 tablets (12.5 mg total) by mouth daily.   atorvastatin  (LIPITOR) 40 MG tablet Take 1 tablet (40 mg total) by mouth daily.   Cholecalciferol (VITAMIN D ) 50 MCG (2000 UT) tablet Take 2,000 Units by mouth daily.   Cyanocobalamin  (B-12 PO) Take 2 tablets by mouth 2 (two) times daily.   eplerenone (INSPRA) 25 MG tablet Take 25 mg by mouth daily.   fluticasone  (FLONASE ) 50 MCG/ACT nasal spray Place 2 sprays into both nostrils 2 (two) times daily as needed for allergies or rhinitis.   isosorbide  mononitrate (IMDUR ) 30 MG 24 hr tablet Take 1 tablet (30 mg total) by mouth daily.   loratadine  (CLARITIN ) 10 MG tablet Take 10 mg by mouth daily.   losartan  (COZAAR ) 100 MG tablet Take 1 tablet (100 mg total) by  mouth daily.   nitroGLYCERIN  (NITROSTAT ) 0.4 MG SL tablet nitroglycerin  0.4 mg sublingual tablet DISSOLVE ONE TABLET UNDER THE TONGUE EVERY 5 MINUTES AS NEEDED FOR CHEST PAIN. DO NOT EXCEED A TOTAL OF 3 DOSES IN 15 MINUTES   Omega-3 Fatty Acids (FISH OIL) 1000 MG CAPS Take 1,000 mg by mouth daily.   omeprazole (PRILOSEC) 20 MG capsule Take 20 mg by mouth 2 (two) times daily as needed (for heartburn / acid reflux).    QUEtiapine  (SEROQUEL ) 100 MG tablet Take 25 mg by mouth at bedtime. Pt only takes 1/4 tab   tamsulosin  (FLOMAX ) 0.4 MG CAPS capsule TAKE 1 CAPSULE BY MOUTH ONCE DAILY AFTER BREAKFAST     Allergies:   Terazosin, Enalapril maleate, Lisinopril, Penicillins, and Adhesive [tape]  Social History   Tobacco Use   Smoking status: Former    Current packs/day: 0.00    Average packs/day: 3.0 packs/day for 20.0 years (59.9 ttl pk-yrs)    Types: Cigarettes    Start date: 02/09/1965    Quit date: 05/12/1977    Years since quitting: 46.9   Smokeless tobacco: Never  Vaping Use   Vaping status: Never Used  Substance Use Topics   Alcohol use: Not Currently    Alcohol/week: 7.0 - 14.0 standard drinks of alcohol    Types: 7 - 14 Cans of beer per week    Comment: 03-23-2021 per pt 1 to 2 beers daily (hx alcohol abuse   Drug use: Never     Family Hx: The patient's family history includes Heart disease in his father and mother; Heart murmur in his sister; Hypertension in his brother, sister, sister, sister, and sister; Liver disease in his brother. There is no history of Colon cancer.  Review of Systems  Cardiovascular:  Positive for dyspnea on exertion. Negative for chest pain, claudication, irregular heartbeat, leg swelling, near-syncope, orthopnea, palpitations, paroxysmal nocturnal dyspnea and syncope.  Respiratory:  Positive for shortness of breath.   Hematologic/Lymphatic: Negative for bleeding problem.     EKGs/Labs/Other Test Reviewed:    EKG:    EKG  Interpretation Date/Time:  Friday March 29 2024 08:42:57 EDT Ventricular Rate:  54 PR Interval:  232 QRS Duration:  96 QT Interval:  472 QTC Calculation: 447 R Axis:   -25  Text Interpretation: Sinus bradycardia with 1st degree A-V block Minimal voltage criteria for LVH, may be normal variant ( R in aVL ) When compared with ECG of 27-Aug-2020 06:33, No significant change since last tracing Confirmed by Michele Richardson [phone]) on 03/29/2024 8:47:41 AM  Recent Labs: 08/31/2023: Creatinine, Ser 0.90   Recent Lipid Panel Lab Results  Component Value Date/Time   CHOL 77 (L) 11/18/2020 10:05 AM   TRIG 37 11/18/2020 10:05 AM   TRIG 82 03/25/2015 09:50 AM   HDL 32 (L) 11/18/2020 10:05 AM   HDL 38 (L) 03/25/2015 09:50 AM   CHOLHDL 2.4 11/18/2020 10:05 AM   LDLCALC 34 11/18/2020 10:05 AM   LDLCALC 54 07/17/2014 08:28 AM   LDLDIRECT 74 01/03/2017 11:00 AM   Risk Assessment/Calculations:     Click Here to Calculate/Change CHADS2VASc Score The patient's CHADS2-VASc score is 4, indicating a 4.8% annual risk of stroke.   CHF History: No HTN History: Yes Diabetes History: No Stroke History: No Vascular Disease History: Yes   Physical Exam:    VS:  BP 134/82 (BP Location: Left Arm, Patient Position: Sitting, Cuff Size: Normal)   Pulse (!) 56   Resp 16   Ht 5' 11 (1.803 m)   Wt 189 lb (85.7 kg)   SpO2 97%   BMI 26.36 kg/m     Wt Readings from Last 3 Encounters:  03/29/24 189 lb (85.7 kg)  01/30/24 193 lb 3.2 oz (87.6 kg)  05/19/23 184 lb 9.6 oz (83.7 kg)     Affect appropriate Healthy:  appears stated age HEENT: normal Neck supple with no adenopathy JVP normal no bruits no thyromegaly Lungs clear with no wheezing and good diaphragmatic motion Heart:  S1/S2 no murmur, no rub, gallop or click PMI normal post sternotomy  Abdomen: benighn, BS positve, no tenderness, no AAA no bruit.  No HSM or HJR Distal pulses intact with no bruits No edema Neuro non-focal Skin warm and  dry No muscular weakness  ASSESSMENT & PLAN:      ICD-10-CM   1. Shortness of breath  R06.02 EKG 12-Lead    Cardiac Stress Test: Informed Consent Details: Physician/Practitioner Attestation; Transcribe to consent form and obtain patient signature    2. Coronary artery disease involving coronary bypass graft of native heart without angina pectoris  I25.810 ECHOCARDIOGRAM COMPLETE    MYOCARDIAL PERFUSION IMAGING    Cardiac Stress Test: Informed Consent Details: Physician/Practitioner Attestation; Transcribe to consent form and obtain patient signature    3. Hx of CABG  Z95.1 ECHOCARDIOGRAM COMPLETE    MYOCARDIAL PERFUSION IMAGING    Cardiac Stress Test: Informed Consent Details: Physician/Practitioner Attestation; Transcribe to consent form and obtain patient signature    4. Essential hypertension  I10     5. PAF (paroxysmal atrial fibrillation) (HCC)  I48.0     6. Pure hypercholesterolemia  E78.00     7. Dilation of aorta (HCC)  I77.819      Shortness of breath Coronary artery disease involving coronary bypass graft of native heart without angina pectoris Hx of CABG Intermittent dyspnea and frequent yawning feels like a lack of oxygen.  Denies chest pain, dizziness, or syncope.  EKG is nonischemic.   Low suspicion for cardiac causes -discussed monitoring symptoms versus undergoing additional testing given his prior cardiac history and no recent ischemic evaluation for reference.  Shared decision was to proceed forward with additional testing at this time.   - Order echocardiogram to assess cardiac function. - Schedule stress test to evaluate for coronary artery disease. - Advise hospital care if chest pain or worsening dyspnea occurs. - Advise to bring recent cholesterol results from the [place] to next appointment. - Reemphasized the importance of secondary prevention with focus on improving her modifiable cardiovascular risk factors such as glycemic control, lipid management,  blood pressure control.   Essential hypertension Office blood pressures are very well-controlled. Continue atenolol  25 mg half a tablet p.o. daily. Continue Imdur  30 mg p.o. daily. Continue losartan  100 mg p.o. daily. Spironolactone  discontinued in the past due to gynecomastia.  PAF (paroxysmal atrial fibrillation) (HCC) Remains in normal sinus rhythm. Currently not on anticoagulation as discussed with his primary cardiologist in the past and monitoring A-fib with Kardia mobile app.   Patient appreciates when he goes in and out of A-fib. Rate controlled: Atenolol . Rhythm control: N/A.  Pure hypercholesterolemia Currently on Lipitor 40 mg p.o. nightly Does not endorse myalgias. Recommended to have fasting lipids checked with PCP.  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  Last CTA in September 2024 illustrates maximum diameter to be 3.8 cm and recommended discontinuation of surveillance.  Will try to arrange outpatient follow-up with his primary cardiologist in 3 months to reevaluate symptoms, sooner if diagnostic workup is abnormal.  Patient and wife are agreeable with the plan of care and thankful for being seen at a short notice.  Informed Consent   Shared Decision Making/Informed Consent The risks [chest pain, shortness of breath, cardiac arrhythmias, dizziness, blood pressure fluctuations, myocardial infarction, stroke/transient ischemic attack, nausea, vomiting, allergic reaction, radiation exposure, metallic taste sensation and life-threatening complications (estimated to be 1 in 10,000)], benefits (risk stratification, diagnosing coronary artery disease, treatment guidance) and alternatives of a nuclear stress test were discussed in detail with Mr. Regala and he agrees to proceed.     Medication Adjustments/Labs and Tests Ordered: Current medicines are reviewed at length with the patient today.  Concerns regarding medicines are outlined above.   Tests Ordered: Orders  Placed  This Encounter  Procedures   Cardiac Stress Test: Informed Consent Details: Physician/Practitioner Attestation; Transcribe to consent form and obtain patient signature   MYOCARDIAL PERFUSION IMAGING   EKG 12-Lead   ECHOCARDIOGRAM COMPLETE   Medication Changes: No orders of the defined types were placed in this encounter.   Madonna Large, DO, [facility]  [facility]  [address] [place], [place] [address]

## 2024-03-29 NOTE — Patient Instructions (Addendum)
 Medication Instructions:  Your physician recommends that you continue on your current medications as directed. Please refer to the Current Medication list given to you today.  *If you need a refill on your cardiac medications before your next appointment, please call your pharmacy*  Lab Work: None ordered today. If you have labs (blood work) drawn today and your tests are completely normal, you will receive your results only by: MyChart Message (if you have MyChart) OR A paper copy in the mail If you have any lab test that is abnormal or we need to change your treatment, we will call you to review the results.  Testing/Procedures: Your physician has requested that you have en exercise stress myoview . For further information please visit [email or web address] Please follow instruction sheet, as given.  Your physician has requested that you have an echocardiogram. Echocardiography is a painless test that uses sound waves to create images of your heart. It provides your doctor with information about the size and shape of your heart and how well your heart's chambers and valves are working. This procedure takes approximately one hour. There are no restrictions for this procedure. Please do NOT wear cologne, perfume, aftershave, or lotions (deodorant is allowed). Please arrive 15 minutes prior to your appointment time.  Please note: We ask at that you not bring children with you during ultrasound (echo/ vascular) testing. Due to room size and safety concerns, children are not allowed in the ultrasound rooms during exams. Our front office staff cannot provide observation of children in our lobby area while testing is being conducted. An adult accompanying a patient to their appointment will only be allowed in the ultrasound room at the discretion of the ultrasound technician under special circumstances. We apologize for any inconvenience.   Follow-Up: At [facility], you and your health needs are  our priority.  As part of our continuing mission to provide you with exceptional heart care, we have created designated Provider Care Teams.  These Care Teams include your primary Cardiologist (physician) and Advanced Practice Providers (APPs -  Physician Assistants and Nurse Practitioners) who all work together to provide you with the care you need, when you need it.  We recommend signing up for the patient portal called MyChart.  Sign up information is provided on this After Visit Summary.  MyChart is used to connect with patients for Virtual Visits (Telemedicine).  Patients are able to view lab/test results, encounter notes, upcoming appointments, etc.  Non-urgent messages can be sent to your provider as well.   To learn more about what you can do with MyChart, go to [email or web address].    Your next appointment:   3 month(s)  The format for your next appointment:   In Person  Provider:   Maude Emmer, MD{  Other Instructions Exercise/Lexiscan  Myoview  (Stress Test) Instructions  Please arrive 15 minutes prior to your appointment time for registration and insurance purposes.   The test will take approximately 3 to 4 hours to complete; you may bring reading material.  If someone comes with you to your appointment, they will need to remain in the main lobby due to limited space in the testing area. **If you are pregnant or breastfeeding, please notify the nuclear lab prior to your appointment**   How to prepare for your Myocardial Perfusion Test: Do not eat or drink 3 hours prior to your test, except you may have water. Do not consume products containing caffeine (regular or decaffeinated) 12 hours prior to your test. (ex: coffee, chocolate,  sodas, tea). Do bring a list of your current medications with you.  If not listed below, you may take your medications as normal. HOLD Atenolol  (Tenormin ) the morning of your scan. Bring this medication with you to the scan for if they want you to  take this after the scan is complete. Do wear comfortable clothes (no dresses or overalls) and walking shoes, tennis shoes preferred (No heels or open toe shoes are allowed). Do NOT wear cologne, perfume, aftershave, or lotions (deodorant is allowed). If these instructions are not followed, your test will have to be rescheduled.   Please report to [address], [address] for your test.  If you have questions or concerns about your appointment, you can call the Nuclear Lab at [phone].   If you cannot keep your appointment, please provide 24 hours notification to the Nuclear Lab, to avoid a possible $50 charge to your account. --------------------------------------------------------------------------------------   1st Floor: - Lobby - Registration  - Pharmacy  - Lab - Cafe  2nd Floor: - PV Lab - Diagnostic Testing (echo, CT, nuclear med)  3rd Floor: - Vacant  4th Floor: - TCTS (cardiothoracic surgery) - AFib Clinic - Structural Heart Clinic - Vascular Surgery  - Vascular Ultrasound  5th Floor: - HeartCare Cardiology (general and EP) - Clinical Pharmacy for coumadin, hypertension, lipid, weight-loss medications, and med management appointments    Valet parking services will be available as well.

## 2024-03-31 ENCOUNTER — Encounter: Payer: Self-pay | Admitting: Cardiology

## 2024-04-02 ENCOUNTER — Encounter (HOSPITAL_COMMUNITY): Payer: Self-pay

## 2024-04-08 ENCOUNTER — Telehealth (HOSPITAL_COMMUNITY): Payer: Self-pay | Admitting: *Deleted

## 2024-04-08 NOTE — Telephone Encounter (Signed)
 Patient's wife, Per dpr, given instructions for upcoming stress test.  Argentina Bees

## 2024-04-09 ENCOUNTER — Other Ambulatory Visit: Payer: Self-pay | Admitting: Cardiology

## 2024-04-09 DIAGNOSIS — I25810 Atherosclerosis of coronary artery bypass graft(s) without angina pectoris: Secondary | ICD-10-CM

## 2024-04-09 DIAGNOSIS — Z951 Presence of aortocoronary bypass graft: Secondary | ICD-10-CM

## 2024-04-10 ENCOUNTER — Ambulatory Visit (HOSPITAL_COMMUNITY): Attending: Cardiovascular Disease

## 2024-04-10 ENCOUNTER — Ambulatory Visit (HOSPITAL_COMMUNITY)

## 2024-04-10 DIAGNOSIS — Z951 Presence of aortocoronary bypass graft: Secondary | ICD-10-CM | POA: Insufficient documentation

## 2024-04-10 DIAGNOSIS — I25810 Atherosclerosis of coronary artery bypass graft(s) without angina pectoris: Secondary | ICD-10-CM | POA: Diagnosis not present

## 2024-04-10 DIAGNOSIS — I2510 Atherosclerotic heart disease of native coronary artery without angina pectoris: Secondary | ICD-10-CM | POA: Diagnosis not present

## 2024-04-10 LAB — ECHOCARDIOGRAM COMPLETE
AR max vel: 4.96 cm2
AV Area VTI: 4.97 cm2
AV Area mean vel: 4.63 cm2
AV Mean grad: 2 mmHg
AV Peak grad: 4.2 mmHg
Ao pk vel: 1.02 m/s
Area-P 1/2: 3.77 cm2
Calc EF: 58.1 %
Height: 71 in
S' Lateral: 3.9 cm
Single Plane A2C EF: 52.6 %
Single Plane A4C EF: 63.5 %
Weight: 3024 [oz_av]

## 2024-04-10 MED ORDER — TECHNETIUM TC 99M TETROFOSMIN IV KIT
25.6000 | PACK | Freq: Once | INTRAVENOUS | Status: AC | PRN
  Administered 2024-04-10: 25.6 via INTRAVENOUS

## 2024-04-10 MED ORDER — TECHNETIUM TC 99M TETROFOSMIN IV KIT
8.8000 | PACK | Freq: Once | INTRAVENOUS | Status: AC | PRN
  Administered 2024-04-10: 8.8 via INTRAVENOUS

## 2024-04-12 DIAGNOSIS — R0602 Shortness of breath: Secondary | ICD-10-CM

## 2024-04-12 LAB — MYOCARDIAL PERFUSION IMAGING
Angina Index: 0
Duke Treadmill Score: 6
Estimated workload: 7.5
Exercise duration (min): 6 min
Exercise duration (sec): 20 s
LV dias vol: 111 mL (ref 62–150)
LV sys vol: 37 mL
MPHR: 144 {beats}/min
Nuc Stress EF: 65 %
Peak HR: 133 {beats}/min
Percent HR: 92 %
Rest HR: 56 {beats}/min
Rest Nuclear Isotope Dose: 8.8 mCi
SDS: 0
SRS: 0
SSS: 0
ST Depression (mm): 0 mm
Stress Nuclear Isotope Dose: 25.6 mCi
TID: 1.06

## 2024-04-15 ENCOUNTER — Telehealth: Payer: Self-pay | Admitting: Cardiovascular Disease

## 2024-04-15 NOTE — Telephone Encounter (Signed)
 Informed patient of his results and testing to have done.

## 2024-04-15 NOTE — Telephone Encounter (Signed)
 Patient returned RN's call.

## 2024-04-16 ENCOUNTER — Other Ambulatory Visit (HOSPITAL_COMMUNITY): Payer: Self-pay | Admitting: Family Medicine

## 2024-04-16 ENCOUNTER — Other Ambulatory Visit (HOSPITAL_BASED_OUTPATIENT_CLINIC_OR_DEPARTMENT_OTHER): Payer: Self-pay

## 2024-04-16 ENCOUNTER — Other Ambulatory Visit: Payer: Self-pay | Admitting: Cardiovascular Disease

## 2024-04-16 ENCOUNTER — Ambulatory Visit (HOSPITAL_BASED_OUTPATIENT_CLINIC_OR_DEPARTMENT_OTHER)

## 2024-04-16 DIAGNOSIS — R0602 Shortness of breath: Secondary | ICD-10-CM

## 2024-04-16 MED ORDER — SHINGRIX 50 MCG/0.5ML IM SUSR
0.5000 mL | Freq: Once | INTRAMUSCULAR | 0 refills | Status: AC
Start: 2024-04-16 — End: 2024-04-17
  Filled 2024-04-16: qty 0.5, 1d supply, fill #0

## 2024-04-17 ENCOUNTER — Telehealth: Payer: Self-pay | Admitting: Cardiovascular Disease

## 2024-04-17 LAB — COMPREHENSIVE METABOLIC PANEL WITH GFR
ALT: 19 IU/L (ref 0–44)
AST: 21 IU/L (ref 0–40)
Albumin: 4.2 g/dL (ref 3.8–4.8)
Alkaline Phosphatase: 63 IU/L (ref 44–121)
BUN/Creatinine Ratio: 6 — ABNORMAL LOW (ref 10–24)
BUN: 5 mg/dL — ABNORMAL LOW (ref 8–27)
Bilirubin Total: 0.7 mg/dL (ref 0.0–1.2)
CO2: 21 mmol/L (ref 20–29)
Calcium: 8.5 mg/dL — ABNORMAL LOW (ref 8.6–10.2)
Chloride: 101 mmol/L (ref 96–106)
Creatinine, Ser: 0.77 mg/dL (ref 0.76–1.27)
Globulin, Total: 1.9 g/dL (ref 1.5–4.5)
Glucose: 87 mg/dL (ref 70–99)
Potassium: 3.8 mmol/L (ref 3.5–5.2)
Sodium: 140 mmol/L (ref 134–144)
Total Protein: 6.1 g/dL (ref 6.0–8.5)
eGFR: 93 mL/min/{1.73_m2} (ref >59)

## 2024-04-17 LAB — PRO B NATRIURETIC PEPTIDE: NT-Pro BNP: 293 pg/mL (ref 0–486)

## 2024-04-17 NOTE — Telephone Encounter (Signed)
 Patient identification verified by 2 forms. Joseph Duck, RN     Called and spoke to patient  Patient states:  - His BUN, Creatinine and calcium  showed up in mychart as abnormal. He would like to know what Dr. Stann Earnest thinks he should do.   Patient denies:              - Any abnormal symptoms at time of call.   Interventions/Plan: - Reviewed provider recommendations for lab results as follows:   Joseph Rule, MD 04/17/2024  8:20 AM EDT Back to Top    Labs fine no evidence CHF   No changes to regime or medications at this time.   Patient agrees with plan, no questions at this time

## 2024-04-17 NOTE — Telephone Encounter (Signed)
 Pt called in to discuss lab results with Oralee Billow, RN. He viewed via mychart but he has some additional f/u questions.

## 2024-04-18 ENCOUNTER — Telehealth: Payer: Self-pay | Admitting: Cardiovascular Disease

## 2024-04-18 DIAGNOSIS — R0602 Shortness of breath: Secondary | ICD-10-CM

## 2024-04-18 NOTE — Telephone Encounter (Signed)
 Pt c/o Shortness Of Breath: STAT if SOB developed within the last 24 hours or pt is noticeably SOB on the phone  1. Are you currently SOB (can you hear that pt is SOB on the phone)? No  2. How long have you been experiencing SOB?   3. Are you SOB when sitting or when up moving around? Both  4. Are you currently experiencing any other symptoms? Pt states that still no improvement even after all the testing which was normal. Requesting cb to further discuss

## 2024-04-18 NOTE — Telephone Encounter (Signed)
 Spoke with wife and she states patient went outside and is unavailable.   She states he is still experiencing SOB. All of his test came back normal but he still feels like he can't get a good breath. Mostly with exertion. Denies any chest pain. They would like to know what else you recommend

## 2024-04-19 NOTE — Telephone Encounter (Signed)
 Loyde Rule, MD to Joseph Shearer R, LPN  Me    12/17/08  2:47 PM Stress test low risk , EF normal CXR NAD. Check d dimer to [facility]/o PE can f/u with me or PA in office 4 weeks Don't see indication for cath but can discuss  Patient aware of Dr. Francie Irani advisement. Patient will get D-Dimer to Larsen/o PE today and follow up with PA on 05/17/24.

## 2024-04-24 ENCOUNTER — Other Ambulatory Visit (HOSPITAL_COMMUNITY): Payer: Self-pay | Admitting: Family Medicine

## 2024-04-24 ENCOUNTER — Telehealth: Payer: Self-pay | Admitting: Cardiology

## 2024-04-24 DIAGNOSIS — R0602 Shortness of breath: Secondary | ICD-10-CM | POA: Diagnosis not present

## 2024-04-24 NOTE — Telephone Encounter (Signed)
 Received phone call from Janie at [facility] regarding the NM Stress Test Over read from 04/10/2024. 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 Renal Cell Carcinoma needs to be excluded. Correlate with CT w/Contrast recommended."   Will send message to Dr. Stann Earnest and Valentina Gasman, NP.

## 2024-04-24 NOTE — Telephone Encounter (Signed)
 Per DOD (Dr. [place]), this needs to go to primary Cardiologist for follow up/orders as it is non urgent.

## 2024-04-24 NOTE — Telephone Encounter (Signed)
 Caller [facility]) reporting out of range results.

## 2024-04-25 ENCOUNTER — Ambulatory Visit: Payer: Self-pay | Admitting: Cardiovascular Disease

## 2024-04-25 LAB — D-DIMER, QUANTITATIVE: D-DIMER: 0.21 mg{FEU}/L (ref 0.00–0.49)

## 2024-04-26 ENCOUNTER — Telehealth: Payer: Self-pay | Admitting: Family Medicine

## 2024-04-26 NOTE — Telephone Encounter (Signed)
 Patient wants to see Dr Bonnell Butcher and she doesn'Larsen have any openings until July 18th. Can nurse work him in?   Copied from CRM [id number]. Topic: Appointments - Appointment Scheduling >> Apr 26, 2024  9:04 AM Joseph Larsen wrote: Patient/patient representative is calling to schedule an appointment. Refer to attachments for appointment information. Please f/u with patient to schedule a sooner appt as a mass was located on left kidney via echcardiogram per cardiologist. No appt available for his provider before July 18th

## 2024-04-26 NOTE — Telephone Encounter (Signed)
 I spoke with pt's wife (on [place]) and scheduled pt with Lugenia Said since PCP is booked so far out. Pt scheduled 5/21 at 8:50.

## 2024-04-26 NOTE — Telephone Encounter (Signed)
 Spoke with patient and he is aware to follow up with PCP

## 2024-04-26 NOTE — Telephone Encounter (Signed)
 Spoke with patient and he wanted to confirm if his PCP would be able to see results. Results routed to PCP

## 2024-04-26 NOTE — Telephone Encounter (Signed)
 Pt requesting cb, has additional questions

## 2024-04-29 ENCOUNTER — Ambulatory Visit: Payer: Self-pay | Admitting: Cardiology

## 2024-05-01 ENCOUNTER — Ambulatory Visit (INDEPENDENT_AMBULATORY_CARE_PROVIDER_SITE_OTHER): Admitting: Family Medicine

## 2024-05-01 ENCOUNTER — Ambulatory Visit: Payer: Self-pay | Admitting: Family Medicine

## 2024-05-01 ENCOUNTER — Encounter: Payer: Self-pay | Admitting: Family Medicine

## 2024-05-01 ENCOUNTER — Ambulatory Visit (HOSPITAL_BASED_OUTPATIENT_CLINIC_OR_DEPARTMENT_OTHER)
Admission: RE | Admit: 2024-05-01 | Discharge: 2024-05-01 | Disposition: A | Discharge status: Home / Self Care | Source: Ambulatory Visit | Attending: Family Medicine | Admitting: Family Medicine

## 2024-05-01 VITALS — BP 138/74 | HR 64 | Temp 98.0°F | Ht 71.0 in | Wt 189.2 lb

## 2024-05-01 DIAGNOSIS — N2889 Other specified disorders of kidney and ureter: Secondary | ICD-10-CM | POA: Insufficient documentation

## 2024-05-01 LAB — MICROSCOPIC EXAMINATION
Bacteria, UA: NONE SEEN
Epithelial Cells (non renal): NONE SEEN /HPF (ref 0–10)
RBC, Urine: NONE SEEN /HPF (ref 0–2)
Renal Epithel, UA: NONE SEEN /HPF
WBC, UA: NONE SEEN /HPF (ref 0–5)
Yeast, UA: NONE SEEN

## 2024-05-01 LAB — BMP8+EGFR
BUN/Creatinine Ratio: 12 (ref 10–24)
BUN: 10 mg/dL (ref 8–27)
CO2: 22 mmol/L (ref 20–29)
Calcium: 9.2 mg/dL (ref 8.6–10.2)
Chloride: 101 mmol/L (ref 96–106)
Creatinine, Ser: 0.84 mg/dL (ref 0.76–1.27)
Glucose: 80 mg/dL (ref 70–99)
Potassium: 4.1 mmol/L (ref 3.5–5.2)
Sodium: 141 mmol/L (ref 134–144)
eGFR: 90 mL/min/{1.73_m2} (ref >59)

## 2024-05-01 LAB — URINALYSIS, ROUTINE W REFLEX MICROSCOPIC
Bilirubin, UA: NEGATIVE
Glucose, UA: NEGATIVE
Ketones, UA: NEGATIVE
Leukocytes,UA: NEGATIVE
Nitrite, UA: NEGATIVE
RBC, UA: NEGATIVE
Specific Gravity, UA: 1.02 (ref 1.005–1.030)
Urobilinogen, Ur: 2 mg/dL — ABNORMAL HIGH (ref 0.2–1.0)
pH, UA: 7 (ref 5.0–7.5)

## 2024-05-01 MED ORDER — IOHEXOL 300 MG/ML  SOLN
100.0000 mL | Freq: Once | INTRAMUSCULAR | Status: AC | PRN
  Administered 2024-05-01: 100 mL via INTRAVENOUS

## 2024-05-01 NOTE — Progress Notes (Addendum)
   Acute Office Visit  Subjective:     Patient ID: Joseph Larsen, male    DOB: 1946-09-18, 78 y.o.   MRN: [id number]  Chief Complaint  Patient presents with   abnormal imaging    HPI Patient is in today for follow up of a renal mass. This was noted as an incidental finding on a myocardial perfusion/CT that was done at the end of April. Per radiology report:  "IMPRESSION: Solid mass in the internal pole cortical medial region of the left kidney 2.6 x 2.7 cm consistent with a solid renal mass in the renal cell carcinoma needs to be excluded. Correlation with CT with contrast recommended."  Denies fever, urinary symptoms, flank pain, edema, weight loss, night sweats.   ROS As per HPI.      Objective:    BP 138/74   Pulse 64   Temp 98 F (36.7 C) (Temporal)   Ht 5\' 11"  (1.803 m)   Wt 189 lb 3.2 oz (85.8 kg)   SpO2 95%   BMI 26.39 kg/m   Wt Readings from Last 3 Encounters:  05/01/24 189 lb 3.2 oz (85.8 kg)  04/10/24 189 lb (85.7 kg)  03/29/24 189 lb (85.7 kg)     Physical Exam Vitals and nursing note reviewed.  Constitutional:      General: He is not in acute distress.    Appearance: He is not ill-appearing, toxic-appearing or diaphoretic.  Pulmonary:     Effort: Pulmonary effort is normal. No respiratory distress.  Skin:    General: Skin is warm and dry.  Neurological:     General: No focal deficit present.     Mental Status: He is alert and oriented to person, place, and time.  Psychiatric:        Mood and Affect: Mood normal.        Behavior: Behavior normal.     No results found for any visits on 05/01/24.      Assessment & Plan:   Joseph Larsen was seen today for abnormal imaging.  Diagnoses and all orders for this visit:  Left renal mass Reviewed myocardial perfusion report. Incidental finding on imaging. Per imaging report: "Solid mass in the internal pole cortical medial region of the left kidney 2.6 x 2.7 cm consistent with a solid renal mass in  the renal cell carcinoma needs to be excluded. Correlation with CT with contrast recommended.". Labs ordered as below. Last GFR and creatinine were normal. UA negative today. Will notify patient of CT report when available and plan of care pending report.  -     BMP8+EGFR -     Urinalysis, Routine w reflex microscopic -     CT ABDOMEN PELVIS W CONTRAST; Future  The patient indicates understanding of these issues and agrees with the plan.  Albertha Huger, FNP

## 2024-05-16 NOTE — Progress Notes (Unsigned)
 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   CT in 07/2021: 3.8 cm // Chest/aorta CTA: Ascending aorta 3.5 cm, descending aorta 3.5 cm   BPH (benign prostatic hyperplasia)    Carotid artery disease (HCC)    US  9/21: bilat ICA 1-39    Coronary artery disease cardiologist--- dr Stann Earnest   nuclear stress test showed low risk no ischemia 05-29-2020;  coronary CTA 07-21-2020 cal score 3159 with disease;  cath 08-07-2020 severe multiple cad;  08-26-2020 S/p CABG x4  and MAZE procedure  LIMA -LAD, SVG -Dx, SVG -RI, SVG -OM)   Depression    Diverticular disease    Dyspnea on exertion    Echocardiogram    Echocardiogram 12/21: EF 60-65, no RWMA, mod asymmetric basal-septal LVH, normal diastolic fn, normal RVSF, RVSP 26, mild MR, mild AV sclerosis (no AS), dilated aortic root (43 mm), dilated ascending aorta (43 mm), no effusion    ETOH abuse    First degree heart block    GERD (gastroesophageal reflux disease)    Gout    per pt last episode 2019   Hemorrhoids    Hiatal hernia    History of 2019 novel coronavirus disease (COVID-19)    11-27-2019 positive result in care everywhere, per pt mild symptoms that resolved   HTN (hypertension)    Hyperlipidemia 09/10/2020   Low testosterone     PAF (paroxysmal atrial fibrillation) [facility]) cardiologist--- dr Stann Earnest--- first dx 08/ 2019   failed DCCV in 6/21 x2   // Flecainide  Rx >>DC'd 2/2 CAD // s/p Maze + LAA clipping in 9/21 // Apixaban    Palpitations    PTSD (post-traumatic stress disorder)    [place] [employer]et - [employer]   Right inguinal hernia    S/P Maze operation for atrial fibrillation 08/26/2020   at same time cabg    History of  Present Illness  Joseph Larsen is a 78 y.o. male with a PMH of CAD s/p CABG 08/2020 with maze and LAA clipping, persistent AF, HTN, HLD, EtOH abuse, carotid stenosis (bilateral 1-39%), GERD, PTSD, depression who presents today for 79-month follow-up.  Joseph Larsen has been followed by Dr. Stann Earnest    Patient denies chest pain, palpitations, dyspnea, PND, orthopnea, nausea, vomiting, dizziness, syncope, edema, weight gain, or early satiety.   Discussed the use of AI scribe software for clinical note transcription with the patient, who gave verbal consent to proceed.  History of Present Illness    ***Notes:   Review of Systems  Please see the history of present illness.    All other systems reviewed and are otherwise negative except as noted above.  Physical Exam     Wt Readings from Last 3 Encounters:  05/01/24 189 lb 3.2 oz (85.8 kg)  04/10/24 189 lb (85.7 kg)  03/29/24 189 lb (85.7 kg)   HQ:IONGE were no vitals filed for this visit.,There is no height or weight on file to calculate BMI. GEN: Well nourished, well developed in no acute distress Neck: No JVD; No carotid bruits Pulmonary: Clear to auscultation without rales, wheezing or rhonchi  Cardiovascular: Normal rate. Regular rhythm. Normal S1. Normal S2.   Murmurs: There  is no murmur.  ABDOMEN: Soft, non-tender, non-distended EXTREMITIES:  No edema; No deformity   EKG/LABS/ Recent Cardiac Studies   ECG personally reviewed by me today - ***  Risk Assessment/Calculations:   {Does this patient have ATRIAL FIBRILLATION?:[phone]}      Lab Results  Component Value Date   WBC 5.5 11/18/2020   HGB 15.0 03/29/2021   HCT 44.0 03/29/2021   MCV 90 11/18/2020   PLT 152 11/18/2020   Lab Results  Component Value Date   CREATININE 0.84 05/01/2024   BUN 10 05/01/2024   NA 141 05/01/2024   K 4.1 05/01/2024   CL 101 05/01/2024   CO2 22 05/01/2024   Lab Results  Component Value Date   CHOL 77 (L) 11/18/2020   HDL 32 (L)  11/18/2020   LDLCALC 34 11/18/2020   LDLDIRECT 74 01/03/2017   TRIG 37 11/18/2020   CHOLHDL 2.4 11/18/2020    Lab Results  Component Value Date   HGBA1C 5.0 08/20/2020   Assessment & Plan    Assessment and Plan Assessment & Plan     1.***  2.***  3.***  4.***      Disposition: Follow-up with Janelle Mediate, MD or APP in *** months {Are you ordering a CV Procedure (e.g. stress test, cath, DCCV, TEE, etc)?   Press F2        :[id number]}   Signed, Francene Ing, Retha Cast, NP 05/16/2024, 8:38 AM [facility]

## 2024-05-17 ENCOUNTER — Encounter: Payer: Self-pay | Admitting: Nurse Practitioner

## 2024-05-17 ENCOUNTER — Ambulatory Visit: Attending: Nurse Practitioner | Admitting: Nurse Practitioner

## 2024-05-17 VITALS — BP 128/72 | Ht 71.0 in | Wt 194.8 lb

## 2024-05-17 DIAGNOSIS — R5383 Other fatigue: Secondary | ICD-10-CM | POA: Diagnosis not present

## 2024-05-17 DIAGNOSIS — I480 Paroxysmal atrial fibrillation: Secondary | ICD-10-CM

## 2024-05-17 DIAGNOSIS — R0602 Shortness of breath: Secondary | ICD-10-CM | POA: Diagnosis not present

## 2024-05-17 DIAGNOSIS — N2889 Other specified disorders of kidney and ureter: Secondary | ICD-10-CM

## 2024-05-17 DIAGNOSIS — E7800 Pure hypercholesterolemia, unspecified: Secondary | ICD-10-CM

## 2024-05-17 DIAGNOSIS — I25810 Atherosclerosis of coronary artery bypass graft(s) without angina pectoris: Secondary | ICD-10-CM

## 2024-05-17 DIAGNOSIS — I10 Essential (primary) hypertension: Secondary | ICD-10-CM | POA: Diagnosis not present

## 2024-05-17 MED ORDER — NITROGLYCERIN 0.4 MG SL SUBL: SUBLINGUAL_TABLET | SUBLINGUAL | 3 refills | Status: AC

## 2024-05-17 NOTE — Patient Instructions (Signed)
 Medication Instructions:  START EITHER MELATONIN 5-10 MG OR MAGNESIUM  250 MG TAKE ONE TABLET AT BEDTIME DECREASE XANAX  0.5 MG TAKE ONE TABLET THREE TIMES WEEKLY IF THIS DOES NOT HELP [facility]TACT [facility]    Follow-Up: At [facility], you and your health needs are our priority.  As part of our continuing mission to provide you with exceptional heart care, our providers are all part of one team.  This team includes your primary Cardiologist (physician) and Advanced Practice Providers or APPs (Physician Assistants and Nurse Practitioners) who all work together to provide you with the care you need, when you need it.  Your next appointment:   KEEP NEXT APPOINTMENT  Provider:   Janelle Mediate, MD    We recommend signing up for the patient portal called "MyChart".  Sign up information is provided on this After Visit Summary.  MyChart is used to connect with patients for Virtual Visits (Telemedicine).  Patients are able to view lab/test results, encounter notes, upcoming appointments, etc.  Non-urgent messages can be sent to your provider as well.   To learn more about what you can do with MyChart, go to [email or web address].

## 2024-06-24 NOTE — Progress Notes (Signed)
 Cardiology Office Note:    Date:  07/04/2024   ID:  Joseph Larsen, DOB 11-Oct-1946, MRN [id number]  PCP:  Joseph Norene HERO, DO  [facility] Cardiologist:  Joseph Emmer, MD     Patient Profile:    Joseph Larsen is a 78 y.o. 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 Lucas; L-LAD, S-Dx, S-RI, S-OM) Paroxysmal >> persistent atrial fibrillation S/p DCCV 05/15/20 >> ERAF Flecainide  started 6/21>>DCd 2/2 coronary artery disease  ETT-Myoview  - no ischemia; no VT DCCV 06/01/20 >> NSR S/p bi-atrial MAZE and LAA clipping 9/21 Hypertension Hyperlipidemia Alcohol abuse Echo 6/19: EF 60-65 Echo 5/21: EF 55-60, LAE, LVH Echo 12/07/20 EF 60-65% aortic root 4.3 m Carotid artery Dz  US  9/21: bilat ICA 1-39      Prior CV studies: Carotid US  08/20/20 B/L ICA 1-39   Cardiac catheterization 08/07/20 LM mid 50 LAD prox 90 RI 90 LCx ost 70, mid 50 RCA prox 30 EF 55-65   Coronary CTA 07/21/20 Ca score 3159 (95th percentile) 1. Left Main:  No significant stenosis. FFR = 0.89 2. LAD: Significant stenosis. FFR 0.76 at takeoff of first diagonal, Mid FFR = 0.66, Distal FFR = 0.58 3. LCX: No significant stenosis. Proximal FFR = 0.86 4. RCA: Unable to be interpreted due to artifact. IMPRESSION: 1. CT FFR analysis showed significant stenosis in the LAD as noted, beginning near the takeoff of the first significant diagonal and extending distally.   ETT-Myoview  04/12/24   Findings are consistent with a single segment of mid inferolateral wall mild ischemia. The study is low risk given normal EF and small defect size.   LV perfusion is abnormal. There is evidence of ischemia. There is no evidence of infarction. Defect 1: There is a small defect with mild reduction in uptake present in the mid inferolateral location(s) that is reversible. There is abnormal wall motion in the defect area. Consistent with ischemia.   No ST deviation was noted. The ECG was negative for ischemia.   Left  ventricular function is normal. Nuclear stress EF: 65%. The left ventricular ejection fraction is normal (55-65%). End diastolic cavity size is normal. End systolic cavity size is normal. No evidence of transient ischemic dilation (TID) noted.   CT images were obtained for attenuation correction and were examined for the presence of coronary calcium  when appropriate.   Coronary calcium  assessment not performed due to prior revascularization.   Event monitor 05/04/2020 Atrial fibrillation, average heart rate 81, 3.1-second pause, <1% PVCs   Echocardiogram  04/10/24 IMPRESSIONS     1. Left ventricular ejection fraction, by estimation, is 55 to 60%. The  left ventricle has normal function. The left ventricle has no regional  wall motion abnormalities. There is mild asymmetric left ventricular  hypertrophy of the basal-septal segment.  Left ventricular diastolic parameters are consistent with Grade II  diastolic dysfunction (pseudonormalization).   2. Right ventricular systolic function is normal. The right ventricular  size is normal. There is normal pulmonary artery systolic pressure. The  estimated right ventricular systolic pressure is 23.8 mmHg.   3. Left atrial size was severely dilated.   4. The mitral valve is normal in structure. Mild mitral valve  regurgitation. No evidence of mitral stenosis.   5. The aortic valve is tricuspid. There is mild calcification of the  aortic valve. Aortic valve regurgitation is not visualized. No aortic  stenosis is present.   6. Aortic dilatation noted. There is mild dilatation of the aortic  root,  measuring 38 mm. There is mild dilatation of the ascending aorta,  measuring 38 mm.   7. The inferior vena cava is normal in size with <50% respiratory  variability, suggesting right atrial pressure of 8 mmHg.   History of Present Illness:    78 y.o. f/u CAD/CABG August 26 2020 see above Has done well post op echo with normal EF and aortic root 4.3 cm  CTA 07/26/21 aorta only 3.8 cm He has had PAF and been on flecainide  but this was stopped when his CAD diagnosed Has been on eliquis  Had MAZE And LAA clipping during CABG   Sold his Joseph Larsen Like to make wild [place] Moonshine  No palpitations and no bleeding issues on eliquis    Had gynecomastia likely from aldactone  d/c and changed to inspra. Has f/u with BP clinic and pressures good on current regimen  He knows when he has had PAF in past He does not like to be on ASA/Eliquis  as he bruises too easily Discussed fact that ASA more important for grafts. He usually knows when he's in afib and we discussed Kardia monitor Eliquis  D/c since he had LAA clipping.   CTA 08/17/22 Ascending thoracic aorta only 3.5 cm   Summer 2023 went to [place] for his grand son's wedding  No angina compliant with meds Seen by DOD Joseph Larsen and PA April/May 2025 for dyspnea   TTE 04/10/24 EF 55-60% ? Garde 2 diastolic dysfunction. Normal RV severe LAE mild MR AV sclerosis and aortic root 3.8 cm Myovue 04/12/24 ? Very small single segment area of ? Ishcemia in the mid inferior lateral wall thought best to Rx medically with just dyspnea and no angina. EF 65%  Pro NT BNP was normal Maintained on losartan  100 mg, imdur  30 mg , inspra 25 mg , atenolol  12.5 mg, ASA/statin  CT abdomen ? Hemorrhagic cycst in upper pole left kidney F/U MRI multiphase MRI or CT recommended in November by radiology.    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   CT in 07/2021: 3.8 cm // Chest/aorta CTA: Ascending aorta 3.5 cm, descending aorta 3.5 cm   BPH (benign prostatic hyperplasia)    Carotid artery disease (HCC)    US  9/21: bilat ICA 1-39    Coronary artery disease cardiologist--- dr delford   nuclear stress test showed low risk no ischemia 05-29-2020;  coronary CTA 07-21-2020 cal score 3159 with disease;  cath 08-07-2020 severe multiple cad;  08-26-2020 S/p CABG  x4  and MAZE procedure  LIMA -LAD, SVG -Dx, SVG -RI, SVG -OM)   Depression    Diverticular disease    Dyspnea on exertion    Echocardiogram    Echocardiogram 12/21: EF 60-65, no RWMA, mod asymmetric basal-septal LVH, normal diastolic fn, normal RVSF, RVSP 26, mild MR, mild AV sclerosis (no AS), dilated aortic root (43 mm), dilated ascending aorta (43 mm), no effusion    ETOH abuse    First degree heart block    GERD (gastroesophageal reflux disease)    Gout    Joseph pt last episode 2019   Hemorrhoids    Hiatal hernia    History of 2019 novel coronavirus disease (COVID-19)    11-27-2019 positive result in care everywhere, Joseph pt mild symptoms that resolved   HTN (hypertension)    Hyperlipidemia 09/10/2020   Low testosterone     PAF (paroxysmal atrial fibrillation) (HCC)  cardiologist--- dr delford--- first dx 08/ 2019   failed DCCV in 6/21 x2   // Flecainide  Rx >>DC'd 2/2 CAD // s/p Maze + LAA clipping in 9/21 // Apixaban    Palpitations    PTSD (post-traumatic stress disorder)    [place] [employer] - [employer]   Right inguinal hernia    S/P Maze operation for atrial fibrillation 08/26/2020   at same time cabg    Current Medications: Current Meds  Medication Sig   acetaminophen  (TYLENOL ) 500 MG tablet Take 1,000 mg by mouth daily as needed for moderate pain or headache.   ALPRAZolam  (XANAX ) 0.5 MG tablet Take 0.25 mg by mouth at bedtime.    aspirin  EC 81 MG tablet Take 1 tablet (81 mg total) by mouth daily. Swallow whole.   atenolol  (TENORMIN ) 25 MG tablet Take 0.5 tablets (12.5 mg total) by mouth daily.   atorvastatin  (LIPITOR) 40 MG tablet Take 1 tablet (40 mg total) by mouth daily.   Cholecalciferol (VITAMIN D ) 50 MCG (2000 UT) tablet Take 2,000 Units by mouth daily.   Cyanocobalamin  (B-12 PO) Take 2 tablets by mouth 2 (two) times daily.   eplerenone (INSPRA) 25 MG tablet Take 25 mg by mouth daily.   fluticasone  (FLONASE ) 50 MCG/ACT nasal spray Place 2 sprays into both nostrils 2 (two) times  daily as needed for allergies or rhinitis.   isosorbide  mononitrate (IMDUR ) 30 MG 24 hr tablet Take 1 tablet (30 mg total) by mouth daily.   loratadine  (CLARITIN ) 10 MG tablet Take 10 mg by mouth daily.   losartan  (COZAAR ) 100 MG tablet Take 1 tablet (100 mg total) by mouth daily.   nitroGLYCERIN  (NITROSTAT ) 0.4 MG SL tablet nitroglycerin  0.4 mg sublingual tablet DISSOLVE ONE TABLET UNDER THE TONGUE EVERY 5 MINUTES AS NEEDED FOR CHEST PAIN. DO NOT EXCEED A TOTAL OF 3 DOSES IN 15 MINUTES   Omega-3 Fatty Acids (FISH OIL) 1000 MG CAPS Take 1,000 mg by mouth daily.   omeprazole (PRILOSEC) 20 MG capsule Take 20 mg by mouth 2 (two) times daily as needed (for heartburn / acid reflux).    QUEtiapine  (SEROQUEL ) 100 MG tablet Take 25 mg by mouth at bedtime. Pt only takes 1/4 tab   tamsulosin  (FLOMAX ) 0.4 MG CAPS capsule TAKE 1 CAPSULE BY MOUTH ONCE DAILY AFTER BREAKFAST     Allergies:   Terazosin, Enalapril maleate, Lisinopril, Penicillins, and Adhesive [tape]   Social History   Tobacco Use   Smoking status: Former    Current packs/day: 0.00    Average packs/day: 3.0 packs/day for 20.0 years (59.9 ttl pk-yrs)    Types: Cigarettes    Start date: 02/09/1965    Quit date: 05/12/1977    Years since quitting: 47.1   Smokeless tobacco: Never  Vaping Use   Vaping status: Never Used  Substance Use Topics   Alcohol use: Not Currently    Alcohol/week: 7.0 - 14.0 standard drinks of alcohol    Types: 7 - 14 Cans of beer Joseph week    Comment: 03-23-2021 Joseph pt 1 to 2 beers daily (hx alcohol abuse   Drug use: Never     Family Hx: The patient's family history includes Heart disease in his father and mother; Heart murmur in his sister; Hypertension in his brother, sister, sister, sister, and sister; Liver disease in his brother. There is no history of Colon cancer.  ROS   EKGs/Labs/Other Test Reviewed:    EKG:   07/04/2024 SB rate 57 ICRBBB voltage for LVH  Recent  Labs: 04/16/2024: ALT 19; NT-Pro BNP  293 05/01/2024: BUN 10; Creatinine, Ser 0.84; Potassium 4.1; Sodium 141   Recent Lipid Panel Lab Results  Component Value Date/Time   CHOL 77 (L) 11/18/2020 10:05 AM   TRIG 37 11/18/2020 10:05 AM   TRIG 82 03/25/2015 09:50 AM   HDL 32 (L) 11/18/2020 10:05 AM   HDL 38 (L) 03/25/2015 09:50 AM   CHOLHDL 2.4 11/18/2020 10:05 AM   LDLCALC 34 11/18/2020 10:05 AM   LDLCALC 54 07/17/2014 08:28 AM   LDLDIRECT 74 01/03/2017 11:00 AM      Risk Assessment/Calculations:     CHA2DS2-VASc Score = 4  This indicates a 4.8% annual risk of stroke. The patient's score is based upon: CHF History: 0 HTN History: 1 Diabetes History: 0 Stroke History: 0 Vascular Disease History: 1 Age Score: 2 Gender Score: 0     Physical Exam:    VS:  BP 124/82 (BP Location: Left Arm)   Pulse (!) 55   Ht 5' 11 (1.803 m)   Wt 189 lb 9.6 oz (86 kg)   SpO2 97%   BMI 26.44 kg/m     Wt Readings from Last 3 Encounters:  07/04/24 189 lb 9.6 oz (86 kg)  05/17/24 194 lb 12.8 oz (88.4 kg)  05/01/24 189 lb 3.2 oz (85.8 kg)     Affect appropriate Healthy:  appears stated age HEENT: normal Neck supple with no adenopathy JVP normal no bruits no thyromegaly Lungs clear with no wheezing and good diaphragmatic motion Heart:  S1/S2 no murmur, no rub, gallop or click PMI normal post sternotomy  Abdomen: benighn, BS positve, no tenderness, no AAA no bruit.  No HSM or HJR Distal pulses intact with no bruits No edema Neuro non-focal Skin warm and dry No muscular weakness    ASSESSMENT & PLAN:    1. Essential hypertension - improved on current meds including Inspra, atenolol , imdur , cozaar  - low sodium diet   2. CAD/CABG. -08/26/20 doing well with preserved EF resume ASA 81 mg  Beta blocker and statin Myovue 04/12/24 low risk single segment reversable Defect in inferior lateral wall Medical Rx normal EF 65%   4. PAF -Maintaining sinus rhythm.  He is status post Maze procedure and atrial appendage  clipping.  D/C eliquis  Kartia monitoring   5. Hyperlipidemia, unspecified hyperlipidemia type -LDL optimal on most recent lab work.  Continue current Rx.    6. Dilated Aorta:  3.8 cm stable by TTE 04/10/24  7. Gynecomastia:  :  improved off aldactone    8. Dyspnea: does not appear cardiac with normal EF by TTE 04/10/24 and myovue 04/12/24. Maintaining NSR ? Diastolic dysfunction on diuretic Inspra.BNP normal   9. Renal:  f/u MRI/CT Joseph primary for hemorrhagic cyst in LUP.      Dispo:  F/U in a year   Medication Adjustments/Labs and Tests Ordered: Current medicines are reviewed at length with the patient today.  Concerns regarding medicines are outlined above.  Tests Ordered: No orders of the defined types were placed in this encounter.  Medication Changes: No orders of the defined types were placed in this encounter.   Signed, Joseph Emmer, MD  07/04/2024 9:13 AM    [facility] [address] [place], [place], [place]  [address] Phone: [phone]; Fax: [phone]

## 2024-07-04 ENCOUNTER — Ambulatory Visit: Attending: Cardiovascular Disease | Admitting: Cardiovascular Disease

## 2024-07-04 ENCOUNTER — Encounter: Payer: Self-pay | Admitting: Cardiovascular Disease

## 2024-07-04 VITALS — BP 124/82 | HR 55 | Ht 71.0 in | Wt 189.6 lb

## 2024-07-04 DIAGNOSIS — I77819 Aortic ectasia, unspecified site: Secondary | ICD-10-CM | POA: Diagnosis not present

## 2024-07-04 DIAGNOSIS — I10 Essential (primary) hypertension: Secondary | ICD-10-CM | POA: Insufficient documentation

## 2024-07-04 DIAGNOSIS — I480 Paroxysmal atrial fibrillation: Secondary | ICD-10-CM | POA: Diagnosis not present

## 2024-07-04 DIAGNOSIS — Z951 Presence of aortocoronary bypass graft: Secondary | ICD-10-CM | POA: Insufficient documentation

## 2024-07-04 NOTE — Patient Instructions (Signed)
 Medication Instructions:  Your physician recommends that you continue on your current medications as directed. Please refer to the Current Medication list given to you today.   Labwork: None today  Testing/Procedures: None today  Follow-Up: 6 months  Any Other Special Instructions Will Be Listed Below (If Applicable).  If you need a refill on your cardiac medications before your next appointment, please call your pharmacy.

## 2024-07-05 DIAGNOSIS — L814 Other melanin hyperpigmentation: Secondary | ICD-10-CM | POA: Diagnosis not present

## 2024-07-05 DIAGNOSIS — Z85828 Personal history of other malignant neoplasm of skin: Secondary | ICD-10-CM | POA: Diagnosis not present

## 2024-07-05 DIAGNOSIS — L821 Other seborrheic keratosis: Secondary | ICD-10-CM | POA: Diagnosis not present

## 2024-07-05 DIAGNOSIS — L570 Actinic keratosis: Secondary | ICD-10-CM | POA: Diagnosis not present

## 2024-07-05 DIAGNOSIS — D225 Melanocytic nevi of trunk: Secondary | ICD-10-CM | POA: Diagnosis not present

## 2024-07-05 DIAGNOSIS — D1801 Hemangioma of skin and subcutaneous tissue: Secondary | ICD-10-CM | POA: Diagnosis not present

## 2024-07-11 ENCOUNTER — Encounter (INDEPENDENT_AMBULATORY_CARE_PROVIDER_SITE_OTHER): Payer: Self-pay

## 2024-07-16 ENCOUNTER — Other Ambulatory Visit: Payer: Self-pay | Admitting: Medical Genetics

## 2024-07-18 ENCOUNTER — Other Ambulatory Visit (HOSPITAL_COMMUNITY)
Admission: RE | Admit: 2024-07-18 | Discharge: 2024-07-18 | Disposition: A | Discharge status: Home / Self Care | Payer: Self-pay | Source: Ambulatory Visit | Attending: Medical Genetics | Admitting: Medical Genetics

## 2024-07-28 LAB — GENECONNECT MOLECULAR SCREEN: Genetic Analysis Overall Interpretation: NEGATIVE

## 2024-09-04 ENCOUNTER — Ambulatory Visit (INDEPENDENT_AMBULATORY_CARE_PROVIDER_SITE_OTHER)

## 2024-09-04 VITALS — BP 124/82 | HR 55 | Ht 71.0 in | Wt 189.0 lb

## 2024-09-04 DIAGNOSIS — Z0000 Encounter for general adult medical examination without abnormal findings: Secondary | ICD-10-CM | POA: Diagnosis not present

## 2024-09-04 DIAGNOSIS — Z1211 Encounter for screening for malignant neoplasm of colon: Secondary | ICD-10-CM

## 2024-09-04 NOTE — Progress Notes (Signed)
 Subjective:   Joseph Larsen is a 78 y.o. who presents for a Medicare Wellness preventive visit.  As a reminder, Annual Wellness Visits don't include a physical exam, and some assessments may be limited, especially if this visit is performed virtually. We may recommend an in-person follow-up visit with your provider if needed.  Visit Complete: Virtual I connected with  ILARIO DHALIWAL on 09/04/24 by a audio enabled telemedicine application and verified that I am speaking with the correct person using two identifiers.  Patient Location: Home  Provider Location: Home Office  I discussed the limitations of evaluation and management by telemedicine. The patient expressed understanding and agreed to proceed.  Vital Signs: Because this visit was a virtual/telehealth visit, some criteria may be missing or patient reported. Any vitals not documented were not able to be obtained and vitals that have been documented are patient reported.  VideoDeclined- This patient declined Librarian, academic. Therefore the visit was completed with audio only.  Persons Participating in Visit: Patient.  AWV Questionnaire: Yes: Patient Medicare AWV questionnaire was completed, 08/29/24, prior to this visit.        Objective:    Today's Vitals   09/04/24 1321  BP: 124/82  Pulse: (!) 55  Weight: 189 lb (85.7 kg)  Height: 5' 11 (1.803 m)   Body mass index is 26.36 kg/m.     09/04/2024   12:54 PM 12/23/2022    2:06 PM 08/31/2021    2:52 PM 03/29/2021   10:21 AM 08/26/2020   11:00 PM 08/20/2020   11:11 AM 08/07/2020    9:14 AM  Advanced Directives  Does Patient Have a Medical Advance Directive? Yes Yes Yes Yes Yes  Yes  Type of Estate agent of [place];Living will Healthcare Power of [place];Living will Healthcare Power of [place];Living will Healthcare Power of [place];Living will Healthcare Power of [place];Living will Healthcare Power of [place];Living  will Healthcare Power of Attorney  Does patient want to make changes to medical advance directive?    No - Patient declined No - Patient declined    Copy of Healthcare Power of Attorney in Chart?  No - copy requested No - copy requested  No - copy requested No - copy requested No - copy requested    Current Medications (verified) Outpatient Encounter Medications as of 09/04/2024  Medication Sig   acetaminophen  (TYLENOL ) 500 MG tablet Take 1,000 mg by mouth daily as needed for moderate pain or headache.   ALPRAZolam  (XANAX ) 0.5 MG tablet Take 0.25 mg by mouth at bedtime.    aspirin  EC 81 MG tablet Take 1 tablet (81 mg total) by mouth daily. Swallow whole.   atenolol  (TENORMIN ) 25 MG tablet Take 0.5 tablets (12.5 mg total) by mouth daily.   atorvastatin  (LIPITOR) 40 MG tablet Take 1 tablet (40 mg total) by mouth daily.   Cholecalciferol (VITAMIN D ) 50 MCG (2000 UT) tablet Take 2,000 Units by mouth daily.   Cyanocobalamin  (B-12 PO) Take 2 tablets by mouth 2 (two) times daily.   eplerenone (INSPRA) 25 MG tablet Take 25 mg by mouth daily.   fluticasone  (FLONASE ) 50 MCG/ACT nasal spray Place 2 sprays into both nostrils 2 (two) times daily as needed for allergies or rhinitis.   isosorbide  mononitrate (IMDUR ) 30 MG 24 hr tablet Take 1 tablet (30 mg total) by mouth daily.   loratadine  (CLARITIN ) 10 MG tablet Take 10 mg by mouth daily.   losartan  (COZAAR ) 100 MG tablet Take 1 tablet (  100 mg total) by mouth daily.   nitroGLYCERIN  (NITROSTAT ) 0.4 MG SL tablet nitroglycerin  0.4 mg sublingual tablet DISSOLVE ONE TABLET UNDER THE TONGUE EVERY 5 MINUTES AS NEEDED FOR CHEST PAIN. DO NOT EXCEED A TOTAL OF 3 DOSES IN 15 MINUTES   Omega-3 Fatty Acids (FISH OIL) 1000 MG CAPS Take 1,000 mg by mouth daily.   omeprazole (PRILOSEC) 20 MG capsule Take 20 mg by mouth 2 (two) times daily as needed (for heartburn / acid reflux).    QUEtiapine  (SEROQUEL ) 100 MG tablet Take 25 mg by mouth at bedtime. Pt only takes 1/4 tab    tamsulosin  (FLOMAX ) 0.4 MG CAPS capsule TAKE 1 CAPSULE BY MOUTH ONCE DAILY AFTER BREAKFAST   No facility-administered encounter medications on file as of 09/04/2024.    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   CT in 07/2021: 3.8 cm // Chest/aorta CTA: Ascending aorta 3.5 cm, descending aorta 3.5 cm   BPH (benign prostatic hyperplasia)    Carotid artery disease    US  9/21: bilat ICA 1-39    Cataract 03/21/2024  04/04/2024   Coronary artery disease cardiologist--- dr delford   nuclear stress test showed low risk no ischemia 05-29-2020;  coronary CTA 07-21-2020 cal score 3159 with disease;  cath 08-07-2020 severe multiple cad;  08-26-2020 S/p CABG x4  and MAZE procedure  LIMA -LAD, SVG -Dx, SVG -RI, SVG -OM)   Depression    Diverticular disease    Dyspnea on exertion    Echocardiogram    Echocardiogram 12/21: EF 60-65, no RWMA, mod asymmetric basal-septal LVH, normal diastolic fn, normal RVSF, RVSP 26, mild MR, mild AV sclerosis (no AS), dilated aortic root (43 mm), dilated ascending aorta (43 mm), no effusion    ETOH abuse    First degree heart block    GERD (gastroesophageal reflux disease)    Gout    per pt last episode 2019   Hemorrhoids    Hiatal hernia    History of 2019 novel coronavirus disease (COVID-19)    11-27-2019 positive result in care everywhere, per pt mild symptoms that resolved   HTN (hypertension)    Hyperlipidemia 09/10/2020   Low testosterone     PAF (paroxysmal atrial fibrillation) [facility]) cardiologist--- dr delford--- first dx 08/ 2019   failed DCCV in 6/21 x2   // Flecainide  Rx >>DC'd 2/2 CAD // s/p Maze + LAA clipping in 9/21 // Apixaban    Palpitations    PTSD (post-traumatic stress disorder)    [place] V[employer] - [employer]   Right inguinal hernia    S/P Maze operation  for atrial fibrillation 08/26/2020   at same time cabg   Past Surgical History:  Procedure Laterality Date   CARDIOVERSION N/A 05/15/2020   Procedure: CARDIOVERSION;  Surgeon: Shlomo Wilbert SAUNDERS, MD;  Location[facility] ENDOSCOPY;  Service: Cardiovascular;  Laterality: N/A;   CARDIOVERSION N/A 06/01/2020   Procedure: CARDIOVERSION;  Surgeon: Loni Soyla LABOR, MD;  Location: [facility] ENDOSCOPY;  Service: Cardiovascular;  Laterality: N/A;   CLIPPING OF ATRIAL APPENDAGE N/A 08/26/2020   Procedure: CLIPPING OF ATRIAL APPENDAGE;  Surgeon: Lucas Dorise POUR, MD;  Location: [facility] OR;  Service: Open Heart Surgery;  Laterality: N/A;   CORONARY ARTERY BYPASS GRAFT N/A 08/26/2020   Procedure: CORONARY ARTERY BYPASS GRAFTING TIMES FOUR USING LEFT INTERNAL MAMMARY  ARTERY AND ENDOSCOPICALLY HARVESTED RIGHT GREATER SAPHENOUS VEIN;  Surgeon: Lucas Dorise POUR, MD;  Location: [facility] OR;  Service: Open Heart Surgery;  Laterality: N/A;   ESOPHAGOGASTRODUODENOSCOPY  last one 03-24-2020  @[facility]    EYE SURGERY     HERNIA REPAIR  03/29/2021   INGUINAL HERNIA REPAIR Right 03/29/2021   Procedure: OPEN REPAIR RIGHT INGUINAL HERNIA WITH MESH;  Surgeon: Eletha Boas, MD;  Location: [facility];  Service: General;  Laterality: Right;  60 MINUTES   KNEE ARTHROSCOPY Bilateral 2010;  2012   LAPAROSCOPIC CHOLECYSTECTOMY  08-31-2005  @[facility]    LEFT HEART CATH AND CORONARY ANGIOGRAPHY N/A 08/07/2020   Procedure: LEFT HEART CATH AND CORONARY ANGIOGRAPHY;  Surgeon: Swaziland, Peter M, MD;  Location: [facility] INVASIVE CV LAB;  Service: Cardiovascular;  Laterality: N/A;   MAZE N/A 08/26/2020   Procedure: MAZE;  Surgeon: Lucas Dorise POUR, MD;  Location: [facility] OR;  Service: Open Heart Surgery;  Laterality: N/A;   SHOULDER ARTHROSCOPY Right 01/14/2020   TEE WITHOUT CARDIOVERSION N/A 08/26/2020   Procedure: TRANSESOPHAGEAL ECHOCARDIOGRAM (TEE);  Surgeon: Lucas Dorise POUR, MD;  Location: [facility] OR;  Service: Open Heart Surgery;  Laterality: N/A;   Family History   Problem Relation Age of Onset   Heart disease Mother        died of chf   Heart disease Father        died of old age   Hypertension Sister    Liver disease Brother    Hypertension Brother    Hypertension Sister    Hypertension Sister    Hypertension Sister    Heart murmur Sister    Colon cancer Neg Hx    Social History   Socioeconomic History   Marital status: Married    Spouse name: Shona   Number of children: 1   Years of education: Not on file   Highest education level: 7th grade  Occupational History   Occupation: disabled    Associate Professor: RETIRED    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  Tobacco Use   Smoking status: Former    Current packs/day: 0.00    Average packs/day: 3.0 packs/day for 20.0 years (59.9 ttl pk-yrs)    Types: Cigarettes    Start date: 02/09/1965    Quit date: 05/12/1977    Years since quitting: 47.3   Smokeless tobacco: Never  Vaping Use   Vaping status: Never Used  Substance and Sexual Activity   Alcohol use: Yes    Alcohol/week: 7.0 - 14.0 standard drinks of alcohol    Types: 7 - 14 Cans of beer per week    Comment: 03-23-2021 per pt 1 to 2 beers daily (hx alcohol abuse   Drug use: Never   Sexual activity: Not Currently  Other Topics Concern   Not on file  Social History Narrative   Lives with wife in [place] in a one story home.  Exercises @ gym 3x/wk - stationary bike and light weight training.   Social Drivers of Corporate investment banker Strain: Low Risk  (08/29/2024)   Overall Financial Resource Strain (CARDIA)    Difficulty of Paying Living Expenses: Not hard at all  Food Insecurity: No Food Insecurity (08/29/2024)   Hunger Vital Sign    Worried About Running Out of Food in the Last Year: Never true    Ran Out of Food in the Last Year: Never true  Transportation Needs: No Transportation Needs (08/29/2024)   PRAPARE - Administrator, Civil Service (  Medical): No    Lack of Transportation (Non-Medical): No   Physical Activity: Sufficiently Active (08/29/2024)   Exercise Vital Sign    Days of Exercise per Week: 3 days    Minutes of Exercise per Session: 60 min  Stress: No Stress Concern Present (08/29/2024)   [employer] of Occupational Health - Occupational Stress Questionnaire    Feeling of Stress: Only a little  Social Connections: Socially Isolated (08/29/2024)   Social Connection and Isolation Panel    Frequency of Communication with Friends and Family: Never    Frequency of Social Gatherings with Friends and Family: Twice a week    Attends Religious Services: Never    Database administrator or Organizations: No    Attends Engineer, structural: Not on file    Marital Status: Married    Tobacco Counseling Counseling given: Yes    Clinical Intake:  Pre-visit preparation completed: Yes  Pain : No/denies pain     BMI - recorded: 26.36 Nutritional Status: BMI 25 -29 Overweight Nutritional Risks: None Diabetes: No  Lab Results  Component Value Date   HGBA1C 5.0 08/20/2020     How often do you need to have someone help you when you read instructions, pamphlets, or other written materials from your doctor or pharmacy?: 1 - Never  Interpreter Needed?: No  Information entered by :: Alia t/cma   Activities of Daily Living     08/29/2024   11:40 AM  In your present state of health, do you have any difficulty performing the following activities:  Hearing? 0  Vision? 0  Walking or climbing stairs? 0  Dressing or bathing? 0  Doing errands, shopping? 0  Preparing Food and eating ? N  Using the Toilet? N  In the past six months, have you accidently leaked urine? N  Do you have problems with loss of bowel control? N  Managing your Medications? N  Managing your Finances? N  Housekeeping or managing your Housekeeping? N    Patient Care Team: Jolinda Norene HERO, DO as PCP - General (Family Medicine) Delford Maude BROCKS, MD as PCP - Cardiology  (Cardiology) Aneita Gwendlyn DASEN, MD (Inactive) as Consulting Physician (Gastroenterology) [place]abel PARAS, MD as Consulting Physician (Gastroenterology)  I have updated your Care Teams any recent Medical Services you may have received from other providers in the past year.     Assessment:   This is a routine wellness examination for [place].  Hearing/Vision screen Hearing Screening - Comments:: Pt denies hearing dif Vision Screening - Comments:: Pt denies vision dif/last ov 6mos ago   Goals Addressed             This Visit's Progress    Exercise 3x per week (30 min per time)   On track      Depression Screen     09/04/2024   12:54 PM 05/01/2024    8:46 AM 12/23/2022    2:06 PM 12/15/2022    9:29 AM 03/01/2022   10:04 AM 10/04/2021    2:08 PM 08/31/2021    2:44 PM  PHQ 2/9 Scores  PHQ - 2 Score 0 0 0 0 0 0 0  PHQ- 9 Score  1 0 0  0     Fall Risk     08/29/2024   11:40 AM 05/01/2024    8:46 AM 12/23/2022    2:04 PM 12/15/2022    9:29 AM 03/01/2022   10:04 AM  Fall Risk   Falls in  the past year? 1 0 0 0 0  Number falls in past yr: 1  0    Injury with Fall? 0  0    Risk for fall due to :   No Fall Risks    Follow up   Falls prevention discussed        Data saved with a previous flowsheet row definition    MEDICARE RISK AT HOME:  Medicare Risk at Home Any stairs in or around the home?: (Patient-Rptd) Yes If so, are there any without handrails?: (Patient-Rptd) No Home free of loose throw rugs in walkways, pet beds, electrical cords, etc?: (Patient-Rptd) Yes Adequate lighting in your home to reduce risk of falls?: (Patient-Rptd) Yes Life alert?: (Patient-Rptd) No Use of a cane, walker or w/c?: (Patient-Rptd) No Grab bars in the bathroom?: (Patient-Rptd) No Shower chair or bench in shower?: (Patient-Rptd) No Elevated toilet seat or a handicapped toilet?: (Patient-Rptd) No  TIMED UP AND GO:  Was the test performed?  no  Cognitive Function: 6CIT completed     01/09/2018   10:46 AM 01/03/2017   10:44 AM 01/19/2016    9:18 AM  MMSE - Mini Mental State Exam  Orientation to time 5  5  4    Orientation to Place 5  5  5    Registration 3  3  3    Attention/ Calculation 5  5  2    Recall 3  3  3    Language- name 2 objects 2  2  2    Language- repeat 1 1 1   Language- follow 3 step command 3  3  3    Language- read & follow direction 1  1  1    Write a sentence 1  1  1    Copy design 0  1  1   Total score 29  30  26       Data saved with a previous flowsheet row definition        09/04/2024   12:56 PM 12/23/2022    2:07 PM  6CIT Screen  What Year? 0 points 0 points  What month? 0 points 0 points  What time? 0 points 0 points  Count back from 20 0 points 0 points  Months in reverse 0 points 0 points  Repeat phrase 0 points 0 points  Total Score 0 points 0 points    Immunizations Immunization History  Administered Date(s) Administered   Fluad Quad(high Dose 65+) 10/14/2020   Influenza Split 11/18/2011, 11/11/2012   Influenza,inj,Quad PF,6+ Mos 10/02/2013   Influenza,inj,quad, With Preservative 09/12/2019   Influenza-Unspecified 10/13/2015, 09/09/2016, 09/26/2017, 09/12/2018, 09/17/2019   Moderna Sars-Covid-2 Vaccination 01/17/2020, 02/14/2020, 10/07/2020   PNEUMOCOCCAL CONJUGATE-20 08/17/2021   Pneumococcal Conjugate-13 03/03/2014   Pneumococcal Polysaccharide-23 12/13/2011, 06/01/2012   Tdap 04/11/2009   Zoster Recombinant(Shingrix ) 11/14/2018   Zoster, Live 01/13/2012    Screening Tests Health Maintenance  Topic Date Due   Zoster Vaccines- Shingrix  (2 of 2) 01/09/2019   DTaP/Tdap/Td (2 - Td or Tdap) 04/12/2019   Colonoscopy  06/25/2024   Influenza Vaccine  07/12/2024   COVID-19 Vaccine (4 - 2025-26 season) 08/12/2024   Medicare Annual Wellness (AWV)  09/04/2025   Pneumococcal Vaccine: 50+ Years  Completed   Hepatitis C Screening  Completed   HPV VACCINES  Aged Out   Meningococcal B Vaccine  Aged Out    Health Maintenance Items  Addressed: See Nurse Notes at the end of this note  Additional Screening:  Vision Screening: Recommended annual ophthalmology exams for early detection  of glaucoma and other disorders of the eye. Is the patient up to date with their annual eye exam?  Yes  Who is the provider or what is the name of the office in which the patient attends annual eye exams? N/a  Dental Screening: Recommended annual dental exams for proper oral hygiene  Community Resource Referral / Chronic Care Management: CRR required this visit?  No   CCM required this visit?  No   Plan:    I have personally reviewed and noted the following in the patient's chart:   Medical and social history Use of alcohol, tobacco or illicit drugs  Current medications and supplements including opioid prescriptions. Patient is not currently taking opioid prescriptions. Functional ability and status Nutritional status Physical activity Advanced directives List of other physicians Hospitalizations, surgeries, and ER visits in previous 12 months Vitals Screenings to include cognitive, depression, and falls Referrals and appointments  In addition, I have reviewed and discussed with patient certain preventive protocols, quality metrics, and best practice recommendations. A written personalized care plan for preventive services as well as general preventive health recommendations were provided to patient.   Ozie Ned, CMA   09/04/2024   After Visit Summary: (MyChart) Due to this being a telephonic visit, the after visit summary with patients personalized plan was offered to patient via MyChart   Notes: Pt is aware due the following: DTAP, Shingles-2nd inj., colonoscopy-ordered, flu vaccine

## 2024-09-04 NOTE — Patient Instructions (Signed)
 [name],  Thank you for taking the time for your Medicare Wellness Visit. I appreciate your continued commitment to your health goals. Please review the care plan we discussed, and feel free to reach out if I can assist you further.  Medicare recommends these wellness visits once per year to help you and your care team stay ahead of potential health issues. These visits are designed to focus on prevention, allowing your provider to concentrate on managing your acute and chronic conditions during your regular appointments.  Please note that Annual Wellness Visits do not include a physical exam. Some assessments may be limited, especially if the visit was conducted virtually. If needed, we may recommend a separate in-person follow-up with your provider.  Ongoing Care Seeing your primary care provider every 3 to 6 months helps us  monitor your health and provide consistent, personalized care.   Referrals If a referral was made during today's visit and you haven't received any updates within two weeks, please contact the referred provider directly to check on the status.  Recommended Screenings:  Health Maintenance  Topic Date Due   Zoster (Shingles) Vaccine (2 of 2) 01/09/2019   DTaP/Tdap/Td vaccine (2 - Td or Tdap) 04/12/2019   Medicare Annual Wellness Visit  12/24/2023   Colon Cancer Screening  06/25/2024   Flu Shot  07/12/2024   COVID-19 Vaccine (4 - 2025-26 season) 08/12/2024   Pneumococcal Vaccine for age over 23  Completed   Hepatitis C Screening  Completed   HPV Vaccine  Aged Out   Meningitis B Vaccine  Aged Out       09/04/2024   12:54 PM  Advanced Directives  Does Patient Have a Medical Advance Directive? Yes  Type of Estate agent of [place];Living will   Advance Care Planning is important because it: Ensures you receive medical care that aligns with your values, goals, and preferences. Provides guidance to your family and loved ones, reducing the  emotional burden of decision-making during critical moments.  Vision: Annual vision screenings are recommended for early detection of glaucoma, cataracts, and diabetic retinopathy. These exams can also reveal signs of chronic conditions such as diabetes and high blood pressure.  Dental: Annual dental screenings help detect early signs of oral cancer, gum disease, and other conditions linked to overall health, including heart disease and diabetes.  Please see the attached documents for additional preventive care recommendations.

## 2024-09-19 LAB — LAB REPORT - SCANNED
A1c: 5.5
EGFR: 89
TSH: 1.35 (ref 0.41–5.90)

## 2024-11-04 ENCOUNTER — Telehealth: Payer: Self-pay | Admitting: Family Medicine

## 2024-11-04 ENCOUNTER — Ambulatory Visit: Payer: Self-pay | Admitting: Family Medicine

## 2024-11-04 ENCOUNTER — Encounter: Payer: Self-pay | Admitting: Family Medicine

## 2024-11-04 VITALS — BP 135/71 | HR 54 | Temp 97.3°F | Ht 71.0 in | Wt 189.0 lb

## 2024-11-04 DIAGNOSIS — N2889 Other specified disorders of kidney and ureter: Secondary | ICD-10-CM

## 2024-11-04 DIAGNOSIS — Z1211 Encounter for screening for malignant neoplasm of colon: Secondary | ICD-10-CM

## 2024-11-04 DIAGNOSIS — N281 Cyst of kidney, acquired: Secondary | ICD-10-CM

## 2024-11-04 DIAGNOSIS — R5383 Other fatigue: Secondary | ICD-10-CM

## 2024-11-04 NOTE — Telephone Encounter (Signed)
 Copied from CRM #[id number]. Topic: Clinical - Medical Advice >> Nov 04, 2024  3:25 PM Nathanel BROCKS wrote: Reason for CRM: pt returned call about MRI needing to be scheduled. Please call pt back to follow up,

## 2024-11-04 NOTE — Patient Instructions (Addendum)
 You are OVERDUE for  Colonoscopy, you may call  at: [phone] to set up visit.  [facility] and please drop a copy of your labs by.

## 2024-11-04 NOTE — Telephone Encounter (Signed)
 Left vm for pt to call back. Pt has MRI scheduled for 11/13/24. Not sure if he scheduled this or if [facility] did. If this appt does not work for pt he will need to contact Zelda Salmon to reschedule

## 2024-11-04 NOTE — Progress Notes (Signed)
 Subjective: CC: Follow-up renal cyst PCP: Jolinda Norene HERO, DO YEP:Rzrpo E Elamin is a 78 y.o. male presenting to clinic today for:  Patient was seen in May for a solid mass in the internal pole cortical medial region of the left kidney 2.6 x 2.7 cm consistent with a solid renal mass incidentally on myocardial perfusion test.  He was subsequently ordered a CT abdomen pelvis which demonstrated a 3.1 cm hemorrhagic cyst of the left upper pole the left kidney.  It was recommended that he has renal protocol MRI or CT of the abdomen in 6 months.  He presents today to get this set up.  He denies any hematuria, change in urine output, unplanned weight loss, night sweats, abdominal pain or flank pain.  He maintains a fairly active and balanced lifestyle.  He is accompanied today by his wife.  He does report some intermittent fatigue but thinks that this is more so to do with laziness and/or overexertion.     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   CT in 07/2021: 3.8 cm // Chest/aorta CTA: Ascending aorta 3.5 cm, descending aorta 3.5 cm   BPH (benign prostatic hyperplasia)    Carotid artery disease    US  9/21: bilat ICA 1-39    Cataract 03/21/2024  04/04/2024   Coronary artery disease cardiologist--- dr delford   nuclear stress test showed low risk no ischemia 05-29-2020;  coronary CTA 07-21-2020 cal score 3159 with disease;  cath 08-07-2020 severe multiple cad;  08-26-2020 S/p CABG x4  and MAZE procedure  LIMA -LAD, SVG -Dx, SVG -RI, SVG -OM)   Depression    Diverticular disease    Dyspnea on exertion    Echocardiogram    Echocardiogram 12/21: EF 60-65, no RWMA, mod  asymmetric basal-septal LVH, normal diastolic fn, normal RVSF, RVSP 26, mild MR, mild AV sclerosis (no AS), dilated aortic root (43 mm), dilated ascending aorta (43 mm), no effusion    ETOH abuse    First degree heart block    GERD (gastroesophageal reflux disease)    Gout    per pt last episode 2019   Hemorrhoids    Hiatal hernia    History of 2019 novel coronavirus disease (COVID-19)    11-27-2019 positive result in care everywhere, per pt mild symptoms that resolved   HTN (hypertension)    Hyperlipidemia 09/10/2020   Low testosterone     PAF (paroxysmal atrial fibrillation) [facility]) cardiologist--- dr delford--- first dx 08/ 2019   failed DCCV in 6/21 x2   // Flecainide  Rx >>DC'd 2/2 CAD // s/p Maze + LAA clipping in 9/21 // Apixaban    Palpitations    PTSD (post-traumatic stress disorder)    [place] [employer]   Right inguinal hernia    S/P Maze operation for atrial fibrillation 08/26/2020   at same time cabg    Current Outpatient Medications:    acetaminophen  (TYLENOL ) 500 MG tablet, Take 1,000 mg by mouth daily as needed for moderate pain or headache., Disp: , Rfl:    ALPRAZolam  (XANAX )  0.5 MG tablet, Take 0.25 mg by mouth at bedtime. , Disp: , Rfl:    aspirin  EC 81 MG tablet, Take 1 tablet (81 mg total) by mouth daily. Swallow whole., Disp: 90 tablet, Rfl: 3   atenolol  (TENORMIN ) 25 MG tablet, Take 0.5 tablets (12.5 mg total) by mouth daily., Disp: 45 tablet, Rfl: 3   atorvastatin  (LIPITOR) 40 MG tablet, Take 1 tablet (40 mg total) by mouth daily., Disp: 30 tablet, Rfl: 11   Cyanocobalamin  (B-12 PO), Take 2 tablets by mouth 2 (two) times daily., Disp: , Rfl:    eplerenone (INSPRA) 25 MG tablet, Take 25 mg by mouth daily., Disp: , Rfl:    fluticasone  (FLONASE ) 50 MCG/ACT nasal spray, Place 2 sprays into both nostrils 2 (two) times daily as needed for allergies or rhinitis., Disp: , Rfl:    isosorbide  mononitrate (IMDUR ) 30 MG 24 hr tablet, Take 1 tablet (30 mg total) by mouth daily.,  Disp: 90 tablet, Rfl: 3   loratadine  (CLARITIN ) 10 MG tablet, Take 10 mg by mouth daily., Disp: , Rfl:    losartan  (COZAAR ) 100 MG tablet, Take 1 tablet (100 mg total) by mouth daily., Disp: 90 tablet, Rfl: 3   nitroGLYCERIN  (NITROSTAT ) 0.4 MG SL tablet, nitroglycerin  0.4 mg sublingual tablet DISSOLVE ONE TABLET UNDER THE TONGUE EVERY 5 MINUTES AS NEEDED FOR CHEST PAIN. DO NOT EXCEED A TOTAL OF 3 DOSES IN 15 MINUTES, Disp: 25 tablet, Rfl: 3   omeprazole (PRILOSEC) 20 MG capsule, Take 20 mg by mouth 2 (two) times daily as needed (for heartburn / acid reflux). , Disp: , Rfl:    QUEtiapine  (SEROQUEL ) 100 MG tablet, Take 25 mg by mouth at bedtime. Pt only takes 1/4 tab, Disp: , Rfl:    tamsulosin  (FLOMAX ) 0.4 MG CAPS capsule, TAKE 1 CAPSULE BY MOUTH ONCE DAILY AFTER BREAKFAST, Disp: 90 capsule, Rfl: 0   Cholecalciferol (VITAMIN D ) 50 MCG (2000 UT) tablet, Take 2,000 Units by mouth daily. (Patient not taking: Reported on 11/04/2024), Disp: , Rfl:    Omega-3 Fatty Acids (FISH OIL) 1000 MG CAPS, Take 1,000 mg by mouth daily. (Patient not taking: Reported on 11/04/2024), Disp: , Rfl:  Social History   Socioeconomic History   Marital status: Married    Spouse name: Shona   Number of children: 1   Years of education: Not on file   Highest education level: 7th grade  Occupational History   Occupation: disabled    Associate Professor: RETIRED    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  Tobacco Use   Smoking status: Former    Current packs/day: 0.00    Average packs/day: 3.0 packs/day for 20.0 years (59.9 ttl pk-yrs)    Types: Cigarettes    Start date: 02/09/1965    Quit date: 05/12/1977    Years since quitting: 47.5   Smokeless tobacco: Never  Vaping Use   Vaping status: Never Used  Substance and Sexual Activity   Alcohol use: Yes    Alcohol/week: 7.0 - 14.0 standard drinks of alcohol    Types: 7 - 14 Cans of beer per week    Comment: 03-23-2021 per pt 1 to 2 beers daily (hx alcohol abuse   Drug  use: Never   Sexual activity: Not Currently  Other Topics Concern   Not on file  Social History Narrative   Lives with wife in [place] in a one story home.  Exercises @ gym 3x/wk - stationary bike and light weight training.   Social Drivers of Health  Financial Resource Strain: Low Risk  (11/01/2024)   Overall Financial Resource Strain (CARDIA)    Difficulty of Paying Living Expenses: Not hard at all  Food Insecurity: No Food Insecurity (11/01/2024)   Hunger Vital Sign    Worried About Running Out of Food in the Last Year: Never true    Ran Out of Food in the Last Year: Never true  Transportation Needs: No Transportation Needs (11/01/2024)   PRAPARE - Administrator, Civil Service (Medical): No    Lack of Transportation (Non-Medical): No  Physical Activity: Insufficiently Active (11/01/2024)   Exercise Vital Sign    Days of Exercise per Week: 2 days    Minutes of Exercise per Session: 30 min  Stress: Stress Concern Present (11/01/2024)   [employer] of Occupational Health - Occupational Stress Questionnaire    Feeling of Stress: To some extent  Social Connections: Socially Isolated (11/01/2024)   Social Connection and Isolation Panel    Frequency of Communication with Friends and Family: Once a week    Frequency of Social Gatherings with Friends and Family: Once a week    Attends Religious Services: Never    Database Administrator or Organizations: No    Attends Engineer, Structural: Not on file    Marital Status: Married  Catering Manager Violence: Not At Risk (09/04/2024)   Humiliation, Afraid, Rape, and Kick questionnaire    Fear of Current or Ex-Partner: No    Emotionally Abused: No    Physically Abused: No    Sexually Abused: No   Family History  Problem Relation Age of Onset   Heart disease Mother        died of chf   Heart disease Father        died of old age   Hypertension Sister    ALS Sister    Hypertension Sister     Hypertension Sister    Hypertension Sister    Heart murmur Sister    Liver disease Brother    Hypertension Brother    Colon cancer Neg Hx     Objective: Office vital signs reviewed. BP 135/71   Pulse (!) 54   Temp (!) 97.3 F (36.3 C)   Ht 5' 11 (1.803 m)   Wt 189 lb (85.7 kg)   SpO2 95%   BMI 26.36 kg/m   Physical Examination:  General: Awake, alert, well nourished, No acute distress HEENT: Sclera white.  Moist mucous membranes. Cardio: Lightly bradycardic with regular rhythm, S1S2 heard, no murmurs appreciated Pulm: clear to auscultation bilaterally, no wheezes, rhonchi or rales; normal work of breathing on room air GI: soft, non-tender, non-distended, bowel sounds present x4, no hepatomegaly, no splenomegaly, no masses GU: No suprapubic tenderness palpation.  No CVA tenderness to palpation.  Narrative & Impression  EXAMINATION: CT ABDOMEN PELVIS W CONTRAST   CLINICAL INDICATION: Male, 78 years old. Renal mass/cyst, indeterminate; Solid mass in the internal pole cortical medial region of the left kidney 2.6 x 2.7 cm consistent with a solid renal mass in the renal cell carcinoma needs to be excluded. Correlation with CT with contrast recommended.- incidential finding on myocardial perfusion test   TECHNIQUE: Axial CT of the abdomen and pelvis with 100 cc Omnipaque  300 intravenous contrast. Multiplanar reformations provided. Unless otherwise specified, incidental thyroid , adrenal, renal lesions do not require dedicated imaging follow up. Additionally, any mentioned pulmonary nodules do not require dedicated imaging follow-up based on the Fleischner guidelines unless otherwise specified. Coronary calcifications  are not identified unless otherwise specified.   COMPARISON:   FINDINGS:   The lung bases are clear. The heart is probably enlarged. There are coronary calcifications. Small hiatal hernia. The liver contains a calcified granuloma. The gallbladder is  surgically absent. The spleen is normal. The pancreas is normal. Duodenal diverticulum noted. Ventricles are normal. There is right renal cortical scarring. There are bilateral renal cysts including a 3.1 cm hemorrhagic cyst in the upper pole of the left kidney. No suspicious enhancing renal lesions.   The abdominal aorta is normal in caliber. Scattered atherosclerotic changes are present. The bladder is normal. The prostate is enlarged. It is colonic diverticulosis. Large and small bowel loops are otherwise within normal limits. No free fluid or adenopathy. There are degenerative changes of the spine and bony pelvis.   IMPRESSION:   Bilateral renal cysts including a 3.1 cm hemorrhagic cyst in the upper pole of the left kidney, which is classified as a Bosniak 32F cyst due to size. Follow-up renal protocol multiphase MRI or CT of the abdomen is recommended in 6 months.   DOSE REDUCTION: This exam was performed according to our departmental dose-optimization program which includes automated exposure control, adjustment of the mA and/or kV according to patient size and/or use of iterative reconstruction technique.   Electronically signed by: Chad Engel MD 05/01/2024 02:12 PM EDT RP Workstation: [id number]    Assessment/ Plan: 78 y.o. male   Hemorrhage of cyst of native kidney - Plan: MR Abdomen W Wo Contrast  Other specified disorders of kidney and ureter - Plan: MR Abdomen W Wo Contrast  Fatigue, unspecified type  Screen for colon cancer   3.1 cm hemorrhagic cyst noted in the left upper pole of the kidney that was found back in May.  47-month follow-up recommended and I have ordered renal protocol MRI of the abdomen with and without contrast to further investigate this lesion.  Further interventions pending results  Having some fatigue of uncertain etiology.  Previously had sleep study performed which was negative.  Had labs collected by PCP at [facility] and his wife will get those brought to  me We will be glad to further investigate if I see any areas of opportunity but I suspect that the intermittent fatigue is likely due to age and overexertion.  Plan for tetanus shot here in office but his insurance requires that he has this at an outpatient pharmacy.  He will get screening colon imaging done at [facility].  I gave him information on how to obtain that   Norene CHRISTELLA Fielding, DO [facility] [phone]

## 2024-11-13 ENCOUNTER — Ambulatory Visit (HOSPITAL_COMMUNITY)
Admission: RE | Admit: 2024-11-13 | Discharge: 2024-11-13 | Disposition: A | Source: Ambulatory Visit | Attending: Family Medicine

## 2024-11-13 DIAGNOSIS — K449 Diaphragmatic hernia without obstruction or gangrene: Secondary | ICD-10-CM | POA: Diagnosis not present

## 2024-11-13 DIAGNOSIS — N2889 Other specified disorders of kidney and ureter: Secondary | ICD-10-CM | POA: Insufficient documentation

## 2024-11-13 DIAGNOSIS — K5730 Diverticulosis of large intestine without perforation or abscess without bleeding: Secondary | ICD-10-CM | POA: Diagnosis not present

## 2024-11-13 DIAGNOSIS — N281 Cyst of kidney, acquired: Secondary | ICD-10-CM | POA: Insufficient documentation

## 2024-11-13 DIAGNOSIS — D1809 Hemangioma of other sites: Secondary | ICD-10-CM | POA: Diagnosis not present

## 2024-11-13 MED ORDER — GADOBUTROL 1 MMOL/ML IV SOLN
8.0000 mL | Freq: Once | INTRAVENOUS | Status: AC | PRN
  Administered 2024-11-13: 8 mL via INTRAVENOUS

## 2024-11-15 NOTE — Telephone Encounter (Signed)
 Imaging completed 11/13/2024

## 2024-11-18 ENCOUNTER — Ambulatory Visit: Payer: Self-pay | Admitting: Family Medicine

## 2024-11-18 DIAGNOSIS — K449 Diaphragmatic hernia without obstruction or gangrene: Secondary | ICD-10-CM

## 2024-11-18 DIAGNOSIS — N281 Cyst of kidney, acquired: Secondary | ICD-10-CM

## 2024-11-22 ENCOUNTER — Encounter: Payer: Self-pay | Admitting: Cardiovascular Disease

## 2024-11-22 ENCOUNTER — Ambulatory Visit: Admitting: Nurse Practitioner

## 2024-11-22 ENCOUNTER — Encounter: Payer: Self-pay | Admitting: Nurse Practitioner

## 2024-11-22 VITALS — BP 160/75 | HR 57 | Temp 97.7°F | Ht 71.0 in | Wt 194.0 lb

## 2024-11-22 DIAGNOSIS — J40 Bronchitis, not specified as acute or chronic: Secondary | ICD-10-CM

## 2024-11-22 MED ORDER — AZITHROMYCIN 250 MG PO TABS: ORAL_TABLET | ORAL | 0 refills | Status: DC

## 2024-11-22 MED ORDER — BENZONATATE 100 MG PO CAPS: 100.0000 mg | ORAL_CAPSULE | Freq: Two times a day (BID) | ORAL | 0 refills | Status: DC | PRN

## 2024-11-22 NOTE — Progress Notes (Signed)
 Subjective:    Patient ID: Joseph Larsen, male    DOB: 1946-11-21, 78 y.o.   MRN: [id number]   Chief Complaint: Cough and chest congestion   Cough This is a new problem. The current episode started in the past 7 days. The problem occurs every few minutes. The cough is Productive of sputum. Associated symptoms include nasal congestion, rhinorrhea and a sore throat. Pertinent negatives include no chills, ear congestion, ear pain, fever or shortness of breath. Exacerbated by: laying down. He has tried OTC cough suppressant (corcidin) for the symptoms. The treatment provided mild relief.    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   CAD (coronary artery disease) 08/07/2020   Persistent atrial fibrillation (HCC)    Tear of right rotator cuff 06/20/2019   Aortic atherosclerosis 03/01/2018   Barrett's esophagus 01/03/2017   Non-healing ulcer (HCC) 10/13/2015   Neoplasm of uncertain behavior of skin 10/08/2015   Skin lesion 09/07/2015   Hypokalemia 03/03/2014   BPH (benign prostatic hyperplasia) 03/03/2014   Chest pain    Palpitations    Dyspnea on exertion    ETOH abuse    PTSD (post-traumatic stress disorder) 10/02/2013   Abnormal stress test 08/29/2012   GERD (gastroesophageal reflux disease)    Essential hypertension    Anxiety    Depression    Allergic rhinitis    Diverticular disease    Hemorrhoids    Hiatal hernia    Gout    Deficiency anemia 08/09/2010   NONSPEC ELEVATION OF LEVELS OF TRANSAMINASE/LDH 08/09/2010   NONSPECIFIC ABNORMAL FINDING IN STOOL CONTENTS 08/09/2010       Review of Systems  Constitutional:  Negative for chills and fever.  HENT:   Positive for rhinorrhea and sore throat. Negative for ear pain.   Respiratory:  Positive for cough. Negative for shortness of breath.        Objective:   Physical Exam Constitutional:      Appearance: Normal appearance.  HENT:     Right Ear: Tympanic membrane normal.     Left Ear: Tympanic membrane normal.     Nose: Congestion and rhinorrhea present.     Mouth/Throat:     Pharynx: No oropharyngeal exudate or posterior oropharyngeal erythema.  Cardiovascular:     Rate and Rhythm: Normal rate and regular rhythm.     Heart sounds: Normal heart sounds.     Friction rub: deep barky cough.  Pulmonary:     Breath sounds: Normal breath sounds.  Skin:    General: Skin is warm.  Neurological:     General: No focal deficit present.     Mental Status: He is alert and oriented to person, place, and time.  Psychiatric:        Mood and Affect: Mood normal.        Behavior: Behavior normal.     BP (!) 160/75   Pulse (!) 57   Temp 97.7 F (36.5 C) (Temporal)   Ht 5' 11 (1.803 m)   Wt 194 lb (88 kg)   SpO2 95%   BMI 27.06 kg/m        Assessment & Plan:  Joseph Larsen in today with chief complaint  of Cough and chest congestion   1. Bronchitis (Primary) 1. Take meds as prescribed 2. Use a cool mist humidifier especially during the winter months and when heat has been humid. 3. Use saline nose sprays frequently 4. Saline irrigations of the nose can be very helpful if done frequently.  * 4X daily for 1 week*  * Use of a nettie pot can be helpful with this. Follow directions with this* 5. Drink plenty of fluids 6. Keep thermostat turn down low 7.For any cough or congestion- tessalon  perles 8. For fever or aces or pains- take tylenol  or ibuprofen appropriate for age and weight.  * for fevers greater than 101 orally you may alternate ibuprofen and tylenol  every  3 hours.   Meds ordered this encounter  Medications   azithromycin  (ZITHROMAX  Z-PAK) 250 MG tablet    Sig: As  directed    Dispense:  6 tablet    Refill:  0    Supervising Provider:   GOTTSCHALK, ASHLY M [[id number]]   benzonatate  (TESSALON ) 100 MG capsule    Sig: Take 1 capsule (100 mg total) by mouth 2 (two) times daily as needed for cough.    Dispense:  20 capsule    Refill:  0    Supervising Provider:   JOLINDA NORENE HERO [[id number]]       The above assessment and management plan was discussed with the patient. The patient verbalized understanding of and has agreed to the management plan. Patient is aware to call the clinic if symptoms persist or worsen. Patient is aware when to return to the clinic for a follow-up visit. Patient educated on when it is appropriate to go to the emergency department.   Mary-Margaret Gladis, FNP

## 2024-11-22 NOTE — Patient Instructions (Signed)

## 2024-11-25 ENCOUNTER — Encounter: Payer: Self-pay | Admitting: Gastroenterology

## 2024-11-27 ENCOUNTER — Encounter: Payer: Self-pay | Admitting: Internal Medicine

## 2024-12-25 ENCOUNTER — Encounter: Payer: Self-pay | Admitting: Internal Medicine

## 2024-12-25 ENCOUNTER — Ambulatory Visit (INDEPENDENT_AMBULATORY_CARE_PROVIDER_SITE_OTHER): Admitting: Internal Medicine

## 2024-12-25 VITALS — BP 134/75 | HR 65 | Temp 97.8°F | Ht 71.0 in | Wt 193.3 lb

## 2024-12-25 DIAGNOSIS — K219 Gastro-esophageal reflux disease without esophagitis: Secondary | ICD-10-CM

## 2024-12-25 NOTE — Progress Notes (Signed)
 Patient presented for new patient evaluation today.  Has an appointment to see [facility] 01/01/2025.  Discussed with him and his wife today that I think he really only needs to establish with 1 gastroenterologist.  He reports he has always gotten his care at [facility] and would like to stay with them.  I am certainly happy to help out any way locally that I can.  Otherwise follow-up as needed.  No charge for today's visit.

## 2025-01-01 ENCOUNTER — Ambulatory Visit: Admitting: Gastroenterology

## 2025-01-01 ENCOUNTER — Encounter: Payer: Self-pay | Admitting: Gastroenterology

## 2025-01-01 VITALS — BP 116/60 | HR 76 | Ht 69.5 in | Wt 191.4 lb

## 2025-01-01 DIAGNOSIS — K31A21 Gastric intestinal metaplasia with low grade dysplasia: Secondary | ICD-10-CM | POA: Insufficient documentation

## 2025-01-01 DIAGNOSIS — K219 Gastro-esophageal reflux disease without esophagitis: Secondary | ICD-10-CM | POA: Diagnosis not present

## 2025-01-01 DIAGNOSIS — Z860100 Personal history of colon polyps, unspecified: Secondary | ICD-10-CM | POA: Insufficient documentation

## 2025-01-01 MED ORDER — NA SULFATE-K SULFATE-MG SULF 17.5-3.13-1.6 GM/177ML PO SOLN: 1.0000 | Freq: Once | ORAL | 0 refills | Status: AC

## 2025-01-01 NOTE — Progress Notes (Signed)
 "    01/01/2025 Joseph Larsen [id number] 08/30/1946   Discussed the use of AI scribe software for clinical note transcription with the patient, who gave verbal consent to proceed.  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  He was previously a patient of Dr. Albin.  His care is being assigned to Dr. Wilhelmenia.  He is due for repeat colonoscopy, with the last performed in July 2022 and a recommended three-year interval. No issues with bowel movements or visible blood in the stool.  Intermittent heartburn and reflux occur approximately every two weeks, maybe triggered by spicy foods. Omeprazole 20 mg is taken daily, sometimes twice daily as prescribed, but often only once daily, with symptomatic relief reported.  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  Had several EGDs there with the last in October 2023 showing healthy-appearing scar tissue at a prior resection site and normal mapping biopsies so repeat EGD was recommended in 2 years.  He was previously on Eliquis  but discontinued it approximately two years ago. Baby aspirin  is continued. No abdominal pain is present.  Colonoscopy 06/2021: - One 4 mm polyp in the proximal transverse colon, removed with a cold biopsy forceps. Resected and retrieved. - Two 6 mm polyps in the mid transverse colon and in the distal transverse colon, removed with a cold snare. Resected and retrieved. - Mild diverticulosis in the right colon. - Moderate diverticulosis in the left colon. - Internal hemorrhoids. - The examination was otherwise normal on direct and retroflexion views.  Surgical [P], colon, transverse, polyp (3) - TUBULAR ADENOMA (X3 FRAGMENTS). - NO  HIGH GRADE DYSPLASIA OR MALIGNANCY.  EGD 09/2022 at [facility]:                         - Normal esophagus.                         - Scar in the gastric fundus. Biopsied.                         - Normal stomach. Biopsied.                         - Normal examined duodenum.   A: Stomach, EMR site, biopsy: -Gastric oxyntic and antral type mucosa with moderate chronic, focally active gastritis, glandular atrophy, a rare intestinal metaplastic goblet cell and reactive epithelial changes. -Negative for dysplasia. -No conspicuous evidence of Helicobacter organisms on H&E stain.   B: Stomach, antrum greater curve, biopsy: -Gastric antral mucosa with mild chronic inactive gastritis and reactive epithelial changes. -Negative for intestinal metaplasia and dysplasia. -No conspicuous evidence of Helicobacter organisms on H&E stain.   C: Stomach, antrum lesser curve, biopsy: -Gastric antral mucosa with mild chronic inactive gastritis and reactive epithelial changes. -Negative for intestinal metaplasia and dysplasia. -No conspicuous evidence of Helicobacter organisms on H&E stain.   D: Stomach, incisura, biopsy: -Gastric oxyntic mucosa with mild chronic inactive gastritis and focal incomplete intestinal metaplasia. -Negative for dysplasia. -No conspicuous evidence of Helicobacter organisms on H&E stain.   E: Stomach, fundus greater curve, biopsy: -Gastric oxyntic mucosa  with mild chronic inactive gastritis. -No conspicuous evidence of Helicobacter organisms on H&E stain.   F: Stomach, fundus lesser curve, biopsy: -Gastric oxyntic mucosa with mild chronic inactive gastritis -No conspicuous evidence of Helicobacter organisms on H&E stain.  Repeat recommended in 2 years.     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   CT in 07/2021: 3.8 cm // Chest/aorta CTA: Ascending aorta 3.5 cm, descending aorta 3.5  cm   BPH (benign prostatic hyperplasia)    Carotid artery disease    US  9/21: bilat ICA 1-39    Cataract 03/21/2024  04/04/2024   Coronary artery disease cardiologist--- dr delford   nuclear stress test showed low risk no ischemia 05-29-2020;  coronary CTA 07-21-2020 cal score 3159 with disease;  cath 08-07-2020 severe multiple cad;  08-26-2020 S/p CABG x4  and MAZE procedure  LIMA -LAD, SVG -Dx, SVG -RI, SVG -OM)   Depression    Diverticular disease    Dyspnea on exertion    Echocardiogram    Echocardiogram 12/21: EF 60-65, no RWMA, mod asymmetric basal-septal LVH, normal diastolic fn, normal RVSF, RVSP 26, mild MR, mild AV sclerosis (no AS), dilated aortic root (43 mm), dilated ascending aorta (43 mm), no effusion    ETOH abuse    First degree heart block    GERD (gastroesophageal reflux disease)    Gout    per pt last episode 2019   Hemorrhoids    Hiatal hernia    History of 2019 novel coronavirus disease (COVID-19)    11-27-2019 positive result in care everywhere, per pt mild symptoms that resolved   HTN (hypertension)    Hyperlipidemia 09/10/2020   Low testosterone     PAF (paroxysmal atrial fibrillation) [facility] cardiologist--- dr delford--- first dx 08/ 2019   failed DCCV in 6/21 x2   // Flecainide  Rx >>DC'd 2/2 CAD // s/p Maze + LAA clipping in 9/21 // Apixaban    Palpitations    PTSD (post-traumatic stress disorder)    [place] [employer]   Right inguinal hernia    S/P Maze operation for atrial fibrillation 08/26/2020   at same time cabg   Past Surgical History:  Procedure Laterality Date   CARDIOVERSION N/A 05/15/2020   Procedure: CARDIOVERSION;  Surgeon: Shlomo Wilbert SAUNDERS, MD;  Location: [facility] ENDOSCOPY;  Service: Cardiovascular;  Laterality: N/A;   CARDIOVERSION N/A 06/01/2020   Procedure: CARDIOVERSION;  Surgeon: Loni Soyla LABOR, MD;  Location: [facility] ENDOSCOPY;  Service: Cardiovascular;  Laterality: N/A;   CLIPPING OF ATRIAL APPENDAGE N/A 08/26/2020   Procedure: CLIPPING OF  ATRIAL APPENDAGE;  Surgeon: Lucas Dorise POUR, MD;  Location: [facility] OR;  Service: Open Heart Surgery;  Laterality: N/A;   CORONARY ARTERY BYPASS GRAFT N/A 08/26/2020   Procedure: CORONARY ARTERY BYPASS GRAFTING TIMES FOUR USING LEFT INTERNAL MAMMARY ARTERY AND ENDOSCOPICALLY HARVESTED RIGHT GREATER SAPHENOUS VEIN;  Surgeon: Lucas Dorise POUR, MD;  Location: [facility] OR;  Service: Open Heart Surgery;  Laterality: N/A;   ESOPHAGOGASTRODUODENOSCOPY  last one 03-24-2020  @[facility]    EYE SURGERY     HERNIA REPAIR  03/29/2021   INGUINAL HERNIA REPAIR Right 03/29/2021   Procedure: OPEN REPAIR RIGHT INGUINAL HERNIA WITH MESH;  Surgeon: Eletha Boas, MD;  Location: [facility] [place];  Service: General;  Laterality: Right;  60 MINUTES   KNEE ARTHROSCOPY Bilateral 2010;  2012   LAPAROSCOPIC CHOLECYSTECTOMY  08-31-2005  @  [facility]   LEFT HEART CATH AND CORONARY ANGIOGRAPHY N/A 08/07/2020   Procedure: LEFT HEART CATH AND CORONARY ANGIOGRAPHY;  Surgeon: Jordan, Peter M, MD;  Location: [facility] INVASIVE CV LAB;  Service: Cardiovascular;  Laterality: N/A;   MAZE N/A 08/26/2020   Procedure: MAZE;  Surgeon: Lucas Dorise POUR, MD;  Location: [facility] OR;  Service: Open Heart Surgery;  Laterality: N/A;   SHOULDER ARTHROSCOPY Right 01/14/2020   TEE WITHOUT CARDIOVERSION N/A 08/26/2020   Procedure: TRANSESOPHAGEAL ECHOCARDIOGRAM (TEE);  Surgeon: Lucas Dorise POUR, MD;  Location: [facility] OR;  Service: Open Heart Surgery;  Laterality: N/A;    reports that he quit smoking about 47 years ago. His smoking use included cigarettes. He started smoking about 59 years ago. He has a 59.9 pack-year smoking history. He has never used smokeless tobacco. He reports current alcohol use of about 7.0 - 14.0 standard drinks of alcohol per week. He reports that he does not use drugs. family history includes ALS in his sister; Heart disease in his father and mother; Heart murmur in his sister; Hypertension in his brother, sister, sister, sister, and sister; Liver disease in  his brother. Allergies[1]    Outpatient Encounter Medications as of 01/01/2025  Medication Sig   acetaminophen  (TYLENOL ) 500 MG tablet Take 1,000 mg by mouth daily as needed for moderate pain or headache.   ALPRAZolam  (XANAX ) 0.5 MG tablet Take 0.25 mg by mouth at bedtime.    aspirin  EC 81 MG tablet Take 1 tablet (81 mg total) by mouth daily. Swallow whole.   atenolol  (TENORMIN ) 25 MG tablet Take 0.5 tablets (12.5 mg total) by mouth daily.   atorvastatin  (LIPITOR) 40 MG tablet Take 1 tablet (40 mg total) by mouth daily.   Carboxymethylcellulose Sod PF 0.5 % SOLN Apply 1 drop to eye 4 (four) times daily.   Carboxymethylcellulose Sod PF 1 % GEL Apply 1 drop to eye at bedtime.   Cholecalciferol (VITAMIN D ) 50 MCG (2000 UT) tablet Take 2,000 Units by mouth daily.   Cyanocobalamin  (B-12 PO) Take 2 tablets by mouth 2 (two) times daily.   eplerenone (INSPRA) 25 MG tablet Take 25 mg by mouth daily.   fluticasone  (FLONASE ) 50 MCG/ACT nasal spray Place 2 sprays into both nostrils 2 (two) times daily as needed for allergies or rhinitis.   isosorbide  mononitrate (IMDUR ) 30 MG 24 hr tablet Take 1 tablet (30 mg total) by mouth daily.   loratadine  (CLARITIN ) 10 MG tablet Take 10 mg by mouth daily.   losartan  (COZAAR ) 100 MG tablet Take 1 tablet (100 mg total) by mouth daily.   nitroGLYCERIN  (NITROSTAT ) 0.4 MG SL tablet nitroglycerin  0.4 mg sublingual tablet DISSOLVE ONE TABLET UNDER THE TONGUE EVERY 5 MINUTES AS NEEDED FOR CHEST PAIN. DO NOT EXCEED A TOTAL OF 3 DOSES IN 15 MINUTES   Omega-3 Fatty Acids (FISH OIL) 1000 MG CAPS Take 1,000 mg by mouth every other day.   omeprazole (PRILOSEC) 20 MG capsule Take 20 mg by mouth 2 (two) times daily as needed (for heartburn / acid reflux).    QUEtiapine  (SEROQUEL ) 100 MG tablet Take 25 mg by mouth at bedtime. Pt only takes 1/4 tab   tamsulosin  (FLOMAX ) 0.4 MG CAPS capsule TAKE 1 CAPSULE BY MOUTH ONCE DAILY AFTER BREAKFAST   No facility-administered encounter  medications on file as of 01/01/2025.     REVIEW OF SYSTEMS  : All other systems reviewed and negative except where noted in the History of Present Illness.   PHYSICAL EXAM: BP 116/60 (  BP Location: Left Arm, Patient Position: Sitting, Cuff Size: Normal)   Pulse 76   Ht 5' 9.5 (1.765 m) Comment: height measured without shoes  Wt 191 lb 6 oz (86.8 kg)   BMI 27.86 kg/m  General: Well developed white male in no acute distress Head: Normocephalic and atraumatic Eyes:  Sclerae anicteric, conjunctiva pink. Ears: Normal auditory acuity Lungs: Clear throughout to auscultation; no W/R/R. Heart: Regular rate and rhythm; no M/R/G. Abdomen: Soft, non-distended.  BS present.  Non-tender. Musculoskeletal: Symmetrical with no gross deformities  Skin: No lesions on visible extremities Extremities: No edema  Neurological: Alert oriented x 4, grossly non-focal Psychological:  Alert and cooperative. Normal mood and affect  Assessment & Plan History of colon polyps He is due for repeat colonoscopy at the recommended three-year interval, with no gastrointestinal symptoms or contraindications to the procedure. - Scheduled colonoscopy for colorectal cancer screening with Dr. Wilhelmenia.  Gastroesophageal reflux disease He experiences intermittent, mild heartburn and reflux, well controlled with omeprazole, without alarm symptoms. - Continue omeprazole daily, with option to increase to twice daily as needed for symptom control.  History of gastric intestinal metaplasia low grade dysplasia Previously resected gastric lesion with low grade dysplasia, under surveillance; most recent endoscopy in 09/2022 at [facility] showed healthy scar tissue and normal biopsies, without recurrence or progression. - Scheduled upper endoscopy (EGD) for surveillance per prior recommendations, 2 year surveillance was recommended.  Likely will need gastric mapping.  **The risks, benefits, and alternatives to EGD and colonoscopy  were discussed with the patient and he consents to proceed.    CC:  Jolinda Potter M, DO       [1]  Allergies Allergen Reactions   Terazosin Rash    Rash when in sun   Enalapril Maleate Rash   Lisinopril Itching and Rash    Hytrin   Penicillins Itching and Rash    35 years ago    Adhesive [Tape] Rash    bandaids    "

## 2025-01-01 NOTE — Patient Instructions (Signed)
 You have been scheduled for an endoscopy and colonoscopy. Please follow the written instructions given to you at your visit today.  If you use inhalers (even only as needed), please bring them with you on the day of your procedure.  DO NOT TAKE 7 DAYS PRIOR TO TEST- Trulicity (dulaglutide) Ozempic, Wegovy (semaglutide) Mounjaro, Zepbound (tirzepatide) Bydureon Bcise (exanatide extended release)  DO NOT TAKE 1 DAY PRIOR TO YOUR TEST Rybelsus (semaglutide) Adlyxin (lixisenatide) Victoza (liraglutide) Byetta (exanatide) ___________________________________________________________________________  _______________________________________________________  If your blood pressure at your visit was 140/90 or greater, please contact your primary care physician to follow up on this.  _______________________________________________________  If you are age 61 or older, your body mass index should be between 23-30. Your Body mass index is 27.86 kg/m. If this is out of the aforementioned range listed, please consider follow up with your Primary Care Provider.  If you are age 68 or younger, your body mass index should be between 19-25. Your Body mass index is 27.86 kg/m. If this is out of the aformentioned range listed, please consider follow up with your Primary Care Provider.   ________________________________________________________  The [facility] providers would like to encourage you to use MYCHART to communicate with providers for non-urgent requests or questions.  Due to long hold times on the telephone, sending your provider a message by [facility] may be a faster and more efficient way to get a response.  Please allow 48 business hours for a response.  Please remember that this is for non-urgent requests.  _______________________________________________________  [facility] is using a team-based approach to care.  Your team is made up of your doctor and two to three APPS. Our APPS  (Nurse Practitioners and Physician Assistants) work with your physician to ensure care continuity for you. They are fully qualified to address your health concerns and develop a treatment plan. They communicate directly with your gastroenterologist to care for you. Seeing the Advanced Practice Practitioners on your physician's team can help you by facilitating care more promptly, often allowing for earlier appointments, access to diagnostic testing, procedures, and other specialty referrals.

## 2025-01-02 NOTE — Progress Notes (Signed)
"   Attending Physician's Attestation   I have reviewed the chart.   I agree with the Advanced Practitioner's note, impression, and recommendations with any updates as below. Reasonable for patient to have both upper and lower endoscopy in the setting of overdue surveillance for GIM and for colon polyp surveillance.  Gastric mapping to be performed for GIM.   Aloha Finner, MD [facility] Advanced Endoscopy Office # [phone]  "

## 2025-02-04 ENCOUNTER — Ambulatory Visit: Admitting: Cardiovascular Disease

## 2025-02-26 ENCOUNTER — Encounter: Admitting: Gastroenterology

## 2025-09-05 ENCOUNTER — Ambulatory Visit: Payer: Self-pay

## 2025-11-05 ENCOUNTER — Encounter: Admitting: Family Medicine
